# Patient Record
Sex: Male | Born: 1965 | Race: Black or African American | Hispanic: No | Marital: Single | Smoking: Former smoker
Health system: Southern US, Community
[De-identification: ages and names within clinical notes are randomized; demographics above are authoritative.]

## PROBLEM LIST (undated history)

## (undated) DIAGNOSIS — E119 Type 2 diabetes mellitus without complications: Secondary | ICD-10-CM

## (undated) DIAGNOSIS — Z720 Tobacco use: Secondary | ICD-10-CM

## (undated) DIAGNOSIS — F1410 Cocaine abuse, uncomplicated: Secondary | ICD-10-CM

## (undated) DIAGNOSIS — F32A Depression, unspecified: Secondary | ICD-10-CM

## (undated) DIAGNOSIS — F329 Major depressive disorder, single episode, unspecified: Secondary | ICD-10-CM

## (undated) DIAGNOSIS — I5020 Unspecified systolic (congestive) heart failure: Secondary | ICD-10-CM

---

## 2011-12-14 ENCOUNTER — Encounter (HOSPITAL_COMMUNITY): Payer: Self-pay | Admitting: Emergency Medicine

## 2011-12-14 ENCOUNTER — Emergency Department (HOSPITAL_COMMUNITY)
Admission: EM | Admit: 2011-12-14 | Discharge: 2011-12-14 | Disposition: A | Discharge status: Home / Self Care | Payer: Self-pay | Attending: Emergency Medicine | Admitting: Emergency Medicine

## 2011-12-14 DIAGNOSIS — E1169 Type 2 diabetes mellitus with other specified complication: Secondary | ICD-10-CM | POA: Insufficient documentation

## 2011-12-14 DIAGNOSIS — R209 Unspecified disturbances of skin sensation: Secondary | ICD-10-CM | POA: Insufficient documentation

## 2011-12-14 DIAGNOSIS — F17200 Nicotine dependence, unspecified, uncomplicated: Secondary | ICD-10-CM | POA: Insufficient documentation

## 2011-12-14 DIAGNOSIS — R739 Hyperglycemia, unspecified: Secondary | ICD-10-CM

## 2011-12-14 DIAGNOSIS — Z79899 Other long term (current) drug therapy: Secondary | ICD-10-CM | POA: Insufficient documentation

## 2011-12-14 HISTORY — DX: Type 2 diabetes mellitus without complications: E11.9

## 2011-12-14 LAB — COMPREHENSIVE METABOLIC PANEL
ALT: 21 U/L (ref 0–53)
AST: 16 U/L (ref 0–37)
Albumin: 3.6 g/dL (ref 3.5–5.2)
Alkaline Phosphatase: 97 U/L (ref 39–117)
BUN: 16 mg/dL (ref 6–23)
CO2: 26 mEq/L (ref 19–32)
Calcium: 9.2 mg/dL (ref 8.4–10.5)
Chloride: 98 mEq/L (ref 96–112)
Creatinine, Ser: 0.99 mg/dL (ref 0.50–1.35)
GFR calc Af Amer: >90 mL/min (ref >90)
GFR calc non Af Amer: >90 mL/min (ref >90)
Glucose, Bld: 396 mg/dL — ABNORMAL HIGH (ref 70–99)
Potassium: 4 mEq/L (ref 3.5–5.1)
Sodium: 132 mEq/L — ABNORMAL LOW (ref 135–145)
Total Bilirubin: 0.3 mg/dL (ref 0.3–1.2)
Total Protein: 7.1 g/dL (ref 6.0–8.3)

## 2011-12-14 LAB — CBC WITH DIFFERENTIAL/PLATELET
Basophils Absolute: 0 10*3/uL (ref 0.0–0.1)
Basophils Relative: 1 % (ref 0–1)
Eosinophils Absolute: 0.1 10*3/uL (ref 0.0–0.7)
Eosinophils Relative: 1 % (ref 0–5)
HCT: 44 % (ref 39.0–52.0)
Hemoglobin: 14.9 g/dL (ref 13.0–17.0)
Lymphocytes Relative: 36 % (ref 12–46)
Lymphs Abs: 1.5 10*3/uL (ref 0.7–4.0)
MCH: 27.3 pg (ref 26.0–34.0)
MCHC: 33.9 g/dL (ref 30.0–36.0)
MCV: 80.6 fL (ref 78.0–100.0)
Monocytes Absolute: 0.2 10*3/uL (ref 0.1–1.0)
Monocytes Relative: 6 % (ref 3–12)
Neutro Abs: 2.4 10*3/uL (ref 1.7–7.7)
Neutrophils Relative %: 57 % (ref 43–77)
Platelets: 201 10*3/uL (ref 150–400)
RBC: 5.46 MIL/uL (ref 4.22–5.81)
RDW: 12.2 % (ref 11.5–15.5)
WBC: 4.3 10*3/uL (ref 4.0–10.5)

## 2011-12-14 LAB — GLUCOSE, CAPILLARY
Glucose-Capillary: 386 mg/dL — ABNORMAL HIGH (ref 70–99)
Glucose-Capillary: 179 mg/dL — ABNORMAL HIGH (ref 70–99)

## 2011-12-14 MED ORDER — SODIUM CHLORIDE 0.9 % IV BOLUS (SEPSIS)
2000.0000 mL | Freq: Once | INTRAVENOUS | Status: AC
  Administered 2011-12-14: 1000 mL via INTRAVENOUS

## 2011-12-14 MED ORDER — METFORMIN HCL 500 MG PO TABS: 500.0000 mg | ORAL_TABLET | Freq: Two times a day (BID) | ORAL | Status: DC

## 2011-12-14 NOTE — ED Notes (Signed)
Pt ran out of diabetes pills 3 days ago, c/o dizziness at present

## 2011-12-14 NOTE — ED Provider Notes (Signed)
History   This chart was scribed for Doug Sou, MD by Leone Payor, ED Scribe. This patient was seen in room TR07C/TR07C and the patient's care was started at 2:56PM.    CSN: [id number]  Arrival date & time 12/14/11  1309   First MD Initiated Contact with Patient 12/14/11 1456      No chief complaint on file.    The history is provided by the patient. No language interpreter was used.    Kerry Hamilton is a 46 y.o. male who presents to the Emergency Department complaining of hyperglycemia with associated dizziness and tingling in the left small toe that is currently improved. CBG level in the ED is 340. Pt reports running out of diabetes medication (metformin 500 mg 2x days) 4 days ago. Pt complains of polydipsia and is negative for weakness, nausea, vomiting. Pt does not have a current PCP in [place].   Pt is a regular smoker and drinks alcohol (beer) 2x weeks.   Past Medical History  Diagnosis Date  . Diabetes mellitus without complication     History reviewed. No pertinent past surgical history.  No family history on file.  History  Substance Use Topics  . Smoking status: Current Some Day Smoker  . Smokeless tobacco: Not on file  . Alcohol Use: Yes      Review of Systems  Constitutional: Negative.   HENT: Negative.   Respiratory: Negative.   Cardiovascular: Negative.   Gastrointestinal: Negative.   Musculoskeletal: Negative.   Skin: Negative.   Neurological: Positive for dizziness and numbness. Negative for weakness.  Hematological: Negative.   Psychiatric/Behavioral: Negative.     Allergies  Review of patient's allergies indicates not on file.  Home Medications   Current Outpatient Rx  Name  Route  Sig  Dispense  Refill  . METFORMIN HCL 500 MG PO TABS   Oral   Take 500 mg by mouth 2 (two) times daily with a meal.           BP 145/80  Pulse 77  Temp 98.3 F (36.8 C)  SpO2 99%  Physical Exam  Nursing note and vitals  reviewed. Constitutional: He appears well-developed and well-nourished.  HENT:  Head: Normocephalic and atraumatic.  Eyes: Conjunctivae normal are normal. Pupils are equal, round, and reactive to light.  Neck: Neck supple. No tracheal deviation present. No thyromegaly present.  Cardiovascular: Normal rate and regular rhythm.   No murmur heard. Pulmonary/Chest: Effort normal and breath sounds normal.  Abdominal: Soft. Bowel sounds are normal. He exhibits no distension. There is no tenderness.  Musculoskeletal: Normal range of motion. He exhibits no edema and no tenderness.  Neurological: He is alert. Coordination normal.  Skin: Skin is warm and dry. No rash noted.  Psychiatric: He has a normal mood and affect.    ED Course  Procedures (including critical care time)  DIAGNOSTIC STUDIES: Oxygen Saturation is 99% on room air, normal by my interpretation.    COORDINATION OF CARE:  3:10 PM Discussed treatment plan which includes prescription for metformin with pt at bedside and pt agreed to plan.   Labs Reviewed  COMPREHENSIVE METABOLIC PANEL - Abnormal; Notable for the following:    Sodium 132 (*)     Glucose, Bld 396 (*)     All other components within normal limits  GLUCOSE, CAPILLARY - Abnormal; Notable for the following:    Glucose-Capillary 386 (*)     All other components within normal limits  CBC WITH DIFFERENTIAL  URINALYSIS, ROUTINE  W REFLEX MICROSCOPIC   No results found.   No diagnosis found.   Results for orders placed during the hospital encounter of 12/14/11  CBC WITH DIFFERENTIAL      Component Value Range   WBC 4.3  4.0 - 10.5 K/uL   RBC 5.46  4.22 - 5.81 MIL/uL   Hemoglobin 14.9  13.0 - 17.0 g/dL   HCT 57.8  46.9 - 62.9 %   MCV 80.6  78.0 - 100.0 fL   MCH 27.3  26.0 - 34.0 pg   MCHC 33.9  30.0 - 36.0 g/dL   RDW 52.8  41.3 - 24.4 %   Platelets 201  150 - 400 K/uL   Neutrophils Relative 57  43 - 77 %   Neutro Abs 2.4  1.7 - 7.7 K/uL   Lymphocytes  Relative 36  12 - 46 %   Lymphs Abs 1.5  0.7 - 4.0 K/uL   Monocytes Relative 6  3 - 12 %   Monocytes Absolute 0.2  0.1 - 1.0 K/uL   Eosinophils Relative 1  0 - 5 %   Eosinophils Absolute 0.1  0.0 - 0.7 K/uL   Basophils Relative 1  0 - 1 %   Basophils Absolute 0.0  0.0 - 0.1 K/uL  COMPREHENSIVE METABOLIC PANEL      Component Value Range   Sodium 132 (*) 135 - 145 mEq/L   Potassium 4.0  3.5 - 5.1 mEq/L   Chloride 98  96 - 112 mEq/L   CO2 26  19 - 32 mEq/L   Glucose, Bld 396 (*) 70 - 99 mg/dL   BUN 16  6 - 23 mg/dL   Creatinine, Ser 0.10  0.50 - 1.35 mg/dL   Calcium 9.2  8.4 - 27.2 mg/dL   Total Protein 7.1  6.0 - 8.3 g/dL   Albumin 3.6  3.5 - 5.2 g/dL   AST 16  0 - 37 U/L   ALT 21  0 - 53 U/L   Alkaline Phosphatase 97  39 - 117 U/L   Total Bilirubin 0.3  0.3 - 1.2 mg/dL   GFR calc non Af Amer >90  >90 mL/min   GFR calc Af Amer >90  >90 mL/min  GLUCOSE, CAPILLARY      Component Value Range   Glucose-Capillary 386 (*) 70 - 99 mg/dL   No results found.  MDM  Elevated sugar and lightheadedness thirst and tingling due to hyperglycemia as a result of medication noncompliance Plan IV hydration prescription metformin 500 mg twice daily, 30 day supply. Recheck CBC and reassess patient prior to discharge. Patient to be transferred to CDU Diagnosis #1 hyperglycemia #2 dehydration #3 medication noncompliance       I personally performed the services described in this documentation, which was scribed in my presence. The recorded information has been reviewed and is accurate.    Doug Sou, MD 12/14/11 1517

## 2011-12-14 NOTE — ED Notes (Signed)
Ran out of sugar meds last week and feeling weak dizziness and tingling in foot

## 2011-12-14 NOTE — ED Provider Notes (Signed)
Patient's blood sugar reduced to 179 after 2 liters of IV fluids.  Patient feeling better now.  Prescription for one month supply of metformin and resource information provided to patient.  Jimmye Norman, NP 12/14/11 2022

## 2011-12-14 NOTE — ED Provider Notes (Signed)
Medical screening examination/treatment/procedure(s) were conducted as a shared visit with non-physician practitioner(s) and myself.  I personally evaluated the patient during the encounter  Doug Sou, MD 12/14/11 2107

## 2013-04-17 ENCOUNTER — Encounter (HOSPITAL_COMMUNITY): Payer: Self-pay | Admitting: Emergency Medicine

## 2013-04-17 ENCOUNTER — Emergency Department (HOSPITAL_COMMUNITY)
Admission: EM | Admit: 2013-04-17 | Discharge: 2013-04-17 | Disposition: A | Discharge status: Home / Self Care | Payer: Medicaid Other | Attending: Emergency Medicine | Admitting: Emergency Medicine

## 2013-04-17 DIAGNOSIS — F329 Major depressive disorder, single episode, unspecified: Secondary | ICD-10-CM | POA: Diagnosis not present

## 2013-04-17 DIAGNOSIS — Z79899 Other long term (current) drug therapy: Secondary | ICD-10-CM | POA: Insufficient documentation

## 2013-04-17 DIAGNOSIS — F3289 Other specified depressive episodes: Secondary | ICD-10-CM | POA: Insufficient documentation

## 2013-04-17 DIAGNOSIS — F17200 Nicotine dependence, unspecified, uncomplicated: Secondary | ICD-10-CM | POA: Insufficient documentation

## 2013-04-17 DIAGNOSIS — E119 Type 2 diabetes mellitus without complications: Secondary | ICD-10-CM | POA: Insufficient documentation

## 2013-04-17 DIAGNOSIS — F32A Depression, unspecified: Secondary | ICD-10-CM

## 2013-04-17 DIAGNOSIS — Z008 Encounter for other general examination: Secondary | ICD-10-CM | POA: Diagnosis present

## 2013-04-17 HISTORY — DX: Depression, unspecified: F32.A

## 2013-04-17 HISTORY — DX: Major depressive disorder, single episode, unspecified: F32.9

## 2013-04-17 LAB — ETHANOL: Alcohol, Ethyl (B): 59 mg/dL — ABNORMAL HIGH (ref 0–11)

## 2013-04-17 LAB — COMPREHENSIVE METABOLIC PANEL
ALT: 33 U/L (ref 0–53)
AST: 28 U/L (ref 0–37)
Albumin: 4.1 g/dL (ref 3.5–5.2)
Alkaline Phosphatase: 88 U/L (ref 39–117)
BUN: 12 mg/dL (ref 6–23)
CO2: 23 mEq/L (ref 19–32)
Calcium: 9.1 mg/dL (ref 8.4–10.5)
Chloride: 102 mEq/L (ref 96–112)
Creatinine, Ser: 0.91 mg/dL (ref 0.50–1.35)
GFR calc Af Amer: >90 mL/min (ref >90)
GFR calc non Af Amer: >90 mL/min (ref >90)
Glucose, Bld: 117 mg/dL — ABNORMAL HIGH (ref 70–99)
Potassium: 4.1 mEq/L (ref 3.7–5.3)
Sodium: 141 mEq/L (ref 137–147)
Total Bilirubin: 0.3 mg/dL (ref 0.3–1.2)
Total Protein: 7.6 g/dL (ref 6.0–8.3)

## 2013-04-17 LAB — CBC
HCT: 43.7 % (ref 39.0–52.0)
Hemoglobin: 14.3 g/dL (ref 13.0–17.0)
MCH: 27.5 pg (ref 26.0–34.0)
MCHC: 32.7 g/dL (ref 30.0–36.0)
MCV: 84 fL (ref 78.0–100.0)
Platelets: 236 10*3/uL (ref 150–400)
RBC: 5.2 MIL/uL (ref 4.22–5.81)
RDW: 13.5 % (ref 11.5–15.5)
WBC: 4.9 10*3/uL (ref 4.0–10.5)

## 2013-04-17 LAB — RAPID URINE DRUG SCREEN, HOSP PERFORMED
Amphetamines: NOT DETECTED
Barbiturates: NOT DETECTED
Benzodiazepines: NOT DETECTED
Cocaine: POSITIVE — AB
Opiates: NOT DETECTED
Tetrahydrocannabinol: NOT DETECTED

## 2013-04-17 LAB — SALICYLATE LEVEL: Salicylate Lvl: <2 mg/dL — ABNORMAL LOW (ref 2.8–20.0)

## 2013-04-17 LAB — ACETAMINOPHEN LEVEL: Acetaminophen (Tylenol), Serum: <15 ug/mL (ref 10–30)

## 2013-04-17 MED ORDER — LURASIDONE HCL 40 MG PO TABS
40.0000 mg | ORAL_TABLET | Freq: Every day | ORAL | Status: DC
  Administered 2013-04-17: 40 mg via ORAL
  Filled 2013-04-17 (×2): qty 1

## 2013-04-17 NOTE — ED Notes (Signed)
Pt was arguing with sister who apparently is on drugs and called 911 stating that he wanted to run out into traffic because he didn't know what to do with himself.

## 2013-04-17 NOTE — ED Notes (Signed)
Patient resting in position of comfort with eyes closed RR WNL--even and unlabored with equal rise and fall of chest Patient in NAD Side rails up, call bell in reach  

## 2013-04-17 NOTE — Discharge Instructions (Signed)
Depression, Adult °Depression is feeling sad, low, down in the dumps, blue, gloomy, or empty. In general, there are two kinds of depression: °· Normal sadness or grief. This can happen after something upsetting. It often goes away on its own within 2 weeks. After losing a loved one (bereavement), normal sadness and grief may last longer than two weeks. It usually gets better with time. °· Clinical depression. This kind lasts longer than normal sadness or grief. It keeps you from doing the things you normally do in life. It is often hard to function at home, work, or at school. It may affect your relationships with others. Treatment is often needed. °GET HELP RIGHT AWAY IF: °· You have thoughts about hurting yourself or others. °· You lose touch with reality (psychotic symptoms). You may: °· See or hear things that are not real. °· Have untrue beliefs about your life or people around you. °· Your medicine is giving you problems. °MAKE SURE YOU: °· Understand these instructions. °· Will watch your condition. °· Will get help right away if you are not doing well or get worse. °Document Released: 02/13/2010 Document Revised: 10/06/2011 Document Reviewed: 05/13/2011 °[employer]® Patient Information ©2014 [employer]. ° °

## 2013-04-17 NOTE — ED Provider Notes (Signed)
CSN: [id number]     Arrival date & time 04/17/13  0048 History   First MD Initiated Contact with Patient 04/17/13 0344     Chief Complaint  Patient presents with  . Medical Clearance     (Consider location/radiation/quality/duration/timing/severity/associated sxs/prior Treatment) HPI  Patient is a 48 year old man with history substance abuse is  also diabetic. The patient called 911 after he became agitated following an argument with his sister and his sister's boyfriend. He lives with a couple. He says he became so agitated, frustrated and upset that he felt like walking into traffic. He no longer feels suicidal. He is without physical complaints. Denies hallucinations. Past Medical History  Diagnosis Date  . Diabetes mellitus without complication   . Depression    No past surgical history on file. No family history on file. History  Substance Use Topics  . Smoking status: Current Some Day Smoker  . Smokeless tobacco: Not on file  . Alcohol Use: 2.4 oz/week    4 Cans of beer per week    Review of Systems Ten point review of symptoms performed and is negative with the exception of symptoms noted above.     Allergies  Review of patient's allergies indicates no known allergies.  Home Medications   Current Outpatient Rx  Name  Route  Sig  Dispense  Refill  . metFORMIN (GLUCOPHAGE) 500 MG tablet   Oral   Take 500 mg by mouth 2 (two) times daily with a meal.          BP 157/85  Pulse 65  Temp(Src) 98.7 F (37.1 C) (Oral)  Resp 18  SpO2 100% Physical Exam Gen: well developed and well nourished appearing Head: NCAT Eyes: PERL, EOMI Nose: no epistaixis or rhinorrhea Mouth/throat: mucosa is moist and pink Neck: supple, no stridor Lungs: CTA B, no wheezing, rhonchi or rales CV: RRR, no murmur, extremities appear well perfused.  Abd: soft, notender, nondistended Back: no ttp, no cva ttp Skin: warm and dry Ext: normal to inspection, no dependent edema Neuro: CN  ii-xii grossly intact, no focal deficits Psyche; normal affect,  calm and cooperative.   ED Course  Procedures (including critical care time) Labs Review Labs Reviewed  COMPREHENSIVE METABOLIC PANEL - Abnormal; Notable for the following:    Glucose, Bld 117 (*)    All other components within normal limits  ETHANOL - Abnormal; Notable for the following:    Alcohol, Ethyl (B) 59 (*)    All other components within normal limits  SALICYLATE LEVEL - Abnormal; Notable for the following:    Salicylate Lvl <2.0 (*)    All other components within normal limits  URINE RAPID DRUG SCREEN (HOSP PERFORMED) - Abnormal; Notable for the following:    Cocaine POSITIVE (*)    All other components within normal limits  CBC  ACETAMINOPHEN LEVEL     MDM   Patient with situational depression. Medically stable.     Brandt Loosen, MD 04/18/13 [phone]

## 2013-04-17 NOTE — ED Notes (Signed)
Assumed care of patient Patient belongings have already been searched via wand by security staff Patient in blue scrubs, per protocol Patient awaiting EDP

## 2015-03-19 ENCOUNTER — Encounter (HOSPITAL_COMMUNITY): Payer: Self-pay | Admitting: Adult Health

## 2015-03-19 ENCOUNTER — Emergency Department (HOSPITAL_COMMUNITY): Payer: Medicaid Other

## 2015-03-19 ENCOUNTER — Emergency Department (HOSPITAL_COMMUNITY)
Admission: EM | Admit: 2015-03-19 | Discharge: 2015-03-19 | Disposition: A | Discharge status: Home / Self Care | Payer: Medicaid Other | Attending: Emergency Medicine | Admitting: Emergency Medicine

## 2015-03-19 DIAGNOSIS — Y9289 Other specified places as the place of occurrence of the external cause: Secondary | ICD-10-CM | POA: Insufficient documentation

## 2015-03-19 DIAGNOSIS — Z8659 Personal history of other mental and behavioral disorders: Secondary | ICD-10-CM | POA: Insufficient documentation

## 2015-03-19 DIAGNOSIS — E119 Type 2 diabetes mellitus without complications: Secondary | ICD-10-CM | POA: Insufficient documentation

## 2015-03-19 DIAGNOSIS — S82831A Other fracture of upper and lower end of right fibula, initial encounter for closed fracture: Secondary | ICD-10-CM | POA: Diagnosis not present

## 2015-03-19 DIAGNOSIS — Y9389 Activity, other specified: Secondary | ICD-10-CM | POA: Diagnosis not present

## 2015-03-19 DIAGNOSIS — Y998 Other external cause status: Secondary | ICD-10-CM | POA: Insufficient documentation

## 2015-03-19 DIAGNOSIS — F17200 Nicotine dependence, unspecified, uncomplicated: Secondary | ICD-10-CM | POA: Insufficient documentation

## 2015-03-19 DIAGNOSIS — S82401A Unspecified fracture of shaft of right fibula, initial encounter for closed fracture: Secondary | ICD-10-CM

## 2015-03-19 DIAGNOSIS — W1781XA Fall down embankment (hill), initial encounter: Secondary | ICD-10-CM | POA: Diagnosis not present

## 2015-03-19 DIAGNOSIS — Z7984 Long term (current) use of oral hypoglycemic drugs: Secondary | ICD-10-CM | POA: Diagnosis not present

## 2015-03-19 DIAGNOSIS — S99911A Unspecified injury of right ankle, initial encounter: Secondary | ICD-10-CM | POA: Diagnosis present

## 2015-03-19 MED ORDER — HYDROCODONE-ACETAMINOPHEN 5-325 MG PO TABS
ORAL_TABLET | ORAL | Status: AC
  Filled 2015-03-19: qty 1

## 2015-03-19 MED ORDER — IBUPROFEN 200 MG PO TABS
ORAL_TABLET | ORAL | Status: AC
  Filled 2015-03-19: qty 1

## 2015-03-19 MED ORDER — IBUPROFEN 600 MG PO TABS: 600.0000 mg | ORAL_TABLET | Freq: Three times a day (TID) | ORAL | Status: DC | PRN

## 2015-03-19 MED ORDER — HYDROCODONE-ACETAMINOPHEN 5-325 MG PO TABS
1.0000 | ORAL_TABLET | Freq: Once | ORAL | Status: AC
  Administered 2015-03-19: 1 via ORAL

## 2015-03-19 MED ORDER — IBUPROFEN 400 MG PO TABS
ORAL_TABLET | ORAL | Status: AC
  Filled 2015-03-19: qty 1

## 2015-03-19 MED ORDER — IBUPROFEN 400 MG PO TABS
600.0000 mg | ORAL_TABLET | Freq: Once | ORAL | Status: AC
  Administered 2015-03-19: 600 mg via ORAL

## 2015-03-19 NOTE — ED Notes (Signed)
presents with 2 months of right ankle swelling and pain.  2 months ago pt slid down a hill and heard his ankle crack, it began to swell but went down , it has continued to swell and go down over the last 2 months. Right ankle is warm to touch, CMS intact, swollen. He was not treated 2 months ago-.

## 2015-03-19 NOTE — Progress Notes (Signed)
Orthopedic Tech Progress Note Patient Details:  Kerry Hamilton 1965-04-24 [id number]  Ortho Devices Type of Ortho Device: CAM walker, Crutches Ortho Device/Splint Location: rle Ortho Device/Splint Interventions: Ordered, Application   Trinna Post 03/19/2015, 5:33 AM

## 2015-03-19 NOTE — ED Provider Notes (Signed)
CSN: [id number]     Arrival date & time 03/19/15  0250 History   First MD Initiated Contact with Patient 03/19/15 [phone]     Chief Complaint  Patient presents with  . Ankle Injury      HPI Reports right ankle pain since December when he originally "sprained" his ankle. Presents with ongoing pain with ambulation. Has not seen a physician yet about his ankle. No other complaints. Reports waxing and waning swelling. Pain is moderate in severity    Past Medical History  Diagnosis Date  . Diabetes mellitus without complication (HCC)   . Depression    History reviewed. No pertinent past surgical history. History reviewed. No pertinent family history. Social History  Substance Use Topics  . Smoking status: Current Some Day Smoker  . Smokeless tobacco: None  . Alcohol Use: 2.4 oz/week    4 Cans of beer per week    Review of Systems  All other systems reviewed and are negative.     Allergies  Review of patient's allergies indicates no known allergies.  Home Medications   Prior to Admission medications   Medication Sig Start Date End Date Taking? Authorizing Provider  ibuprofen (ADVIL,MOTRIN) 600 MG tablet Take 1 tablet (600 mg total) by mouth every 8 (eight) hours as needed. 03/19/15   Azalia Bilis, MD  metFORMIN (GLUCOPHAGE) 500 MG tablet Take 500 mg by mouth 2 (two) times daily with a meal.    Historical Provider, MD   BP 138/77 mmHg  Pulse 100  Temp(Src) 98 F (36.7 C) (Oral)  Resp 18  Ht 6' (1.829 m)  Wt 181 lb 2 oz (82.158 kg)  BMI 24.56 kg/m2  SpO2 98% Physical Exam  Constitutional: He is oriented to person, place, and time. He appears well-developed and well-nourished.  HENT:  Head: Normocephalic.  Eyes: EOM are normal.  Neck: Normal range of motion.  Pulmonary/Chest: Effort normal.  Abdominal: He exhibits no distension.  Musculoskeletal: Normal range of motion.  Mild swelling right ankle. Normal right foot pulses. Compartments soft  Neurological: He is  alert and oriented to person, place, and time.  Psychiatric: He has a normal mood and affect.  Nursing note and vitals reviewed.   ED Course  Procedures (including critical care time) Labs Review Labs Reviewed - No data to display  Imaging Review Dg Ankle Complete Right  03/19/2015  CLINICAL DATA:  Right lateral ankle pain and swelling, after injury in December. Initial encounter. EXAM: RIGHT ANKLE - COMPLETE 3+ VIEW COMPARISON:  None. FINDINGS: There is a healing mildly displaced fracture of the distal fibula, with mild lateral displacement and overlying callus formation. The fracture line is still partially evident on the lateral view. There is medial widening of the ankle mortise, reflecting the chronic mild lateral displacement of the distal fibula and associated instability. Soft tissue swelling is noted about the ankle. Plantar and posterior calcaneal spurs are seen. No additional fractures are identified. IMPRESSION: Healing mildly displaced fracture of the distal fibula, with mild lateral displacement and overlying callus formation. The fracture line is still partially evident on the lateral view. 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 Electronically Signed   By: Roanna Raider M.D.   On: 03/19/2015 04:04   I have personally reviewed and evaluated these images and lab results as part of my medical decision-making.   EKG Interpretation None      MDM   Final diagnoses:  Right fibular fracture, closed, initial encounter    Cam walker and crutches. Ortho follow up    Azalia Bilis, MD 03/19/15 [phone]

## 2015-03-19 NOTE — Discharge Instructions (Signed)
Fibular Ankle Fracture Treated With or Without Immobilization, Adult °A fibular fracture at your ankle is a break (fracture) bone in the smallest of the two bones in your lower leg, located on the outside of your leg (fibula) close to the area at your ankle joint. °CAUSES °· Rolling your ankle. °· Twisting your ankle. °· Extreme flexing or extending of your foot. °· Severe force on your ankle as when falling from a distance. °RISK FACTORS °· Jumping activities. °· Participation in sports. °· Osteoporosis. °· Advanced age. °· Previous ankle injuries. °SIGNS AND SYMPTOMS °· Pain. °· Swelling. °· Inability to put weight on injured ankle. °· Bruising. °· Bone deformities at site of injury. °DIAGNOSIS  °This fracture is diagnosed with the help of an X-ray exam. °TREATMENT  °If the fractured bone did not move out of place it usually will heal without problems and does casting or splinting. If immobilization is needed for comfort or the fractured bone moved out of place and will not heal properly with immobilization, a cast or splint will be used. °HOME CARE INSTRUCTIONS  °· Apply ice to the area of injury: °¨ Put ice in a plastic bag. °¨ Place a towel between your skin and the bag. °¨ Leave the ice on for 20 minutes, 2-3 times a day. °· Use crutches as directed. Resume walking without crutches as directed by your health care provider. °· Only take over-the-counter or prescription medicines for pain, discomfort, or fever as directed by your health care provider. °· If you have a removable splint or boot, do not remove the boot unless directed by your health care provider. °SEEK MEDICAL CARE IF:  °· You have continued pain or more swelling °· The medications do not control the pain. °SEEK IMMEDIATE MEDICAL CARE IF: °· You develop severe pain in the leg or foot. °· Your skin or nails below the injury turn blue or grey or feel cold or numb. °MAKE SURE YOU:  °· Understand these instructions. °· Will watch your  condition. °· Will get help right away if you are not doing well or get worse. °  °This information is not intended to replace advice given to you by your health care provider. Make sure you discuss any questions you have with your health care provider. °  °Document Released: 01/11/2005 Document Revised: 02/01/2014 Document Reviewed: 08/23/2012 °Elsevier Interactive Patient Education ©2016 [employer] ° °

## 2018-06-10 ENCOUNTER — Inpatient Hospital Stay (HOSPITAL_COMMUNITY)
Admission: EM | Admit: 2018-06-10 | Discharge: 2018-06-16 | DRG: 286 | Disposition: A | Discharge status: Home Health | Payer: Medicaid Other | Attending: Internal Medicine | Admitting: Internal Medicine

## 2018-06-10 ENCOUNTER — Emergency Department (HOSPITAL_COMMUNITY): Payer: Medicaid Other

## 2018-06-10 ENCOUNTER — Encounter (HOSPITAL_COMMUNITY): Payer: Self-pay | Admitting: Emergency Medicine

## 2018-06-10 ENCOUNTER — Other Ambulatory Visit: Payer: Self-pay

## 2018-06-10 DIAGNOSIS — N179 Acute kidney failure, unspecified: Secondary | ICD-10-CM | POA: Diagnosis present

## 2018-06-10 DIAGNOSIS — F32A Depression, unspecified: Secondary | ICD-10-CM | POA: Diagnosis present

## 2018-06-10 DIAGNOSIS — Z7289 Other problems related to lifestyle: Secondary | ICD-10-CM | POA: Diagnosis not present

## 2018-06-10 DIAGNOSIS — I110 Hypertensive heart disease with heart failure: Secondary | ICD-10-CM | POA: Diagnosis present

## 2018-06-10 DIAGNOSIS — I509 Heart failure, unspecified: Secondary | ICD-10-CM

## 2018-06-10 DIAGNOSIS — R9431 Abnormal electrocardiogram [ECG] [EKG]: Secondary | ICD-10-CM

## 2018-06-10 DIAGNOSIS — F209 Schizophrenia, unspecified: Secondary | ICD-10-CM | POA: Diagnosis present

## 2018-06-10 DIAGNOSIS — Z79899 Other long term (current) drug therapy: Secondary | ICD-10-CM

## 2018-06-10 DIAGNOSIS — I420 Dilated cardiomyopathy: Secondary | ICD-10-CM | POA: Diagnosis present

## 2018-06-10 DIAGNOSIS — I081 Rheumatic disorders of both mitral and tricuspid valves: Secondary | ICD-10-CM | POA: Diagnosis present

## 2018-06-10 DIAGNOSIS — I340 Nonrheumatic mitral (valve) insufficiency: Secondary | ICD-10-CM | POA: Diagnosis not present

## 2018-06-10 DIAGNOSIS — I249 Acute ischemic heart disease, unspecified: Secondary | ICD-10-CM | POA: Diagnosis present

## 2018-06-10 DIAGNOSIS — E119 Type 2 diabetes mellitus without complications: Secondary | ICD-10-CM | POA: Diagnosis present

## 2018-06-10 DIAGNOSIS — R1013 Epigastric pain: Secondary | ICD-10-CM | POA: Diagnosis present

## 2018-06-10 DIAGNOSIS — F1910 Other psychoactive substance abuse, uncomplicated: Secondary | ICD-10-CM | POA: Diagnosis not present

## 2018-06-10 DIAGNOSIS — R195 Other fecal abnormalities: Secondary | ICD-10-CM | POA: Diagnosis present

## 2018-06-10 DIAGNOSIS — R778 Other specified abnormalities of plasma proteins: Secondary | ICD-10-CM

## 2018-06-10 DIAGNOSIS — I5021 Acute systolic (congestive) heart failure: Secondary | ICD-10-CM | POA: Diagnosis present

## 2018-06-10 DIAGNOSIS — I214 Non-ST elevation (NSTEMI) myocardial infarction: Secondary | ICD-10-CM

## 2018-06-10 DIAGNOSIS — R0602 Shortness of breath: Secondary | ICD-10-CM

## 2018-06-10 DIAGNOSIS — Z1159 Encounter for screening for other viral diseases: Secondary | ICD-10-CM

## 2018-06-10 DIAGNOSIS — F1490 Cocaine use, unspecified, uncomplicated: Secondary | ICD-10-CM | POA: Diagnosis present

## 2018-06-10 DIAGNOSIS — F329 Major depressive disorder, single episode, unspecified: Secondary | ICD-10-CM | POA: Diagnosis present

## 2018-06-10 DIAGNOSIS — R945 Abnormal results of liver function studies: Secondary | ICD-10-CM | POA: Diagnosis not present

## 2018-06-10 DIAGNOSIS — F17210 Nicotine dependence, cigarettes, uncomplicated: Secondary | ICD-10-CM | POA: Diagnosis present

## 2018-06-10 DIAGNOSIS — R7989 Other specified abnormal findings of blood chemistry: Secondary | ICD-10-CM | POA: Diagnosis not present

## 2018-06-10 DIAGNOSIS — I361 Nonrheumatic tricuspid (valve) insufficiency: Secondary | ICD-10-CM | POA: Diagnosis not present

## 2018-06-10 DIAGNOSIS — I4510 Unspecified right bundle-branch block: Secondary | ICD-10-CM | POA: Diagnosis present

## 2018-06-10 LAB — COMPREHENSIVE METABOLIC PANEL
ALT: 87 U/L — ABNORMAL HIGH (ref 0–44)
AST: 65 U/L — ABNORMAL HIGH (ref 15–41)
Albumin: 3.5 g/dL (ref 3.5–5.0)
Alkaline Phosphatase: 186 U/L — ABNORMAL HIGH (ref 38–126)
Anion gap: 9 (ref 5–15)
BUN: 25 mg/dL — ABNORMAL HIGH (ref 6–20)
CO2: 20 mmol/L — ABNORMAL LOW (ref 22–32)
Calcium: 8.7 mg/dL — ABNORMAL LOW (ref 8.9–10.3)
Chloride: 110 mmol/L (ref 98–111)
Creatinine, Ser: 1.33 mg/dL — ABNORMAL HIGH (ref 0.61–1.24)
GFR calc Af Amer: >60 mL/min (ref >60)
GFR calc non Af Amer: >60 mL/min (ref >60)
Glucose, Bld: 171 mg/dL — ABNORMAL HIGH (ref 70–99)
Potassium: 4 mmol/L (ref 3.5–5.1)
Sodium: 139 mmol/L (ref 135–145)
Total Bilirubin: 0.7 mg/dL (ref 0.3–1.2)
Total Protein: 6.2 g/dL — ABNORMAL LOW (ref 6.5–8.1)

## 2018-06-10 LAB — LACTIC ACID, PLASMA: Lactic Acid, Venous: 1.6 mmol/L (ref 0.5–1.9)

## 2018-06-10 LAB — URINALYSIS, ROUTINE W REFLEX MICROSCOPIC
Bacteria, UA: NONE SEEN
Bilirubin Urine: NEGATIVE
Glucose, UA: NEGATIVE mg/dL
Hgb urine dipstick: NEGATIVE
Ketones, ur: NEGATIVE mg/dL
Leukocytes,Ua: NEGATIVE
Nitrite: NEGATIVE
Protein, ur: 100 mg/dL — AB
Specific Gravity, Urine: 1.032 — ABNORMAL HIGH (ref 1.005–1.030)
pH: 5 (ref 5.0–8.0)

## 2018-06-10 LAB — CBC WITH DIFFERENTIAL/PLATELET
Abs Immature Granulocytes: 0.01 10*3/uL (ref 0.00–0.07)
Basophils Absolute: 0.1 10*3/uL (ref 0.0–0.1)
Basophils Relative: 1 %
Eosinophils Absolute: 0.1 10*3/uL (ref 0.0–0.5)
Eosinophils Relative: 1 %
HCT: 41.1 % (ref 39.0–52.0)
Hemoglobin: 13.4 g/dL (ref 13.0–17.0)
Immature Granulocytes: 0 %
Lymphocytes Relative: 43 %
Lymphs Abs: 2.1 10*3/uL (ref 0.7–4.0)
MCH: 28.1 pg (ref 26.0–34.0)
MCHC: 32.6 g/dL (ref 30.0–36.0)
MCV: 86.2 fL (ref 80.0–100.0)
Monocytes Absolute: 0.5 10*3/uL (ref 0.1–1.0)
Monocytes Relative: 10 %
Neutro Abs: 2.2 10*3/uL (ref 1.7–7.7)
Neutrophils Relative %: 45 %
Platelets: 238 10*3/uL (ref 150–400)
RBC: 4.77 MIL/uL (ref 4.22–5.81)
RDW: 13.9 % (ref 11.5–15.5)
WBC: 5 10*3/uL (ref 4.0–10.5)
nRBC: 0 % (ref 0.0–0.2)

## 2018-06-10 LAB — TYPE AND SCREEN
ABO/RH(D): B POS
Antibody Screen: NEGATIVE

## 2018-06-10 LAB — SARS CORONAVIRUS 2 BY RT PCR (HOSPITAL ORDER, PERFORMED IN ~~LOC~~ HOSPITAL LAB): SARS Coronavirus 2: NEGATIVE

## 2018-06-10 LAB — LIPASE, BLOOD: Lipase: 29 U/L (ref 11–51)

## 2018-06-10 LAB — CBG MONITORING, ED: Glucose-Capillary: 95 mg/dL (ref 70–99)

## 2018-06-10 LAB — TROPONIN I
Troponin I: 0.51 ng/mL (ref <0.03)
Troponin I: 0.41 ng/mL (ref <0.03)

## 2018-06-10 LAB — BRAIN NATRIURETIC PEPTIDE: B Natriuretic Peptide: 1634.1 pg/mL — ABNORMAL HIGH (ref 0.0–100.0)

## 2018-06-10 LAB — ABO/RH: ABO/RH(D): B POS

## 2018-06-10 LAB — POC OCCULT BLOOD, ED: Fecal Occult Bld: NEGATIVE

## 2018-06-10 LAB — D-DIMER, QUANTITATIVE: D-Dimer, Quant: 0.81 ug/mL-FEU — ABNORMAL HIGH (ref 0.00–0.50)

## 2018-06-10 MED ORDER — HEPARIN BOLUS VIA INFUSION
4000.0000 [IU] | Freq: Once | INTRAVENOUS | Status: AC
  Administered 2018-06-10: 4000 [IU] via INTRAVENOUS
  Filled 2018-06-10: qty 4000

## 2018-06-10 MED ORDER — NITROGLYCERIN 0.4 MG SL SUBL
0.4000 mg | SUBLINGUAL_TABLET | SUBLINGUAL | Status: DC | PRN
  Administered 2018-06-10 (×3): 0.4 mg via SUBLINGUAL
  Filled 2018-06-10: qty 1

## 2018-06-10 MED ORDER — HEPARIN (PORCINE) 25000 UT/250ML-% IV SOLN
1300.0000 [IU]/h | INTRAVENOUS | Status: DC
  Administered 2018-06-10: 1000 [IU]/h via INTRAVENOUS
  Administered 2018-06-11 – 2018-06-12 (×2): 1300 [IU]/h via INTRAVENOUS
  Filled 2018-06-10 (×4): qty 250

## 2018-06-10 MED ORDER — INSULIN ASPART 100 UNIT/ML ~~LOC~~ SOLN
0.0000 [IU] | SUBCUTANEOUS | Status: DC
  Administered 2018-06-11 – 2018-06-12 (×3): 2 [IU] via SUBCUTANEOUS
  Administered 2018-06-12: 1 [IU] via SUBCUTANEOUS
  Administered 2018-06-12: 2 [IU] via SUBCUTANEOUS
  Administered 2018-06-12 – 2018-06-13 (×3): 3 [IU] via SUBCUTANEOUS
  Administered 2018-06-13: 1 [IU] via SUBCUTANEOUS
  Administered 2018-06-14: 2 [IU] via SUBCUTANEOUS
  Administered 2018-06-14: 3 [IU] via SUBCUTANEOUS
  Administered 2018-06-14: 1 [IU] via SUBCUTANEOUS

## 2018-06-10 MED ORDER — ASPIRIN 81 MG PO CHEW
324.0000 mg | CHEWABLE_TABLET | Freq: Once | ORAL | Status: AC
  Administered 2018-06-10: 324 mg via ORAL
  Filled 2018-06-10: qty 4

## 2018-06-10 MED ORDER — ASPIRIN EC 81 MG PO TBEC
81.0000 mg | DELAYED_RELEASE_TABLET | Freq: Every day | ORAL | Status: DC
  Administered 2018-06-11 – 2018-06-16 (×5): 81 mg via ORAL
  Filled 2018-06-10 (×6): qty 1

## 2018-06-10 MED ORDER — ONDANSETRON HCL 4 MG/2ML IJ SOLN: 4.0000 mg | Freq: Four times a day (QID) | INTRAMUSCULAR | Status: DC | PRN

## 2018-06-10 MED ORDER — ATORVASTATIN CALCIUM 40 MG PO TABS
40.0000 mg | ORAL_TABLET | Freq: Every day | ORAL | Status: DC
  Administered 2018-06-11 – 2018-06-12 (×2): 40 mg via ORAL
  Filled 2018-06-10 (×2): qty 1

## 2018-06-10 MED ORDER — ACETAMINOPHEN 325 MG PO TABS
650.0000 mg | ORAL_TABLET | ORAL | Status: DC | PRN
  Administered 2018-06-13 – 2018-06-15 (×2): 650 mg via ORAL
  Filled 2018-06-10 (×2): qty 2

## 2018-06-10 MED ORDER — SODIUM CHLORIDE 0.9 % IV BOLUS
500.0000 mL | Freq: Once | INTRAVENOUS | Status: AC
  Administered 2018-06-10: 500 mL via INTRAVENOUS

## 2018-06-10 NOTE — ED Notes (Signed)
Date and time results received: 06/10/18 2218 (use smartphrase ".now" to insert current time)  Test: troponin Critical Value: 0.41  Name of Provider Notified: Candis Shine  Orders Received? Or Actions Taken?: notified Candis Shine of troponin of 0.41 so she can inform admitting doctor of result.

## 2018-06-10 NOTE — ED Provider Notes (Signed)
[facility] DEPT Provider Note   CSN: [id number] Arrival date & time: 06/10/18  1604    History   Chief Complaint Chief Complaint  Patient presents with  . Abdominal Pain  . Shortness of Breath  . Cough  . dark stool    HPI Kerry Hamilton is a 53 y.o. male with a past medical history of DM, depression, who presents today for evaluation of multiple complaints. He reports that for 6 days he has had dark tarry sticky bowel movements.  3 days ago he developed epigastric abdominal pain that does not radiate or move.  It is not changed with eating.  He has attempted Pepto without significant relief.  He did start having dark tarry stools before he started the Pepto.  He denies any nausea or vomiting.  No diarrhea.  He reports that he has had shortness of breath since today.  He reports he started having a dry cough yesterday.  He denies any known sick contacts.  He has not had any fevers at home.  He denies leg swelling, hemoptysis, history of DVT or chest pain.      HPI  Past Medical History:  Diagnosis Date  . Depression   . Diabetes mellitus without complication [facility])     Patient Active Problem List   Diagnosis Date Noted  . ACS (acute coronary syndrome) ([place]) 06/10/2018  . DM2 (diabetes mellitus, type 2) ([place]) 06/10/2018  . Depression 06/10/2018    History reviewed. No pertinent surgical history.      Home Medications    Prior to Admission medications   Medication Sig Start Date End Date Taking? Authorizing Provider  QUEtiapine (SEROQUEL) 100 MG tablet Take 100 mg by mouth at bedtime. 02/21/18  Yes [provider]  risperiDONE (RISPERDAL) 3 MG tablet Take 3 mg by mouth at bedtime. 02/21/18  Yes [provider]    Family History History reviewed. No pertinent family history.  Social History Social History   Tobacco Use  . Smoking status: Current Some Day Smoker    Types: Cigarettes  . Smokeless tobacco: Never Used   Substance Use Topics  . Alcohol use: Yes    Alcohol/week: 4.0 standard drinks    Types: 4 Cans of beer per week  . Drug use: No     Allergies   Patient has no known allergies.   Review of Systems Review of Systems  Constitutional: Negative for chills and fever.  HENT: Negative for congestion.   Respiratory: Positive for cough and shortness of breath.   Cardiovascular: Negative for chest pain, palpitations and leg swelling.  Gastrointestinal: Positive for abdominal pain. Negative for diarrhea, nausea and vomiting.  Genitourinary: Negative for dysuria and urgency.  Musculoskeletal: Negative for back pain and neck pain.  Skin: Negative for color change, rash and wound.  Neurological: Negative for weakness, light-headedness and headaches.  Psychiatric/Behavioral: Negative for confusion.  All other systems reviewed and are negative.    Physical Exam Updated Vital Signs BP (!) 131/97   Pulse 90 Comment: Simultaneous filing. User may not have seen previous data.  Temp 99 F (37.2 C) (Oral)   Resp (!) 22   SpO2 95% Comment: Simultaneous filing. User may not have seen previous data.  Physical Exam Vitals signs and nursing note reviewed. Exam conducted with a chaperone present Kandice Moos, patient's RN. ).  Constitutional:      Appearance: He is well-developed.  HENT:     Head: Normocephalic and atraumatic.     Mouth/Throat:  Mouth: Mucous membranes are moist.  Eyes:     Conjunctiva/sclera: Conjunctivae normal.  Neck:     Musculoskeletal: Full passive range of motion without pain, normal range of motion and neck supple. No neck rigidity.     Vascular: No JVD.  Cardiovascular:     Rate and Rhythm: Normal rate and regular rhythm.     Heart sounds: Normal heart sounds. No murmur.  Pulmonary:     Effort: Pulmonary effort is normal. Tachypnea present.     Breath sounds: Normal breath sounds. No decreased breath sounds.  Abdominal:     General: Abdomen is protuberant.      Palpations: Abdomen is soft.     Tenderness: There is abdominal tenderness in the right upper quadrant and epigastric area. There is guarding. There is no rebound. Negative signs include Murphy's sign.     Hernia: No hernia is present.  Genitourinary:    Rectum: Normal. No mass or tenderness.  Musculoskeletal:     Right lower leg: He exhibits no tenderness. No edema.     Left lower leg: He exhibits no tenderness. No edema.  Skin:    General: Skin is warm and dry.  Neurological:     General: No focal deficit present.     Mental Status: He is alert.     Cranial Nerves: No cranial nerve deficit.  Psychiatric:        Mood and Affect: Mood normal.        Behavior: Behavior normal.      ED Treatments / Results  Labs (all labs ordered are listed, but only abnormal results are displayed) Labs Reviewed  COMPREHENSIVE METABOLIC PANEL - Abnormal; Notable for the following components:      Result Value   CO2 20 (*)    Glucose, Bld 171 (*)    BUN 25 (*)    Creatinine, Ser 1.33 (*)    Calcium 8.7 (*)    Total Protein 6.2 (*)    AST 65 (*)    ALT 87 (*)    Alkaline Phosphatase 186 (*)    All other components within normal limits  URINALYSIS, ROUTINE W REFLEX MICROSCOPIC - Abnormal; Notable for the following components:   Specific Gravity, Urine 1.032 (*)    Protein, ur 100 (*)    All other components within normal limits  TROPONIN I - Abnormal; Notable for the following components:   Troponin I 0.51 (*)    All other components within normal limits  TROPONIN I - Abnormal; Notable for the following components:   Troponin I 0.41 (*)    All other components within normal limits  BRAIN NATRIURETIC PEPTIDE - Abnormal; Notable for the following components:   B Natriuretic Peptide 1,634.1 (*)    All other components within normal limits  D-DIMER, QUANTITATIVE (NOT AT [facility]) - Abnormal; Notable for the following components:   D-Dimer, Quant 0.81 (*)    All other components within normal  limits  SARS CORONAVIRUS 2 (HOSPITAL ORDER, [facility])  CULTURE, BLOOD (ROUTINE X 2)  CULTURE, BLOOD (ROUTINE X 2)  LIPASE, BLOOD  CBC WITH DIFFERENTIAL/PLATELET  LACTIC ACID, PLASMA  LACTIC ACID, PLASMA  HIV ANTIBODY (ROUTINE TESTING W REFLEX)  TROPONIN I  TROPONIN I  HEPARIN LEVEL (UNFRACTIONATED)  CBC  POC OCCULT BLOOD, ED  CBG MONITORING, ED  TYPE AND SCREEN  ABO/RH    EKG EKG Interpretation  Date/Time:  Saturday Jun 10 2018 16:15:35 EDT Ventricular Rate:  91 PR  Interval:    QRS Duration: 154 QT Interval:  422 QTC Calculation: 520 R Axis:   102 Text Interpretation:  Sinus rhythm Left atrial enlargement Right bundle branch block No old tracing to compare Confirmed by Jola Schmidt [id number]) on 06/10/2018 8:10:47 PM   Radiology Dg Chest Port 1 View  Result Date: 06/10/2018 CLINICAL DATA:  53 year old with one-week history of cough, shortness of breath, generalized abdominal pain and dark stools. EXAM: PORTABLE CHEST 1 VIEW COMPARISON:  None. FINDINGS: Cardiac silhouette markedly enlarged. Pulmonary vascularity normal. Streaky and patchy airspace opacities at the LEFT lung base. Lungs otherwise clear. No visible pleural effusions. IMPRESSION: 1. Atelectasis and/or bronchopneumonia involving the LEFT lung base. 2. Marked cardiomegaly without pulmonary edema. Electronically Signed   By: Evangeline Dakin M.D.   On: 06/10/2018 16:50   Dg Abd Portable 1 View  Result Date: 06/10/2018 CLINICAL DATA:  Abdominal pain. EXAM: PORTABLE ABDOMEN - 1 VIEW COMPARISON:  None FINDINGS: The bowel gas pattern is normal. No radio-opaque calculi or other significant radiographic abnormality are seen. IMPRESSION: Negative. Electronically Signed   By: Kerby Moors M.D.   On: 06/10/2018 19:18   US Abdomen Limited Ruq  Result Date: 06/10/2018 CLINICAL DATA:  Epigastric pain, elevated LFTs EXAM: ULTRASOUND ABDOMEN LIMITED RIGHT UPPER QUADRANT COMPARISON:  None. FINDINGS:  Gallbladder: No gallstones or wall thickening visualized. No sonographic Murphy sign noted by sonographer. Common bile duct: Diameter: 5 mm Liver: No focal lesion identified. Within normal limits in parenchymal echogenicity. Portal vein is patent on color Doppler imaging with normal direction of blood flow towards the liver. Other: Right pleural effusion. IMPRESSION: 1. No ultrasound abnormality of the right upper quadrant to explain pain. 2.  Right pleural effusion. Electronically Signed   By: Eddie Candle M.D.   On: 06/10/2018 19:33    Procedures .Critical Care Performed by: Lorin Glass, PA-C Authorized by: Lorin Glass, PA-C   Critical care provider statement:    Critical care time (minutes):  45   Critical care was time spent personally by me on the following activities:  Discussions with consultants, evaluation of patient's response to treatment, examination of patient, ordering and performing treatments and interventions, ordering and review of laboratory studies, ordering and review of radiographic studies, pulse oximetry, re-evaluation of patient's condition, obtaining history from patient or surrogate and review of old charts Comments:     Chest pain (Epigastric) with elevated troponin and transfer to a cardiac center.  Pulmonary edema.    (including critical care time)  Medications Ordered in ED Medications  sodium chloride 0.9 % bolus 500 mL (0 mLs Intravenous Stopped 06/10/18 1931)  aspirin chewable tablet 324 mg (324 mg Oral Given 06/10/18 2058)  heparin bolus via infusion 4,000 Units (4,000 Units Intravenous Bolus from Bag 06/10/18 2103)     Initial Impression / Assessment and Plan / ED Course  I have reviewed the triage vital signs and the nursing notes.  Pertinent labs & imaging results that were available during my care of the patient were reviewed by me and considered in my medical decision making (see chart for details).  Clinical Course as of Jun 10 2254   Sat Jun 10, 2018  1656 1. Atelectasis and/or bronchopneumonia involving the LEFT lung base. 2. Marked cardiomegaly without pulmonary edema.  Added on lactic acid, asked RN to re-check temperature.  Added covid testing based on tachypnea and covid precautions.   DG Chest Port 1 View [EH]  1840 Troponin I(!!): 0.51 [EH]  2018  Spoke with on-call cardiology who recommends that patient get treated with "ACS protocols" including heparin and aspirin.  Requested patient be admitted over to [facility] and that he be contacted when patient arrives. Dr. Raiford Simmonds   [EH]  2029 Spoke with Dr. Alcario Drought who will admit patient over to [facility].   [EH]    Clinical Course User Index [EH] Lorin Glass, PA-C      Patient presents today for evaluation of multiple complaints.  His primary concerns are dark tarry sticky stools for approximately 1 week with epigastric pain, shortness of breath and dry cough.  Labs were obtained and reviewed.  In-house coronavirus testing was obtained based on his degree of tachypnea and symptoms with high suspicion of admission.  He is not anemic and does not have a significant leukocytosis.  His CMP shows elevated creatinine at 1.33 with elevated BUN at 25.  His AST, ALT, and alk phos are all slightly elevated.  His Hemoccult was negative.  Lactic acid is not elevated and he is afebrile.  Lipase is not elevated.  Based on combination of epigastric pain with AST, ALT, and alk phos elevation right upper quadrant ultrasound was obtained which showed concern for a right sided pleural effusion without evidence of acute intra-abdominal abnormalities.  Chest x-ray shows significant cardiomegaly with concern for atelectasis versus bronchopneumonia in the left lower lobe.  His initial troponin came back elevated at 0.51.  Based on elevated troponin with concern for effusion high suspicion for acute heart failure.  I spoke with on-call cardiology who recommended heparin and aspirin, given that  patient does not have evidence of GI bleeding, and transferred to [facility] for admission due to availability of specialists.  I spoke with Dr. Alcario Drought who agreed to admit the patient.   This patient was seen as a shared visit with Dr. Venora Maples.    Final Clinical Impressions(s) / ED Diagnoses   Final diagnoses:  Epigastric pain  Elevated troponin  Acute heart failure, unspecified heart failure type [facility])  Shortness of breath    ED Discharge Orders    None       Ollen Gross 06/10/18 2258    Jola Schmidt, MD 06/10/18 2309

## 2018-06-10 NOTE — Progress Notes (Signed)
ANTICOAGULATION CONSULT NOTE - Initial Consult  Pharmacy Consult for IV heparin Indication: chest pain/ACS  No Known Allergies  Patient Measurements:   Heparin Dosing Weight: 82.3 in 2017  Vital Signs: Temp: 99 F (37.2 C) (05/16 1859) Temp Source: Oral (05/16 1859) BP: 144/83 (05/16 2015) Pulse Rate: 92 (05/16 2015)  Labs: Recent Labs    06/10/18 1649  HGB 13.4  HCT 41.1  PLT 238  CREATININE 1.33*  TROPONINI 0.51*    CrCl cannot be calculated (Unknown ideal weight.).   Medical History: Past Medical History:  Diagnosis Date  . Depression   . Diabetes mellitus without complication (HCC)     Medications:  Scheduled:  . aspirin  324 mg Oral Once  . [START ON 06/11/2018] aspirin EC  81 mg Oral Daily  . [START ON 06/11/2018] atorvastatin  40 mg Oral q1800   Infusions:    Assessment: 53 yo male presented to ED with abd pain, cough, shortness of breath and dark stools to start IV heparin for possible ACS. Baseline CBC and renal function drawn.  Goal of Therapy:  Heparin level 0.3-0.7 units/ml Monitor platelets by anticoagulation protocol: Yes   Plan:   4000 unit IV heparin bolus then   1000 units/hr IV heparin infusion rate  Check heparin level 6 hours after heparin started  Daily CBC and heparin level   Hessie Knows, PharmD, BCPS 06/10/2018 8:38 PM

## 2018-06-10 NOTE — ED Triage Notes (Signed)
Per [facility] from home pt c/o abd pains, cough, SOB and dark stools for week. Vitals 138 palpated, 91HR, 16R, 99% on room air, CBG 183

## 2018-06-10 NOTE — ED Notes (Signed)
ED TO INPATIENT HANDOFF REPORT  Name/Age/Gender Kerry Hamilton 53 y.o. male  Code Status    Code Status Orders  (From admission, onward)         Start     Ordered   06/10/18 2032  Full code  Continuous     06/10/18 2033        Code Status History    This patient has a current code status but no historical code status.      Home/SNF/Other Home  Chief Complaint Abd. Pain  Level of Care/Admitting Diagnosis ED Disposition    ED Disposition Condition Comment   Admit  Hospital Area: [facility] [[id number]]  Level of Care: Progressive [102]  Covid Evaluation: Screening Protocol (No Symptoms)  Diagnosis: ACS (acute coronary syndrome) [facility](HCC) [[id number]]) [[id number]]  Admitting Physician: Wyvonnia DuskyGARDNER, JARED M [[id number]]  Attending Physician: Hillary BowGARDNER, JARED M [phone][[id number]]  Estimated length of stay: past midnight tomorrow  Certification:: I certify this patient will need inpatient services for at least 2 midnights  PT Class (Do Not Modify): Inpatient [101]  PT Acc Code (Do Not Modify): Private [1]       Medical History Past Medical History:  Diagnosis Date  . Depression   . Diabetes mellitus without complication (HCC)     Allergies No Known Allergies  IV Location/Drains/Wounds Patient Lines/Drains/Airways Status   Active Line/Drains/Airways    Name:   Placement date:   Placement time:   Site:   Days:   Peripheral IV 06/10/18 Left Antecubital   06/10/18    1703    Antecubital   less than 1          Labs/Imaging Results for orders placed or performed during the hospital encounter of 06/10/18 (from the past 48 hour(s))  Urinalysis, Routine w reflex microscopic     Status: Abnormal   Collection Time: 06/10/18  4:14 PM  Result Value Ref Range   Color, Urine YELLOW YELLOW   APPearance CLEAR CLEAR   Specific Gravity, Urine 1.032 (H) 1.005 - 1.030   pH 5.0 5.0 - 8.0   Glucose, UA NEGATIVE NEGATIVE mg/dL   Hgb urine dipstick NEGATIVE NEGATIVE   Bilirubin Urine NEGATIVE NEGATIVE    Ketones, ur NEGATIVE NEGATIVE mg/dL   Protein, ur 098100 (A) NEGATIVE mg/dL   Nitrite NEGATIVE NEGATIVE   Leukocytes,Ua NEGATIVE NEGATIVE   RBC / HPF 0-5 0 - 5 RBC/hpf   WBC, UA 0-5 0 - 5 WBC/hpf   Bacteria, UA NONE SEEN NONE SEEN   Mucus PRESENT    Hyaline Casts, UA PRESENT     Comment: Performed at [facility], [address] [place], [place] [address]  Type and screen [facility]     Status: None   Collection Time: 06/10/18  4:48 PM  Result Value Ref Range   ABO/RH(D) B POS    Antibody Screen NEG    Sample Expiration      06/13/2018,2359 Performed at [facility], [address] [place], [place] [address]   Lipase, blood     Status: None   Collection Time: 06/10/18  4:49 PM  Result Value Ref Range   Lipase 29 11 - 51 U/L    Comment: Performed at [facility], [address] [place], [place] [address]  Comprehensive metabolic panel     Status: Abnormal   Collection Time: 06/10/18  4:49 PM  Result Value Ref Range   Sodium 139 135 - 145 mmol/L  Potassium 4.0 3.5 - 5.1 mmol/L   Chloride 110 98 - 111 mmol/L   CO2 20 (L) 22 - 32 mmol/L   Glucose, Bld 171 (H) 70 - 99 mg/dL   BUN 25 (H) 6 - 20 mg/dL   Creatinine, Ser 4.03 (H) 0.61 - 1.24 mg/dL   Calcium 8.7 (L) 8.9 - 10.3 mg/dL   Total Protein 6.2 (L) 6.5 - 8.1 g/dL   Albumin 3.5 3.5 - 5.0 g/dL   AST 65 (H) 15 - 41 U/L   ALT 87 (H) 0 - 44 U/L   Alkaline Phosphatase 186 (H) 38 - 126 U/L   Total Bilirubin 0.7 0.3 - 1.2 mg/dL   GFR calc non Af Amer >60 >60 mL/min   GFR calc Af Amer >60 >60 mL/min   Anion gap 9 5 - 15    Comment: Performed at [facility], [address], [place], [place] [address]  CBC with Differential     Status: None   Collection Time: 06/10/18  4:49 PM  Result Value Ref Range   WBC 5.0 4.0 - 10.5 K/uL   RBC 4.77 4.22 - 5.81 MIL/uL   Hemoglobin 13.4 13.0 - 17.0 g/dL   HCT 95.6 38.7 - 56.4 %   MCV 86.2  80.0 - 100.0 fL   MCH 28.1 26.0 - 34.0 pg   MCHC 32.6 30.0 - 36.0 g/dL   RDW 33.2 95.1 - 88.4 %   Platelets 238 150 - 400 K/uL   nRBC 0.0 0.0 - 0.2 %   Neutrophils Relative % 45 %   Neutro Abs 2.2 1.7 - 7.7 K/uL   Lymphocytes Relative 43 %   Lymphs Abs 2.1 0.7 - 4.0 K/uL   Monocytes Relative 10 %   Monocytes Absolute 0.5 0.1 - 1.0 K/uL   Eosinophils Relative 1 %   Eosinophils Absolute 0.1 0.0 - 0.5 K/uL   Basophils Relative 1 %   Basophils Absolute 0.1 0.0 - 0.1 K/uL   Immature Granulocytes 0 %   Abs Immature Granulocytes 0.01 0.00 - 0.07 K/uL    Comment: Performed at [facility], [address], [place], [place] [address]  Troponin I - ONCE - STAT     Status: Abnormal   Collection Time: 06/10/18  4:49 PM  Result Value Ref Range   Troponin I 0.51 (HH) <0.03 ng/mL    Comment: CRITICAL RESULT CALLED TO, READ BACK BY AND VERIFIED WITH: S.CLAPP,RN [id number] @1835  BY V.WILKINS Performed at [facility], [address] [place], [place] [address]   ABO/Rh     Status: None   Collection Time: 06/10/18  4:49 PM  Result Value Ref Range   ABO/RH(D)      B POS Performed at [facility], [address] [place], [place] [address]   POC occult blood, ED     Status: None   Collection Time: 06/10/18  5:07 PM  Result Value Ref Range   Fecal Occult Bld NEGATIVE NEGATIVE  Lactic acid, plasma     Status: None   Collection Time: 06/10/18  5:45 PM  Result Value Ref Range   Lactic Acid, Venous 1.6 0.5 - 1.9 mmol/L    Comment: Performed at [facility], [address] [place], [place] [address]  SARS Coronavirus 2 (CEPHEID- Performed in [facility] hospital lab), Hosp Order     Status: None   Collection Time: 06/10/18  6:03 PM  Result Value Ref Range  SARS Coronavirus 2 NEGATIVE NEGATIVE    Comment: (NOTE) If result is NEGATIVE SARS-CoV-2 target nucleic acids are NOT DETECTED. The SARS-CoV-2 RNA is generally  detectable in upper and lower  respiratory specimens during the acute phase of infection. The lowest  concentration of SARS-CoV-2 viral copies this assay can detect is 250  copies / mL. A negative result does not preclude SARS-CoV-2 infection  and should not be used as the sole basis for treatment or other  patient management decisions.  A negative result may occur with  improper specimen collection / handling, submission of specimen other  than nasopharyngeal swab, presence of viral mutation(s) within the  areas targeted by this assay, and inadequate number of viral copies  (<250 copies / mL). A negative result must be combined with clinical  observations, patient history, and epidemiological information. If result is POSITIVE SARS-CoV-2 target nucleic acids are DETECTED. The SARS-CoV-2 RNA is generally detectable in upper and lower  respiratory specimens dur ing the acute phase of infection.  Positive  results are indicative of active infection with SARS-CoV-2.  Clinical  correlation with patient history and other diagnostic information is  necessary to determine patient infection status.  Positive results do  not rule out bacterial infection or co-infection with other viruses. If result is PRESUMPTIVE POSTIVE SARS-CoV-2 nucleic acids MAY BE PRESENT.   A presumptive positive result was obtained on the submitted specimen  and confirmed on repeat testing.  While 2019 novel coronavirus  (SARS-CoV-2) nucleic acids may be present in the submitted sample  additional confirmatory testing may be necessary for epidemiological  and / or clinical management purposes  to differentiate between  SARS-CoV-2 and other Sarbecovirus currently known to infect humans.  If clinically indicated additional testing with an alternate test  methodology [phone]) is advised. The SARS-CoV-2 RNA is generally  detectable in upper and lower respiratory sp ecimens during the acute  phase of infection. The  expected result is Negative. Fact Sheet for Patients:  BoilerBrush.com.cy Fact Sheet for Healthcare Providers: https://pope.com/ This test is not yet approved or cleared by the [place] FDA and has been authorized for detection and/or diagnosis of SARS-CoV-2 by FDA under an Emergency Use Authorization (EUA).  This EUA will remain in effect (meaning this test can be used) for the duration of the COVID-19 declaration under Section 564(b)(1) of the Act, 21 U.S.C. section 360bbb-3(b)(1), unless the authorization is terminated or revoked sooner. Performed at [facility], [address] [place], [place] [address]   CBG monitoring, ED     Status: None   Collection Time: 06/10/18  9:09 PM  Result Value Ref Range   Glucose-Capillary 95 70 - 99 mg/dL   Comment 1 Notify RN    Comment 2 Document in Chart    Dg Chest Port 1 View  Result Date: 06/10/2018 CLINICAL DATA:  53 year old with one-week history of cough, shortness of breath, generalized abdominal pain and dark stools. EXAM: PORTABLE CHEST 1 VIEW COMPARISON:  None. FINDINGS: Cardiac silhouette markedly enlarged. Pulmonary vascularity normal. Streaky and patchy airspace opacities at the LEFT lung base. Lungs otherwise clear. No visible pleural effusions. IMPRESSION: 1. Atelectasis and/or bronchopneumonia involving the LEFT lung base. 2. Marked cardiomegaly without pulmonary edema. Electronically Signed   By: Hulan Saas M.D.   On: 06/10/2018 16:50   Dg Abd Portable 1 View  Result Date: 06/10/2018 CLINICAL DATA:  Abdominal pain. EXAM: PORTABLE ABDOMEN - 1 VIEW COMPARISON:  None FINDINGS: The bowel gas pattern is  normal. No radio-opaque calculi or other significant radiographic abnormality are seen. IMPRESSION: Negative. Electronically Signed   By: Signa Kell M.D.   On: 06/10/2018 19:18   US Abdomen Limited Ruq  Result Date: 06/10/2018 CLINICAL DATA:  Epigastric  pain, elevated LFTs EXAM: ULTRASOUND ABDOMEN LIMITED RIGHT UPPER QUADRANT COMPARISON:  None. FINDINGS: Gallbladder: No gallstones or wall thickening visualized. No sonographic Murphy sign noted by sonographer. Common bile duct: Diameter: 5 mm Liver: No focal lesion identified. Within normal limits in parenchymal echogenicity. Portal vein is patent on color Doppler imaging with normal direction of blood flow towards the liver. Other: Right pleural effusion. IMPRESSION: 1. No ultrasound abnormality of the right upper quadrant to explain pain. 2.  Right pleural effusion. Electronically Signed   By: Lauralyn Primes M.D.   On: 06/10/2018 19:33    Pending Labs Unresulted Labs (From admission, onward)    Start     Ordered   06/11/18 0500  CBC  Daily,   R     06/10/18 2039   06/11/18 0300  Heparin level (unfractionated)  Once-Timed,   R     06/10/18 2039   06/10/18 2054  D-dimer, quantitative (not at [facility])  ONCE - STAT,   R     06/10/18 2053   06/10/18 2052  Brain natriuretic peptide  Once,   R     06/10/18 2052   06/10/18 2032  Troponin I - Now Then Q6H  Now then every 6 hours,   STAT     06/10/18 2033   06/10/18 2031  HIV antibody (Routine Testing)  Once,   R     06/10/18 2033   06/10/18 1659  Culture, blood (routine x 2)  BLOOD CULTURE X 2,   STAT     06/10/18 1658   06/10/18 1656  Lactic acid, plasma  Now then every 2 hours,   STAT     06/10/18 1655          Vitals/Pain Today's Vitals   06/10/18 2100 06/10/18 2111 06/10/18 2115 06/10/18 2130  BP: (!) 142/112  (!) 139/102 (!) 130/109  Pulse: 90 96 91 88  Resp: (!) 27 19 (!) 32 (!) 21  Temp:      TempSrc:      SpO2: 99% 100% 99% 100%  PainSc:        Isolation Precautions No active isolations  Medications Medications  aspirin EC tablet 81 mg (has no administration in time range)  nitroGLYCERIN (NITROSTAT) SL tablet 0.4 mg (has no administration in time range)  acetaminophen (TYLENOL) tablet 650 mg (has no administration in time  range)  ondansetron (ZOFRAN) injection 4 mg (has no administration in time range)  atorvastatin (LIPITOR) tablet 40 mg (has no administration in time range)  insulin aspart (novoLOG) injection 0-9 Units (0 Units Subcutaneous Not Given 06/10/18 2110)  heparin ADULT infusion 100 units/mL (25000 units/234mL sodium chloride 0.45%) (1,000 Units/hr Intravenous New Bag/Given 06/10/18 2103)  sodium chloride 0.9 % bolus 500 mL (0 mLs Intravenous Stopped 06/10/18 1931)  aspirin chewable tablet 324 mg (324 mg Oral Given 06/10/18 2058)  heparin bolus via infusion 4,000 Units (4,000 Units Intravenous Bolus from Bag 06/10/18 2103)    Mobility walks

## 2018-06-10 NOTE — Consult Note (Signed)
Cardiology Consult    Patient ID: Kerry Hamilton MRN: [id number], DOB/AGE: Feb 22, 1965   Admit date: 06/10/2018 Date of Consult: 06/10/2018  Primary Physician: Default, Provider, MD Primary Cardiologist: No primary care provider on file.   Past Medical History   Past Medical History:  Diagnosis Date  . Depression   . Diabetes mellitus without complication (HCC)     History reviewed. No pertinent surgical history.   Allergies  No Known Allergies  History of Present Illness    53 year old man with past medical history of psychiatric disease [schizophrenia].  He is also a smoker and has diet-controlled diabetes.  He lives in a halfway house.  Smokes half a pack a day.  He noted a week ago that he started developing shortness of breath mild activity.  He has orthopnea, dry cough.  Abdominal fullness was noted.  Also some abdominal pain.  Symptoms escalated to the point that he decided to come to the hospital.  [facility] he was found to have an enlarged heart on x-ray.  He was fluid overloaded.  He had an elevated NT proBNP and a troponin of 0.5.  [facility].  Inpatient Medications    . [START ON 06/11/2018] aspirin EC  81 mg Oral Daily  . [START ON 06/11/2018] atorvastatin  40 mg Oral q1800  . insulin aspart  0-9 Units Subcutaneous Q4H    Family History    History reviewed. No pertinent family history. has no family status information on file.    Social History    Social History   Socioeconomic History  . Marital status: Single    Spouse name: Not on file  . Number of children: Not on file  . Years of education: Not on file  . Highest education level: Not on file  Occupational History  . Not on file  Social Needs  . Financial resource strain: Not on file  . Food insecurity:    Worry: Not on file    Inability: Not on file  . Transportation needs:    Medical: Not on file    Non-medical: Not on file  Tobacco Use  . Smoking status:  Current Some Day Smoker    Types: Cigarettes  . Smokeless tobacco: Never Used  Substance and Sexual Activity  . Alcohol use: Yes    Alcohol/week: 4.0 standard drinks    Types: 4 Cans of beer per week  . Drug use: No  . Sexual activity: Not on file  Lifestyle  . Physical activity:    Days per week: Not on file    Minutes per session: Not on file  . Stress: Not on file  Relationships  . Social connections:    Talks on phone: Not on file    Gets together: Not on file    Attends religious service: Not on file    Active member of club or organization: Not on file    Attends meetings of clubs or organizations: Not on file    Relationship status: Not on file  . Intimate partner violence:    Fear of current or ex partner: Not on file    Emotionally abused: Not on file    Physically abused: Not on file    Forced sexual activity: Not on file  Other Topics Concern  . Not on file  Social History Narrative  . Not on file     Review of Systems    General:  No chills, fever, night sweats  or weight changes.  Cardiovascular:  No chest pain, dyspnea on exertion, edema, orthopnea, palpitations, paroxysmal nocturnal dyspnea. Dermatological: No rash, lesions/masses Respiratory: No cough, dyspnea Urologic: No hematuria, dysuria Abdominal:   No nausea, vomiting, diarrhea, bright red blood per rectum, melena, or hematemesis Neurologic:  No visual changes, wkns, changes in mental status. All other systems reviewed and are otherwise negative except as noted above.  Physical Exam    Blood pressure 133/86, pulse 89, temperature 97.8 F (36.6 C), temperature source Oral, resp. rate (!) 32, height 6' (1.829 m), weight 90.1 kg, SpO2 100 %.  General: Anxious, alert Psych: Anxious Neuro: Alert and oriented X 3. Moves all extremities spontaneously. HEENT: Normal  Neck: Elevated jugular venous pressure Lungs: Tachypnea Heart: RRR no s3, s4, or murmurs. Abdomen: Soft, non-tender, non-distended, BS  + x 4.  Extremities: Warm, 1+ edema  Labs    Troponin (Point of Care Test) No results for input(s): TROPIPOC in the last 72 hours. Recent Labs    06/10/18 1649 06/10/18 2054  TROPONINI 0.51* 0.41*   Lab Results  Component Value Date   WBC 5.0 06/10/2018   HGB 13.4 06/10/2018   HCT 41.1 06/10/2018   MCV 86.2 06/10/2018   PLT 238 06/10/2018    Recent Labs  Lab 06/10/18 1649  NA 139  K 4.0  CL 110  CO2 20*  BUN 25*  CREATININE 1.33*  CALCIUM 8.7*  PROT 6.2*  BILITOT 0.7  ALKPHOS 186*  ALT 87*  AST 65*  GLUCOSE 171*   No results found for: CHOL, HDL, LDLCALC, TRIG Lab Results  Component Value Date   DDIMER 0.81 (H) 06/10/2018     Radiology Studies    Dg Chest Port 1 View  Result Date: 06/10/2018 CLINICAL DATA:  53 year old with one-week history of cough, shortness of breath, generalized abdominal pain and dark stools. EXAM: PORTABLE CHEST 1 VIEW COMPARISON:  None. FINDINGS: Cardiac silhouette markedly enlarged. Pulmonary vascularity normal. Streaky and patchy airspace opacities at the LEFT lung base. Lungs otherwise clear. No visible pleural effusions. IMPRESSION: 1. Atelectasis and/or bronchopneumonia involving the LEFT lung base. 2. Marked cardiomegaly without pulmonary edema. Electronically Signed   By: Hulan Saas M.D.   On: 06/10/2018 16:50   Dg Abd Portable 1 View  Result Date: 06/10/2018 CLINICAL DATA:  Abdominal pain. EXAM: PORTABLE ABDOMEN - 1 VIEW COMPARISON:  None FINDINGS: The bowel gas pattern is normal. No radio-opaque calculi or other significant radiographic abnormality are seen. IMPRESSION: Negative. Electronically Signed   By: Signa Kell M.D.   On: 06/10/2018 19:18   US Abdomen Limited Ruq  Result Date: 06/10/2018 CLINICAL DATA:  Epigastric pain, elevated LFTs EXAM: ULTRASOUND ABDOMEN LIMITED RIGHT UPPER QUADRANT COMPARISON:  None. FINDINGS: Gallbladder: No gallstones or wall thickening visualized. No sonographic Murphy sign noted by  sonographer. Common bile duct: Diameter: 5 mm Liver: No focal lesion identified. Within normal limits in parenchymal echogenicity. Portal vein is patent on color Doppler imaging with normal direction of blood flow towards the liver. Other: Right pleural effusion. IMPRESSION: 1. No ultrasound abnormality of the right upper quadrant to explain pain. 2.  Right pleural effusion. Electronically Signed   By: Lauralyn Primes M.D.   On: 06/10/2018 19:33    ECG & Cardiac Imaging    Normal sinus rhythm with right bundle branch block  Assessment & Plan    53 year old with multiple risk factors for CAD presents with new onset heart failure and NSTEMI.  He is a smoker, has of  psychiatric disease, and diabetes that is controlled with diet.  This presents a new diagnosis of heart failure for him.  He is symptomatic with NYHA class IV symptoms.  Size of the heart on x-ray suggests longstanding heart failure  Recommendations: -TTE in the morning -Trend troponins -Serial EKGs -Plan on left heart cath and right a cath on Monday -IV diuresis for now.  Can consider starting with 40 Lasix now and then dose tomorrow morning again -Obtain thyroid function tests as well as hemoglobin A1c    Signed, Macario Golds, MD 06/10/2018, 11:34 PM  For questions or updates, please contact   Please consult [email or web address] for contact info under Cardiology/STEMI.

## 2018-06-10 NOTE — ED Notes (Signed)
Report given to [facility]  

## 2018-06-10 NOTE — ED Notes (Signed)
[facility] called for transport 

## 2018-06-10 NOTE — H&P (Signed)
History and Physical    Kerry Hamilton JQB:[id number] DOB: 09-01-1965 DOA: 06/10/2018  PCP: Default, Provider, MD  Patient coming from: Home  I have personally briefly reviewed patient's old medical records in [place]  Chief Complaint: CP  HPI: Kerry Hamilton is a 53 y.o. male with medical history significant of DM2, diet controlled, depression.  Patient presents to the ED with c/o 1 week history of "burning" quality substernal CP / epigastric AP.  Also had dark tarry BMs for past 6 days though he has been taking peptobismol for the pain.  Pain does not radiate.  Not really helped by the pepto.  Not changed by eating.  Has had SOB since today.  Dry cough onset yesterday.  No known sick contacts.  No fevers at home   ED Course: Trop 0.51 and EKG being read as RBBB (though looks more like Q waves in V1 and V2 to me with TWI in V3, V4)  Hemoccult negative and HGB 13.x.  COVID neg  CXR shows marked cardiomegally without pulmonary edema, also shows actelectasis vs PNA in LLL  Lactate Nl, WBC Nl.  Creat 1.33, BUN 25.  ALK 186, AST 64, ALT 87.  Lipase nl  RUQ [place] nl.   Review of Systems: As per HPI otherwise 10 point review of systems negative.   Past Medical History:  Diagnosis Date  . Depression   . Diabetes mellitus without complication ([place])     History reviewed. No pertinent surgical history.   reports that he has been smoking cigarettes. He has never used smokeless tobacco. He reports current alcohol use of about 4.0 standard drinks of alcohol per week. He reports that he does not use drugs.  No Known Allergies  History reviewed. No pertinent family history. Heart disease in grand mother.  Prior to Admission medications   Medication Sig Start Date End Date Taking? Authorizing Provider  QUEtiapine (SEROQUEL) 100 MG tablet Take 100 mg by mouth at bedtime. 02/21/18  Yes [provider]  risperiDONE (RISPERDAL) 3 MG tablet Take 3 mg by mouth at bedtime.  02/21/18  Yes [provider]    Physical Exam: Vitals:   06/10/18 1859 06/10/18 1900 06/10/18 1930 06/10/18 2015  BP:  (!) 144/106 (!) 134/99 (!) 144/83  Pulse:  94 95 92  Resp:  (!) 23 (!) 22 20  Temp: 99 F (37.2 C)     TempSrc: Oral     SpO2:  99% 98% 100%    Constitutional: NAD, calm, comfortable Eyes: PERRL, lids and conjunctivae normal ENMT: Mucous membranes are moist. Posterior pharynx clear of any exudate or lesions.Normal dentition.  Neck: normal, supple, no masses, no thyromegaly Respiratory: clear to auscultation bilaterally, no wheezing, no crackles. Normal respiratory effort. No accessory muscle use.  Cardiovascular: Regular rate and rhythm, no murmurs / rubs / gallops. No extremity edema. 2+ pedal pulses. No carotid bruits.  Abdomen: no tenderness, no masses palpated. No hepatosplenomegaly. Bowel sounds positive.  Musculoskeletal: no clubbing / cyanosis. No joint deformity upper and lower extremities. Good ROM, no contractures. Normal muscle tone.  Skin: no rashes, lesions, ulcers. No induration Neurologic: CN 2-12 grossly intact. Sensation intact, DTR normal. Strength 5/5 in all 4.  Psychiatric: Normal judgment and insight. Alert and oriented x 3. Normal mood.    Labs on Admission: I have personally reviewed following labs and imaging studies  CBC: Recent Labs  Lab 06/10/18 1649  WBC 5.0  NEUTROABS 2.2  HGB 13.4  HCT 41.1  MCV  86.2  PLT 315   Basic Metabolic Panel: Recent Labs  Lab 06/10/18 1649  NA 139  K 4.0  CL 110  CO2 20*  GLUCOSE 171*  BUN 25*  CREATININE 1.33*  CALCIUM 8.7*   GFR: CrCl cannot be calculated (Unknown ideal weight.). Liver Function Tests: Recent Labs  Lab 06/10/18 1649  AST 65*  ALT 87*  ALKPHOS 186*  BILITOT 0.7  PROT 6.2*  ALBUMIN 3.5   Recent Labs  Lab 06/10/18 1649  LIPASE 29   No results for input(s): AMMONIA in the last 168 hours. Coagulation Profile: No results for input(s): INR, PROTIME in  the last 168 hours. Cardiac Enzymes: Recent Labs  Lab 06/10/18 1649  TROPONINI 0.51*   BNP (last 3 results) No results for input(s): PROBNP in the last 8760 hours. HbA1C: No results for input(s): HGBA1C in the last 72 hours. CBG: No results for input(s): GLUCAP in the last 168 hours. Lipid Profile: No results for input(s): CHOL, HDL, LDLCALC, TRIG, CHOLHDL, LDLDIRECT in the last 72 hours. Thyroid Function Tests: No results for input(s): TSH, T4TOTAL, FREET4, T3FREE, THYROIDAB in the last 72 hours. Anemia Panel: No results for input(s): VITAMINB12, FOLATE, FERRITIN, TIBC, IRON, RETICCTPCT in the last 72 hours. Urine analysis:    Component Value Date/Time   COLORURINE YELLOW 06/10/2018 [place] 06/10/2018 1614   LABSPEC 1.032 (H) 06/10/2018 1614   PHURINE 5.0 06/10/2018 [place] 06/10/2018 1614   [place] 06/10/2018 [place] 06/10/2018 [place] 06/10/2018 1614   PROTEINUR 100 (A) 06/10/2018 1614   NITRITE NEGATIVE 06/10/2018 1614   LEUKOCYTESUR NEGATIVE 06/10/2018 1614    Radiological Exams on Admission: Dg Chest Port 1 View  Result Date: 06/10/2018 CLINICAL DATA:  53 year old with one-week history of cough, shortness of breath, generalized abdominal pain and dark stools. EXAM: PORTABLE CHEST 1 VIEW COMPARISON:  None. FINDINGS: Cardiac silhouette markedly enlarged. Pulmonary vascularity normal. Streaky and patchy airspace opacities at the LEFT lung base. Lungs otherwise clear. No visible pleural effusions. IMPRESSION: 1. Atelectasis and/or bronchopneumonia involving the LEFT lung base. 2. Marked cardiomegaly without pulmonary edema. Electronically Signed   By: Evangeline Dakin M.D.   On: 06/10/2018 16:50   Dg Abd Portable 1 View  Result Date: 06/10/2018 CLINICAL DATA:  Abdominal pain. EXAM: PORTABLE ABDOMEN - 1 VIEW COMPARISON:  None FINDINGS: The bowel gas pattern is normal. No radio-opaque calculi or  other significant radiographic abnormality are seen. IMPRESSION: Negative. Electronically Signed   By: Kerby Moors M.D.   On: 06/10/2018 19:18   US Abdomen Limited Ruq  Result Date: 06/10/2018 CLINICAL DATA:  Epigastric pain, elevated LFTs EXAM: ULTRASOUND ABDOMEN LIMITED RIGHT UPPER QUADRANT COMPARISON:  None. FINDINGS: Gallbladder: No gallstones or wall thickening visualized. No sonographic Murphy sign noted by sonographer. Common bile duct: Diameter: 5 mm Liver: No focal lesion identified. Within normal limits in parenchymal echogenicity. Portal vein is patent on color Doppler imaging with normal direction of blood flow towards the liver. Other: Right pleural effusion. IMPRESSION: 1. No ultrasound abnormality of the right upper quadrant to explain pain. 2.  Right pleural effusion. Electronically Signed   By: Eddie Candle M.D.   On: 06/10/2018 19:33    EKG: Independently reviewed.  Assessment/Plan Principal Problem:   ACS (acute coronary syndrome) (HCC) Active Problems:   DM2 (diabetes mellitus, type 2) (HCC)   Depression    1. ACS - vs CHF vs PE with cor pulmonale (  certianly has marked cardiomegally, positive trop, CP, and an abnormal EKG without prior available for comparison). 1. ACS pathway 2. Serial trops 3. Tele monitor 4. NPO 5. Transfer to [facility] 6. Cards to see on arrival 7. Heparin gtt 8. NTG SL PRN for now, will defer start of gtt to cards. 9. ASA 325 now and 81 daily 10. Ordering max dose statin 11. Will defer ACEi and BB to cards 12. BNP pending 13. D.Dimer pending 14. Repeat EKG 2. DM2 - 1. SSI q4h 3. Depression - 1. QTC 520 2. Holding depression meds until cards can evaluate  DVT prophylaxis: Heparin GTT Code Status: Full Family Communication: No family in room Disposition Plan: Home after admit Consults called: Dr. Raiford Simmonds to see when he arrives on [facility] unit Admission status: Admit to inpatient  Severity of Illness: The appropriate patient status for this  patient is INPATIENT. Inpatient status is judged to be reasonable and necessary in order to provide the required intensity of service to ensure the patient's safety. The patient's presenting symptoms, physical exam findings, and initial radiographic and laboratory data in the context of their chronic comorbidities is felt to place them at high risk for further clinical deterioration. Furthermore, it is not anticipated that the patient will be medically stable for discharge from the hospital within 2 midnights of admission. The following factors support the patient status of inpatient.   IP stats for treatment of ACS.  NSTEMI vs survived a STEMI.  EKG showing anterior Q waves.  Trop elevated.  * I certify that at the point of admission it is my clinical judgment that the patient will require inpatient hospital care spanning beyond 2 midnights from the point of admission due to high intensity of service, high risk for further deterioration and high frequency of surveillance required.*    GARDNER, JARED M. DO [facility]  How to contact the [facility] Attending or Consulting provider [place] or covering provider during after hours [place], for this patient?  1. Check the care team in [facility] and look for a) attending/consulting TR[facility] provider listed and b) the [facility] team listed 2. Log into [email or web address]  Amion Physician Scheduling and messaging for groups and whole hospitals  On call and physician scheduling software for group practices, residents, hospitalists and other medical providers for call, clinic, rotation and shift schedules. OnCall Enterprise is a hospital-wide system for scheduling doctors and paging doctors on call. EasyPlot is for scientific plotting and data analysis.  [email or web address]  and use [place]'s universal password to access. If you do not have the password, please contact the hospital operator.  3. Locate the [facility] provider you are looking for under [facility] and page to a  number that you can be directly reached. 4. If you still have difficulty reaching the provider, please page the [facility] (Director on Call) for the Hospitalists listed on amion for assistance.  06/10/2018, 8:49 PM

## 2018-06-11 ENCOUNTER — Inpatient Hospital Stay (HOSPITAL_COMMUNITY): Payer: Medicaid Other

## 2018-06-11 DIAGNOSIS — R0602 Shortness of breath: Secondary | ICD-10-CM

## 2018-06-11 DIAGNOSIS — I361 Nonrheumatic tricuspid (valve) insufficiency: Secondary | ICD-10-CM

## 2018-06-11 DIAGNOSIS — R9431 Abnormal electrocardiogram [ECG] [EKG]: Secondary | ICD-10-CM

## 2018-06-11 DIAGNOSIS — I340 Nonrheumatic mitral (valve) insufficiency: Secondary | ICD-10-CM

## 2018-06-11 DIAGNOSIS — I5021 Acute systolic (congestive) heart failure: Secondary | ICD-10-CM | POA: Diagnosis present

## 2018-06-11 DIAGNOSIS — R7989 Other specified abnormal findings of blood chemistry: Secondary | ICD-10-CM

## 2018-06-11 DIAGNOSIS — F1910 Other psychoactive substance abuse, uncomplicated: Secondary | ICD-10-CM

## 2018-06-11 DIAGNOSIS — R778 Other specified abnormalities of plasma proteins: Secondary | ICD-10-CM

## 2018-06-11 DIAGNOSIS — I509 Heart failure, unspecified: Secondary | ICD-10-CM

## 2018-06-11 LAB — HEPARIN LEVEL (UNFRACTIONATED)
Heparin Unfractionated: <0.1 IU/mL — ABNORMAL LOW (ref 0.30–0.70)
Heparin Unfractionated: 0.42 IU/mL (ref 0.30–0.70)

## 2018-06-11 LAB — CBC
HCT: 40.1 % (ref 39.0–52.0)
Hemoglobin: 13.3 g/dL (ref 13.0–17.0)
MCH: 27.5 pg (ref 26.0–34.0)
MCHC: 33.2 g/dL (ref 30.0–36.0)
MCV: 82.9 fL (ref 80.0–100.0)
Platelets: 232 10*3/uL (ref 150–400)
RBC: 4.84 MIL/uL (ref 4.22–5.81)
RDW: 13.7 % (ref 11.5–15.5)
WBC: 5.1 10*3/uL (ref 4.0–10.5)
nRBC: 0 % (ref 0.0–0.2)

## 2018-06-11 LAB — COMPREHENSIVE METABOLIC PANEL
ALT: 139 U/L — ABNORMAL HIGH (ref 0–44)
AST: 114 U/L — ABNORMAL HIGH (ref 15–41)
Albumin: 3.3 g/dL — ABNORMAL LOW (ref 3.5–5.0)
Alkaline Phosphatase: 169 U/L — ABNORMAL HIGH (ref 38–126)
Anion gap: 15 (ref 5–15)
BUN: 22 mg/dL — ABNORMAL HIGH (ref 6–20)
CO2: 16 mmol/L — ABNORMAL LOW (ref 22–32)
Calcium: 8.7 mg/dL — ABNORMAL LOW (ref 8.9–10.3)
Chloride: 107 mmol/L (ref 98–111)
Creatinine, Ser: 1.19 mg/dL (ref 0.61–1.24)
GFR calc Af Amer: >60 mL/min (ref >60)
GFR calc non Af Amer: >60 mL/min (ref >60)
Glucose, Bld: 124 mg/dL — ABNORMAL HIGH (ref 70–99)
Potassium: 4.5 mmol/L (ref 3.5–5.1)
Sodium: 138 mmol/L (ref 135–145)
Total Bilirubin: 1.5 mg/dL — ABNORMAL HIGH (ref 0.3–1.2)
Total Protein: 5.8 g/dL — ABNORMAL LOW (ref 6.5–8.1)

## 2018-06-11 LAB — GLUCOSE, CAPILLARY
Glucose-Capillary: 115 mg/dL — ABNORMAL HIGH (ref 70–99)
Glucose-Capillary: 184 mg/dL — ABNORMAL HIGH (ref 70–99)

## 2018-06-11 LAB — ECHOCARDIOGRAM COMPLETE
Height: 72 in
Weight: 3238.12 oz

## 2018-06-11 LAB — HEMOGLOBIN A1C
Hgb A1c MFr Bld: 6.3 % — ABNORMAL HIGH (ref 4.8–5.6)
Mean Plasma Glucose: 134.11 mg/dL

## 2018-06-11 LAB — TSH: TSH: 1.739 u[IU]/mL (ref 0.350–4.500)

## 2018-06-11 LAB — HIV ANTIBODY (ROUTINE TESTING W REFLEX): HIV Screen 4th Generation wRfx: NONREACTIVE

## 2018-06-11 MED ORDER — IOHEXOL 350 MG/ML SOLN
100.0000 mL | Freq: Once | INTRAVENOUS | Status: AC | PRN
  Administered 2018-06-11: 100 mL via INTRAVENOUS

## 2018-06-11 MED ORDER — FUROSEMIDE 10 MG/ML IJ SOLN
40.0000 mg | Freq: Once | INTRAMUSCULAR | Status: AC
  Administered 2018-06-11: 09:00:00 40 mg via INTRAVENOUS
  Filled 2018-06-11: qty 4

## 2018-06-11 MED ORDER — ALUM & MAG HYDROXIDE-SIMETH 200-200-20 MG/5ML PO SUSP
30.0000 mL | Freq: Once | ORAL | Status: AC
  Administered 2018-06-11: 03:00:00 30 mL via ORAL
  Filled 2018-06-11: qty 30

## 2018-06-11 MED ORDER — LIDOCAINE VISCOUS HCL 2 % MT SOLN
15.0000 mL | Freq: Once | OROMUCOSAL | Status: AC
  Administered 2018-06-11: 15 mL via ORAL
  Filled 2018-06-11: qty 15

## 2018-06-11 MED ORDER — ALUM & MAG HYDROXIDE-SIMETH 200-200-20 MG/5ML PO SUSP
30.0000 mL | ORAL | Status: DC | PRN
  Administered 2018-06-11 – 2018-06-14 (×2): 30 mL via ORAL
  Filled 2018-06-11 (×2): qty 30

## 2018-06-11 MED ORDER — FUROSEMIDE 10 MG/ML IJ SOLN
40.0000 mg | Freq: Once | INTRAMUSCULAR | Status: AC
  Administered 2018-06-11: 11:00:00 40 mg via INTRAVENOUS
  Filled 2018-06-11: qty 4

## 2018-06-11 MED ORDER — FUROSEMIDE 10 MG/ML IJ SOLN
40.0000 mg | Freq: Two times a day (BID) | INTRAMUSCULAR | Status: DC
  Administered 2018-06-11 – 2018-06-12 (×2): 40 mg via INTRAVENOUS
  Filled 2018-06-11 (×2): qty 4

## 2018-06-11 MED ORDER — HEPARIN BOLUS VIA INFUSION
2500.0000 [IU] | Freq: Once | INTRAVENOUS | Status: AC
  Administered 2018-06-11: 2500 [IU] via INTRAVENOUS
  Filled 2018-06-11: qty 2500

## 2018-06-11 MED ORDER — LORAZEPAM 2 MG/ML IJ SOLN
1.0000 mg | Freq: Once | INTRAMUSCULAR | Status: AC
  Administered 2018-06-11: 1 mg via INTRAVENOUS
  Filled 2018-06-11: qty 1

## 2018-06-11 MED ORDER — NICOTINE 21 MG/24HR TD PT24
21.0000 mg | MEDICATED_PATCH | Freq: Every day | TRANSDERMAL | Status: DC
  Administered 2018-06-11 – 2018-06-16 (×5): 21 mg via TRANSDERMAL
  Filled 2018-06-11 (×5): qty 1

## 2018-06-11 MED ORDER — SENNOSIDES-DOCUSATE SODIUM 8.6-50 MG PO TABS
1.0000 | ORAL_TABLET | Freq: Every day | ORAL | Status: DC
  Administered 2018-06-12 – 2018-06-15 (×3): 1 via ORAL
  Filled 2018-06-11 (×3): qty 1

## 2018-06-11 NOTE — Progress Notes (Signed)
ANTICOAGULATION CONSULT NOTE - Initial Consult  Pharmacy Consult for IV heparin Indication: chest pain/ACS  No Known Allergies  Patient Measurements: Height: 6' (182.9 cm) Weight: 202 lb 6.1 oz (91.8 kg) IBW/kg (Calculated) : 77.6 Heparin Dosing Weight: 90.1  Vital Signs: Temp: 98 F (36.7 C) (05/17 1619) Temp Source: Oral (05/17 1619) BP: 121/93 (05/17 1619) Pulse Rate: 92 (05/17 1619)  Labs: Recent Labs    06/10/18 1649 06/10/18 2054 06/11/18 0854 06/11/18 1640  HGB 13.4  --  13.3  --   HCT 41.1  --  40.1  --   PLT 238  --  232  --   HEPARINUNFRC  --   --  <0.10* 0.42  CREATININE 1.33*  --  1.19  --   TROPONINI 0.Kerry* 0.41*  --   --     Estimated Creatinine Clearance: 80.6 mL/min (by C-G formula based on SCr of 1.19 mg/dL).   Medical History: Past Medical History:  Diagnosis Date  . Depression   . Diabetes mellitus without complication (HCC)     Medications:  Scheduled:  . aspirin EC  81 mg Oral Daily  . atorvastatin  40 mg Oral q1800  . furosemide  40 mg Intravenous BID  . insulin aspart  0-9 Units Subcutaneous Q4H   Infusions:  . heparin 1,300 Units/hr (06/11/18 1323)    Assessment: Kerry Hamilton presenting with burning substernal chest pain and epigastric abdominal pain x1 week. Pharmacy consulted to dose IV heparin for r/o ACS. Plan for [facility] this week.  Heparin level within goal range at 0.42 following re-bolus and rate increase this AM. CBC WNL. No bleeding noted.   Goal of Therapy:  Heparin level 0.3-0.7 units/ml Monitor platelets by anticoagulation protocol: Yes   Plan:  Continue heparin gtt at 1300 units/hr Recheck heparin level with AM labs Daily heparin level and CBC Monitor s/sx of bleeding  Xoe Hoe N. Zigmund Daniel, PharmD, BCPS PGY2 Infectious Diseases Pharmacy Resident Phone: [phone] 06/11/2018   5:58 PM

## 2018-06-11 NOTE — Progress Notes (Addendum)
Progress Note  Patient Name: Kerry Hamilton Date of Encounter: 06/11/2018  Primary Cardiologist: New to [facility] (Dr. Debara Pickett)  Subjective   Patient admitted overnight with signs and symptoms suggestive of CHF. Also found to have elevated troponin. Patient reports continued shortness of breath as well as abdominal pain and fullness. Patient slept on an incline last night.  Inpatient Medications    Scheduled Meds:  aspirin EC  81 mg Oral Daily   atorvastatin  40 mg Oral q1800   furosemide  40 mg Intravenous Once   insulin aspart  0-9 Units Subcutaneous Q4H   Continuous Infusions:  heparin 1,000 Units/hr (06/10/18 2103)   PRN Meds: acetaminophen, alum & mag hydroxide-simeth, nitroGLYCERIN, ondansetron (ZOFRAN) IV   Vital Signs    Vitals:   06/10/18 2317 06/10/18 2324 06/11/18 0509 06/11/18 0748  BP: (!) 137/107 133/86 (!) 129/94 (!) 136/105  Pulse:   94 98  Resp:  (!) 32  (!) 21  Temp:   (!) 97.5 F (36.4 C) 98.1 F (36.7 C)  TempSrc:   Oral Oral  SpO2:   92% 98%  Weight:   91.8 kg   Height:        Intake/Output Summary (Last 24 hours) at 06/11/2018 0757 Last data filed at 06/11/2018 0543 Gross per 24 hour  Intake 986.26 ml  Output 100 ml  Net 886.26 ml   Last 3 Weights 06/11/2018 06/10/2018 03/19/2015  Weight (lbs) 202 lb 6.1 oz 198 lb 10.2 oz 181 lb 2 oz  Weight (kg) 91.8 kg 90.1 kg 82.158 kg      Telemetry    Normal sinus rhythm with rates in the 80's to 90's and occasional PVCs. - Personally Reviewed  ECG    Normal sinus rhythm with RBBB and T wave inversion in leads anterior leads but a lot of baseline artifact. - Personally Reviewed  Physical Exam   GEN: African-American male resting comfortably. Mildly tachypneic but no acute distress.   Neck: JVD elevated. Cardiac: RRR. No significant murmurs, rubs, or gallops.  Respiratory: Mildly tachypneic but no increased work of breathing. Diminished breath sounds with bibasilar crackles (left > right).  GI:  Abdomen soft and non-distended but mildly tender to palpation (mostly in right upper quadrant). Bowel sounds present. MS: Trace lower extremity edema. Neuro:  No-focal deficits.  Psych: Normal affect. Responds appropriately.   Labs    Chemistry Recent Labs  Lab 06/10/18 1649  NA 139  K 4.0  CL 110  CO2 20*  GLUCOSE 171*  BUN 25*  CREATININE 1.33*  CALCIUM 8.7*  PROT 6.2*  ALBUMIN 3.5  AST 65*  ALT 87*  ALKPHOS 186*  BILITOT 0.7  GFRNONAA >60  GFRAA >60  ANIONGAP 9     Hematology Recent Labs  Lab 06/10/18 1649  WBC 5.0  RBC 4.77  HGB 13.4  HCT 41.1  MCV 86.2  MCH 28.1  MCHC 32.6  RDW 13.9  PLT 238    Cardiac Enzymes Recent Labs  Lab 06/10/18 1649 06/10/18 2054  TROPONINI 0.51* 0.41*   No results for input(s): TROPIPOC in the last 168 hours.   BNP Recent Labs  Lab 06/10/18 2052  BNP 1,634.1*     DDimer  Recent Labs  Lab 06/10/18 1649  DDIMER 0.81*     Radiology    Ct Angio Chest Pe W Or Wo Contrast  Result Date: 06/11/2018 CLINICAL DATA:  Cough and shortness of breath. EXAM: CT ANGIOGRAPHY CHEST WITH CONTRAST TECHNIQUE: Multidetector CT imaging of  the chest was performed using the standard protocol during bolus administration of intravenous contrast. Multiplanar CT image reconstructions and MIPs were obtained to evaluate the vascular anatomy. CONTRAST:  173m OMNIPAQUE IOHEXOL 350 MG/ML SOLN COMPARISON:  Radiograph yesterday FINDINGS: Cardiovascular: There are no filling defects within the pulmonary arteries to suggest pulmonary embolus. Subsegmental branches are not well assessed due to diminished contrast opacification. Prominent multi chamber cardiomegaly. Contrast refluxes into the hepatic veins and IVC. Persistent left SVC draining into the coronary sinus. Normal caliber thoracic aorta, cannot assess for dissection given phase of contrast. No pericardial effusion. Mediastinum/Nodes: Enlarged right hilar node measures 19 mm short axis. Small  left hilar and mediastinal nodes. Thyroid gland is not well-defined, may be heterogeneously enlarged, however difficult to differentiate from the adjacent soft tissues at the thoracic inlet. Esophagus is decompressed. Lungs/Pleura: Small to moderate right and small left pleural effusion. Adjacent compressive atelectasis. Mild smooth septal thickening and scattered ground-glass opacities consistent with pulmonary edema. Mild bronchial thickening may also be congestive. Upper Abdomen: Contrast refluxes into the hepatic veins and IVC. No other acute finding. Musculoskeletal: There are no acute or suspicious osseous abnormalities. Review of the MIP images confirms the above findings. IMPRESSION: 1. No pulmonary embolus. 2. Findings consistent with congestive heart failure. Prominent cardiomegaly with bilateral pleural effusions and pulmonary edema. 3. Right hilar adenopathy is likely reactive. 4. Persistent left SVC draining into the coronary sinus. Electronically Signed   By: MKeith RakeM.D.   On: 06/11/2018 03:20   Dg Chest Port 1 View  Result Date: 06/10/2018 CLINICAL DATA:  53year old with one-week history of cough, shortness of breath, generalized abdominal pain and dark stools. EXAM: PORTABLE CHEST 1 VIEW COMPARISON:  None. FINDINGS: Cardiac silhouette markedly enlarged. Pulmonary vascularity normal. Streaky and patchy airspace opacities at the LEFT lung base. Lungs otherwise clear. No visible pleural effusions. IMPRESSION: 1. Atelectasis and/or bronchopneumonia involving the LEFT lung base. 2. Marked cardiomegaly without pulmonary edema. Electronically Signed   By: TEvangeline DakinM.D.   On: 06/10/2018 16:50   Dg Abd Portable 1 View  Result Date: 06/10/2018 CLINICAL DATA:  Abdominal pain. EXAM: PORTABLE ABDOMEN - 1 VIEW COMPARISON:  None FINDINGS: The bowel gas pattern is normal. No radio-opaque calculi or other significant radiographic abnormality are seen. IMPRESSION: Negative. Electronically  Signed   By: TKerby MoorsM.D.   On: 06/10/2018 19:18   UK[place]Abdomen Limited Ruq  Result Date: 06/10/2018 CLINICAL DATA:  Epigastric pain, elevated LFTs EXAM: ULTRASOUND ABDOMEN LIMITED RIGHT UPPER QUADRANT COMPARISON:  None. FINDINGS: Gallbladder: No gallstones or wall thickening visualized. No sonographic Murphy sign noted by sonographer. Common bile duct: Diameter: 5 mm Liver: No focal lesion identified. Within normal limits in parenchymal echogenicity. Portal vein is patent on color Doppler imaging with normal direction of blood flow towards the liver. Other: Right pleural effusion. IMPRESSION: 1. No ultrasound abnormality of the right upper quadrant to explain pain. 2.  Right pleural effusion. Electronically Signed   By: AEddie CandleM.D.   On: 06/10/2018 19:33    Cardiac Studies   Echo ordered.   Patient Profile   Kerry Hamilton a 53y.o. male with a history of diet controlled diabetes and schizophrenia but no known cardiac history who is being seen today for evaluation of CHF at the request of Dr. GAlcario Drought(Internal Medicine).  Assessment & Plan    Presumed CHF - Patient presented with dyspnea on exertion, orthopnea, and abdominal fullness x1 week. - Chest CTA showed cardiomegaly  with bilateral pleural effusions and pulmonary edema but no evidence of PE. - BNP elevated at 1,634.1. - Echo pending. - Patient does not appear to have received any diuresis but one dose of IV Lasix 42m is ordered for this morning. Will order IV Lasix 429mtwice daily for now. - Will hold beta-blocker for now until acute phase of CHF has improved and EF can be assessed. - Consider starting low dose Losartan. - Continue to monitor daily weight, strict I/O's, and renal function.  Elevated Troponin - Troponin elevated but flat/ down trending at 0.51 >> 0.41. Continue serial troponin.  - EKG shows normal sinus rhythm with RBBB and T wave inversions in inferior and anterior leads.  - Patient denies any angina  and has no known history of CAD. - Currently on IV Heparin as well as aspirin. - Possibly secondary to demand ischemia due to acute CHF but patient may need further ischemic evaluation, especially if Echo shows reduced EF or wall motion abnormalities.   Abdominal Pain and Fullness - Patient reports abdominal pain and fullness.  - LFTs elevated (AST 65, ALT 87, Alk Phos 186) - Lipase normal. - Abdominal ultrasound showed no abnormality in RUQ to explain pain but did note right pleural effusion.  - Possible secondary to hepatic congestion. Diuresis as above.   Renal Insufficiency - Serum creatinine 1.33 on admission. Baseline unknown. - Continue to monitor.  Otherwise, per primary team.  For questions or updates, please contact [facility] consult [email or web address] for contact info under        Signed, CaDarreld McleanPA-C  06/11/2018, 7:57 AM

## 2018-06-11 NOTE — Plan of Care (Signed)
  Problem: Education: Goal: Understanding of CV disease, CV risk reduction, and recovery process will improve Outcome: Progressing   Problem: Activity: Goal: Ability to return to baseline activity level will improve Outcome: Progressing   Problem: Cardiovascular: Goal: Ability to achieve and maintain adequate cardiovascular perfusion will improve Outcome: Progressing   

## 2018-06-11 NOTE — Progress Notes (Addendum)
PROGRESS NOTE  Kerry Hamilton GRM:[id number] DOB: 03/04/1965 DOA: 06/10/2018 PCP: Default, Provider, MD  Brief summary: Kerry Hamilton is a 53 y.o. male with medical history significant of DM2, diet controlled, depression.  Patient presents to the ED with c/o 1 week history of "burning" quality substernal CP / epigastric AP.  Also had dark tarry BMs for past 6 days though he has been taking peptobismol for the pain.     HPI/Recap of past 24 hours:  On room air at rest, no cough, no hypoxia C/o epigastric discomfort  Assessment/Plan: Principal Problem:   ACS (acute coronary syndrome) (HCC) Active Problems:   DM2 (diabetes mellitus, type 2) (HCC)   Depression   Acute heart failure/pleural effusion He did not receive any lasix last night Will give lasix this am Order echocardiogram Will follow cardiology recommendation    CAD/NSTEMI Heparin drip/asa/lipitor  RBBB CTA negative for PE  Qtc prolongation Avoid Qt prolonging agent Hold home meds seroquel and risperdal Repeat EKG In am  aki vsCKDII  Monitor renal function Renal dosing meds  lft elevation, likely from congestion Ab [place] "1. No ultrasound abnormality of the right upper quadrant to explain Pain."   H/o diabetes, not on meds a1c 6.3   H/o polysubstance abuse H/o Cocaine H/o Alcohol  Cigarette  uds pending collection this time  Diet: heart health/carb modified/fluids restriction 1200cc  Code Status: full  Family Communication: patient   Disposition Plan: not ready to discharge   Consultants:  cardiology  Procedures:  none  Antibiotics:  none   Objective: BP (!) 129/94 (BP Location: Right Arm)    Pulse 94    Temp (!) 97.5 F (36.4 C) (Oral)    Resp (!) 32    Ht 6' (1.829 m)    Wt 91.8 kg    SpO2 92%    BMI 27.45 kg/m   Intake/Output Summary (Last 24 hours) at 06/11/2018 0715 Last data filed at 06/11/2018 0543 Gross per 24 hour  Intake 986.26 ml  Output 100 ml  Net 886.26 ml    Filed Weights   06/10/18 2300 06/11/18 0509  Weight: 90.1 kg 91.8 kg    Exam: Patient is examined daily including today on 06/11/2018, exams remain the same as of yesterday except that has changed    General:  NAD  Cardiovascular: RRR  Respiratory: bibasilar crackles   Abdomen: Soft/ND/NT, positive BS  Musculoskeletal: trace  Edema  Neuro: alert, oriented   Data Reviewed: Basic Metabolic Panel: Recent Labs  Lab 06/10/18 1649  NA 139  K 4.0  CL 110  CO2 20*  GLUCOSE 171*  BUN 25*  CREATININE 1.33*  CALCIUM 8.7*   Liver Function Tests: Recent Labs  Lab 06/10/18 1649  AST 65*  ALT 87*  ALKPHOS 186*  BILITOT 0.7  PROT 6.2*  ALBUMIN 3.5   Recent Labs  Lab 06/10/18 1649  LIPASE 29   No results for input(s): AMMONIA in the last 168 hours. CBC: Recent Labs  Lab 06/10/18 1649  WBC 5.0  NEUTROABS 2.2  HGB 13.4  HCT 41.1  MCV 86.2  PLT 238   Cardiac Enzymes:   Recent Labs  Lab 06/10/18 1649 06/10/18 2054  TROPONINI 0.51* 0.41*   BNP (last 3 results) Recent Labs    06/10/18 2052  BNP 1,634.1*    ProBNP (last 3 results) No results for input(s): PROBNP in the last 8760 hours.  CBG: Recent Labs  Lab 06/10/18 2109  GLUCAP 95    Recent Results (  from the past 240 hour(s))  SARS Coronavirus 2 (CEPHEID- Performed in [facility] hospital lab), Hosp Order     Status: None   Collection Time: 06/10/18  6:03 PM  Result Value Ref Range Status   SARS Coronavirus 2 NEGATIVE NEGATIVE Final    Comment: (NOTE) If result is NEGATIVE SARS-CoV-2 target nucleic acids are NOT DETECTED. The SARS-CoV-2 RNA is generally detectable in upper and lower  respiratory specimens during the acute phase of infection. The lowest  concentration of SARS-CoV-2 viral copies this assay can detect is 250  copies / mL. A negative result does not preclude SARS-CoV-2 infection  and should not be used as the sole basis for treatment or other  patient management decisions.   A negative result may occur with  improper specimen collection / handling, submission of specimen other  than nasopharyngeal swab, presence of viral mutation(s) within the  areas targeted by this assay, and inadequate number of viral copies  (<250 copies / mL). A negative result must be combined with clinical  observations, patient history, and epidemiological information. If result is POSITIVE SARS-CoV-2 target nucleic acids are DETECTED. The SARS-CoV-2 RNA is generally detectable in upper and lower  respiratory specimens dur ing the acute phase of infection.  Positive  results are indicative of active infection with SARS-CoV-2.  Clinical  correlation with patient history and other diagnostic information is  necessary to determine patient infection status.  Positive results do  not rule out bacterial infection or co-infection with other viruses. If result is PRESUMPTIVE POSTIVE SARS-CoV-2 nucleic acids MAY BE PRESENT.   A presumptive positive result was obtained on the submitted specimen  and confirmed on repeat testing.  While 2019 novel coronavirus  (SARS-CoV-2) nucleic acids may be present in the submitted sample  additional confirmatory testing may be necessary for epidemiological  and / or clinical management purposes  to differentiate between  SARS-CoV-2 and other Sarbecovirus currently known to infect humans.  If clinically indicated additional testing with an alternate test  methodology [phone]) is advised. The SARS-CoV-2 RNA is generally  detectable in upper and lower respiratory sp ecimens during the acute  phase of infection. The expected result is Negative. Fact Sheet for Patients:  BoilerBrush.com.cy Fact Sheet for Healthcare Providers: https://pope.com/ This test is not yet approved or cleared by the [place] FDA and has been authorized for detection and/or diagnosis of SARS-CoV-2 by FDA under an Emergency Use  Authorization (EUA).  This EUA will remain in effect (meaning this test can be used) for the duration of the COVID-19 declaration under Section 564(b)(1) of the Act, 21 U.S.C. section 360bbb-3(b)(1), unless the authorization is terminated or revoked sooner. Performed at [facility], [address] [place], [place] [address]      Studies: Ct Angio Chest Pe W Or Wo Contrast  Result Date: 06/11/2018 CLINICAL DATA:  Cough and shortness of breath. EXAM: CT ANGIOGRAPHY CHEST WITH CONTRAST TECHNIQUE: Multidetector CT imaging of the chest was performed using the standard protocol during bolus administration of intravenous contrast. Multiplanar CT image reconstructions and MIPs were obtained to evaluate the vascular anatomy. CONTRAST:  OMNIPAQUE IOHEXOL 350 MG/ML SOLN COMPARISON:  Radiograph yesterday FINDINGS: Cardiovascular: There are no filling defects within the pulmonary arteries to suggest pulmonary embolus. Subsegmental branches are not well assessed due to diminished contrast opacification. Prominent multi chamber cardiomegaly. Contrast refluxes into the hepatic veins and IVC. Persistent left SVC draining into the coronary sinus. Normal caliber thoracic aorta, cannot assess for  dissection given phase of contrast. No pericardial effusion. Mediastinum/Nodes: Enlarged right hilar node measures 19 mm short axis. Small left hilar and mediastinal nodes. Thyroid gland is not well-defined, may be heterogeneously enlarged, however difficult to differentiate from the adjacent soft tissues at the thoracic inlet. Esophagus is decompressed. Lungs/Pleura: Small to moderate right and small left pleural effusion. Adjacent compressive atelectasis. Mild smooth septal thickening and scattered ground-glass opacities consistent with pulmonary edema. Mild bronchial thickening may also be congestive. Upper Abdomen: Contrast refluxes into the hepatic veins and IVC. No other acute finding.  Musculoskeletal: There are no acute or suspicious osseous abnormalities. Review of the MIP images confirms the above findings. IMPRESSION: 1. No pulmonary embolus. 2. Findings consistent with congestive heart failure. Prominent cardiomegaly with bilateral pleural effusions and pulmonary edema. 3. Right hilar adenopathy is likely reactive. 4. Persistent left SVC draining into the coronary sinus. Electronically Signed   By: Narda Rutherford M.D.   On: 06/11/2018 03:20   Dg Chest Port 1 View  Result Date: 06/10/2018 CLINICAL DATA:  53 year old with one-week history of cough, shortness of breath, generalized abdominal pain and dark stools. EXAM: PORTABLE CHEST 1 VIEW COMPARISON:  None. FINDINGS: Cardiac silhouette markedly enlarged. Pulmonary vascularity normal. Streaky and patchy airspace opacities at the LEFT lung base. Lungs otherwise clear. No visible pleural effusions. IMPRESSION: 1. Atelectasis and/or bronchopneumonia involving the LEFT lung base. 2. Marked cardiomegaly without pulmonary edema. Electronically Signed   By: Hulan Saas M.D.   On: 06/10/2018 16:50   Dg Abd Portable 1 View  Result Date: 06/10/2018 CLINICAL DATA:  Abdominal pain. EXAM: PORTABLE ABDOMEN - 1 VIEW COMPARISON:  None FINDINGS: The bowel gas pattern is normal. No radio-opaque calculi or other significant radiographic abnormality are seen. IMPRESSION: Negative. Electronically Signed   By: Signa Kell M.D.   On: 06/10/2018 19:18   US Abdomen Limited Ruq  Result Date: 06/10/2018 CLINICAL DATA:  Epigastric pain, elevated LFTs EXAM: ULTRASOUND ABDOMEN LIMITED RIGHT UPPER QUADRANT COMPARISON:  None. FINDINGS: Gallbladder: No gallstones or wall thickening visualized. No sonographic Murphy sign noted by sonographer. Common bile duct: Diameter: 5 mm Liver: No focal lesion identified. Within normal limits in parenchymal echogenicity. Portal vein is patent on color Doppler imaging with normal direction of blood flow towards the  liver. Other: Right pleural effusion. IMPRESSION: 1. No ultrasound abnormality of the right upper quadrant to explain pain. 2.  Right pleural effusion. Electronically Signed   By: Lauralyn Primes M.D.   On: 06/10/2018 19:33    Scheduled Meds:  aspirin EC  81 mg Oral Daily   atorvastatin  40 mg Oral q1800   furosemide  40 mg Intravenous Once   insulin aspart  0-9 Units Subcutaneous Q4H    Continuous Infusions:  heparin 1,000 Units/hr (06/10/18 2103)     Time spent: I have personally reviewed and interpreted on  06/11/2018 daily labs, tele strips, imagings as discussed above under date review session and assessment and plans.  I reviewed all nursing notes, pharmacy notes, consultant notes,  vitals, pertinent old records  I have discussed plan of care as described above with RN , patient  on 06/11/2018   Albertine Grates MD, PhD  [facility] Pager [phone]. If 7PM-7AM, please contact night-coverage at [email or web address], password [facility] 06/11/2018, 7:15 AM  LOS: 1 day

## 2018-06-11 NOTE — Progress Notes (Signed)
ANTICOAGULATION CONSULT NOTE - Initial Consult  Pharmacy Consult for IV heparin Indication: chest pain/ACS  No Known Allergies  Patient Measurements: Height: 6' (182.9 cm) Weight: 202 lb 6.1 oz (91.8 kg) IBW/kg (Calculated) : 77.6 Heparin Dosing Weight: 90.1  Vital Signs: Temp: 98.1 F (36.7 C) (05/17 0748) Temp Source: Oral (05/17 0748) BP: 136/105 (05/17 0748) Pulse Rate: 98 (05/17 0748)  Labs: Recent Labs    06/10/18 1649 06/10/18 2054 06/11/18 0854  HGB 13.4  --  13.3  HCT 41.1  --  40.1  PLT 238  --  232  HEPARINUNFRC  --   --  <0.10*  CREATININE 1.33*  --  1.19  TROPONINI 0.51* 0.41*  --     Estimated Creatinine Clearance: 80.6 mL/min (by C-G formula based on SCr of 1.19 mg/dL).   Medical History: Past Medical History:  Diagnosis Date  . Depression   . Diabetes mellitus without complication (HCC)     Medications:  Scheduled:  . aspirin EC  81 mg Oral Daily  . atorvastatin  40 mg Oral q1800  . furosemide  40 mg Intravenous BID  . furosemide  40 mg Intravenous Once  . insulin aspart  0-9 Units Subcutaneous Q4H   Infusions:  . heparin 1,000 Units/hr (06/10/18 2103)    Assessment: 53 yo male presented to ED with abd pain, cough, shortness of breath and dark stools to start IV heparin for possible ACS. Baseline CBC and renal function drawn.  Heparin level subtherapeutic at <0.1 today. H/H 13.3/40.1, Plts 232. No problems with the heparin drip and no s/s of bleeding per nurse.   Goal of Therapy:  Heparin level 0.3-0.7 units/ml Monitor platelets by anticoagulation protocol: Yes   Plan:   2500 unit IV heparin re-bolus  Increase to 1300 units/hr IV heparin infusion rate  Check heparin level in 6 hours   Daily CBC, heparin level, and s/s of bleeding   Gwynneth Albright, [place] D PGY1 Pharmacy Resident  Phone [phone] 06/11/2018   10:39 AM

## 2018-06-12 DIAGNOSIS — I5021 Acute systolic (congestive) heart failure: Secondary | ICD-10-CM

## 2018-06-12 DIAGNOSIS — R945 Abnormal results of liver function studies: Secondary | ICD-10-CM

## 2018-06-12 DIAGNOSIS — I249 Acute ischemic heart disease, unspecified: Secondary | ICD-10-CM

## 2018-06-12 LAB — CBC
HCT: 40.7 % (ref 39.0–52.0)
Hemoglobin: 13.4 g/dL (ref 13.0–17.0)
MCH: 27.3 pg (ref 26.0–34.0)
MCHC: 32.9 g/dL (ref 30.0–36.0)
MCV: 83.1 fL (ref 80.0–100.0)
Platelets: 241 10*3/uL (ref 150–400)
RBC: 4.9 MIL/uL (ref 4.22–5.81)
RDW: 13.7 % (ref 11.5–15.5)
WBC: 5.3 10*3/uL (ref 4.0–10.5)
nRBC: 0 % (ref 0.0–0.2)

## 2018-06-12 LAB — RAPID URINE DRUG SCREEN, HOSP PERFORMED
Amphetamines: NOT DETECTED
Barbiturates: NOT DETECTED
Benzodiazepines: NOT DETECTED
Cocaine: NOT DETECTED
Opiates: NOT DETECTED
Tetrahydrocannabinol: NOT DETECTED

## 2018-06-12 LAB — HEPATITIS PANEL, ACUTE
HCV Ab: <0.1 s/co ratio (ref 0.0–0.9)
Hep A IgM: NEGATIVE
Hep B C IgM: NEGATIVE
Hepatitis B Surface Ag: NEGATIVE

## 2018-06-12 LAB — COMPREHENSIVE METABOLIC PANEL
ALT: 135 U/L — ABNORMAL HIGH (ref 0–44)
AST: 78 U/L — ABNORMAL HIGH (ref 15–41)
Albumin: 3 g/dL — ABNORMAL LOW (ref 3.5–5.0)
Alkaline Phosphatase: 145 U/L — ABNORMAL HIGH (ref 38–126)
Anion gap: 10 (ref 5–15)
BUN: 22 mg/dL — ABNORMAL HIGH (ref 6–20)
CO2: 22 mmol/L (ref 22–32)
Calcium: 8.2 mg/dL — ABNORMAL LOW (ref 8.9–10.3)
Chloride: 104 mmol/L (ref 98–111)
Creatinine, Ser: 1.45 mg/dL — ABNORMAL HIGH (ref 0.61–1.24)
GFR calc Af Amer: >60 mL/min (ref >60)
GFR calc non Af Amer: 55 mL/min — ABNORMAL LOW (ref >60)
Glucose, Bld: 175 mg/dL — ABNORMAL HIGH (ref 70–99)
Potassium: 3.6 mmol/L (ref 3.5–5.1)
Sodium: 136 mmol/L (ref 135–145)
Total Bilirubin: 0.8 mg/dL (ref 0.3–1.2)
Total Protein: 5.2 g/dL — ABNORMAL LOW (ref 6.5–8.1)

## 2018-06-12 LAB — GLUCOSE, CAPILLARY
Glucose-Capillary: 73 mg/dL (ref 70–99)
Glucose-Capillary: 112 mg/dL — ABNORMAL HIGH (ref 70–99)
Glucose-Capillary: 213 mg/dL — ABNORMAL HIGH (ref 70–99)
Glucose-Capillary: 130 mg/dL — ABNORMAL HIGH (ref 70–99)
Glucose-Capillary: 184 mg/dL — ABNORMAL HIGH (ref 70–99)

## 2018-06-12 LAB — HEPARIN LEVEL (UNFRACTIONATED): Heparin Unfractionated: 0.31 IU/mL (ref 0.30–0.70)

## 2018-06-12 LAB — MAGNESIUM: Magnesium: 1.9 mg/dL (ref 1.7–2.4)

## 2018-06-12 MED ORDER — SODIUM CHLORIDE 0.9 % WEIGHT BASED INFUSION: 1.0000 mL/kg/h | INTRAVENOUS | Status: DC

## 2018-06-12 MED ORDER — ASPIRIN 81 MG PO CHEW
81.0000 mg | CHEWABLE_TABLET | ORAL | Status: AC
  Administered 2018-06-13: 81 mg via ORAL
  Filled 2018-06-12: qty 1

## 2018-06-12 MED ORDER — SODIUM CHLORIDE 0.9% FLUSH
3.0000 mL | Freq: Two times a day (BID) | INTRAVENOUS | Status: DC
  Administered 2018-06-12: 3 mL via INTRAVENOUS

## 2018-06-12 MED ORDER — SODIUM CHLORIDE 0.9% FLUSH: 3.0000 mL | INTRAVENOUS | Status: DC | PRN

## 2018-06-12 MED ORDER — SODIUM CHLORIDE 0.9 % IV SOLN: 250.0000 mL | INTRAVENOUS | Status: DC | PRN

## 2018-06-12 MED ORDER — CARVEDILOL 3.125 MG PO TABS: 3.1250 mg | ORAL_TABLET | Freq: Two times a day (BID) | ORAL | Status: DC

## 2018-06-12 MED ORDER — FUROSEMIDE 10 MG/ML IJ SOLN
40.0000 mg | Freq: Once | INTRAMUSCULAR | Status: AC
  Administered 2018-06-12: 40 mg via INTRAVENOUS
  Filled 2018-06-12: qty 4

## 2018-06-12 MED ORDER — SODIUM CHLORIDE 0.9 % WEIGHT BASED INFUSION: 3.0000 mL/kg/h | INTRAVENOUS | Status: AC

## 2018-06-12 NOTE — Plan of Care (Signed)
  Problem: Education: Goal: Understanding of CV disease, CV risk reduction, and recovery process will improve Outcome: Progressing   Problem: Activity: Goal: Ability to return to baseline activity level will improve Outcome: Progressing   Problem: Cardiovascular: Goal: Ability to achieve and maintain adequate cardiovascular perfusion will improve Outcome: Progressing   Problem: Health Behavior/Discharge Planning: Goal: Ability to safely manage health-related needs after discharge will improve Outcome: Progressing   Problem: Education: Goal: Knowledge of General Education information will improve Description Including pain rating scale, medication(s)/side effects and non-pharmacologic comfort measures Outcome: Progressing   Problem: Coping: Goal: Level of anxiety will decrease Outcome: Progressing   Problem: Pain Managment: Goal: General experience of comfort will improve Outcome: Progressing

## 2018-06-12 NOTE — H&P (View-Only) (Signed)
The patient has been seen in conjunction with Laverda Page, NP. All aspects of care have been considered and discussed. The patient has been personally interviewed, examined, and all clinical data has been reviewed.   Dilated LV without LVH.  Presentation likely acute on chronic systolic heart failure.  Etiology of decreased LV function is unclear.  Start low-dose beta-blocker today  Hold IV diuresis due to increasing creatinine now 1.45.  Will check kidney function early a.m. 06/12/2018.  Left and right heart cath with coronary angiogram tomorrow.  Start long-acting nitrate therapy for the time being with transition to [facility] based upon kidney function and hemodynamics.  Procedure and risks were discussed with the patient in detail including stroke, death, myocardial infarction, kidney injury, bleeding, among others.  Patient is willing to proceed.    Progress Note  Patient Name: Kerry Hamilton Date of Encounter: 06/12/2018  Primary Cardiologist: Chrystie Nose, MD   Subjective   Feels better.  Was able to lie relatively flat last evening.  Cough is improving.  Significant bump in creatinine to 1.45.  States he has occasional tightness in his chest and gives "the [place] sign" when describing.  He is a smoker.  Inpatient Medications    Scheduled Meds:  aspirin EC  81 mg Oral Daily   atorvastatin  40 mg Oral q1800   furosemide  40 mg Intravenous BID   insulin aspart  0-9 Units Subcutaneous Q4H   nicotine  21 mg Transdermal Daily   senna-docusate  1 tablet Oral QHS   Continuous Infusions:  heparin 1,300 Units/hr (06/12/18 0803)   PRN Meds: acetaminophen, alum & mag hydroxide-simeth, nitroGLYCERIN   Vital Signs    Vitals:   06/11/18 1619 06/11/18 2006 06/12/18 0442 06/12/18 0452  BP: (!) 121/93 116/79 118/90   Pulse: 92 92 84   Resp: 19 (!) 22 20   Temp: 98 F (36.7 C) 98.8 F (37.1 C) (!) 97.2 F (36.2 C)   TempSrc: Oral Oral Oral   SpO2: 99% 95% 97%     Weight:    84.1 kg  Height:        Intake/Output Summary (Last 24 hours) at 06/12/2018 0819 Last data filed at 06/12/2018 0600 Gross per 24 hour  Intake 992.83 ml  Output 5000 ml  Net -4007.17 ml   Last 3 Weights 06/12/2018 06/11/2018 06/10/2018  Weight (lbs) 185 lb 6.4 oz 202 lb 6.1 oz 198 lb 10.2 oz  Weight (kg) 84.097 kg 91.8 kg 90.1 kg      Telemetry    Sinus rhythm with occasional PVC- Personally Reviewed  ECG    ST with RBBB, prominent voltage.- Personally Reviewed  Physical Exam  African-American male, relatively slender GEN: No acute distress.   Neck: Difficult to assess jugular veins.  Do not appear to be grossly elevated. Cardiac: RRR, no murmurs, rubs, or gallops.  Respiratory: Clear to auscultation bilaterally. GI: Soft, nontender, non-distended  MS: No edema; No deformity. Neuro:  Nonfocal  Psych: Normal affect   Labs    Chemistry Recent Labs  Lab 06/10/18 1649 06/11/18 0854 06/12/18 0552  NA 139 138 136  K 4.0 4.5 3.6  CL 110 107 104  CO2 20* 16* 22  GLUCOSE 171* 124* 175*  BUN 25* 22* 22*  CREATININE 1.33* 1.19 1.45*  CALCIUM 8.7* 8.7* 8.2*  PROT 6.2* 5.8* 5.2*  ALBUMIN 3.5 3.3* 3.0*  AST 65* 114* 78*  ALT 87* 139* 135*  ALKPHOS 186* 169* 145*  BILITOT 0.7  1.5* 0.8  °GFRNONAA >60 >60 55*  °GFRAA >60 >60 >60  °ANIONGAP 9 15 10  °  ° °Hematology °Recent Labs  °Lab 06/10/18 °1649 06/11/18 °0854 06/12/18 °0552  °WBC 5.0 5.1 5.3  °RBC 4.77 4.84 4.90  °HGB 13.4 13.3 13.4  °HCT 41.1 40.1 40.7  °MCV 86.2 82.9 83.1  °MCH 28.1 27.5 27.3  °MCHC 32.6 33.2 32.9  °RDW 13.9 13.7 13.7  °PLT 238 232 241  ° ° °Cardiac Enzymes °Recent Labs  °Lab 06/10/18 °1649 06/10/18 °2054  °TROPONINI 0.51* 0.41*  ° No results for input(s): TROPIPOC in the last 168 hours.  ° °BNP °Recent Labs  °Lab 06/10/18 °2052  °BNP 1,634.1*  °  ° °DDimer  °Recent Labs  °Lab 06/10/18 °1649  °DDIMER 0.81*  °  ° °Radiology  °  °Ct Angio Chest Pe W Or Wo Contrast ° °Result Date: 06/11/2018 °CLINICAL  DATA:  Cough and shortness of breath. EXAM: CT ANGIOGRAPHY CHEST WITH CONTRAST TECHNIQUE: Multidetector CT imaging of the chest was performed using the standard protocol during bolus administration of intravenous contrast. Multiplanar CT image reconstructions and MIPs were obtained to evaluate the vascular anatomy. CONTRAST:  100mL OMNIPAQUE IOHEXOL 350 MG/ML SOLN COMPARISON:  Radiograph yesterday FINDINGS: Cardiovascular: There are no filling defects within the pulmonary arteries to suggest pulmonary embolus. Subsegmental branches are not well assessed due to diminished contrast opacification. Prominent multi chamber cardiomegaly. Contrast refluxes into the hepatic veins and IVC. Persistent left SVC draining into the coronary sinus. Normal caliber thoracic aorta, cannot assess for dissection given phase of contrast. No pericardial effusion. Mediastinum/Nodes: Enlarged right hilar node measures 19 mm short axis. Small left hilar and mediastinal nodes. Thyroid gland is not well-defined, may be heterogeneously enlarged, however difficult to differentiate from the adjacent soft tissues at the thoracic inlet. Esophagus is decompressed. Lungs/Pleura: Small to moderate right and small left pleural effusion. Adjacent compressive atelectasis. Mild smooth septal thickening and scattered ground-glass opacities consistent with pulmonary edema. Mild bronchial thickening may also be congestive. Upper Abdomen: Contrast refluxes into the hepatic veins and IVC. No other acute finding. Musculoskeletal: There are no acute or suspicious osseous abnormalities. Review of the MIP images confirms the above findings. IMPRESSION: 1. No pulmonary embolus. 2. Findings consistent with congestive heart failure. Prominent cardiomegaly with bilateral pleural effusions and pulmonary edema. 3. Right hilar adenopathy is likely reactive. 4. Persistent left SVC draining into the coronary sinus. Electronically Signed   By: Melanie  Sanford M.D.   On:  06/11/2018 03:20  ° °Dg Chest Port 1 View ° °Result Date: 06/10/2018 °CLINICAL DATA:  52-year-old with one-week history of cough, shortness of breath, generalized abdominal pain and dark stools. EXAM: PORTABLE CHEST 1 VIEW COMPARISON:  None. FINDINGS: Cardiac silhouette markedly enlarged. Pulmonary vascularity normal. Streaky and patchy airspace opacities at the LEFT lung base. Lungs otherwise clear. No visible pleural effusions. IMPRESSION: 1. Atelectasis and/or bronchopneumonia involving the LEFT lung base. 2. Marked cardiomegaly without pulmonary edema. Electronically Signed   By: Thomas  Lawrence M.D.   On: 06/10/2018 16:50  ° °Dg Abd Portable 1 View ° °Result Date: 06/10/2018 °CLINICAL DATA:  Abdominal pain. EXAM: PORTABLE ABDOMEN - 1 VIEW COMPARISON:  None FINDINGS: The bowel gas pattern is normal. No radio-opaque calculi or other significant radiographic abnormality are seen. IMPRESSION: Negative. Electronically Signed   By: Taylor  Stroud M.D.   On: 06/10/2018 19:18  ° °Us Abdomen Limited Ruq ° °Result Date: 06/10/2018 °CLINICAL DATA:  Epigastric pain, elevated LFTs EXAM: ULTRASOUND ABDOMEN   LIMITED RIGHT UPPER QUADRANT COMPARISON:  None. FINDINGS: Gallbladder: No gallstones or wall thickening visualized. No sonographic Murphy sign noted by sonographer. Common bile duct: Diameter: 5 mm Liver: No focal lesion identified. Within normal limits in parenchymal echogenicity. Portal vein is patent on color Doppler imaging with normal direction of blood flow towards the liver. Other: Right pleural effusion. IMPRESSION: 1. No ultrasound abnormality of the right upper quadrant to explain pain. 2.  Right pleural effusion. Electronically Signed   By: Lauralyn Primes M.D.   On: 06/10/2018 19:33    Cardiac Studies   TTE: 06/11/18  IMPRESSIONS    1. The left ventricle has severely reduced systolic function, with an ejection fraction of 15%. The cavity size was severely dilated. Left ventricular diastolic Doppler  parameters are consistent with pseudonormalization.  2. There is spontaneous echo contrast in the LV cavity but no obvious LV thrombus.  3. The right ventricle has moderately reduced systolic function. The cavity was severely enlarged. There is no increase in right ventricular wall thickness. Right ventricular systolic pressure is moderately elevated with an estimated pressure of 56.2  mmHg.  4. Left atrial size was severely dilated.  5. Right atrial size was moderately dilated.  6. Moderate pleural effusion in the left lateral region.  7. The mitral valve is degenerative. Mild thickening of the anterior and posterior mitral valve leaflets. Mitral valve regurgitation is moderate by color flow Doppler. The MR jet is eccentric posteriorly directed.  8. Tricuspid valve regurgitation is moderate.  9. The aortic valve is tricuspid. Mild sclerosis of the aortic valve. 10. The inferior vena cava was dilated in size with <50% respiratory variability.  Patient Profile     53 y.o. male with PMH of diet controlled diabetes, and schizophrenia who presented with CHF.   Assessment & Plan    1. Acute systolic HF: EF noted at 15% on echo yesterday. BNP >1600. Net- 3.8L. Weight trending down 185.4lbs. Will need R/LHC to evaluate for ischemia mediated HF. Remains on IV lasix. BB therapy held initially 2/2 to wheezing and shortness of breath. Would favor adding coreg when appropriate. May be good Entresto candidate once Cr stable.  -- will review with MD timing for cardiac cath  2. NSTEMI: troponin peaking at 0.51. Could be 2/2 to CHF but need to rule out underlying CAD. Plans for cath as above.  -- on ASA, and IV heparin  3. AKI: Cr with mild increase to 1.45 today in the setting of diuresis. No room for ACE/ARB therapy at this time. -- daily BMET.  4. Elevated LFTs: suspect 2/2 to hepatic congestion in the setting of acute CHF. Now trending down.  -- follow LFTs as he is also on statin therapy  5. Diet  controlled DM: Hgb a1c 6.3. On SSI.   For questions or updates, please contact [facility] Please consult [email or web address] for contact info under        Signed, Laverda Page, NP  06/12/2018, 8:19 AM

## 2018-06-12 NOTE — Progress Notes (Signed)
PROGRESS NOTE  Bowie Scelsi STM:[id number] DOB: Mar 10, 1965 DOA: 06/10/2018 PCP: Default, Provider, MD   LOS: 2 days   Brief narrative: Kerry Hamilton a 53 y.o.malewith medical history significant ofDM2, diet controlled, depression.  Patient presents to the ED with c/o 1 week history of "burning" quality substernal CP / epigastric AP. Also had dark tarry BMs for past 6 days though he has been taking peptobismol for the pain.  Subjective: Patient was seen and examined this morning.  [name].  Sitting up in bed.  Not in distress.  Assessment/Plan:  Principal Problem:   ACS (acute coronary syndrome) (HCC) Active Problems:   Diabetes mellitus type 2, noninsulin dependent (HCC)   Depression   Acute heart failure (HCC)   Elevated troponin   Shortness of breath   LFT elevation   Prolonged QT interval   Polysubstance abuse (HCC)  Acute heart failure/pleural effusion Dilated LV without LVH.  EF 15% unclear cause of decreased LV function.  On diuresis.  Held now because of AKI. Noted cardiology consult with a plan to do left and right heart cath tomorrow.  CAD/NSTEMI Heparin drip/asa/lipitor  RBBB CTA negative for PE  Qtc prolongation Avoid Qt prolonging agent Hold home meds seroquel and risperdal Repeat EKG In am  aki vsCKDII  Monitor renal function Renal dosing meds  lft elevation, likely from congestion Ab [place] "1. No ultrasound abnormality of the right upper quadrant to explain Pain."  H/o diabetes, not on meds a1c 6.3  H/o polysubstance abuse H/o Cocaine H/o Alcohol  Cigarette  uds pending collection this time  Diet: heart health/carb modified/fluids restriction 1200cc  Body mass index is 25.14 kg/m. Mobility: Encourage ambulation Diet: Low-sodium diet DVT prophylaxis:  Heparin infusion Code Status:   Code Status: Full Code  Family Communication:  Disposition Plan:  None  Consultants:  Cardiology   Procedures:    Antimicrobials:  Anti-infectives (From admission, onward)   None      Infusions:  . heparin 1,300 Units/hr (06/12/18 0803)    Scheduled Meds: . aspirin EC  81 mg Oral Daily  . atorvastatin  40 mg Oral q1800  . insulin aspart  0-9 Units Subcutaneous Q4H  . nicotine  21 mg Transdermal Daily  . senna-docusate  1 tablet Oral QHS    PRN meds: acetaminophen, alum & mag hydroxide-simeth, nitroGLYCERIN   Objective: Vitals:   06/12/18 0442 06/12/18 1327  BP: 118/90 (!) 121/100  Pulse: 84 93  Resp: 20   Temp: (!) 97.2 F (36.2 C) (!) 97.5 F (36.4 C)  SpO2: 97% 100%    Intake/Output Summary (Last 24 hours) at 06/12/2018 1607 Last data filed at 06/12/2018 1327 Gross per 24 hour  Intake 820 ml  Output 3200 ml  Net -2380 ml   Filed Weights   06/10/18 2300 06/11/18 0509 06/12/18 0452  Weight: 90.1 kg 91.8 kg 84.1 kg   Weight change: -6.003 kg Body mass index is 25.14 kg/m.   Physical Exam: General exam: Appears calm and comfortable.  Not in distress at rest Skin: No rashes, lesions or ulcers. HEENT: Atraumatic, normocephalic, supple neck, no obvious bleeding Lungs: Clear to auscultation bilaterally CVS: Regular rate and rhythm, no murmur GI/Abd soft, nondistended, nontender, bowel sound present CNS: Alert, awake and oriented x3 Psychiatry: Mood & affect appropriate.  Extremities: Trace bilateral pedal edema, no calf tenderness   Data Review: I have personally reviewed the laboratory data and studies available.  Recent Labs  Lab 06/10/18 1649 06/11/18 8921 06/12/18 1941  WBC 5.0 5.1 5.3  NEUTROABS 2.2  --   --   HGB 13.4 13.3 13.4  HCT 41.1 40.1 40.7  MCV 86.2 82.9 83.1  PLT 238 232 241   Recent Labs  Lab 06/10/18 1649 06/11/18 0854 06/12/18 0552  NA 139 138 136  K 4.0 4.5 3.6  CL 110 107 104  CO2 20* 16* 22  GLUCOSE 171* 124* 175*  BUN 25* 22* 22*  CREATININE 1.33* 1.19 1.45*  CALCIUM 8.7* 8.7* 8.2*  MG  --   --  1.9     Lorin Glass, MD  [facility] 06/12/2018

## 2018-06-12 NOTE — Progress Notes (Signed)
The patient has been seen in conjunction with Laverda Page, NP. All aspects of care have been considered and discussed. The patient has been personally interviewed, examined, and all clinical data has been reviewed.   Dilated LV without LVH.  Presentation likely acute on chronic systolic heart failure.  Etiology of decreased LV function is unclear.  Start low-dose beta-blocker today  Hold IV diuresis due to increasing creatinine now 1.45.  Will check kidney function early a.m. 06/12/2018.  Left and right heart cath with coronary angiogram tomorrow.  Start long-acting nitrate therapy for the time being with transition to [facility] based upon kidney function and hemodynamics.  Procedure and risks were discussed with the patient in detail including stroke, death, myocardial infarction, kidney injury, bleeding, among others.  Patient is willing to proceed.    Progress Note  Patient Name: Kerry Hamilton Date of Encounter: 06/12/2018  Primary Cardiologist: Chrystie Nose, MD   Subjective   Feels better.  Was able to lie relatively flat last evening.  Cough is improving.  Significant bump in creatinine to 1.45.  States he has occasional tightness in his chest and gives "the [place] sign" when describing.  He is a smoker.  Inpatient Medications    Scheduled Meds:  aspirin EC  81 mg Oral Daily   atorvastatin  40 mg Oral q1800   furosemide  40 mg Intravenous BID   insulin aspart  0-9 Units Subcutaneous Q4H   nicotine  21 mg Transdermal Daily   senna-docusate  1 tablet Oral QHS   Continuous Infusions:  heparin 1,300 Units/hr (06/12/18 0803)   PRN Meds: acetaminophen, alum & mag hydroxide-simeth, nitroGLYCERIN   Vital Signs    Vitals:   06/11/18 1619 06/11/18 2006 06/12/18 0442 06/12/18 0452  BP: (!) 121/93 116/79 118/90   Pulse: 92 92 84   Resp: 19 (!) 22 20   Temp: 98 F (36.7 C) 98.8 F (37.1 C) (!) 97.2 F (36.2 C)   TempSrc: Oral Oral Oral   SpO2: 99% 95% 97%     Weight:    84.1 kg  Height:        Intake/Output Summary (Last 24 hours) at 06/12/2018 0819 Last data filed at 06/12/2018 0600 Gross per 24 hour  Intake 992.83 ml  Output 5000 ml  Net -4007.17 ml   Last 3 Weights 06/12/2018 06/11/2018 06/10/2018  Weight (lbs) 185 lb 6.4 oz 202 lb 6.1 oz 198 lb 10.2 oz  Weight (kg) 84.097 kg 91.8 kg 90.1 kg      Telemetry    Sinus rhythm with occasional PVC- Personally Reviewed  ECG    ST with RBBB, prominent voltage.- Personally Reviewed  Physical Exam  African-American male, relatively slender GEN: No acute distress.   Neck: Difficult to assess jugular veins.  Do not appear to be grossly elevated. Cardiac: RRR, no murmurs, rubs, or gallops.  Respiratory: Clear to auscultation bilaterally. GI: Soft, nontender, non-distended  MS: No edema; No deformity. Neuro:  Nonfocal  Psych: Normal affect   Labs    Chemistry Recent Labs  Lab 06/10/18 1649 06/11/18 0854 06/12/18 0552  NA 139 138 136  K 4.0 4.5 3.6  CL 110 107 104  CO2 20* 16* 22  GLUCOSE 171* 124* 175*  BUN 25* 22* 22*  CREATININE 1.33* 1.19 1.45*  CALCIUM 8.7* 8.7* 8.2*  PROT 6.2* 5.8* 5.2*  ALBUMIN 3.5 3.3* 3.0*  AST 65* 114* 78*  ALT 87* 139* 135*  ALKPHOS 186* 169* 145*  BILITOT 0.7  1.5* 0.8  GFRNONAA >60 >60 55*  GFRAA >60 >60 >60  ANIONGAP 9 15 10      Hematology Recent Labs  Lab 06/10/18 1649 06/11/18 0854 06/12/18 0552  WBC 5.0 5.1 5.3  RBC 4.77 4.84 4.90  HGB 13.4 13.3 13.4  HCT 41.1 40.1 40.7  MCV 86.2 82.9 83.1  MCH 28.1 27.5 27.3  MCHC 32.6 33.2 32.9  RDW 13.9 13.7 13.7  PLT 238 232 241    Cardiac Enzymes Recent Labs  Lab 06/10/18 1649 06/10/18 2054  TROPONINI 0.51* 0.41*   No results for input(s): TROPIPOC in the last 168 hours.   BNP Recent Labs  Lab 06/10/18 2052  BNP 1,634.1*     DDimer  Recent Labs  Lab 06/10/18 1649  DDIMER 0.81*     Radiology    Ct Angio Chest Pe W Or Wo Contrast  Result Date: 06/11/2018 CLINICAL  DATA:  Cough and shortness of breath. EXAM: CT ANGIOGRAPHY CHEST WITH CONTRAST TECHNIQUE: Multidetector CT imaging of the chest was performed using the standard protocol during bolus administration of intravenous contrast. Multiplanar CT image reconstructions and MIPs were obtained to evaluate the vascular anatomy. CONTRAST:  100mL OMNIPAQUE IOHEXOL 350 MG/ML SOLN COMPARISON:  Radiograph yesterday FINDINGS: Cardiovascular: There are no filling defects within the pulmonary arteries to suggest pulmonary embolus. Subsegmental branches are not well assessed due to diminished contrast opacification. Prominent multi chamber cardiomegaly. Contrast refluxes into the hepatic veins and IVC. Persistent left SVC draining into the coronary sinus. Normal caliber thoracic aorta, cannot assess for dissection given phase of contrast. No pericardial effusion. Mediastinum/Nodes: Enlarged right hilar node measures 19 mm short axis. Small left hilar and mediastinal nodes. Thyroid gland is not well-defined, may be heterogeneously enlarged, however difficult to differentiate from the adjacent soft tissues at the thoracic inlet. Esophagus is decompressed. Lungs/Pleura: Small to moderate right and small left pleural effusion. Adjacent compressive atelectasis. Mild smooth septal thickening and scattered ground-glass opacities consistent with pulmonary edema. Mild bronchial thickening may also be congestive. Upper Abdomen: Contrast refluxes into the hepatic veins and IVC. No other acute finding. Musculoskeletal: There are no acute or suspicious osseous abnormalities. Review of the MIP images confirms the above findings. IMPRESSION: 1. No pulmonary embolus. 2. Findings consistent with congestive heart failure. Prominent cardiomegaly with bilateral pleural effusions and pulmonary edema. 3. Right hilar adenopathy is likely reactive. 4. Persistent left SVC draining into the coronary sinus. Electronically Signed   By: Narda RutherfordMelanie  Sanford M.D.   On:  06/11/2018 03:20   Dg Chest Port 1 View  Result Date: 06/10/2018 CLINICAL DATA:  53 year old with one-week history of cough, shortness of breath, generalized abdominal pain and dark stools. EXAM: PORTABLE CHEST 1 VIEW COMPARISON:  None. FINDINGS: Cardiac silhouette markedly enlarged. Pulmonary vascularity normal. Streaky and patchy airspace opacities at the LEFT lung base. Lungs otherwise clear. No visible pleural effusions. IMPRESSION: 1. Atelectasis and/or bronchopneumonia involving the LEFT lung base. 2. Marked cardiomegaly without pulmonary edema. Electronically Signed   By: Hulan Saashomas  Lawrence M.D.   On: 06/10/2018 16:50   Dg Abd Portable 1 View  Result Date: 06/10/2018 CLINICAL DATA:  Abdominal pain. EXAM: PORTABLE ABDOMEN - 1 VIEW COMPARISON:  None FINDINGS: The bowel gas pattern is normal. No radio-opaque calculi or other significant radiographic abnormality are seen. IMPRESSION: Negative. Electronically Signed   By: Signa Kellaylor  Stroud M.D.   On: 06/10/2018 19:18   [place] Abdomen Limited Ruq  Result Date: 06/10/2018 CLINICAL DATA:  Epigastric pain, elevated LFTs EXAM: ULTRASOUND ABDOMEN  LIMITED RIGHT UPPER QUADRANT COMPARISON:  None. FINDINGS: Gallbladder: No gallstones or wall thickening visualized. No sonographic Murphy sign noted by sonographer. Common bile duct: Diameter: 5 mm Liver: No focal lesion identified. Within normal limits in parenchymal echogenicity. Portal vein is patent on color Doppler imaging with normal direction of blood flow towards the liver. Other: Right pleural effusion. IMPRESSION: 1. No ultrasound abnormality of the right upper quadrant to explain pain. 2.  Right pleural effusion. Electronically Signed   By: Lauralyn Primes M.D.   On: 06/10/2018 19:33    Cardiac Studies   TTE: 06/11/18  IMPRESSIONS    1. The left ventricle has severely reduced systolic function, with an ejection fraction of 15%. The cavity size was severely dilated. Left ventricular diastolic Doppler  parameters are consistent with pseudonormalization.  2. There is spontaneous echo contrast in the LV cavity but no obvious LV thrombus.  3. The right ventricle has moderately reduced systolic function. The cavity was severely enlarged. There is no increase in right ventricular wall thickness. Right ventricular systolic pressure is moderately elevated with an estimated pressure of 56.2  mmHg.  4. Left atrial size was severely dilated.  5. Right atrial size was moderately dilated.  6. Moderate pleural effusion in the left lateral region.  7. The mitral valve is degenerative. Mild thickening of the anterior and posterior mitral valve leaflets. Mitral valve regurgitation is moderate by color flow Doppler. The MR jet is eccentric posteriorly directed.  8. Tricuspid valve regurgitation is moderate.  9. The aortic valve is tricuspid. Mild sclerosis of the aortic valve. 10. The inferior vena cava was dilated in size with <50% respiratory variability.  Patient Profile     53 y.o. male with PMH of diet controlled diabetes, and schizophrenia who presented with CHF.   Assessment & Plan    1. Acute systolic HF: EF noted at 15% on echo yesterday. BNP >1600. Net- 3.8L. Weight trending down 185.4lbs. Will need R/LHC to evaluate for ischemia mediated HF. Remains on IV lasix. BB therapy held initially 2/2 to wheezing and shortness of breath. Would favor adding coreg when appropriate. May be good Entresto candidate once Cr stable.  -- will review with MD timing for cardiac cath  2. NSTEMI: troponin peaking at 0.51. Could be 2/2 to CHF but need to rule out underlying CAD. Plans for cath as above.  -- on ASA, and IV heparin  3. AKI: Cr with mild increase to 1.45 today in the setting of diuresis. No room for ACE/ARB therapy at this time. -- daily BMET.  4. Elevated LFTs: suspect 2/2 to hepatic congestion in the setting of acute CHF. Now trending down.  -- follow LFTs as he is also on statin therapy  5. Diet  controlled DM: Hgb a1c 6.3. On SSI.   For questions or updates, please contact [facility] Please consult [email or web address] for contact info under        Signed, Laverda Page, NP  06/12/2018, 8:19 AM

## 2018-06-12 NOTE — Progress Notes (Signed)
ANTICOAGULATION CONSULT NOTE - Follow up Pharmacy Consult for IV heparin Indication: chest pain/ACS  No Known Allergies  Patient Measurements: Height: 6' (182.9 cm) Weight: 185 lb 6.4 oz (84.1 kg) IBW/kg (Calculated) : 77.6 Heparin Dosing Weight: 84.1 kg Vital Signs: Temp: 97.2 F (36.2 C) (05/18 0442) Temp Source: Oral (05/18 0442) BP: 118/90 (05/18 0442) Pulse Rate: 84 (05/18 0442)  Labs: Recent Labs    06/10/18 1649 06/10/18 2054 06/11/18 0854 06/11/18 1640 06/12/18 0552  HGB 13.4  --  13.3  --  13.4  HCT 41.1  --  40.1  --  40.7  PLT 238  --  232  --  241  HEPARINUNFRC  --   --  <0.10* 0.42 0.31  CREATININE 1.33*  --  1.19  --  1.45*  TROPONINI 0.51* 0.41*  --   --   --     Estimated Creatinine Clearance: 66.2 mL/min (A) (by C-G formula based on SCr of 1.45 mg/dL (H)).   Medical History: Past Medical History:  Diagnosis Date  . Depression   . Diabetes mellitus without complication (HCC)     Medications:  Scheduled:  . aspirin EC  81 mg Oral Daily  . atorvastatin  40 mg Oral q1800  . furosemide  40 mg Intravenous Once  . insulin aspart  0-9 Units Subcutaneous Q4H  . nicotine  21 mg Transdermal Daily  . senna-docusate  1 tablet Oral QHS   Infusions:  . heparin 1,300 Units/hr (06/12/18 0803)    Assessment: 52 yoM presenting with burning substernal chest pain and epigastric abdominal pain x1 week. Pharmacy consulted to dose IV heparin for r/o ACS. Plan for [facility] this week.  Heparin level = 0.31, therapeutic on heparin gtt 1300 ut/hr CBC WNL. No bleeding noted.   Goal of Therapy:  Heparin level 0.3-0.7 units/ml Monitor platelets by anticoagulation protocol: Yes   Plan:  Continue heparin gtt at 1300 units/hr Daily heparin level and CBC Monitor s/sx of bleeding  Noah Delaine, RPh Clinical Pharmacist [phone] Pager: [phone] Noah Delaine, RPh Clinical Pharmacist Pager: [phone] 06/12/2018   12:22 PM

## 2018-06-13 ENCOUNTER — Encounter (HOSPITAL_COMMUNITY): Payer: Self-pay | Admitting: Cardiology

## 2018-06-13 ENCOUNTER — Encounter (HOSPITAL_COMMUNITY)
Admission: EM | Disposition: A | Discharge status: Home Health | Payer: Self-pay | Source: Home / Self Care | Attending: Internal Medicine

## 2018-06-13 DIAGNOSIS — E119 Type 2 diabetes mellitus without complications: Secondary | ICD-10-CM

## 2018-06-13 HISTORY — PX: RIGHT/LEFT HEART CATH AND CORONARY ANGIOGRAPHY: CATH118266

## 2018-06-13 LAB — CBC
HCT: 40.9 % (ref 39.0–52.0)
Hemoglobin: 13.9 g/dL (ref 13.0–17.0)
MCH: 28 pg (ref 26.0–34.0)
MCHC: 34 g/dL (ref 30.0–36.0)
MCV: 82.3 fL (ref 80.0–100.0)
Platelets: 234 10*3/uL (ref 150–400)
RBC: 4.97 MIL/uL (ref 4.22–5.81)
RDW: 13.7 % (ref 11.5–15.5)
WBC: 5 10*3/uL (ref 4.0–10.5)
nRBC: 0 % (ref 0.0–0.2)

## 2018-06-13 LAB — BASIC METABOLIC PANEL
Anion gap: 10 (ref 5–15)
BUN: 18 mg/dL (ref 6–20)
CO2: 22 mmol/L (ref 22–32)
Calcium: 8.6 mg/dL — ABNORMAL LOW (ref 8.9–10.3)
Chloride: 102 mmol/L (ref 98–111)
Creatinine, Ser: 1.2 mg/dL (ref 0.61–1.24)
GFR calc Af Amer: >60 mL/min (ref >60)
GFR calc non Af Amer: >60 mL/min (ref >60)
Glucose, Bld: 78 mg/dL (ref 70–99)
Potassium: 3.6 mmol/L (ref 3.5–5.1)
Sodium: 134 mmol/L — ABNORMAL LOW (ref 135–145)

## 2018-06-13 LAB — GLUCOSE, CAPILLARY
Glucose-Capillary: 111 mg/dL — ABNORMAL HIGH (ref 70–99)
Glucose-Capillary: 135 mg/dL — ABNORMAL HIGH (ref 70–99)
Glucose-Capillary: 142 mg/dL — ABNORMAL HIGH (ref 70–99)
Glucose-Capillary: 149 mg/dL — ABNORMAL HIGH (ref 70–99)
Glucose-Capillary: 168 mg/dL — ABNORMAL HIGH (ref 70–99)
Glucose-Capillary: 184 mg/dL — ABNORMAL HIGH (ref 70–99)
Glucose-Capillary: 206 mg/dL — ABNORMAL HIGH (ref 70–99)
Glucose-Capillary: 232 mg/dL — ABNORMAL HIGH (ref 70–99)
Glucose-Capillary: 78 mg/dL (ref 70–99)
Glucose-Capillary: 82 mg/dL (ref 70–99)
Glucose-Capillary: 87 mg/dL (ref 70–99)

## 2018-06-13 LAB — POCT I-STAT EG7
Acid-base deficit: 1 mmol/L (ref 0.0–2.0)
Bicarbonate: 23.7 mmol/L (ref 20.0–28.0)
Calcium, Ion: 1.08 mmol/L — ABNORMAL LOW (ref 1.15–1.40)
HCT: 42 % (ref 39.0–52.0)
Hemoglobin: 14.3 g/dL (ref 13.0–17.0)
O2 Saturation: 65 %
Potassium: 3.8 mmol/L (ref 3.5–5.1)
Sodium: 137 mmol/L (ref 135–145)
TCO2: 25 mmol/L (ref 22–32)
pCO2, Ven: 38.1 mmHg — ABNORMAL LOW (ref 44.0–60.0)
pH, Ven: 7.401 (ref 7.250–7.430)
pO2, Ven: 34 mmHg (ref 32.0–45.0)

## 2018-06-13 LAB — POCT I-STAT 7, (LYTES, BLD GAS, ICA,H+H)
Acid-base deficit: 2 mmol/L (ref 0.0–2.0)
Bicarbonate: 22 mmol/L (ref 20.0–28.0)
Calcium, Ion: 1.11 mmol/L — ABNORMAL LOW (ref 1.15–1.40)
HCT: 42 % (ref 39.0–52.0)
Hemoglobin: 14.3 g/dL (ref 13.0–17.0)
O2 Saturation: 97 %
Potassium: 3.6 mmol/L (ref 3.5–5.1)
Sodium: 137 mmol/L (ref 135–145)
TCO2: 23 mmol/L (ref 22–32)
pCO2 arterial: 34.6 mmHg (ref 32.0–48.0)
pH, Arterial: 7.412 (ref 7.350–7.450)
pO2, Arterial: 85 mmHg (ref 83.0–108.0)

## 2018-06-13 LAB — HEPARIN LEVEL (UNFRACTIONATED): Heparin Unfractionated: 0.49 IU/mL (ref 0.30–0.70)

## 2018-06-13 SURGERY — RIGHT/LEFT HEART CATH AND CORONARY ANGIOGRAPHY
Anesthesia: LOCAL

## 2018-06-13 MED ORDER — SACUBITRIL-VALSARTAN 24-26 MG PO TABS
1.0000 | ORAL_TABLET | Freq: Two times a day (BID) | ORAL | Status: DC
  Administered 2018-06-13 – 2018-06-16 (×7): 1 via ORAL
  Filled 2018-06-13 (×7): qty 1

## 2018-06-13 MED ORDER — HEPARIN (PORCINE) IN NACL 1000-0.9 UT/500ML-% IV SOLN
INTRAVENOUS | Status: DC | PRN
  Administered 2018-06-13: 500 mL

## 2018-06-13 MED ORDER — HEPARIN (PORCINE) IN NACL 1000-0.9 UT/500ML-% IV SOLN
INTRAVENOUS | Status: AC
  Filled 2018-06-13: qty 500

## 2018-06-13 MED ORDER — MIDAZOLAM HCL 2 MG/2ML IJ SOLN
INTRAMUSCULAR | Status: DC | PRN
  Administered 2018-06-13: 1 mg via INTRAVENOUS

## 2018-06-13 MED ORDER — SODIUM CHLORIDE 0.9% FLUSH
3.0000 mL | Freq: Two times a day (BID) | INTRAVENOUS | Status: DC
  Administered 2018-06-13 – 2018-06-16 (×5): 3 mL via INTRAVENOUS

## 2018-06-13 MED ORDER — FENTANYL CITRATE (PF) 100 MCG/2ML IJ SOLN
INTRAMUSCULAR | Status: AC
  Filled 2018-06-13: qty 2

## 2018-06-13 MED ORDER — POTASSIUM CHLORIDE CRYS ER 20 MEQ PO TBCR
40.0000 meq | EXTENDED_RELEASE_TABLET | Freq: Once | ORAL | Status: AC
  Administered 2018-06-13: 40 meq via ORAL
  Filled 2018-06-13: qty 2

## 2018-06-13 MED ORDER — SODIUM CHLORIDE 0.9 % IV SOLN: 250.0000 mL | INTRAVENOUS | Status: DC | PRN

## 2018-06-13 MED ORDER — FENTANYL CITRATE (PF) 100 MCG/2ML IJ SOLN
INTRAMUSCULAR | Status: DC | PRN
  Administered 2018-06-13: 25 ug via INTRAVENOUS

## 2018-06-13 MED ORDER — MIDAZOLAM HCL 2 MG/2ML IJ SOLN
INTRAMUSCULAR | Status: AC
  Filled 2018-06-13: qty 2

## 2018-06-13 MED ORDER — CARVEDILOL 3.125 MG PO TABS
1.5620 mg | ORAL_TABLET | Freq: Two times a day (BID) | ORAL | Status: DC
  Administered 2018-06-13: 1.562 mg via ORAL
  Filled 2018-06-13: qty 1

## 2018-06-13 MED ORDER — FUROSEMIDE 10 MG/ML IJ SOLN
40.0000 mg | Freq: Once | INTRAMUSCULAR | Status: AC
  Administered 2018-06-13: 40 mg via INTRAVENOUS
  Filled 2018-06-13: qty 4

## 2018-06-13 MED ORDER — HEPARIN SODIUM (PORCINE) 1000 UNIT/ML IJ SOLN
INTRAMUSCULAR | Status: DC | PRN
  Administered 2018-06-13: 4500 [IU] via INTRAVENOUS

## 2018-06-13 MED ORDER — HEPARIN (PORCINE) IN NACL 1000-0.9 UT/500ML-% IV SOLN
INTRAVENOUS | Status: AC
  Filled 2018-06-13: qty 1000

## 2018-06-13 MED ORDER — VERAPAMIL HCL 2.5 MG/ML IV SOLN
INTRAVENOUS | Status: DC | PRN
  Administered 2018-06-13: 10 mL via INTRA_ARTERIAL

## 2018-06-13 MED ORDER — LIDOCAINE HCL (PF) 1 % IJ SOLN
INTRAMUSCULAR | Status: AC
  Filled 2018-06-13: qty 30

## 2018-06-13 MED ORDER — ENOXAPARIN SODIUM 40 MG/0.4ML ~~LOC~~ SOLN
40.0000 mg | SUBCUTANEOUS | Status: DC
  Administered 2018-06-14 – 2018-06-16 (×3): 40 mg via SUBCUTANEOUS
  Filled 2018-06-13 (×3): qty 0.4

## 2018-06-13 MED ORDER — VERAPAMIL HCL 2.5 MG/ML IV SOLN
INTRAVENOUS | Status: AC
  Filled 2018-06-13: qty 2

## 2018-06-13 MED ORDER — LIDOCAINE HCL (PF) 1 % IJ SOLN
INTRAMUSCULAR | Status: DC | PRN
  Administered 2018-06-13: 2 mL
  Administered 2018-06-13: 1 mL

## 2018-06-13 MED ORDER — IOHEXOL 350 MG/ML SOLN
INTRAVENOUS | Status: DC | PRN
  Administered 2018-06-13: 70 mL via INTRA_ARTERIAL

## 2018-06-13 MED ORDER — SODIUM CHLORIDE 0.9 % WEIGHT BASED INFUSION
1.0000 mL/kg/h | INTRAVENOUS | Status: AC
  Administered 2018-06-13: 1 mL/kg/h via INTRAVENOUS

## 2018-06-13 MED ORDER — MAGNESIUM SULFATE 2 GM/50ML IV SOLN
2.0000 g | Freq: Once | INTRAVENOUS | Status: AC
  Administered 2018-06-13: 2 g via INTRAVENOUS
  Filled 2018-06-13: qty 50

## 2018-06-13 MED ORDER — SODIUM CHLORIDE 0.9% FLUSH: 3.0000 mL | INTRAVENOUS | Status: DC | PRN

## 2018-06-13 SURGICAL SUPPLY — 15 items
CATH 5FR JL3.5 JR4 ANG PIG MP (CATHETERS) ×1 IMPLANT
CATH BALLN WEDGE 5F 110CM (CATHETERS) ×1 IMPLANT
CATH LAUNCHER 5F RADL (CATHETERS) IMPLANT
CATHETER LAUNCHER 5F RADL (CATHETERS) ×2
DEVICE RAD COMP TR BAND LRG (VASCULAR PRODUCTS) ×1 IMPLANT
GLIDESHEATH SLEND SS 6F .021 (SHEATH) ×1 IMPLANT
GUIDEWIRE INQWIRE 1.5J.035X260 (WIRE) IMPLANT
INQWIRE 1.5J .035X260CM (WIRE) ×2
KIT HEART LEFT (KITS) ×2 IMPLANT
PACK CARDIAC CATHETERIZATION (CUSTOM PROCEDURE TRAY) ×2 IMPLANT
SHEATH GLIDE SLENDER 4/5FR (SHEATH) ×1 IMPLANT
SHEATH PROBE COVER 6X72 (BAG) ×1 IMPLANT
TRANSDUCER W/STOPCOCK (MISCELLANEOUS) ×2 IMPLANT
TUBING CIL FLEX 10 FLL-RA (TUBING) ×2 IMPLANT
WIRE EMERALD 3MM-J .025X260CM (WIRE) ×1 IMPLANT

## 2018-06-13 NOTE — Progress Notes (Addendum)
Progress Note  Patient Name: Kerry Hamilton Date of Encounter: 06/13/2018  Primary Cardiologist: Chrystie Nose, MD   Subjective   Breathing better.  No chest pain.  Angiography this morning demonstrated widely patent coronaries.  Inpatient Medications    Scheduled Meds: . aspirin EC  81 mg Oral Daily  . atorvastatin  40 mg Oral q1800  . [START ON 06/14/2018] enoxaparin (LOVENOX) injection  40 mg Subcutaneous Q24H  . insulin aspart  0-9 Units Subcutaneous Q4H  . nicotine  21 mg Transdermal Daily  . senna-docusate  1 tablet Oral QHS  . sodium chloride flush  3 mL Intravenous Q12H   Continuous Infusions: . sodium chloride    . sodium chloride 1 mL/kg/hr (06/13/18 0931)   PRN Meds: sodium chloride, acetaminophen, alum & mag hydroxide-simeth, nitroGLYCERIN, sodium chloride flush   Vital Signs    Vitals:   06/13/18 0821 06/13/18 0826 06/13/18 0831 06/13/18 0858  BP: 103/73 104/68 106/75 103/79  Pulse: 78 75 76 75  Resp: (!) 30 (!) 25 20 (!) 26  Temp:      TempSrc:      SpO2: 98% 98% 98% 94%  Weight:      Height:        Intake/Output Summary (Last 24 hours) at 06/13/2018 1027 Last data filed at 06/13/2018 0452 Gross per 24 hour  Intake 930 ml  Output 3850 ml  Net -2920 ml   Last 3 Weights 06/12/2018 06/11/2018 06/10/2018  Weight (lbs) 185 lb 6.4 oz 202 lb 6.1 oz 198 lb 10.2 oz  Weight (kg) 84.097 kg 91.8 kg 90.1 kg      Telemetry    Sinus rhythm- Personally Reviewed  ECG    Not repeated- Personally Reviewed  Physical Exam  African-American male lying relatively flat with mild obesity. GEN: No acute distress.   Neck: mod JVD Cardiac: RRR, 2/6 systolic murmur at apex compatible with MR, positive S3 gallop. Respiratory: Clear to auscultation bilaterally. GI: Soft, nontender, non-distended  MS: No edema; No deformity. Neuro:  Nonfocal  Psych: Normal affect   Labs    Chemistry Recent Labs  Lab 06/10/18 1649 06/11/18 0854 06/12/18 0552 06/13/18 0426   NA 139 138 136 134*  K 4.0 4.5 3.6 3.6  CL 110 107 104 102  CO2 20* 16* 22 22  GLUCOSE 171* 124* 175* 78  BUN 25* 22* 22* 18  CREATININE 1.33* 1.19 1.45* 1.20  CALCIUM 8.7* 8.7* 8.2* 8.6*  PROT 6.2* 5.8* 5.2*  --   ALBUMIN 3.5 3.3* 3.0*  --   AST 65* 114* 78*  --   ALT 87* 139* 135*  --   ALKPHOS 186* 169* 145*  --   BILITOT 0.7 1.5* 0.8  --   GFRNONAA >60 >60 55* >60  GFRAA >60 >60 >60 >60  ANIONGAP 9 15 10 10      Hematology Recent Labs  Lab 06/11/18 0854 06/12/18 0552 06/13/18 0426  WBC 5.1 5.3 5.0  RBC 4.84 4.90 4.97  HGB 13.3 13.4 13.9  HCT 40.1 40.7 40.9  MCV 82.9 83.1 82.3  MCH 27.5 27.3 28.0  MCHC 33.2 32.9 34.0  RDW 13.7 13.7 13.7  PLT 232 241 234    Cardiac Enzymes Recent Labs  Lab 06/10/18 1649 06/10/18 2054  TROPONINI 0.51* 0.41*   No results for input(s): TROPIPOC in the last 168 hours.   BNP Recent Labs  Lab 06/10/18 2052  BNP 1,634.1*     DDimer  Recent Labs  Lab 06/10/18  1649  DDIMER 0.81*     Radiology    No results found.  Cardiac Studies    EF 15% by echo with severe dilatation of LV.  Pulmonary capillary wedge pressure mean, 20 mmHg  Widely patent coronaries.  Patient Profile     53 y.o. male with PMH of diet controlled diabetes, and schizophrenia who presented with dilated cardiomyopathy and acute on chronic systolic heart failure  Assessment & Plan    1. Acute on chronic systolic heart failure: Etiology is uncertain but potentially secondary to hypertension, diabetes mellitus II and substance use (alcohol).  Needs guideline directed therapy for LV systolic dysfunction: Beta-blocker as tolerated by blood pressure (carvedilol); Entresto (as tolerated by blood pressure); and diuretic therapy as needed to maintain euvolemia. BMET in AM. Hold diuresis today to allow these agents. IV lasix if dyspnea. 2. Diabetes mellitus type 2: Consider SGLT2 therapy if possible given social and economic circumstances.   Watch BP and  Kidney function.     For questions or updates, please contact [facility] Please consult [email or web address] for contact info under        Signed, Lesleigh Noe, MD  06/13/2018, 10:27 AM

## 2018-06-13 NOTE — TOC Benefit Eligibility Note (Signed)
Transition of Care [facility]) Benefit Eligibility Note    Patient Details  Name: Izrael Dragos MRN: [id number] Date of Birth: 1965-05-09   Medication/Dose: Sherryll Burger                       Additional Notes: UNABLE TO CALL MEDICAID OF [place]  EFF-DATE:  05-26-2018, CO-PAY- $3.90 (RX WILL NEED BE CALLED INTO PHARMACY )    Mardene Sayer Phone Number: 06/13/2018, 12:58 PM

## 2018-06-13 NOTE — Progress Notes (Signed)
Patient complains of chest pain below right breast "just like before I got water around my heart." Breath sounds fine rales right lower lobe and Heart sounds with noted murmur and gallop. Checked Dr. Michaelle Copas notes and Heart sounds documented this morning with murmur and gallop. Lungs were clear. Notified Laverda Page PA and patient given 40 IV lasix as ordered. 1800 Patient had good urine output and diuresed well. No complaints at this time.

## 2018-06-13 NOTE — Progress Notes (Signed)
PROGRESS NOTE  Kerry Hamilton PYP:[id number] DOB: 1965-06-04 DOA: 06/10/2018 PCP: Default, Provider, MD   LOS: 3 days   Brief narrative: Kerry Hamilton a 53 y.o.malewith medical history significant ofDM2, diet controlled, depression, schizophrenia and a pack per day smoker. He presented to the ED on 5/16 with complaint of progressively worsening shortness of breath on minimal exertion, orthopnea and abdominal distention In the ED, he was noted to be fluid overloaded, proBNP was elevated.  Chest x-ray showed cardiomegaly.  He was admitted for acute CHF exacerbation.  Cardiology consult was obtained.  Subjective: Patient was seen and examined this morning.  [name].  Lying down in bed.  Not in distress.  Underwent RHC/LHC this morning.  No new symptoms overnight.  Assessment/Plan:  Principal Problem:   Acute systolic CHF (congestive heart failure) (HCC) Active Problems:   ACS (acute coronary syndrome) (HCC)   Diabetes mellitus type 2, noninsulin dependent (HCC)   Depression   Elevated troponin   Shortness of breath   LFT elevation   Prolonged QT interval   Polysubstance abuse (HCC)  Acute exacerbation of chronic systolic congestive heart failure - Presented with heart failure symptoms.  Started on IV Lasix.  Was held later because of uptrending creatinine.  - TTE showed an EF of 15%.  - Patient underwent RHC and LHC this morning.  No significant coronary artery disease identified. - Heart failure likely secondary to hypertension, diabetes mellitus alcohol use as well as cocaine use. - Per cardiology note, patient needs guideline directed therapy for LV systolic dysfunction.he has been started on Coreg and Entresto today.  Lasix remains on hold to allow these agents.   -Creatinine normal at baseline, peaked at 1.45, down to 1.2 today.   - Repeat BMP in the morning.  IV Lasix as needed for dyspnea.   -1200 cc fluid restriction.  Low-sodium diet.   Elevated troponin -Mild elevated to 0.51.  Initially suspected of NSTEMI, started on heparin drip.  LHC this morning shows no significant coronary artery disease.  Demand ischemia secondary to heart failure suspected.  Heparin drip has been stopped.  Patient was also started on aspirin and statin.  Continue aspirin.  With worsening liver function, I decided to stop statin today.  QTC prolongation -EKG shows QTC progression, 554 ms on 5/18.  Prior to admission, patient was on Seroquel and Risperdal which is currently on hold.   - potassium level 3.6 today, magnesium level 1.9 yesterday.  Replace with oral 40 mEq of potassium and magnesium 2 g IV.  Elevated liver enzymes -Likely from congestion even though low EF however right ventricle pressures are not elevated on RHC. -Repeat liver enzymes tomorrow.  Statin stopped today.  History of diabetes mellitus - Not on meds at home.  Hemoglobin A1c 6.3 on 5/18.  - Blood glucose in normal range throughout except for an elevated level improved to 232 this morning.  Continue to monitor.  Sliding scale insulin with Accu-Cheks ordered.  Not on any oral agents at this time.  Polysubstance abuse -Admits to using cocaine, alcohol, cigarette.  Counseled to quit.  Mobility: Encourage ambulation Diet: Low-sodium diet, 200 cc fluid restriction DVT prophylaxis:  Lovenox 40 mg subcu daily Code Status:   Code Status: Full Code  Family Communication:  Disposition Plan:  Continue medical management.  Lives at home with his sister.  Ultimate plan to home.  Consultants:  Cardiology  Procedures:  RHC/LHC -5/19  Antimicrobials:  Anti-infectives (From admission, onward)   None  Infusions:  . sodium chloride    . sodium chloride 1 mL/kg/hr (06/13/18 0931)    Scheduled Meds: . aspirin EC  81 mg Oral Daily  . atorvastatin  40 mg Oral q1800  . carvedilol  1.562 mg Oral BID WC  . [START ON 06/14/2018] enoxaparin (LOVENOX) injection  40 mg  Subcutaneous Q24H  . insulin aspart  0-9 Units Subcutaneous Q4H  . nicotine  21 mg Transdermal Daily  . sacubitril-valsartan  1 tablet Oral BID  . senna-docusate  1 tablet Oral QHS  . sodium chloride flush  3 mL Intravenous Q12H    PRN meds: sodium chloride, acetaminophen, alum & mag hydroxide-simeth, nitroGLYCERIN, sodium chloride flush   Objective: Vitals:   06/13/18 0831 06/13/18 0858  BP: 106/75 103/79  Pulse: 76 75  Resp: 20 (!) 26  Temp:    SpO2: 98% 94%    Intake/Output Summary (Last 24 hours) at 06/13/2018 1130 Last data filed at 06/13/2018 0452 Gross per 24 hour  Intake 930 ml  Output 3450 ml  Net -2520 ml   Filed Weights   06/10/18 2300 06/11/18 0509 06/12/18 0452  Weight: 90.1 kg 91.8 kg 84.1 kg   Weight change:  Body mass index is 25.14 kg/m.   Physical Exam: General exam: Appears calm and comfortable.  Not short of breath at rest not on oxygen supplementation Skin: No rashes, lesions or ulcers. HEENT: Atraumatic, normocephalic, supple neck, no obvious bleeding Lungs: Clear to auscultation bilaterally CVS: Regular rate and rhythm, no murmur GI/Abd soft, nondistended, nontender, bowel sound present CNS: Alert, awake, oriented x3 Psychiatry: Mood & affect appropriate.  Extremities: Pedal edema improving, no calf tenderness  Data Review: I have personally reviewed the laboratory data and studies available.  Recent Labs  Lab 06/10/18 1649 06/11/18 0854 06/12/18 0552 06/13/18 0426  WBC 5.0 5.1 5.3 5.0  NEUTROABS 2.2  --   --   --   HGB 13.4 13.3 13.4 13.9  HCT 41.1 40.1 40.7 40.9  MCV 86.2 82.9 83.1 82.3  PLT 238 232 241 234   Recent Labs  Lab 06/10/18 1649 06/11/18 0854 06/12/18 0552 06/13/18 0426  NA 139 138 136 134*  K 4.0 4.5 3.6 3.6  CL 110 107 104 102  CO2 20* 16* 22 22  GLUCOSE 171* 124* 175* 78  BUN 25* 22* 22* 18  CREATININE 1.33* 1.19 1.45* 1.20  CALCIUM 8.7* 8.7* 8.2* 8.6*  MG  --   --  1.9  --     Recent Labs  Lab  06/10/18 1649 06/11/18 0854 06/12/18 0552  AST 65* 114* 78*  ALT 87* 139* 135*  ALKPHOS 186* 169* 145*  BILITOT 0.7 1.5* 0.8  PROT 6.2* 5.8* 5.2*  ALBUMIN 3.5 3.3* 3.0*    Lorin Glass, MD  [facility] 06/13/2018

## 2018-06-13 NOTE — Interval H&P Note (Signed)
History and Physical Interval Note:  06/13/2018 7:24 AM  Kerry Hamilton  has presented today for surgery, with the diagnosis of heart failure.  The various methods of treatment have been discussed with the patient and family. After consideration of risks, benefits and other options for treatment, the patient has consented to  Procedure(s): RIGHT/LEFT HEART CATH AND CORONARY ANGIOGRAPHY (N/A) as a surgical intervention.  The patient's history has been reviewed, patient examined, no change in status, stable for surgery.  I have reviewed the patient's chart and labs.  Questions were answered to the patient's satisfaction.   Cath Lab Visit (complete for each Cath Lab visit)  Clinical Evaluation Leading to the Procedure:   ACS: No.  Non-ACS:    Anginal Classification: CCS II  Anti-ischemic medical therapy: Minimal Therapy (1 class of medications)  Non-Invasive Test Results: High-risk stress test findings: cardiac mortality >3%/year  Prior CABG: No previous CABG        Theron Arista [facility] 06/13/2018 7:24 AM

## 2018-06-13 NOTE — Progress Notes (Signed)
Kerry Hamilton is on Medicaid preferred drug list Jan. 2020- copay $3.90

## 2018-06-13 NOTE — Progress Notes (Signed)
    Called by RN, patient complaining of chest pressure, feeling anxious. Symptoms similar to what he experienced prior to cath. Abnormal breath sounds per RN. EF of 15% and lasix has been held in the setting of planned cath with addition of Entresto now. Received and now completed post cath hydration. Will give one time dose of IV lasix 40mg  x1 and monitor BP response. Continue with I&Os. RN to call with further concerns.  -- BMET in am  Signed, Laverda Page, NP-C 06/13/2018, 4:31 PM Pager: [phone]

## 2018-06-13 NOTE — Plan of Care (Signed)
  Problem: Education: Goal: Understanding of CV disease, CV risk reduction, and recovery process will improve Outcome: Progressing   Problem: Activity: Goal: Ability to return to baseline activity level will improve Outcome: Progressing   Problem: Cardiovascular: Goal: Ability to achieve and maintain adequate cardiovascular perfusion will improve Outcome: Progressing   Problem: Health Behavior/Discharge Planning: Goal: Ability to safely manage health-related needs after discharge will improve Outcome: Progressing Pt educated on importance of compliance with prescribed treatment regimen including medication adherence, fluid restrictions, and dietary recommendations. Pt verbalizes understanding of education.   Problem: Education: Goal: Knowledge of General Education information will improve Description Including pain rating scale, medication(s)/side effects and non-pharmacologic comfort measures Outcome: Progressing   Problem: Health Behavior/Discharge Planning: Goal: Ability to manage health-related needs will improve Outcome: Progressing   Problem: Coping: Goal: Level of anxiety will decrease Outcome: Progressing   Problem: Pain Managment: Goal: General experience of comfort will improve Outcome: Progressing   Problem: Cardiovascular: Goal: Vascular access site(s) Level 0-1 will be maintained Outcome: Completed/Met

## 2018-06-14 LAB — COMPREHENSIVE METABOLIC PANEL
ALT: 99 U/L — ABNORMAL HIGH (ref 0–44)
AST: 36 U/L (ref 15–41)
Albumin: 3.1 g/dL — ABNORMAL LOW (ref 3.5–5.0)
Alkaline Phosphatase: 148 U/L — ABNORMAL HIGH (ref 38–126)
Anion gap: 11 (ref 5–15)
BUN: 18 mg/dL (ref 6–20)
CO2: 20 mmol/L — ABNORMAL LOW (ref 22–32)
Calcium: 8.7 mg/dL — ABNORMAL LOW (ref 8.9–10.3)
Chloride: 104 mmol/L (ref 98–111)
Creatinine, Ser: 1.24 mg/dL (ref 0.61–1.24)
GFR calc Af Amer: >60 mL/min (ref >60)
GFR calc non Af Amer: >60 mL/min (ref >60)
Glucose, Bld: 132 mg/dL — ABNORMAL HIGH (ref 70–99)
Potassium: 4.6 mmol/L (ref 3.5–5.1)
Sodium: 135 mmol/L (ref 135–145)
Total Bilirubin: 0.6 mg/dL (ref 0.3–1.2)
Total Protein: 5.9 g/dL — ABNORMAL LOW (ref 6.5–8.1)

## 2018-06-14 LAB — MAGNESIUM: Magnesium: 2 mg/dL (ref 1.7–2.4)

## 2018-06-14 LAB — GLUCOSE, CAPILLARY
Glucose-Capillary: 216 mg/dL — ABNORMAL HIGH (ref 70–99)
Glucose-Capillary: 142 mg/dL — ABNORMAL HIGH (ref 70–99)
Glucose-Capillary: 200 mg/dL — ABNORMAL HIGH (ref 70–99)
Glucose-Capillary: 139 mg/dL — ABNORMAL HIGH (ref 70–99)

## 2018-06-14 LAB — LIPASE, BLOOD: Lipase: 40 U/L (ref 11–51)

## 2018-06-14 MED ORDER — PANTOPRAZOLE SODIUM 40 MG PO TBEC
40.0000 mg | DELAYED_RELEASE_TABLET | Freq: Every day | ORAL | Status: DC
  Administered 2018-06-14 – 2018-06-16 (×3): 40 mg via ORAL
  Filled 2018-06-14 (×3): qty 1

## 2018-06-14 MED ORDER — POLYETHYLENE GLYCOL 3350 17 G PO PACK
17.0000 g | PACK | Freq: Every day | ORAL | Status: DC
  Administered 2018-06-14 – 2018-06-16 (×3): 17 g via ORAL
  Filled 2018-06-14 (×3): qty 1

## 2018-06-14 MED ORDER — CARVEDILOL 3.125 MG PO TABS
3.1250 mg | ORAL_TABLET | Freq: Two times a day (BID) | ORAL | Status: DC
  Administered 2018-06-14 – 2018-06-16 (×5): 3.125 mg via ORAL
  Filled 2018-06-14 (×5): qty 1

## 2018-06-14 NOTE — Progress Notes (Signed)
PROGRESS NOTE  Kerry Hamilton RCB:[id number] DOB: 10-04-1965 DOA: 06/10/2018 PCP: Default, Provider, MD   LOS: 4 days   Brief narrative: Kerry Hamilton a 52 y.o.malewith medical history significant ofDM2, diet controlled, depression, schizophrenia and a pack per day smoker. He presented to the ED on 5/16 with complaint of progressively worsening shortness of breath on minimal exertion, orthopnea and abdominal distention In the ED, he was noted to be fluid overloaded, proBNP was elevated. Chest x-ray showed cardiomegaly.  He was admitted for acute CHF exacerbation.  Cardiology consult was obtained.  Subjective: Patient was seen and examined this morning. States ' I have this pain in my belly for 16 hours. They say have a lot of fluid in my body and I can hear gurgling in my stomach.'  Assessment/Plan:  Principal Problem:   Acute systolic CHF (congestive heart failure) (HCC) Active Problems:   ACS (acute coronary syndrome) (HCC)   Diabetes mellitus type 2, noninsulin dependent (HCC)   Depression   Elevated troponin   Shortness of breath   LFT elevation   Prolonged QT interval   Polysubstance abuse (HCC)  Acute exacerbation of chronic systolic congestive heart failure - Presented with heart failure symptoms.  Started on IV Lasix.  Was held later because of uptrending creatinine.  - TTE showed an EF of 15%.  - Patient underwent RHC and LHC on 5/19.  No significant coronary artery disease identified. - Heart failure likely secondary to hypertension, diabetes mellitus alcohol use as well as cocaine use. - Per cardiology note, patient needs guideline directed therapy for LV systolic dysfunction. - He was been started on Coreg and Entresto, dose being titrated based on blood pressure and creatinine. - Currently scheduled Lasix is on hold.  PRN Lasix administered yesterday for an episode of chest pressure.   - Creatinine normal at baseline, peaked at 1.45, down to 1.24 today.   - Repeat  BMP in the morning.  IV Lasix as needed for dyspnea.   -1200 cc fluid restriction.  Low-sodium diet.  Elevated troponin -Mild elevated to 0.51.  Initially suspected of NSTEMI, started on heparin drip.  LHC this morning shows no significant coronary artery disease.  Demand ischemia secondary to heart failure suspected.  Continue aspirin.  With worsening liver function, statin was stopped.  Abdominal pain -Complains of several lower abdominal pain last night.  On examination, has mild tenderness in epigastrium and periumbilical area. -He had similar pain prior to presentation.  Ultrasound right upper quadrant was obtained on admission which ruled out cholecystitis. -Added lipase level to morning lab today.  Also started on Protonix and Maalox.  QTC prolongation -EKG shows QTC progression, 554 ms on 5/18.  Prior to admission, patient was on Seroquel and Risperdal which is currently on hold.   -Given potassium and magnesium supplement yesterday.  Normal levels today.  Continues to have prolonged QTC.  Elevated liver enzymes -Likely from congestion even though low EF however right ventricle pressures are not elevated on RHC. -Repeat liver enzymes today showed improvement.  Statin stopped.  History of diabetes mellitus - Not on meds at home.  Hemoglobin A1c 6.3 on 5/18.  - Blood glucose in normal range throughout except for elevated fasting blood sugar in the morning over 200. Continue to monitor.  Sliding scale insulin with Accu-Cheks ordered.  Not on any oral agents at this time.  Polysubstance abuse - Admits to using cocaine, alcohol, cigarette.  Counseled to quit.  Mobility: Encourage ambulation Diet: Low-sodium diet, 1200 cc fluid  restriction DVT prophylaxis: Lovenox 40 mg subcu daily Code Status:  Code Status: Full Code  Family Communication: Disposition Plan: Continue medical management.  Lives at home with his sister.  Ultimate plan to home.  Consultants:  Cardiology   Procedures:  RHC/LHC -5/19  Antimicrobials:  Anti-infectives (From admission, onward)   None      Infusions:  . sodium chloride      Scheduled Meds: . aspirin EC  81 mg Oral Daily  . carvedilol  3.125 mg Oral BID WC  . enoxaparin (LOVENOX) injection  40 mg Subcutaneous Q24H  . insulin aspart  0-9 Units Subcutaneous Q4H  . nicotine  21 mg Transdermal Daily  . pantoprazole  40 mg Oral Daily  . polyethylene glycol  17 g Oral Daily  . sacubitril-valsartan  1 tablet Oral BID  . senna-docusate  1 tablet Oral QHS  . sodium chloride flush  3 mL Intravenous Q12H    PRN meds: sodium chloride, acetaminophen, alum & mag hydroxide-simeth, nitroGLYCERIN, sodium chloride flush   Objective: Vitals:   06/13/18 1912 06/14/18 0508  BP: 103/75 94/72  Pulse: 82 81  Resp: (!) 30 11  Temp:  97.6 F (36.4 C)  SpO2:  99%    Intake/Output Summary (Last 24 hours) at 06/14/2018 1103 Last data filed at 06/14/2018 0900 Gross per 24 hour  Intake 1200 ml  Output 2600 ml  Net -1400 ml   Filed Weights   06/10/18 2300 06/11/18 0509 06/12/18 0452  Weight: 90.1 kg 91.8 kg 84.1 kg   Weight change:  Body mass index is 25.14 kg/m.   Physical Exam: General exam: Lying down in bed.  In distress present abdominal pain Skin: No rashes, lesions or ulcers. HEENT: Atraumatic, normocephalic, supple neck, no obvious bleeding Lungs: Clear to auscultation bilaterally CVS: Regular rate and rhythm, no murmur GI/Abd soft, mild tenderness in the epigastrium and periumbilical area CNS: Alert, awake, oriented x3 Psychiatry: Depressed look Extremities: No pedal edema, no calf tenderness  Data Review: I have personally reviewed the laboratory data and studies available.  Recent Labs  Lab 06/10/18 1649 06/11/18 0854 06/12/18 0552 06/13/18 0426 06/13/18 0806 06/13/18 0816  WBC 5.0 5.1 5.3 5.0  --   --   NEUTROABS 2.2  --   --   --   --   --   HGB 13.4 13.3 13.4 13.9 14.3 14.3  HCT 41.1 40.1 40.7  40.9 42.0 42.0  MCV 86.2 82.9 83.1 82.3  --   --   PLT 238 232 241 234  --   --    Recent Labs  Lab 06/10/18 1649 06/11/18 0854 06/12/18 0552 06/13/18 0426 06/13/18 0806 06/13/18 0816 06/14/18 0440  NA 139 138 136 134* 137 137 135  K 4.0 4.5 3.6 3.6 3.6 3.8 4.6  CL 110 107 104 102  --   --  104  CO2 20* 16* 22 22  --   --  20*  GLUCOSE 171* 124* 175* 78  --   --  132*  BUN 25* 22* 22* 18  --   --  18  CREATININE 1.33* 1.19 1.45* 1.20  --   --  1.24  CALCIUM 8.7* 8.7* 8.2* 8.6*  --   --  8.7*  MG  --   --  1.9  --   --   --  2.0   Recent Labs  Lab 06/10/18 1649 06/11/18 0854 06/12/18 0552 06/14/18 0440  AST 65* 114* 78* 36  ALT 87* 139*  135* 99*  ALKPHOS 186* 169* 145* 148*  BILITOT 0.7 1.5* 0.8 0.6  PROT 6.2* 5.8* 5.2* 5.9*  ALBUMIN 3.5 3.3* 3.0* 3.1*   Lab Results  Component Value Date   HGBA1C 6.3 (H) 06/11/2018   Lorin Glass, MD  [facility] 06/14/2018

## 2018-06-14 NOTE — Progress Notes (Signed)
Progress Note  Patient Name: Kerry Hamilton Date of Encounter: 06/14/2018  Primary Cardiologist: Chrystie Nose, MD   Subjective   Had chest pain last night.  Inpatient Medications    Scheduled Meds: . aspirin EC  81 mg Oral Daily  . carvedilol  1.562 mg Oral BID WC  . enoxaparin (LOVENOX) injection  40 mg Subcutaneous Q24H  . insulin aspart  0-9 Units Subcutaneous Q4H  . nicotine  21 mg Transdermal Daily  . sacubitril-valsartan  1 tablet Oral BID  . senna-docusate  1 tablet Oral QHS  . sodium chloride flush  3 mL Intravenous Q12H   Continuous Infusions: . sodium chloride     PRN Meds: sodium chloride, acetaminophen, alum & mag hydroxide-simeth, nitroGLYCERIN, sodium chloride flush   Vital Signs    Vitals:   06/13/18 1542 06/13/18 1543 06/13/18 1912 06/14/18 0508  BP: 96/77 96/77 103/75 94/72  Pulse: 83 80 82 81  Resp: (!) 27 (!) 21 (!) 30 11  Temp: 98.1 F (36.7 C)   97.6 F (36.4 C)  TempSrc: Oral   Oral  SpO2: 96% 100%  99%  Weight:      Height:        Intake/Output Summary (Last 24 hours) at 06/14/2018 0821 Last data filed at 06/14/2018 0000 Gross per 24 hour  Intake 840 ml  Output 2600 ml  Net -1760 ml   Last 3 Weights 06/12/2018 06/11/2018 06/10/2018  Weight (lbs) 185 lb 6.4 oz 202 lb 6.1 oz 198 lb 10.2 oz  Weight (kg) 84.097 kg 91.8 kg 90.1 kg      Telemetry    NSR - Personally Reviewed  ECG    No new data - Personally Reviewed  Physical Exam  Stable appearing. GEN: No acute distress.   Neck: No JVD Cardiac: RRR, no murmurs, rubs, or gallops.  Respiratory: Clear to auscultation bilaterally. GI: Soft, nontender, non-distended  MS: No edema; No deformity. Neuro:  Nonfocal  Psych: Normal affect   Labs    Chemistry Recent Labs  Lab 06/11/18 0854 06/12/18 0552 06/13/18 0426 06/13/18 0806 06/13/18 0816 06/14/18 0440  NA 138 136 134* 137 137 135  K 4.5 3.6 3.6 3.6 3.8 4.6  CL 107 104 102  --   --  104  CO2 16* 22 22  --   --  20*   GLUCOSE 124* 175* 78  --   --  132*  BUN 22* 22* 18  --   --  18  CREATININE 1.19 1.45* 1.20  --   --  1.24  CALCIUM 8.7* 8.2* 8.6*  --   --  8.7*  PROT 5.8* 5.2*  --   --   --  5.9*  ALBUMIN 3.3* 3.0*  --   --   --  3.1*  AST 114* 78*  --   --   --  36  ALT 139* 135*  --   --   --  99*  ALKPHOS 169* 145*  --   --   --  148*  BILITOT 1.5* 0.8  --   --   --  0.6  GFRNONAA >60 55* >60  --   --  >60  GFRAA >60 >60 >60  --   --  >60  ANIONGAP 15 10 10   --   --  11     Hematology Recent Labs  Lab 06/11/18 0854 06/12/18 0552 06/13/18 0426 06/13/18 0806 06/13/18 0816  WBC 5.1 5.3 5.0  --   --  RBC 4.84 4.90 4.97  --   --   HGB 13.3 13.4 13.9 14.3 14.3  HCT 40.1 40.7 40.9 42.0 42.0  MCV 82.9 83.1 82.3  --   --   MCH 27.5 27.3 28.0  --   --   MCHC 33.2 32.9 34.0  --   --   RDW 13.7 13.7 13.7  --   --   PLT 232 241 234  --   --     Cardiac Enzymes Recent Labs  Lab 06/10/18 1649 06/10/18 2054  TROPONINI 0.51* 0.41*   No results for input(s): TROPIPOC in the last 168 hours.   BNP Recent Labs  Lab 06/10/18 2052  BNP 1,634.1*     DDimer  Recent Labs  Lab 06/10/18 1649  DDIMER 0.81*     Radiology    No results found.  Cardiac Studies    Cath: no obstructive disease  Echo: EF 15%  Patient Profile     53 y.o. male with PMH of diet controlled diabetes, and schizophrenia who presented with dilated cardiomyopathy and acute on chronic systolic heart failure  Assessment & Plan    1. A/C systolic heart failure: now euvolemic. Increase carvedilol to 3.125 mg BID. Hold diuretic today. Ambulate. 2. Socioeconomic barriers; care management needed to assist with f/u mechanism; Medication procurement; and possibly connection to AHF clinic. Will discuss today.        For questions or updates, please contact [facility] Please consult [email or web address] for contact info under        Signed, Lesleigh Noe, MD  06/14/2018, 8:21 AM

## 2018-06-15 ENCOUNTER — Telehealth: Payer: Self-pay | Admitting: Cardiology

## 2018-06-15 ENCOUNTER — Other Ambulatory Visit: Payer: Self-pay | Admitting: Cardiology

## 2018-06-15 LAB — COMPREHENSIVE METABOLIC PANEL
ALT: 77 U/L — ABNORMAL HIGH (ref 0–44)
AST: 30 U/L (ref 15–41)
Albumin: 2.9 g/dL — ABNORMAL LOW (ref 3.5–5.0)
Alkaline Phosphatase: 123 U/L (ref 38–126)
Anion gap: 7 (ref 5–15)
BUN: 19 mg/dL (ref 6–20)
CO2: 20 mmol/L — ABNORMAL LOW (ref 22–32)
Calcium: 8.7 mg/dL — ABNORMAL LOW (ref 8.9–10.3)
Chloride: 107 mmol/L (ref 98–111)
Creatinine, Ser: 1.21 mg/dL (ref 0.61–1.24)
GFR calc Af Amer: >60 mL/min (ref >60)
GFR calc non Af Amer: >60 mL/min (ref >60)
Glucose, Bld: 242 mg/dL — ABNORMAL HIGH (ref 70–99)
Potassium: 4.7 mmol/L (ref 3.5–5.1)
Sodium: 134 mmol/L — ABNORMAL LOW (ref 135–145)
Total Bilirubin: 0.4 mg/dL (ref 0.3–1.2)
Total Protein: 5.6 g/dL — ABNORMAL LOW (ref 6.5–8.1)

## 2018-06-15 LAB — GLUCOSE, CAPILLARY
Glucose-Capillary: 99 mg/dL (ref 70–99)
Glucose-Capillary: 201 mg/dL — ABNORMAL HIGH (ref 70–99)
Glucose-Capillary: 252 mg/dL — ABNORMAL HIGH (ref 70–99)
Glucose-Capillary: 118 mg/dL — ABNORMAL HIGH (ref 70–99)

## 2018-06-15 LAB — MAGNESIUM: Magnesium: 2 mg/dL (ref 1.7–2.4)

## 2018-06-15 MED ORDER — FUROSEMIDE 20 MG PO TABS
20.0000 mg | ORAL_TABLET | Freq: Every day | ORAL | Status: DC
  Administered 2018-06-15 – 2018-06-16 (×2): 20 mg via ORAL
  Filled 2018-06-15 (×2): qty 1

## 2018-06-15 MED ORDER — INSULIN ASPART 100 UNIT/ML ~~LOC~~ SOLN
0.0000 [IU] | Freq: Three times a day (TID) | SUBCUTANEOUS | Status: DC
  Administered 2018-06-15: 18:00:00 5 [IU] via SUBCUTANEOUS
  Administered 2018-06-15: 3 [IU] via SUBCUTANEOUS
  Administered 2018-06-16: 13:00:00 2 [IU] via SUBCUTANEOUS
  Administered 2018-06-16: 1 [IU] via SUBCUTANEOUS

## 2018-06-15 NOTE — Progress Notes (Signed)
PROGRESS NOTE  Kerry Hamilton QQP:[id number] DOB: 1965-12-28 DOA: 06/10/2018 PCP: Default, Provider, MD   LOS: 5 days   Brief narrative: Kerry Hamilton a 52 y.o.malewith medical history significant ofDM2, diet controlled, depression,schizophrenia andapack per day smoker. He presented to the ED on 5/16 with complaint of progressively worsening shortness of breath on minimal exertion, orthopnea and abdominal distention In the ED, he was noted to be fluid overloaded, proBNP was elevated. Chest x-ray showed cardiomegaly.  He was admitted for acute CHF exacerbation. Cardiology consult was obtained.  Subjective: Patient was seen and examined this morning.  [name].  Drowsy this morning.  No complaint.  Assessment/Plan:  Principal Problem:   Acute systolic CHF (congestive heart failure) (HCC) Active Problems:   ACS (acute coronary syndrome) (HCC)   Diabetes mellitus type 2, noninsulin dependent (HCC)   Depression   Elevated troponin   Shortness of breath   LFT elevation   Prolonged QT interval   Polysubstance abuse (HCC)  Acute exacerbation of chronic systolic congestive heart failure -Presented with heart failure symptoms. Started on IV Lasix. Was held later because of uptrending creatinine.  -TTE showed an EF of 15%. -Patient underwent RHC and LHC on 5/19. No significant coronary artery disease identified. -Heart failure likely secondary to hypertension, diabetes mellitus alcohol use as well as cocaine use. -Per cardiology note, patient needs guideline directed therapy for LV systolic dysfunction. - He was been started on Coreg and Entresto, dose being titrated based on blood pressure and creatinine. - Currently scheduled Lasix is on hold.   - Creatinine normal at baseline, peaked at 1.45, down to 1.21 today. -Repeat BMP in the morning. PRN IV Lasix as needed for dyspnea.  -1200 cc fluid restriction. Low-sodium diet.  Elevated  troponin - Mild elevated to 0.51. Initially suspected of NSTEMI, started on heparin drip. LHC 5/19 showed no significant coronary artery disease. Demand ischemia secondary to heart failure suspected. Continue aspirin. With worsening liver function, statin was stopped.  Abdominal pain -Complaint of abdominal pain on 5/20.  Lipase level was normal.  Protonix and Maalox was added.  No complaint of abdominal pain at this time. -He had similar pain prior to presentation.  Ultrasound right upper quadrant was obtained on admission which ruled out cholecystitis.  QTC prolongation -EKG shows QTC progression, 554 ms on 5/18. Prior to admission, patient was on Seroquel and Risperdal which is currently on hold.  -Potassium and magnesium level normalized. -Psych meds on hold.  Elevated liver enzymes -Likely from congestion even though low EF however right ventricle pressures are not elevated on RHC. -Repeat liver enzymes today showed improvement.  Statin stopped.  History of diabetes mellitus -Not on meds at home. Hemoglobin A1c 6.3 on 5/18.  - Despite low A1c, blood sugar level has been mostly elevated, sometimes over 200. May need oral agent at discharge.    Polysubstance abuse - Admits to using cocaine, alcohol, cigarette. Counseled to quit.  Mobility:Encourage ambulation Diet:Low-sodium diet, 1200 cc fluid restriction DVT prophylaxis:Lovenox 40 mg subcu daily Code Status:Code Status: Full Code Family Communication: Disposition Plan:Continue medical management. Lives at home with his sister. Ultimate plan to home.  Consultants:  Cardiology  Procedures:  RHC/LHC-5/19  Antimicrobials:  Anti-infectives (From admission, onward)   None      Infusions:  . sodium chloride      Scheduled Meds: . aspirin EC  81 mg Oral Daily  . carvedilol  3.125 mg Oral BID WC  . enoxaparin (LOVENOX) injection  40 mg  Subcutaneous Q24H  . insulin aspart  0-9 Units  Subcutaneous TID AC & HS  . nicotine  21 mg Transdermal Daily  . pantoprazole  40 mg Oral Daily  . polyethylene glycol  17 g Oral Daily  . sacubitril-valsartan  1 tablet Oral BID  . senna-docusate  1 tablet Oral QHS  . sodium chloride flush  3 mL Intravenous Q12H    PRN meds: sodium chloride, acetaminophen, alum & mag hydroxide-simeth, nitroGLYCERIN, sodium chloride flush   Objective: Vitals:   06/15/18 0541 06/15/18 0814  BP: 91/67 95/67  Pulse:  82  Resp:    Temp: 98.2 F (36.8 C)   SpO2:      Intake/Output Summary (Last 24 hours) at 06/15/2018 1203 Last data filed at 06/15/2018 0300 Gross per 24 hour  Intake 1080 ml  Output 2002 ml  Net -922 ml   Filed Weights   06/11/18 0509 06/12/18 0452 06/15/18 0541  Weight: 91.8 kg 84.1 kg 83.4 kg   Weight change:  Body mass index is 24.94 kg/m.   Physical Exam: General exam: Appears calm and comfortable.  Skin: No rashes, lesions or ulcers. HEENT: Atraumatic, normocephalic, supple neck, no obvious bleeding Lungs: Clear to auscultation bilaterally CVS: Regular rate and rhythm, no murmur GI/Abd soft, nontender, nondistended, bowel sound present CNS: Drowsy, opens eyes on verbal command, able to follow motor commands Psychiatry: Appropriate mood Extremities: No pedal edema, no calf tenderness  Data Review: I have personally reviewed the laboratory data and studies available.  Recent Labs  Lab 06/10/18 1649 06/11/18 0854 06/12/18 0552 06/13/18 0426 06/13/18 0806 06/13/18 0816  WBC 5.0 5.1 5.3 5.0  --   --   NEUTROABS 2.2  --   --   --   --   --   HGB 13.4 13.3 13.4 13.9 14.3 14.3  HCT 41.1 40.1 40.7 40.9 42.0 42.0  MCV 86.2 82.9 83.1 82.3  --   --   PLT 238 232 241 234  --   --    Recent Labs  Lab 06/11/18 0854 06/12/18 0552 06/13/18 0426 06/13/18 0806 06/13/18 0816 06/14/18 0440 06/15/18 0315  NA 138 136 134* 137 137 135 134*  K 4.5 3.6 3.6 3.6 3.8 4.6 4.7  CL 107 104 102  --   --  104 107  CO2 16* 22  22  --   --  20* 20*  GLUCOSE 124* 175* 78  --   --  132* 242*  BUN 22* 22* 18  --   --  18 19  CREATININE 1.19 1.45* 1.20  --   --  1.24 1.21  CALCIUM 8.7* 8.2* 8.6*  --   --  8.7* 8.7*  MG  --  1.9  --   --   --  2.0 2.0    Lorin Glass, MD  [facility] 06/15/2018

## 2018-06-15 NOTE — Progress Notes (Signed)
The patient has been seen in conjunction with Laverda PageLindsay Roberts, NP. All aspects of care have been considered and discussed. The patient has been personally interviewed, examined, and all clinical data has been reviewed.   Currently on guideline directed therapy for heart failure.  Blood pressure does not allow further up titration.  I have concerns about follow-up and medication compliance.  He would need to have an outpatient consultation with the advanced heart failure clinic if possible.  Needs cardiology transition of care follow-up in 7 days.  Primary cardiologist, Dr. Rennis GoldenHilty.  A basic metabolic panel needs to be done at that time.  Furosemide 20 mg/day will be used.  This needs to be initiated and we need to be certain blood pressure tolerates this before discharge.  Needs to ambulate.  Needs case management to help with disposition.    Progress Note  Patient Name: Kerry Hamilton Date of Encounter: 06/15/2018  Primary Cardiologist: Chrystie NoseKenneth C Hilty, MD   Subjective   No complaint of dyspnea.  Chest pain off and on, clearly noncardiac.  Inpatient Medications    Scheduled Meds: . aspirin EC  81 mg Oral Daily  . carvedilol  3.125 mg Oral BID WC  . enoxaparin (LOVENOX) injection  40 mg Subcutaneous Q24H  . insulin aspart  0-9 Units Subcutaneous TID AC & HS  . nicotine  21 mg Transdermal Daily  . pantoprazole  40 mg Oral Daily  . polyethylene glycol  17 g Oral Daily  . sacubitril-valsartan  1 tablet Oral BID  . senna-docusate  1 tablet Oral QHS  . sodium chloride flush  3 mL Intravenous Q12H   Continuous Infusions: . sodium chloride     PRN Meds: sodium chloride, acetaminophen, alum & mag hydroxide-simeth, nitroGLYCERIN, sodium chloride flush   Vital Signs    Vitals:   06/14/18 1300 06/14/18 2210 06/15/18 0000 06/15/18 0541  BP: 111/89 107/73  91/67  Pulse: 80 84 75   Resp: (!) 26 (!) 30 20   Temp: 98 F (36.7 C) 97.9 F (36.6 C)  98.2 F (36.8 C)  TempSrc:  Oral Oral  Oral  SpO2: 100% 99% 94%   Weight:    83.4 kg  Height:        Intake/Output Summary (Last 24 hours) at 06/15/2018 0753 Last data filed at 06/15/2018 0300 Gross per 24 hour  Intake 1440 ml  Output 2002 ml  Net -562 ml   Last 3 Weights 06/15/2018 06/12/2018 06/11/2018  Weight (lbs) 183 lb 13.8 oz 185 lb 6.4 oz 202 lb 6.1 oz  Weight (kg) 83.4 kg 84.097 kg 91.8 kg      Telemetry    Sinus rhythm- Personally Reviewed  ECG    Not repeated- Personally Reviewed  Physical Exam  Lying flat GEN: No acute distress.   Neck: No JVD Cardiac: RRR.  Soft S3.Marland Kitchen.  Respiratory: Clear to auscultation bilaterally. GI: Soft, nontender, non-distended  MS: No edema; No deformity. Neuro:  Nonfocal  Psych: Normal affect   Labs    Chemistry Recent Labs  Lab 06/12/18 0552 06/13/18 0426  06/13/18 0816 06/14/18 0440 06/15/18 0315  NA 136 134*   < > 137 135 134*  K 3.6 3.6   < > 3.8 4.6 4.7  CL 104 102  --   --  104 107  CO2 22 22  --   --  20* 20*  GLUCOSE 175* 78  --   --  132* 242*  BUN 22* 18  --   --  18 19  CREATININE 1.45* 1.20  --   --  1.24 1.21  CALCIUM 8.2* 8.6*  --   --  8.7* 8.7*  PROT 5.2*  --   --   --  5.9* 5.6*  ALBUMIN 3.0*  --   --   --  3.1* 2.9*  AST 78*  --   --   --  36 30  ALT 135*  --   --   --  99* 77*  ALKPHOS 145*  --   --   --  148* 123  BILITOT 0.8  --   --   --  0.6 0.4  GFRNONAA 55* >60  --   --  >60 >60  GFRAA >60 >60  --   --  >60 >60  ANIONGAP 10 10  --   --  11 7   < > = values in this interval not displayed.     Hematology Recent Labs  Lab 06/11/18 0854 06/12/18 0552 06/13/18 0426 06/13/18 0806 06/13/18 0816  WBC 5.1 5.3 5.0  --   --   RBC 4.84 4.90 4.97  --   --   HGB 13.3 13.4 13.9 14.3 14.3  HCT 40.1 40.7 40.9 42.0 42.0  MCV 82.9 83.1 82.3  --   --   MCH 27.5 27.3 28.0  --   --   MCHC 33.2 32.9 34.0  --   --   RDW 13.7 13.7 13.7  --   --   PLT 232 241 234  --   --     Cardiac Enzymes Recent Labs  Lab 06/10/18 1649  06/10/18 2054  TROPONINI 0.51* 0.41*   No results for input(s): TROPIPOC in the last 168 hours.   BNP Recent Labs  Lab 06/10/18 2052  BNP 1,634.1*     DDimer  Recent Labs  Lab 06/10/18 1649  DDIMER 0.81*     Radiology    No results found.  Cardiac Studies   Cath: 06/13/18   LV end diastolic pressure is mildly elevated.   1. No significant CAD 2. Mildly elevated LV filling pressures. 3. Normal right heart pressures 4. Cardiac index 2.2.  Plan: medical management.   TTE: 06/11/18  IMPRESSIONS    1. The left ventricle has severely reduced systolic function, with an ejection fraction of 15%. The cavity size was severely dilated. Left ventricular diastolic Doppler parameters are consistent with pseudonormalization.  2. There is spontaneous echo contrast in the LV cavity but no obvious LV thrombus.  3. The right ventricle has moderately reduced systolic function. The cavity was severely enlarged. There is no increase in right ventricular wall thickness. Right ventricular systolic pressure is moderately elevated with an estimated pressure of 56.2  mmHg.  4. Left atrial size was severely dilated.  5. Right atrial size was moderately dilated.  6. Moderate pleural effusion in the left lateral region.  7. The mitral valve is degenerative. Mild thickening of the anterior and posterior mitral valve leaflets. Mitral valve regurgitation is moderate by color flow Doppler. The MR jet is eccentric posteriorly directed.  8. Tricuspid valve regurgitation is moderate.  9. The aortic valve is tricuspid. Mild sclerosis of the aortic valve. 10. The inferior vena cava was dilated in size with <50% respiratory variability.   Patient Profile     53 y.o. male with PMH of diet controlled diabetes, and schizophrenia who presented withdilated cardiomyopathy and acute on chronic systolic heart failure.   Assessment & Plan  1. Acute systolic HF: EF noted at 15% with severely dilated  LV. R/LHC noted above without significant CAD.Tolerating low dose BB and Entresto doses. Net -10L, weight trending down. MD to dose lasix.  2. DM: Hgb 6.3. Consideration of SGLT2 therapy if able to afford.   3. Elevated LFTs: now trending down, suspect elevated in the setting of acute CHF.   4. Polysubstance abuse: counseled on cessation this admission.  For questions or updates, please contact [facility] Please consult [email or web address] for contact info under        Signed, Laverda Page, NP  06/15/2018, 7:53 AM

## 2018-06-15 NOTE — TOC Initial Note (Addendum)
Transition of Care [facility]) - Initial/Assessment Note    Patient Details  Name: Kerry Hamilton MRN: [id number] Date of Birth: Oct 05, 1965  [facility]) CM/SW Contact:    Gala Lewandowsky, RN Phone Number: 06/15/2018, 2:49 PM  Clinical Narrative: CM spoke with patient and he currently lives in a boarding house with other males. Patient states he takes the bus to appointments. Pt has Medicaid- cost of medications will be no more than $3.50. Patient has PCP at [facility]- appointment scheduled and placed on the AVS. CM did reach out to the Heart Failure Navigator and the clinic will follow up with the patient along with Paramedicine. Pt states he is homebound so he qualifies for [facility] RN for medication management- CM was able to set the patient up with [facility]. SOC to begin within 24-48 hours post transition home. CM did speak with patient's sister Gershon Cull @ [phone]- patient does not have a telephone. If patient transitions home on Friday please send Rx's to the TOC-Pharmacy. If the patient transitions home over the weekend medications will need to be sent to [facility]. Pt will need order and F2F for [facility] RN disease and medication management. If patient does not get medications via TOC-pt will need 30 day free Entresto card.  No further needs from CM at this time.                   Expected Discharge Plan: [facility]) Barriers to Discharge: No Barriers Identified   Patient Goals and CMS Choice Patient states their goals for this hospitalization and ongoing recovery are:: (to go back home ) CMS Medicare.gov Compare Post Acute Care list provided to:: Other (Comment Required)(Sister) Choice offered to / list presented to : Patient  Expected Discharge Plan and Services Expected Discharge Plan: [facility]) In-house Referral: NA Discharge Planning Services: CM Consult, HF Clinic(Navigator with [facility] to  see outpatient- Paramedicine to follow. ) Post Acute Care Choice: Home Health Living arrangements for the past 2 months: Boarding House                 DME Arranged: N/A DME Agency: NA       HH Arranged: RN, Disease Management(Medication Management) HH Agency: [facility] Date HH Agency Contacted: 06/15/18 Time HH Agency Contacted: 1447 Representative spoke with at [facility] Agency: Angela Adam  Prior Living Arrangements/Services Living arrangements for the past 2 months: [employer] Lives with:: Roommate Patient language and need for interpreter reviewed:: Yes Do you feel safe going back to the place where you live?: Yes      Need for Family Participation in Patient Care: Yes (Comment)   Current home services: (none) Criminal Activity/Legal Involvement Pertinent to Current Situation/Hospitalization: No - Comment as needed  Activities of Daily Living Home Assistive Devices/Equipment: None ADL Screening (condition at time of admission) Patient's cognitive ability adequate to safely complete daily activities?: Yes Is the patient deaf or have difficulty hearing?: No Does the patient have difficulty seeing, even when wearing glasses/contacts?: No Does the patient have difficulty concentrating, remembering, or making decisions?: No Patient able to express need for assistance with ADLs?: Yes Does the patient have difficulty dressing or bathing?: No Independently performs ADLs?: Yes (appropriate for developmental age) Does the patient have difficulty walking or climbing stairs?: No Weakness of Legs: None Weakness of Arms/Hands: None  Permission Sought/Granted Permission sought to share information with : Family Supports Permission granted to share  information with : Yes, Verbal Permission Granted  Share Information with NAME: Belinda Block- sister [phone]           Emotional Assessment Appearance:: Appears stated age Attitude/Demeanor/Rapport: Engaged Affect  (typically observed): Accepting Orientation: : Oriented to Self, Oriented to Situation, Oriented to Place, Oriented to  Time Alcohol / Substance Use: Tobacco Use Psych Involvement: Outpatient Provider  Admission diagnosis:  Epigastric pain [R10.13] Patient Active Problem List   Diagnosis Date Noted  . Acute systolic CHF (congestive heart failure) (HCC)   . Elevated troponin   . Shortness of breath   . LFT elevation   . Prolonged QT interval   . Polysubstance abuse (HCC)   . ACS (acute coronary syndrome) (HCC) 06/10/2018  . Diabetes mellitus type 2, noninsulin dependent (HCC) 06/10/2018  . Depression 06/10/2018   PCP:  Default, Provider, MD Pharmacy:  No Pharmacies Listed    Social Determinants of Health (SDOH) Interventions    Readmission Risk Interventions No flowsheet data found.

## 2018-06-15 NOTE — Telephone Encounter (Signed)
Staff message sent to [facility] about new patient referral. TOC scheduled in the office with Dr. Blanchie Dessert team.

## 2018-06-16 DIAGNOSIS — F1910 Other psychoactive substance abuse, uncomplicated: Secondary | ICD-10-CM

## 2018-06-16 LAB — COMPREHENSIVE METABOLIC PANEL
ALT: 70 U/L — ABNORMAL HIGH (ref 0–44)
AST: 31 U/L (ref 15–41)
Albumin: 3.1 g/dL — ABNORMAL LOW (ref 3.5–5.0)
Alkaline Phosphatase: 120 U/L (ref 38–126)
Anion gap: 8 (ref 5–15)
BUN: 17 mg/dL (ref 6–20)
CO2: 21 mmol/L — ABNORMAL LOW (ref 22–32)
Calcium: 8.8 mg/dL — ABNORMAL LOW (ref 8.9–10.3)
Chloride: 107 mmol/L (ref 98–111)
Creatinine, Ser: 1.17 mg/dL (ref 0.61–1.24)
GFR calc Af Amer: >60 mL/min (ref >60)
GFR calc non Af Amer: >60 mL/min (ref >60)
Glucose, Bld: 122 mg/dL — ABNORMAL HIGH (ref 70–99)
Potassium: 4.5 mmol/L (ref 3.5–5.1)
Sodium: 136 mmol/L (ref 135–145)
Total Bilirubin: 0.5 mg/dL (ref 0.3–1.2)
Total Protein: 5.7 g/dL — ABNORMAL LOW (ref 6.5–8.1)

## 2018-06-16 LAB — CULTURE, BLOOD (ROUTINE X 2)
Culture: NO GROWTH
Culture: NO GROWTH
Special Requests: ADEQUATE

## 2018-06-16 LAB — GLUCOSE, CAPILLARY
Glucose-Capillary: 123 mg/dL — ABNORMAL HIGH (ref 70–99)
Glucose-Capillary: 153 mg/dL — ABNORMAL HIGH (ref 70–99)

## 2018-06-16 LAB — MAGNESIUM: Magnesium: 1.8 mg/dL (ref 1.7–2.4)

## 2018-06-16 MED ORDER — SACUBITRIL-VALSARTAN 24-26 MG PO TABS: 1.0000 | ORAL_TABLET | Freq: Two times a day (BID) | ORAL | 0 refills | Status: AC

## 2018-06-16 MED ORDER — ASPIRIN 81 MG PO TBEC: 81.0000 mg | DELAYED_RELEASE_TABLET | Freq: Every day | ORAL | 0 refills | Status: AC

## 2018-06-16 MED ORDER — FUROSEMIDE 20 MG PO TABS: 20.0000 mg | ORAL_TABLET | Freq: Every day | ORAL | 0 refills | Status: DC

## 2018-06-16 MED ORDER — CARVEDILOL 3.125 MG PO TABS: 3.1250 mg | ORAL_TABLET | Freq: Two times a day (BID) | ORAL | 2 refills | Status: DC

## 2018-06-16 MED FILL — ENTRESTO 24 MG-26 MG TABLET: 24-26 | 30 days supply | Qty: 60 | Fill #0

## 2018-06-16 MED FILL — ASPIRIN LOW DOSE 81 MG TBEC: 81 | 90 days supply | Qty: 90 | Fill #0

## 2018-06-16 MED FILL — CARVEDILOL 3.125 MG TABLET: 3.125 | 30 days supply | Qty: 60 | Fill #0

## 2018-06-16 MED FILL — FUROSEMIDE 20 MG TAB: 20 | 30 days supply | Qty: 30 | Fill #0

## 2018-06-16 NOTE — Discharge Summary (Signed)
Physician Discharge Summary  Kerry Hamilton BMW:[id number] DOB: December 19, 1965 DOA: 06/10/2018  PCP: Default, Provider, MD  Admit date: 06/10/2018 Discharge date: 06/16/2018  Admitted From: Home Discharge disposition: Home   Code Status: Full Code   Recommendations for Outpatient Follow-Up:   1. Follow-up with cardiology as an outpatient  Discharge Diagnosis:   Principal Problem:   Acute systolic CHF (congestive heart failure) (HCC) Active Problems:   ACS (acute coronary syndrome) (HCC)   Diabetes mellitus type 2, noninsulin dependent (HCC)   Depression   Elevated troponin   Shortness of breath   LFT elevation   Prolonged QT interval   Polysubstance abuse (HCC)    History of Present Illness / Brief narrative:  Kerry Rogersis a 52 y.o.malewith medical history significant ofDM2, diet controlled, depression,schizophrenia andapack per day smoker. He presented to the ED on 5/16 with complaint of progressively worsening shortness of breath on minimal exertion, orthopnea and abdominal distention In the ED, he was noted to be fluid overloaded, proBNP was elevated. Chest x-ray showed cardiomegaly.  He was admitted for acute CHF exacerbation. Cardiology consult was obtained.  Hospital Course:  Acute exacerbation of chronic systolic congestive heart failure -Presented with heart failure symptoms. Started on IV Lasix. Was held later because of uptrending creatinine.  -TTE showed an EF of 15%. -Patient underwent RHC and [facility] 5/19. No significant coronary artery disease identified. -Heart failure likely secondary to hypertension, diabetes mellitus alcohol use as well as cocaine use. -Per cardiology note, patient needs guideline directed therapy for LV systolic dysfunction. -He wasbeen started on Coreg and Entresto,dose was titrated based on blood pressure and creatinine. -Lasix 20 mg daily was started since yesterday.  His blood pressure has been remaining on the lower  side of normal.  He ambulated on the hallway today without assistance and without any dizziness, chest pain or shortness of breath. - Creatinine normal at baseline, peaked at 1.45, down to 1.17 today. - Continue 1200 cc fluid restriction. Low-sodium diet.  Elevated troponin - Mild elevated to 0.51. Initially suspected of NSTEMI, was initially started on heparin drip. LHC 5/19 showed no significant coronary artery disease. Demand ischemia secondary to heart failure suspected. Continue aspirin. With worsening liver function,statin was stopped.  Abdominal pain -Complaint of abdominal pain on 5/20.  Lipase level was normal.  Protonix and Maalox were added.  No complaint of abdominal pain at this time. -He had similar pain prior to presentation. Ultrasound right upper quadrant was obtained on admission which ruled out cholecystitis.  QTC prolongation -EKG shows QTC progression, 554 ms on 5/18. Prior to admission, patient was on Seroquel and Risperdal which were held. -Potassium and magnesium level normalized.  Elevated liver enzymes -Likely from congestion even though low EF however right ventricle pressures are not elevated on RHC. -Repeat liver enzymes improvement. Statin stopped.  History of diabetes mellitus -Not on any meds at home. Hemoglobin A1c 6.3 on 5/18.  - Continue dietary control.  Polysubstance abuse - Admits to using cocaine, alcohol, cigarette. Counseled to quit.  Stable for discharge home today. Subjective:  Patient was seen and examined this afternoon.  Lying down in bed.  Not in distress.  No shortness of breath, chest pain, no dizziness on ambulation on the hallway today.  Feels comfortable to go home today.  Discharge Exam:   Vitals:   06/16/18 1133 06/16/18 1244 06/16/18 1332 06/16/18 1433  BP: 91/69 95/77 112/79 98/64  Pulse:      Resp: (!) 21  20 (!) 35  Temp:      TempSrc:      SpO2:      Weight:      Height:        Body mass index  is 24.58 kg/m.  General exam: Appears calm and comfortable.  Skin: No rashes, lesions or ulcers. HEENT: Atraumatic, normocephalic, supple neck, no obvious bleeding Lungs: Clear to auscultation bilaterally CVS: Regular rate and rhythm, no murmur GI/Abd soft, nontender, nondistended, bowel sound present CNS: Alert, awake, oriented x3 Psychiatry: Mood appropriate Extremities: No pedal edema, no calf tenderness  Discharge Instructions:  Wound care: None Discharge Instructions    (HEART FAILURE PATIENTS) Call MD:  Anytime you have any of the following symptoms: 1) 3 pound weight gain in 24 hours or 5 pounds in 1 week 2) shortness of breath, with or without a dry hacking cough 3) swelling in the hands, feet or stomach 4) if you have to sleep on extra pillows at night in order to breathe.   Complete by:  As directed    Avoid straining   Complete by:  As directed    Call MD for:  difficulty breathing, headache or visual disturbances   Complete by:  As directed    Diet - low sodium heart healthy   Complete by:  As directed    Diet - low sodium heart healthy   Complete by:  As directed    Heart Failure patients record your daily weight using the same scale at the same time of day   Complete by:  As directed    Increase activity slowly   Complete by:  As directed    Increase activity slowly   Complete by:  As directed    STOP any activity that causes chest pain, shortness of breath, dizziness, sweating, or exessive weakness   Complete by:  As directed      Follow-up Information    Abelino Derrick, PA-C Follow up on 06/23/2018.   Specialties:  Cardiology, Radiology Why:  at 11am for your follow up appt. You will need to wear a mask for this appt in the office.  Contact information: [address] [place] [address] [phone]        [facility] Follow up on 06/22/2018.   Why:  @ 2:00 pm with Dr. Lazarus Salines Telephone visit. They will call your  sister for the appointment. Please be at her home for this appointment. Contact information: [address] [place] [address] [phone]        [facility] Follow up.   Specialty:  Home Health Services Why:  Registered Nurse for medication and disease management.  Contact information: [address] [place] [address] [phone]          Allergies as of 06/16/2018   No Known Allergies     Medication List    STOP taking these medications   QUEtiapine 100 MG tablet Commonly known as:  SEROQUEL   risperiDONE 3 MG tablet Commonly known as:  RISPERDAL     TAKE these medications   aspirin 81 MG EC tablet Take 1 tablet (81 mg total) by mouth daily. Start taking on:  Jun 17, 2018   carvedilol 3.125 MG tablet Commonly known as:  COREG Take 1 tablet (3.125 mg total) by mouth 2 (two) times daily with a meal.   furosemide 20 MG tablet Commonly known as:  LASIX Take 1 tablet (20 mg total)  by mouth daily. Start taking on:  Jun 17, 2018   sacubitril-valsartan 24-26 MG Commonly known as:  ENTRESTO Take 1 tablet by mouth 2 (two) times daily.       Time coordinating discharge: 35 minutes  The results of significant diagnostics from this hospitalization (including imaging, microbiology, ancillary and laboratory) are listed below for reference.    Procedures and Diagnostic Studies:   Ct Angio Chest Pe W Or Wo Contrast  Result Date: 06/11/2018 CLINICAL DATA:  Cough and shortness of breath. EXAM: CT ANGIOGRAPHY CHEST WITH CONTRAST TECHNIQUE: Multidetector CT imaging of the chest was performed using the standard protocol during bolus administration of intravenous contrast. Multiplanar CT image reconstructions and MIPs were obtained to evaluate the vascular anatomy. CONTRAST:  OMNIPAQUE IOHEXOL 350 MG/ML SOLN COMPARISON:  Radiograph yesterday FINDINGS: Cardiovascular: There are no filling defects within the pulmonary arteries to  suggest pulmonary embolus. Subsegmental branches are not well assessed due to diminished contrast opacification. Prominent multi chamber cardiomegaly. Contrast refluxes into the hepatic veins and IVC. Persistent left SVC draining into the coronary sinus. Normal caliber thoracic aorta, cannot assess for dissection given phase of contrast. No pericardial effusion. Mediastinum/Nodes: Enlarged right hilar node measures 19 mm short axis. Small left hilar and mediastinal nodes. Thyroid gland is not well-defined, may be heterogeneously enlarged, however difficult to differentiate from the adjacent soft tissues at the thoracic inlet. Esophagus is decompressed. Lungs/Pleura: Small to moderate right and small left pleural effusion. Adjacent compressive atelectasis. Mild smooth septal thickening and scattered ground-glass opacities consistent with pulmonary edema. Mild bronchial thickening may also be congestive. Upper Abdomen: Contrast refluxes into the hepatic veins and IVC. No other acute finding. Musculoskeletal: There are no acute or suspicious osseous abnormalities. Review of the MIP images confirms the above findings. IMPRESSION: 1. No pulmonary embolus. 2. Findings consistent with congestive heart failure. Prominent cardiomegaly with bilateral pleural effusions and pulmonary edema. 3. Right hilar adenopathy is likely reactive. 4. Persistent left SVC draining into the coronary sinus. Electronically Signed   By: Narda Rutherford M.D.   On: 06/11/2018 03:20   Dg Chest Port 1 View  Result Date: 06/10/2018 CLINICAL DATA:  53 year old with one-week history of cough, shortness of breath, generalized abdominal pain and dark stools. EXAM: PORTABLE CHEST 1 VIEW COMPARISON:  None. FINDINGS: Cardiac silhouette markedly enlarged. Pulmonary vascularity normal. Streaky and patchy airspace opacities at the LEFT lung base. Lungs otherwise clear. No visible pleural effusions. IMPRESSION: 1. Atelectasis and/or bronchopneumonia  involving the LEFT lung base. 2. Marked cardiomegaly without pulmonary edema. Electronically Signed   By: Hulan Saas M.D.   On: 06/10/2018 16:50   Dg Abd Portable 1 View  Result Date: 06/10/2018 CLINICAL DATA:  Abdominal pain. EXAM: PORTABLE ABDOMEN - 1 VIEW COMPARISON:  None FINDINGS: The bowel gas pattern is normal. No radio-opaque calculi or other significant radiographic abnormality are seen. IMPRESSION: Negative. Electronically Signed   By: Signa Kell M.D.   On: 06/10/2018 19:18   US Abdomen Limited Ruq  Result Date: 06/10/2018 CLINICAL DATA:  Epigastric pain, elevated LFTs EXAM: ULTRASOUND ABDOMEN LIMITED RIGHT UPPER QUADRANT COMPARISON:  None. FINDINGS: Gallbladder: No gallstones or wall thickening visualized. No sonographic Murphy sign noted by sonographer. Common bile duct: Diameter: 5 mm Liver: No focal lesion identified. Within normal limits in parenchymal echogenicity. Portal vein is patent on color Doppler imaging with normal direction of blood flow towards the liver. Other: Right pleural effusion. IMPRESSION: 1. No ultrasound abnormality of the right upper quadrant to explain  pain. 2.  Right pleural effusion. Electronically Signed   By: Lauralyn Primes M.D.   On: 06/10/2018 19:33     Labs:   Basic Metabolic Panel: Recent Labs  Lab 06/12/18 0552 06/13/18 0426 06/13/18 0806 06/13/18 0816 06/14/18 0440 06/15/18 0315 06/16/18 0319  NA 136 134* 137 137 135 134* 136  K 3.6 3.6 3.6 3.8 4.6 4.7 4.5  CL 104 102  --   --  104 107 107  CO2 22 22  --   --  20* 20* 21*  GLUCOSE 175* 78  --   --  132* 242* 122*  BUN 22* 18  --   --  CREATININE 1.45* 1.20  --   --  1.24 1.21 1.17  CALCIUM 8.2* 8.6*  --   --  8.7* 8.7* 8.8*  MG 1.9  --   --   --  2.0 2.0 1.8   GFR Estimated Creatinine Clearance: 82 mL/min (by C-G formula based on SCr of 1.17 mg/dL). Liver Function Tests: Recent Labs  Lab 06/11/18 0854 06/12/18 0552 06/14/18 0440 06/15/18 0315 06/16/18 0319   AST 114* 78* 36 30 31  ALT 139* 135* 99* 77* 70*  ALKPHOS 169* 145* 148* 123 120  BILITOT 1.5* 0.8 0.6 0.4 0.5  PROT 5.8* 5.2* 5.9* 5.6* 5.7*  ALBUMIN 3.3* 3.0* 3.1* 2.9* 3.1*   Recent Labs  Lab 06/10/18 1649 06/14/18 0440  LIPASE 29 40   No results for input(s): AMMONIA in the last 168 hours. Coagulation profile No results for input(s): INR, PROTIME in the last 168 hours.  CBC: Recent Labs  Lab 06/10/18 1649 06/11/18 0854 06/12/18 0552 06/13/18 0426 06/13/18 0806 06/13/18 0816  WBC 5.0 5.1 5.3 5.0  --   --   NEUTROABS 2.2  --   --   --   --   --   HGB 13.4 13.3 13.4 13.9 14.3 14.3  HCT 41.1 40.1 40.7 40.9 42.0 42.0  MCV 86.2 82.9 83.1 82.3  --   --   PLT 238 232 241 234  --   --    Cardiac Enzymes: Recent Labs  Lab 06/10/18 1649 06/10/18 2054  TROPONINI 0.51* 0.41*   BNP: Invalid input(s): POCBNP CBG: Recent Labs  Lab 06/15/18 1137 06/15/18 1721 06/15/18 2134 06/16/18 0757 06/16/18 1216  GLUCAP 201* 252* 118* 123* 153*   D-Dimer No results for input(s): DDIMER in the last 72 hours. Hgb A1c No results for input(s): HGBA1C in the last 72 hours. Lipid Profile No results for input(s): CHOL, HDL, LDLCALC, TRIG, CHOLHDL, LDLDIRECT in the last 72 hours. Thyroid function studies No results for input(s): TSH, T4TOTAL, T3FREE, THYROIDAB in the last 72 hours.  Invalid input(s): FREET3 Anemia work up No results for input(s): VITAMINB12, FOLATE, FERRITIN, TIBC, IRON, RETICCTPCT in the last 72 hours. Microbiology Recent Results (from the past 240 hour(s))  Culture, blood (routine x 2)     Status: None   Collection Time: 06/10/18  5:30 PM  Result Value Ref Range Status   Specimen Description   Final    BLOOD LEFT ANTECUBITAL Performed at [facility], [address], [place], [place] [address]    Special Requests   Final    BOTTLES DRAWN AEROBIC AND ANAEROBIC Blood Culture adequate volume Performed at [facility],  [address] [place], [place] [address]    Culture   Final    NO GROWTH 5 DAYS Performed at [facility], [address] [place], [place] [address]    Report Status 06/16/2018 FINAL  Final  SARS Coronavirus 2 (CEPHEID- Performed in [facility] hospital lab), Hosp Order     Status: None   Collection Time: 06/10/18  6:03 PM  Result Value Ref Range Status   SARS Coronavirus 2 NEGATIVE NEGATIVE Final    Comment: (NOTE) If result is NEGATIVE SARS-CoV-2 target nucleic acids are NOT DETECTED. The SARS-CoV-2 RNA is generally detectable in upper and lower  respiratory specimens during the acute phase of infection. The lowest  concentration of SARS-CoV-2 viral copies this assay can detect is 250  copies / mL. A negative result does not preclude SARS-CoV-2 infection  and should not be used as the sole basis for treatment or other  patient management decisions.  A negative result may occur with  improper specimen collection / handling, submission of specimen other  than nasopharyngeal swab, presence of viral mutation(s) within the  areas targeted by this assay, and inadequate number of viral copies  (<250 copies / mL). A negative result must be combined with clinical  observations, patient history, and epidemiological information. If result is POSITIVE SARS-CoV-2 target nucleic acids are DETECTED. The SARS-CoV-2 RNA is generally detectable in upper and lower  respiratory specimens dur ing the acute phase of infection.  Positive  results are indicative of active infection with SARS-CoV-2.  Clinical  correlation with patient history and other diagnostic information is  necessary to determine patient infection status.  Positive results do  not rule out bacterial infection or co-infection with other viruses. If result is PRESUMPTIVE POSTIVE SARS-CoV-2 nucleic acids MAY BE PRESENT.   A presumptive positive result was obtained on the submitted specimen  and confirmed on repeat testing.   While 2019 novel coronavirus  (SARS-CoV-2) nucleic acids may be present in the submitted sample  additional confirmatory testing may be necessary for epidemiological  and / or clinical management purposes  to differentiate between  SARS-CoV-2 and other Sarbecovirus currently known to infect humans.  If clinically indicated additional testing with an alternate test  methodology [phone]) is advised. The SARS-CoV-2 RNA is generally  detectable in upper and lower respiratory sp ecimens during the acute  phase of infection. The expected result is Negative. Fact Sheet for Patients:  BoilerBrush.com.cy Fact Sheet for Healthcare Providers: https://pope.com/ This test is not yet approved or cleared by the [place] FDA and has been authorized for detection and/or diagnosis of SARS-CoV-2 by FDA under an Emergency Use Authorization (EUA).  This EUA will remain in effect (meaning this test can be used) for the duration of the COVID-19 declaration under Section 564(b)(1) of the Act, 21 U.S.C. section 360bbb-3(b)(1), unless the authorization is terminated or revoked sooner. Performed at [facility], [address] [place], [place] [address]   Culture, blood (routine x 2)     Status: None   Collection Time: 06/10/18  6:03 PM  Result Value Ref Range Status   Specimen Description   Final    BLOOD RIGHT FOREARM Performed at [facility], [address], [place], [place] [address]    Special Requests   Final    BOTTLES DRAWN AEROBIC ONLY Blood Culture results may not be optimal due to an inadequate volume of blood received in culture bottles Performed at [facility], [address], [place], [place] [address]    Culture   Final    NO GROWTH 5 DAYS Performed at [facility]  Lab, [address], [place], [place] [address]    Report Status 06/16/2018 FINAL  Final    Signed: Melina Schools Tayja Manzer  [facility] 06/16/2018, 3:45 PM

## 2018-06-16 NOTE — Progress Notes (Addendum)
BP stable after AM meds. Please be sure meds are given following instruction of Care Management from yesterdays note.  [facility] will sign off.   Medication Recommendations:  Stable on current regimen Other recommendations (labs, testing, etc):  BMET at [facility] Follow up as an outpatient:  [facility] cardiology arranged and AHF f/u also arranged. See Laverda Page note from yesterday.

## 2018-06-23 ENCOUNTER — Ambulatory Visit: Payer: Medicaid Other | Admitting: Cardiology

## 2018-07-13 ENCOUNTER — Telehealth: Payer: Self-pay

## 2018-07-13 ENCOUNTER — Encounter (HOSPITAL_COMMUNITY): Payer: Self-pay | Admitting: Internal Medicine

## 2018-07-13 ENCOUNTER — Emergency Department (HOSPITAL_COMMUNITY): Payer: Medicaid Other

## 2018-07-13 ENCOUNTER — Other Ambulatory Visit: Payer: Self-pay

## 2018-07-13 ENCOUNTER — Inpatient Hospital Stay (HOSPITAL_COMMUNITY)
Admission: EM | Admit: 2018-07-13 | Discharge: 2018-07-17 | DRG: 292 | Disposition: A | Discharge status: Home / Self Care | Payer: Medicaid Other | Attending: Family Medicine | Admitting: Family Medicine

## 2018-07-13 DIAGNOSIS — Z79899 Other long term (current) drug therapy: Secondary | ICD-10-CM | POA: Diagnosis not present

## 2018-07-13 DIAGNOSIS — F1410 Cocaine abuse, uncomplicated: Secondary | ICD-10-CM | POA: Diagnosis present

## 2018-07-13 DIAGNOSIS — I5023 Acute on chronic systolic (congestive) heart failure: Secondary | ICD-10-CM | POA: Diagnosis present

## 2018-07-13 DIAGNOSIS — I110 Hypertensive heart disease with heart failure: Principal | ICD-10-CM | POA: Diagnosis present

## 2018-07-13 DIAGNOSIS — Z7982 Long term (current) use of aspirin: Secondary | ICD-10-CM | POA: Diagnosis not present

## 2018-07-13 DIAGNOSIS — N179 Acute kidney failure, unspecified: Secondary | ICD-10-CM | POA: Diagnosis present

## 2018-07-13 DIAGNOSIS — F17210 Nicotine dependence, cigarettes, uncomplicated: Secondary | ICD-10-CM | POA: Diagnosis present

## 2018-07-13 DIAGNOSIS — Z23 Encounter for immunization: Secondary | ICD-10-CM

## 2018-07-13 DIAGNOSIS — E119 Type 2 diabetes mellitus without complications: Secondary | ICD-10-CM | POA: Diagnosis present

## 2018-07-13 DIAGNOSIS — K2970 Gastritis, unspecified, without bleeding: Secondary | ICD-10-CM | POA: Diagnosis present

## 2018-07-13 DIAGNOSIS — R05 Cough: Secondary | ICD-10-CM | POA: Diagnosis present

## 2018-07-13 DIAGNOSIS — F329 Major depressive disorder, single episode, unspecified: Secondary | ICD-10-CM | POA: Diagnosis present

## 2018-07-13 DIAGNOSIS — Z20828 Contact with and (suspected) exposure to other viral communicable diseases: Secondary | ICD-10-CM | POA: Diagnosis present

## 2018-07-13 DIAGNOSIS — I4510 Unspecified right bundle-branch block: Secondary | ICD-10-CM | POA: Diagnosis present

## 2018-07-13 DIAGNOSIS — I509 Heart failure, unspecified: Secondary | ICD-10-CM | POA: Diagnosis not present

## 2018-07-13 DIAGNOSIS — R9431 Abnormal electrocardiogram [ECG] [EKG]: Secondary | ICD-10-CM | POA: Diagnosis present

## 2018-07-13 LAB — CBC
HCT: 44.3 % (ref 39.0–52.0)
HCT: 39.6 % (ref 39.0–52.0)
Hemoglobin: 14.3 g/dL (ref 13.0–17.0)
Hemoglobin: 13 g/dL (ref 13.0–17.0)
MCH: 27.4 pg (ref 26.0–34.0)
MCH: 27 pg (ref 26.0–34.0)
MCHC: 32.3 g/dL (ref 30.0–36.0)
MCHC: 32.8 g/dL (ref 30.0–36.0)
MCV: 84.9 fL (ref 80.0–100.0)
MCV: 82.3 fL (ref 80.0–100.0)
Platelets: 226 10*3/uL (ref 150–400)
Platelets: 201 10*3/uL (ref 150–400)
RBC: 5.22 MIL/uL (ref 4.22–5.81)
RBC: 4.81 MIL/uL (ref 4.22–5.81)
RDW: 13.6 % (ref 11.5–15.5)
RDW: 13.3 % (ref 11.5–15.5)
WBC: 4.8 10*3/uL (ref 4.0–10.5)
WBC: 4.6 10*3/uL (ref 4.0–10.5)
nRBC: 0 % (ref 0.0–0.2)
nRBC: 0 % (ref 0.0–0.2)

## 2018-07-13 LAB — GLUCOSE, CAPILLARY: Glucose-Capillary: 163 mg/dL — ABNORMAL HIGH (ref 70–99)

## 2018-07-13 LAB — BASIC METABOLIC PANEL
Anion gap: 10 (ref 5–15)
BUN: 22 mg/dL — ABNORMAL HIGH (ref 6–20)
CO2: 20 mmol/L — ABNORMAL LOW (ref 22–32)
Calcium: 8.9 mg/dL (ref 8.9–10.3)
Chloride: 109 mmol/L (ref 98–111)
Creatinine, Ser: 1.56 mg/dL — ABNORMAL HIGH (ref 0.61–1.24)
GFR calc Af Amer: 58 mL/min — ABNORMAL LOW (ref >60)
GFR calc non Af Amer: 50 mL/min — ABNORMAL LOW (ref >60)
Glucose, Bld: 186 mg/dL — ABNORMAL HIGH (ref 70–99)
Potassium: 4.1 mmol/L (ref 3.5–5.1)
Sodium: 139 mmol/L (ref 135–145)

## 2018-07-13 LAB — TROPONIN I
Troponin I: 0.03 ng/mL (ref <0.03)
Troponin I: 0.03 ng/mL (ref <0.03)
Troponin I: 0.03 ng/mL (ref <0.03)

## 2018-07-13 LAB — MAGNESIUM: Magnesium: 1.7 mg/dL (ref 1.7–2.4)

## 2018-07-13 LAB — HEPATIC FUNCTION PANEL
ALT: 52 U/L — ABNORMAL HIGH (ref 0–44)
AST: 24 U/L (ref 15–41)
Albumin: 3.4 g/dL — ABNORMAL LOW (ref 3.5–5.0)
Alkaline Phosphatase: 113 U/L (ref 38–126)
Bilirubin, Direct: 0.2 mg/dL (ref 0.0–0.2)
Indirect Bilirubin: 0.6 mg/dL (ref 0.3–0.9)
Total Bilirubin: 0.8 mg/dL (ref 0.3–1.2)
Total Protein: 6.1 g/dL — ABNORMAL LOW (ref 6.5–8.1)

## 2018-07-13 LAB — CREATININE, SERUM
Creatinine, Ser: 1.62 mg/dL — ABNORMAL HIGH (ref 0.61–1.24)
GFR calc Af Amer: 56 mL/min — ABNORMAL LOW (ref >60)
GFR calc non Af Amer: 48 mL/min — ABNORMAL LOW (ref >60)

## 2018-07-13 LAB — TSH: TSH: 1.403 u[IU]/mL (ref 0.350–4.500)

## 2018-07-13 LAB — BRAIN NATRIURETIC PEPTIDE: B Natriuretic Peptide: 4315.9 pg/mL — ABNORMAL HIGH (ref 0.0–100.0)

## 2018-07-13 LAB — LIPASE, BLOOD: Lipase: 30 U/L (ref 11–51)

## 2018-07-13 MED ORDER — PNEUMOCOCCAL VAC POLYVALENT 25 MCG/0.5ML IJ INJ
0.5000 mL | INJECTION | INTRAMUSCULAR | Status: AC
  Administered 2018-07-14: 0.5 mL via INTRAMUSCULAR
  Filled 2018-07-13: qty 0.5

## 2018-07-13 MED ORDER — ACETAMINOPHEN 325 MG PO TABS: 650.0000 mg | ORAL_TABLET | Freq: Four times a day (QID) | ORAL | Status: DC | PRN

## 2018-07-13 MED ORDER — ASPIRIN EC 81 MG PO TBEC
81.0000 mg | DELAYED_RELEASE_TABLET | Freq: Every day | ORAL | Status: DC
  Administered 2018-07-14 – 2018-07-17 (×4): 81 mg via ORAL
  Filled 2018-07-13 (×6): qty 1

## 2018-07-13 MED ORDER — ACETAMINOPHEN 650 MG RE SUPP: 650.0000 mg | Freq: Four times a day (QID) | RECTAL | Status: DC | PRN

## 2018-07-13 MED ORDER — ENOXAPARIN SODIUM 40 MG/0.4ML ~~LOC~~ SOLN
40.0000 mg | Freq: Every day | SUBCUTANEOUS | Status: DC
  Administered 2018-07-13 – 2018-07-16 (×4): 40 mg via SUBCUTANEOUS
  Filled 2018-07-13 (×4): qty 0.4

## 2018-07-13 MED ORDER — FUROSEMIDE 10 MG/ML IJ SOLN
20.0000 mg | Freq: Once | INTRAMUSCULAR | Status: AC
  Administered 2018-07-13: 20 mg via INTRAVENOUS
  Filled 2018-07-13: qty 2

## 2018-07-13 MED ORDER — INSULIN ASPART 100 UNIT/ML ~~LOC~~ SOLN
0.0000 [IU] | Freq: Three times a day (TID) | SUBCUTANEOUS | Status: DC
  Administered 2018-07-14: 1 [IU] via SUBCUTANEOUS
  Administered 2018-07-14 – 2018-07-15 (×2): 5 [IU] via SUBCUTANEOUS
  Administered 2018-07-16: 2 [IU] via SUBCUTANEOUS
  Administered 2018-07-16: 1 [IU] via SUBCUTANEOUS

## 2018-07-13 MED ORDER — FUROSEMIDE 10 MG/ML IJ SOLN: 20.0000 mg | Freq: Two times a day (BID) | INTRAMUSCULAR | Status: DC

## 2018-07-13 MED ORDER — FUROSEMIDE 10 MG/ML IJ SOLN
40.0000 mg | Freq: Two times a day (BID) | INTRAMUSCULAR | Status: DC
  Administered 2018-07-14 (×2): 40 mg via INTRAVENOUS
  Filled 2018-07-13 (×3): qty 4

## 2018-07-13 MED ORDER — CARVEDILOL 3.125 MG PO TABS
3.1250 mg | ORAL_TABLET | Freq: Two times a day (BID) | ORAL | Status: DC
  Administered 2018-07-13 – 2018-07-17 (×8): 3.125 mg via ORAL
  Filled 2018-07-13 (×8): qty 1

## 2018-07-13 MED ORDER — SODIUM CHLORIDE 0.9% FLUSH
3.0000 mL | Freq: Once | INTRAVENOUS | Status: AC
  Administered 2018-07-13: 3 mL via INTRAVENOUS

## 2018-07-13 MED ORDER — SACUBITRIL-VALSARTAN 24-26 MG PO TABS
1.0000 | ORAL_TABLET | Freq: Two times a day (BID) | ORAL | Status: DC
  Administered 2018-07-13 – 2018-07-14 (×2): 1 via ORAL
  Filled 2018-07-13 (×2): qty 1

## 2018-07-13 NOTE — ED Triage Notes (Signed)
Pt here from home with c/o sob , cough and chest pain , hx of chf and smoking

## 2018-07-13 NOTE — ED Notes (Signed)
ED TO INPATIENT HANDOFF REPORT  ED Nurse Name and Phone #: Judson Roch [phone]  S Name/Age/Gender Kerry Hamilton 53 y.o. male Room/Bed: 024C/024C  Code Status   Code Status: Prior  Home/SNF/Other Home Patient oriented to: self, place, time and situation Is this baseline? Yes   Triage Complete: Triage complete  Chief Complaint CHF  Triage Note Pt here from home with c/o sob , cough and chest pain , hx of chf and smoking    Allergies No Known Allergies  Level of Care/Admitting Diagnosis ED Disposition    ED Disposition Condition [place]: [place] [[id number]]  Level of Care: Telemetry Cardiac [103]  Covid Evaluation: Screening Protocol (No Symptoms)  Diagnosis: Acute on chronic systolic CHF (congestive heart failure) [facility]) [[id number]]  Admitting Physician: [name] [phone]  Attending Physician: [name] [phone]  Estimated length of stay: past midnight tomorrow  Certification:: I certify this patient will need inpatient services for at least 2 midnights  PT Class (Do Not Modify): Inpatient [101]  PT Acc Code (Do Not Modify): Private [1]       B Medical/Surgery History Past Medical History:  Diagnosis Date  . Depression   . Diabetes mellitus without complication [facility])    Past Surgical History:  Procedure Laterality Date  . RIGHT/LEFT HEART CATH AND CORONARY ANGIOGRAPHY N/A 06/13/2018   Procedure: RIGHT/LEFT HEART CATH AND CORONARY ANGIOGRAPHY;  Surgeon: Martinique, Peter M, MD;  Location: [place] [facility];  Service: Cardiovascular;  Laterality: N/A;     A IV Location/Drains/Wounds Patient Lines/Drains/Airways Status   Active Line/Drains/Airways    Name:   Placement date:   Placement time:   Site:   Days:   Peripheral IV 07/13/18 Left Hand   07/13/18    1923    Hand   less than 1          Intake/Output Last 24 hours No intake or output data in the 24 hours ending 07/13/18 1958  Labs/Imaging Results for  orders placed or performed during the hospital encounter of 07/13/18 (from the past 48 hour(s))  Basic metabolic panel     Status: Abnormal   Collection Time: 07/13/18  5:09 PM  Result Value Ref Range   Sodium 139 135 - 145 mmol/L   Potassium 4.1 3.5 - 5.1 mmol/L   Chloride 109 98 - 111 mmol/L   CO2 20 (L) 22 - 32 mmol/L   Glucose, Bld 186 (H) 70 - 99 mg/dL   BUN 22 (H) 6 - 20 mg/dL   Creatinine, Ser 1.56 (H) 0.61 - 1.24 mg/dL   Calcium 8.9 8.9 - 10.3 mg/dL   GFR calc non Af Amer 50 (L) >60 mL/min   GFR calc Af Amer 58 (L) >60 mL/min   Anion gap 10 5 - 15    Comment: Performed at [facility], [address] [place], [place] [address]  CBC     Status: None   Collection Time: 07/13/18  5:09 PM  Result Value Ref Range   WBC 4.8 4.0 - 10.5 K/uL   RBC 5.22 4.22 - 5.81 MIL/uL   Hemoglobin 14.3 13.0 - 17.0 g/dL   HCT 44.3 39.0 - 52.0 %   MCV 84.9 80.0 - 100.0 fL   MCH 27.4 26.0 - 34.0 pg   MCHC 32.3 30.0 - 36.0 g/dL   RDW 13.6 11.5 - 15.5 %   Platelets 226 150 - 400 K/uL   nRBC 0.0 0.0 -  0.2 %    Comment: Performed at [facility], [address], [place], [place] [address]  Troponin I - ONCE - STAT     Status: Abnormal   Collection Time: 07/13/18  5:09 PM  Result Value Ref Range   Troponin I 0.03 (HH) <0.03 ng/mL    Comment: CRITICAL RESULT CALLED TO, READ BACK BY AND VERIFIED WITH: Antonieta Loveless RN AT 1842 ON [id number] BY Marveen Reeks Performed at [facility], [address], [place], [place] [address]   Lipase, blood     Status: None   Collection Time: 07/13/18  5:28 PM  Result Value Ref Range   Lipase 30 11 - 51 U/L    Comment: Performed at [facility], [address] [place], [place] [address]  Brain natriuretic peptide     Status: Abnormal   Collection Time: 07/13/18  5:59 PM  Result Value Ref Range   B Natriuretic Peptide 4,315.9 (H) 0.0 - 100.0 pg/mL    Comment: Performed at [facility], [address], [place], [place] [address]  Hepatic  function panel     Status: Abnormal   Collection Time: 07/13/18  5:59 PM  Result Value Ref Range   Total Protein 6.1 (L) 6.5 - 8.1 g/dL   Albumin 3.4 (L) 3.5 - 5.0 g/dL   AST 24 15 - 41 U/L   ALT 52 (H) 0 - 44 U/L   Alkaline Phosphatase 113 38 - 126 U/L   Total Bilirubin 0.8 0.3 - 1.2 mg/dL   Bilirubin, Direct 0.2 0.0 - 0.2 mg/dL   Indirect Bilirubin 0.6 0.3 - 0.9 mg/dL    Comment: Performed at [facility], [address], [place], [place] [address]   Dg Chest 2 View  Result Date: 07/13/2018 CLINICAL DATA:  Congestive heart failure. EXAM: CHEST - 2 VIEW COMPARISON:  Jun 10, 2018 FINDINGS: There is cardiomegaly, similar to prior study. There is generalized volume overload. There are likely small bilateral pleural effusions. There is no pneumothorax. No acute osseous abnormality. There is no displaced fracture. IMPRESSION: Cardiomegaly with findings of congestive heart failure including small bilateral pleural effusions. Electronically Signed   By: [name] M.D.   On: 07/13/2018 19:06    Pending Labs Unresulted Labs (From admission, onward)    Start     Ordered   07/13/18 2015  Troponin I - Once-Timed  Once-Timed,   STAT     07/13/18 1933   07/13/18 1913  Novel Coronavirus,NAA,(SEND-OUT TO REF LAB - TAT 24-48 hrs); Hosp Order  (Asymptomatic Patients Labs)  Once,   STAT    Question:  Rule Out  Answer:  Yes   07/13/18 1912   Signed and Held  CBC  (enoxaparin (LOVENOX)    CrCl >/= 30 ml/min)  Once,   R    Comments: Baseline for enoxaparin therapy IF NOT ALREADY DRAWN.  Notify MD if PLT < 100 K.    Signed and Held   Signed and Held  Creatinine, serum  (enoxaparin (LOVENOX)    CrCl >/= 30 ml/min)  Once,   R    Comments: Baseline for enoxaparin therapy IF NOT ALREADY DRAWN.    Signed and Held   Signed and Held  Creatinine, serum  (enoxaparin (LOVENOX)    CrCl >/= 30 ml/min)  Weekly,   R    Comments: while on enoxaparin therapy    Signed and Held   Signed and Held  Magnesium  Once,   R     Signed and Held   Signed and Held  Comprehensive metabolic panel  Tomorrow morning,   R     Signed and Held   Signed and Held  TSH  Once,   R     Signed and Held   Signed and Held  Troponin I - Now Then Q6H  Now then every 6 hours,   R     Signed and Held          Vitals/Pain Today's Vitals   07/13/18 1730 07/13/18 1844 07/13/18 1900 07/13/18 1930  BP: (!) 126/94  (!) 133/102 127/87  Pulse: 81  84 96  Resp: (!) 21  12   Temp:      TempSrc:      SpO2: 98%  99% 92%  PainSc:  10-Worst pain ever      Isolation Precautions No active isolations  Medications Medications  sodium chloride flush (NS) 0.9 % injection 3 mL (3 mLs Intravenous Given 07/13/18 1928)  furosemide (LASIX) injection 20 mg (20 mg Intravenous Given 07/13/18 1923)    Mobility walks     Focused Assessments Pulmonary Assessment Handoff:  Lung sounds:   O2 Device: Room Air        R Recommendations: See Admitting Provider Note  Report given to:   Additional Notes:

## 2018-07-13 NOTE — ED Notes (Signed)
Patient transported to X-ray 

## 2018-07-13 NOTE — Telephone Encounter (Signed)
Spoke with patient's sister, Blaiden Werth, per dpr, and advised her that I needed to move her  Brother's appt up 15 minutes due to schedule changes. She was agreeable and voice understanding. Appt moved to match flex clinic template.

## 2018-07-13 NOTE — Telephone Encounter (Signed)
    COVID-19 Pre-Screening Questions:  . In the past 7 to 10 days have you had a cough,  shortness of breath, headache, congestion, fever (100 or greater) body aches, chills, sore throat, or sudden loss of taste or sense of smell? NO  . Have you been around anyone with known Covid 19. NO . Have you been around anyone who is awaiting Covid 19 test results in the past 7 to 10 days? NO . Have you been around anyone who has been exposed to Covid 19, or has mentioned symptoms of Covid 19 within the past 7 to 10 days? NO  If you have any concerns/questions about symptoms patients report during screening (either on the phone or at threshold). Contact the provider seeing the patient or DOD for further guidance.  If neither are available contact a member of the leadership team.        Patient was advised to wear a mask if he has one if not one will be provided and the visitor restrictions (No visitors allowed except if needed to assist patient).

## 2018-07-13 NOTE — H&P (Signed)
History and Physical    Kelton Bultman GUY:[id number] DOB: 14-May-1965 DOA: 07/13/2018  PCP: Patient, No Pcp Per  Patient coming from: Home.  Chief Complaint: Shortness of breath.  HPI: Kerry Hamilton is a 53 y.o. male with history of diabetes mellitus type 2, hypertension polysubstance abuse including cocaine and alcohol presents to the ER with complaints of shortness of breath which has been ongoing last 2 days.  Shortness of breath mostly happens on exertion he also has been having some chest burning sensation on exertion.  Denies vomiting or diarrhea abdominal pain.  Has noted some swelling of the lower extremities and abdominal girth has been increased.  Patient states he has been compliant with his medications.  He states he has decreased his alcohol intake and only takes once or twice a week last drink was 3 days ago for his birthday.  Cocaine was used about a week ago.  He also has been having some nonproductive cough but no fever or chills. During the admission last month for CHF patient had left and right heart cath and coronaries were unremarkable.  ED Course: In the ER chest x-ray shows bilateral pleural effusion and findings consistent with CHF.  EKG shows sinus rhythm with RBBB.  Labs show creatinine 1.5 which is increased from baseline BNP of 4300.  Troponin 0.03.  Patient was given Lasix 20 mg IV following which patient has having some good diuresis.  Patient admitted for acute on chronic systolic heart failure.  Review of Systems: As per HPI, rest all negative.   Past Medical History:  Diagnosis Date  . Depression   . Diabetes mellitus without complication [facility])     Past Surgical History:  Procedure Laterality Date  . RIGHT/LEFT HEART CATH AND CORONARY ANGIOGRAPHY N/A 06/13/2018   Procedure: RIGHT/LEFT HEART CATH AND CORONARY ANGIOGRAPHY;  Surgeon: Martinique, Peter M, MD;  Location: [place] [facility];  Service: Cardiovascular;  Laterality: N/A;     reports that he has been smoking  cigarettes. He has never used smokeless tobacco. He reports current alcohol use of about 4.0 standard drinks of alcohol per week. He reports that he does not use drugs.  No Known Allergies  Family History  Family history unknown: Yes    Prior to Admission medications   Medication Sig Start Date End Date Taking? Authorizing Provider  aspirin EC 81 MG EC tablet Take 1 tablet (81 mg total) by mouth daily. 06/17/18 09/15/18  Terrilee Croak, MD  carvedilol (COREG) 3.125 MG tablet Take 1 tablet (3.125 mg total) by mouth 2 (two) times daily with a meal. 06/16/18 09/14/18  Dahal, Marlowe Aschoff, MD  furosemide (LASIX) 20 MG tablet Take 1 tablet (20 mg total) by mouth daily. 06/17/18 09/15/18  Dahal, Marlowe Aschoff, MD  sacubitril-valsartan (ENTRESTO) 24-26 MG Take 1 tablet by mouth 2 (two) times daily. 06/16/18 09/14/18  Terrilee Croak, MD    Physical Exam: Vitals:   07/13/18 1706 07/13/18 1730 07/13/18 1900 07/13/18 1930  BP: (!) 122/92 (!) 126/94 (!) 133/102 127/87  Pulse: 86 81 84 96  Resp: 18 (!) 21 12   Temp: 97.8 F (36.6 C)     TempSrc: Oral     SpO2: 100% 98% 99% 92%      Constitutional: Moderately built and nourished. Vitals:   07/13/18 1706 07/13/18 1730 07/13/18 1900 07/13/18 1930  BP: (!) 122/92 (!) 126/94 (!) 133/102 127/87  Pulse: 86 81 84 96  Resp: 18 (!) 21 12   Temp: 97.8 F (36.6 C)  TempSrc: Oral     SpO2: 100% 98% 99% 92%   Eyes: Nonicteric no pallor. ENMT: No discharge from the ears eyes nose and mouth. Neck: No mass felt.  No neck rigidity. Respiratory: No rhonchi or crepitations. Cardiovascular: S1-S2 heard. Abdomen: Soft nontender bowel sounds present. Musculoskeletal: No edema.  No joint effusion.   Skin: No rash. Neurologic: Alert awake oriented to time place and person.  Moves all extremities. Psychiatric: Appears normal per normal affect.   Labs on Admission: I have personally reviewed following labs and imaging studies  CBC: Recent Labs  Lab 07/13/18 1709  WBC  4.8  HGB 14.3  HCT 44.3  MCV 84.9  PLT 226   Basic Metabolic Panel: Recent Labs  Lab 07/13/18 1709  NA 139  K 4.1  CL 109  CO2 20*  GLUCOSE 186*  BUN 22*  CREATININE 1.56*  CALCIUM 8.9   GFR: CrCl cannot be calculated (Unknown ideal weight.). Liver Function Tests: Recent Labs  Lab 07/13/18 1759  AST 24  ALT 52*  ALKPHOS 113  BILITOT 0.8  PROT 6.1*  ALBUMIN 3.4*   Recent Labs  Lab 07/13/18 1728  LIPASE 30   No results for input(s): AMMONIA in the last 168 hours. Coagulation Profile: No results for input(s): INR, PROTIME in the last 168 hours. Cardiac Enzymes: Recent Labs  Lab 07/13/18 1709  TROPONINI 0.03*   BNP (last 3 results) No results for input(s): PROBNP in the last 8760 hours. HbA1C: No results for input(s): HGBA1C in the last 72 hours. CBG: No results for input(s): GLUCAP in the last 168 hours. Lipid Profile: No results for input(s): CHOL, HDL, LDLCALC, TRIG, CHOLHDL, LDLDIRECT in the last 72 hours. Thyroid Function Tests: No results for input(s): TSH, T4TOTAL, FREET4, T3FREE, THYROIDAB in the last 72 hours. Anemia Panel: No results for input(s): VITAMINB12, FOLATE, FERRITIN, TIBC, IRON, RETICCTPCT in the last 72 hours. Urine analysis:    Component Value Date/Time   COLORURINE YELLOW 06/10/2018 1614   APPEARANCEUR CLEAR 06/10/2018 1614   LABSPEC 1.032 (H) 06/10/2018 1614   PHURINE 5.0 06/10/2018 1614   GLUCOSEU NEGATIVE 06/10/2018 1614   HGBUR NEGATIVE 06/10/2018 1614   BILIRUBINUR NEGATIVE 06/10/2018 1614   KETONESUR NEGATIVE 06/10/2018 1614   PROTEINUR 100 (A) 06/10/2018 1614   NITRITE NEGATIVE 06/10/2018 1614   LEUKOCYTESUR NEGATIVE 06/10/2018 1614   Sepsis Labs: @LABRCNTIP (procalcitonin:4,lacticidven:4) )No results found for this or any previous visit (from the past 240 hour(s)).   Radiological Exams on Admission: Dg Chest 2 View  Result Date: 07/13/2018 CLINICAL DATA:  Congestive heart failure. EXAM: CHEST - 2 VIEW  COMPARISON:  Jun 10, 2018 FINDINGS: There is cardiomegaly, similar to prior study. There is generalized volume overload. There are likely small bilateral pleural effusions. There is no pneumothorax. No acute osseous abnormality. There is no displaced fracture. IMPRESSION: Cardiomegaly with findings of congestive heart failure including small bilateral pleural effusions. Electronically Signed   By: Katherine Mantlehristopher  Green M.D.   On: 07/13/2018 19:06    EKG: Independently reviewed.  Normal sinus rhythm with right bundle branch block.  Assessment/Plan Principal Problem:   Acute on chronic systolic CHF (congestive heart failure) (HCC) Active Problems:   Diabetes mellitus type 2, noninsulin dependent (HCC)   Prolonged QT interval   ARF (acute renal failure) (HCC)    1. Acute on chronic systolic heart failure last EF measured last month was 15% at that time patient also had a left heart cath and right heart cath.  We will continue  Lasix 20 mg IV every 12.  Patient is on Entresto.  Closely follow intake output daily weights metabolic panel.  Patient is on Coreg.  Which may have to be held if cocaine is positive in his urine drug screen. 2. Chest discomfort on exertion has had unremarkable cardiac cath last month.  Troponin has been mildly elevated and was elevated last month also.  We will cycle cardiac markers. 3. Acute renal failure creatinine is increased from baseline.  Will check UA.  Patient is on Entresto which may need to be held if there is any further worsening of creatinine. 4. Cough -patient has been exam nonproductive cough.  Patient is afebrile.  COVID-19 test is pending. 5. Diabetes mellitus type 2 on diet.  We will keep patient on sliding scale coverage. 6. Polysubstance abuse including cocaine and alcohol -patient states his last cocaine intake was about a week ago.  If urine drug screen is positive for cocaine may have to hold Coreg.  Patient states he only drinks alcohol once or twice last  drink 3 days ago for his birthday.  Closely watch out for any withdrawal.   DVT prophylaxis: Lovenox. Code Status: Full code. Family Communication: Discussed with patient. Disposition Plan: Home. Consults called: None. Admission status: Inpatient.   Eduard Clos MD [facility] Pager [phone].  If 7PM-7AM, please contact night-coverage [email or web address] Password [facility]  07/13/2018, 7:57 PM

## 2018-07-13 NOTE — ED Provider Notes (Signed)
[facility] Provider Note   CSN: [id number] Arrival date & time: 07/13/18  1658    History   Chief Complaint No chief complaint on file.   HPI Kerry Hamilton is a 53 y.o. male.     The history is provided by the patient and medical records.  Chest Pain Pain location:  Substernal area Pain quality: burning   Pain quality: no pressure, not stabbing, not tearing, not throbbing and no tightness   Pain radiates to:  Does not radiate Pain severity:  Moderate Onset quality:  Gradual Duration:  2 days Timing:  Intermittent Progression:  Unchanged Chronicity:  Recurrent Context: movement, at rest and stress   Context: not breathing and not trauma   Relieved by:  Nothing Worsened by:  Coughing, smoking, exertion and movement Ineffective treatments:  Rest, leaning forward and certain positions Associated symptoms: cough, fatigue, lower extremity edema, orthopnea, PND and shortness of breath   Associated symptoms: no back pain, no diaphoresis, no fever, no nausea, no vomiting and no weakness   Risk factors: diabetes mellitus, male sex and smoking     Past Medical History:  Diagnosis Date  . Depression   . Diabetes mellitus without complication [facility](HCC)     Patient Active Problem List   Diagnosis Date Noted  . Acute systolic CHF (congestive heart failure) (HCC)   . Elevated troponin   . Shortness of breath   . LFT elevation   . Prolonged QT interval   . Polysubstance abuse (HCC)   . ACS (acute coronary syndrome) (HCC) 06/10/2018  . Diabetes mellitus type 2, noninsulin dependent (HCC) 06/10/2018  . Depression 06/10/2018    Past Surgical History:  Procedure Laterality Date  . RIGHT/LEFT HEART CATH AND CORONARY ANGIOGRAPHY N/A 06/13/2018   Procedure: RIGHT/LEFT HEART CATH AND CORONARY ANGIOGRAPHY;  Surgeon: [place]azil[place]Jordan, Peter M, MD;  [facility];  Service: Cardiovascular;  Laterality: N/A;        Home Medications     Prior to Admission medications   Medication Sig Start Date End Date Taking? Authorizing Provider  aspirin EC 81 MG EC tablet Take 1 tablet (81 mg total) by mouth daily. 06/17/18 09/15/18  Lorin Glassahal, Binaya, MD  carvedilol (COREG) 3.125 MG tablet Take 1 tablet (3.125 mg total) by mouth 2 (two) times daily with a meal. 06/16/18 09/14/18  Dahal, Melina SchoolsBinaya, MD  furosemide (LASIX) 20 MG tablet Take 1 tablet (20 mg total) by mouth daily. 06/17/18 09/15/18  Dahal, Melina SchoolsBinaya, MD  sacubitril-valsartan (ENTRESTO) 24-26 MG Take 1 tablet by mouth 2 (two) times daily. 06/16/18 09/14/18  Lorin Glassahal, Binaya, MD    Family History No family history on file.  Social History Social History   Tobacco Use  . Smoking status: Current Some Day Smoker    Types: Cigarettes  . Smokeless tobacco: Never Used  Substance Use Topics  . Alcohol use: Yes    Alcohol/week: 4.0 standard drinks    Types: 4 Cans of beer per week  . Drug use: No     Allergies   Patient has no known allergies.   Review of Systems Review of Systems  Constitutional: Positive for fatigue. Negative for diaphoresis and fever.  Respiratory: Positive for cough and shortness of breath.   Cardiovascular: Positive for chest pain, orthopnea and PND.  Gastrointestinal: Negative for nausea and vomiting.  Musculoskeletal: Negative for back pain.  Neurological: Negative for weakness.  All other systems reviewed and are negative.    Physical Exam Updated Vital  Signs BP (!) 126/94   Pulse 81   Temp 97.8 F (36.6 C) (Oral)   Resp (!) 21   SpO2 98%   Physical Exam Vitals signs and nursing note reviewed.  Constitutional:      Appearance: He is well-developed.  HENT:     Head: Normocephalic and atraumatic.  Eyes:     Conjunctiva/sclera: Conjunctivae normal.  Neck:     Musculoskeletal: Neck supple.     Comments: JVD present when patient lies supine Cardiovascular:     Rate and Rhythm: Normal rate.  Pulmonary:     Effort: Pulmonary effort is normal. No  respiratory distress.     Breath sounds: No stridor. No wheezing or rhonchi.  Abdominal:     Palpations: Abdomen is soft.     Tenderness: There is no abdominal tenderness.  Musculoskeletal:     Comments: Trace bilateral lower extremity pitting edema  Skin:    General: Skin is warm and dry.     Capillary Refill: Capillary refill takes less than 2 seconds.  Neurological:     General: No focal deficit present.     Mental Status: He is alert and oriented to person, place, and time.      ED Treatments / Results  Labs (all labs ordered are listed, but only abnormal results are displayed) Labs Reviewed  BASIC METABOLIC PANEL - Abnormal; Notable for the following components:      Result Value   CO2 20 (*)    Glucose, Bld 186 (*)    BUN 22 (*)    Creatinine, Ser 1.56 (*)    GFR calc non Af Amer 50 (*)    GFR calc Af Amer 58 (*)    All other components within normal limits  TROPONIN I - Abnormal; Notable for the following components:   Troponin I 0.03 (*)    All other components within normal limits  BRAIN NATRIURETIC PEPTIDE - Abnormal; Notable for the following components:   B Natriuretic Peptide 4,315.9 (*)    All other components within normal limits  HEPATIC FUNCTION PANEL - Abnormal; Notable for the following components:   Total Protein 6.1 (*)    Albumin 3.4 (*)    ALT 52 (*)    All other components within normal limits  NOVEL CORONAVIRUS, NAA (HOSPITAL ORDER, SEND-OUT TO REF LAB)  CBC  LIPASE, BLOOD    EKG EKG Interpretation  Date/Time:  Thursday July 13 2018 17:08:32 EDT Ventricular Rate:  86 PR Interval:  182 QRS Duration: 150 QT Interval:  412 QTC Calculation: 493 R Axis:   114 Text Interpretation:  Normal sinus rhythm Right bundle branch block T wave abnormality, consider inferior ischemia No significant change since last tracing Confirmed by Gwyneth Sproutlunkett, Whitney ([id number]) on 07/13/2018 7:12:28 PM   Radiology Dg Chest 2 View  Result Date: 07/13/2018 CLINICAL  DATA:  Congestive heart failure. EXAM: CHEST - 2 VIEW COMPARISON:  Jun 10, 2018 FINDINGS: There is cardiomegaly, similar to prior study. There is generalized volume overload. There are likely small bilateral pleural effusions. There is no pneumothorax. No acute osseous abnormality. There is no displaced fracture. IMPRESSION: Cardiomegaly with findings of congestive heart failure including small bilateral pleural effusions. Electronically Signed   By: Katherine Mantlehristopher  Green M.D.   On: 07/13/2018 19:06    Procedures Procedures (including critical care time)  Medications Ordered in ED Medications  sodium chloride flush (NS) 0.9 % injection 3 mL (3 mLs Intravenous Given 07/13/18 1928)  furosemide (LASIX) injection 20 mg (  20 mg Intravenous Given 07/13/18 1923)     Initial Impression / Assessment and Plan / ED Course  I have reviewed the triage vital signs and the nursing notes.  Pertinent labs & imaging results that were available during my care of the patient were reviewed by me and considered in my medical decision making (see chart for details).        Medical Decision Making: Marreo Uselman is a 53 y.o. male who presented to the ED today with chest burning, shortness of breath.  Past medical history significant for diabetes, depression, pack per day smoker, recently diagnosed heart failure Reviewed and confirmed nursing documentation for past medical history, family history, social history.  Recently admitted from 516/20 until 06/16/2018, echo revealed ejection fraction of 15% Patient started on Coreg, Entresto, Lasix Cardiac catheterization revealed no acute CAD  On my initial exam, the pt was calm, cooperative, conversant, follows commands appropriately, GCS 15, not tachycardic, not hypotensive, afebrile, no increased work of breathing or respiratory distress, no signs of impending respiratory failure.   The patient's risk factors for ACS were reviewed as well as the EKG. The CXR assists in  r/o Pneumonia, Pneumothorax, Esophageal Tears.  No focal lung findings suggestive of pneumonia or pneumothorax. The patient does not appear to have a Pulmonary Embolism based on the Wells Score and PERC rule and there is no apparent asymmetric upper extremity or lower extremity edema/swelling suggestive of DVT.  Patient denies any fevers, change in chest pain with leaning forward or lying down making pericarditis, myocarditis less likely. There does not appear to be an Aortic Dissection either based on physical exam, historically pain not abrupt in onset, tearing or ripping, pulses symmetric. MSK strain vs Costochondritis are also of consideration however (-)Rooster crowing sign, pain not reproducible with palpation.  Etiology could also be secondary to reflux disease as patient describes pain as burning however patient has no history of reflux.  EKG (my interpretation):      NS rhythm with a rate of  86.      QRS 150. QTc 493. PR 182      Right bundle branch block with nonspecific ST segment changes.      No acute changes suggestive of hyperkalemia.      No WPW or Brugada's Syndrome. When compared to previous ECGs from 06/12/18 change no new concerning changes found.  Will obtain chest x-ray, cardiac enzymes, blood work for further evaluation and care Troponin 0.03, previously was 0.5 during last admission White blood cells 4.8, hemoglobin 14.3, creatinine 1.5, BUN 22, CO2 20 BNP 4300, previously was around 1500 during most recent admission CXR showed cardiomegaly with findings concerning for heart failure with small bilateral pleural effusions  Patient endorsed cocaine use and worsening diet to my attending.  Could be contributory to presentation. Patient given IV diuretics All radiology and laboratory studies reviewed independently and with my attending physician, agree with reading provided by radiologist unless otherwise noted.  Upon reassessing patient, patient was calm, resting comfortably  Based on the above findings, I believe patient requires admission. Patient admitted. The above care was discussed with and agreed upon by my attending physician. Emergency Department Medication Summary:  Medications  sodium chloride flush (NS) 0.9 % injection 3 mL (3 mLs Intravenous Given 07/13/18 1928)  furosemide (LASIX) injection 20 mg (20 mg Intravenous Given 07/13/18 1923)   Final Clinical Impressions(s) / ED Diagnoses   Final diagnoses:  None    ED Discharge Orders    None  Lonzo Candy, MD 07/13/18 Rodolph Bong    Blanchie Dessert, MD 07/13/18 2031

## 2018-07-14 DIAGNOSIS — I5023 Acute on chronic systolic (congestive) heart failure: Secondary | ICD-10-CM

## 2018-07-14 DIAGNOSIS — N179 Acute kidney failure, unspecified: Secondary | ICD-10-CM

## 2018-07-14 DIAGNOSIS — E119 Type 2 diabetes mellitus without complications: Secondary | ICD-10-CM

## 2018-07-14 LAB — COMPREHENSIVE METABOLIC PANEL
ALT: 42 U/L (ref 0–44)
AST: 18 U/L (ref 15–41)
Albumin: 3 g/dL — ABNORMAL LOW (ref 3.5–5.0)
Alkaline Phosphatase: 88 U/L (ref 38–126)
Anion gap: 11 (ref 5–15)
BUN: 22 mg/dL — ABNORMAL HIGH (ref 6–20)
CO2: 19 mmol/L — ABNORMAL LOW (ref 22–32)
Calcium: 8.5 mg/dL — ABNORMAL LOW (ref 8.9–10.3)
Chloride: 111 mmol/L (ref 98–111)
Creatinine, Ser: 1.52 mg/dL — ABNORMAL HIGH (ref 0.61–1.24)
GFR calc Af Amer: >60 mL/min (ref >60)
GFR calc non Af Amer: 52 mL/min — ABNORMAL LOW (ref >60)
Glucose, Bld: 102 mg/dL — ABNORMAL HIGH (ref 70–99)
Potassium: 3.9 mmol/L (ref 3.5–5.1)
Sodium: 141 mmol/L (ref 135–145)
Total Bilirubin: 1 mg/dL (ref 0.3–1.2)
Total Protein: 5.3 g/dL — ABNORMAL LOW (ref 6.5–8.1)

## 2018-07-14 LAB — RAPID URINE DRUG SCREEN, HOSP PERFORMED
Amphetamines: NOT DETECTED
Barbiturates: NOT DETECTED
Benzodiazepines: NOT DETECTED
Cocaine: NOT DETECTED
Opiates: NOT DETECTED
Tetrahydrocannabinol: NOT DETECTED

## 2018-07-14 LAB — GLUCOSE, CAPILLARY
Glucose-Capillary: 105 mg/dL — ABNORMAL HIGH (ref 70–99)
Glucose-Capillary: 300 mg/dL — ABNORMAL HIGH (ref 70–99)
Glucose-Capillary: 150 mg/dL — ABNORMAL HIGH (ref 70–99)
Glucose-Capillary: 131 mg/dL — ABNORMAL HIGH (ref 70–99)

## 2018-07-14 LAB — TROPONIN I
Troponin I: 0.03 ng/mL (ref <0.03)
Troponin I: 0.03 ng/mL (ref <0.03)

## 2018-07-14 MED ORDER — PANTOPRAZOLE SODIUM 40 MG PO TBEC
40.0000 mg | DELAYED_RELEASE_TABLET | Freq: Every day | ORAL | Status: DC
  Administered 2018-07-14 – 2018-07-17 (×4): 40 mg via ORAL
  Filled 2018-07-14 (×4): qty 1

## 2018-07-14 MED ORDER — INSULIN ASPART 100 UNIT/ML ~~LOC~~ SOLN: 0.0000 [IU] | Freq: Three times a day (TID) | SUBCUTANEOUS | Status: DC

## 2018-07-14 NOTE — Progress Notes (Signed)
PROGRESS NOTE    Kerry Hamilton  [id number] DOB: 04-26-1965 DOA: 07/13/2018 PCP: Patient, No Pcp Per   Brief Narrative: Kerry Hamilton is a 53 y.o. male with history of diabetes mellitus type 2, alcohol use, systolic heart failure, hypertension polysubstance abuse. He presented secondary to shortness of breath. He was found to have an acute heart failure episode. Started on IV lasix.   Assessment & Plan:   Principal Problem:   Acute on chronic systolic CHF (congestive heart failure) (HCC) Active Problems:   Diabetes mellitus type 2, noninsulin dependent (HCC)   Prolonged QT interval   ARF (acute renal failure) (HCC)   Acute on chronic systolic heart failure EF of 15% in May 2020. Elevated BNP. Started on Lasix. On room air currently. -Hold Entresto -Continue Lasix 40 mg IV BID -Daily weights, strict in and out -Continue Coreg  Chest discomfort Seems more epigastric than chest. It is worse on movement, however. Liver enzymes wnl. Troponin negative. Possible gastritis. -Protonix  Acute kidney injury Baseline creatinine of about 1.1-1.2. Creatinine of 1.56 on admission with peak of 1.62. Slightly improved this morning but not at baseline -Diuresis as mentioned above -Will need to monitor closely since patient's outpatient medications increase risk of worsening kidney injury in setting of aggressive diuresis  Cough Nonproductive. In setting of heart failure. COVID-19 test pending.  Diabetes mellitus, type 2 -Continue SSI  History of polysubstance abuse Toxicology clean.    DVT prophylaxis: Lovenox Code Status:   Code Status: Full Code Family Communication: None Disposition Plan: Discharge home likely in 24 hours if creatinine continues to improve   Consultants:   None  Procedures:   None  Antimicrobials:  None    Subjective: Patient is breathing better today but still with chest pain, especially with ambulation.  Objective: Vitals:   07/13/18 2015  07/13/18 2136 07/14/18 0603 07/14/18 0948  BP: (!) 124/94 (!) 129/98 (!) 130/92 (!) 141/93  Pulse:  84 79   Resp: (!) 33 16    Temp:  98.2 F (36.8 C) 98.3 F (36.8 C)   TempSrc:  Oral Oral   SpO2:  100%    Weight:  87.4 kg 87.1 kg   Height:  6' (1.829 m)      Intake/Output Summary (Last 24 hours) at 07/14/2018 1207 Last data filed at 07/14/2018 0948 Gross per 24 hour  Intake 240 ml  Output 950 ml  Net -710 ml   Filed Weights   07/13/18 2136 07/14/18 0603  Weight: 87.4 kg 87.1 kg    Examination:  General exam: Appears calm and comfortable Respiratory system: Diminished breath sounds auscultation. Respiratory effort normal. Cardiovascular system: S1 & S2 heard, RRR. No murmurs, rubs, gallops or clicks. Gastrointestinal system: Abdomen is nondistended, soft and nontender. No organomegaly or masses felt. Normal bowel sounds heard. Central nervous system: Alert and oriented. No focal neurological deficits. Extremities: No edema. No calf tenderness Skin: No cyanosis. No rashes Psychiatry: Judgement and insight appear normal. Mood & affect appropriate.     Data Reviewed: I have personally reviewed following labs and imaging studies  CBC: Recent Labs  Lab 07/13/18 1709 07/13/18 2223  WBC 4.8 4.6  HGB 14.3 13.0  HCT 44.3 39.6  MCV 84.9 82.3  PLT 226 578   Basic Metabolic Panel: Recent Labs  Lab 07/13/18 1709 07/13/18 2223 07/14/18 0416  NA 139  --  141  K 4.1  --  3.9  CL 109  --  111  CO2 20*  --  19*  GLUCOSE 186*  --  102*  BUN 22*  --  22*  CREATININE 1.56* 1.62* 1.52*  CALCIUM 8.9  --  8.5*  MG  --  1.7  --    GFR: Estimated Creatinine Clearance: 62.4 mL/min (A) (by C-G formula based on SCr of 1.52 mg/dL (H)). Liver Function Tests: Recent Labs  Lab 07/13/18 1759 07/14/18 0416  AST 24 18  ALT 52* 42  ALKPHOS 113 88  BILITOT 0.8 1.0  PROT 6.1* 5.3*  ALBUMIN 3.4* 3.0*   Recent Labs  Lab 07/13/18 1728  LIPASE 30   No results for input(s):  AMMONIA in the last 168 hours. Coagulation Profile: No results for input(s): INR, PROTIME in the last 168 hours. Cardiac Enzymes: Recent Labs  Lab 07/13/18 1709 07/13/18 2015 07/13/18 2223 07/14/18 0416 07/14/18 0958  TROPONINI 0.03* 0.03* 0.03* 0.03* 0.03*   BNP (last 3 results) No results for input(s): PROBNP in the last 8760 hours. HbA1C: No results for input(s): HGBA1C in the last 72 hours. CBG: Recent Labs  Lab 07/13/18 2228 07/14/18 0802  GLUCAP 163* 105*   Lipid Profile: No results for input(s): CHOL, HDL, LDLCALC, TRIG, CHOLHDL, LDLDIRECT in the last 72 hours. Thyroid Function Tests: Recent Labs    07/13/18 2223  TSH 1.403   Anemia Panel: No results for input(s): VITAMINB12, FOLATE, FERRITIN, TIBC, IRON, RETICCTPCT in the last 72 hours. Sepsis Labs: No results for input(s): PROCALCITON, LATICACIDVEN in the last 168 hours.  No results found for this or any previous visit (from the past 240 hour(s)).       Radiology Studies: Dg Chest 2 View  Result Date: 07/13/2018 CLINICAL DATA:  Congestive heart failure. EXAM: CHEST - 2 VIEW COMPARISON:  Jun 10, 2018 FINDINGS: There is cardiomegaly, similar to prior study. There is generalized volume overload. There are likely small bilateral pleural effusions. There is no pneumothorax. No acute osseous abnormality. There is no displaced fracture. IMPRESSION: Cardiomegaly with findings of congestive heart failure including small bilateral pleural effusions. Electronically Signed   By: [name] M.D.   On: 07/13/2018 19:06        Scheduled Meds: . aspirin EC  81 mg Oral Daily  . carvedilol  3.125 mg Oral BID WC  . enoxaparin (LOVENOX) injection  40 mg Subcutaneous QHS  . furosemide  40 mg Intravenous BID  . insulin aspart  0-9 Units Subcutaneous TID WC  . sacubitril-valsartan  1 tablet Oral BID   Continuous Infusions:   LOS: 1 day     Jacquelin Hawking, MD [facility] 07/14/2018, 12:07 PM  If  7PM-7AM, please contact night-coverage [email or web address]

## 2018-07-15 LAB — NOVEL CORONAVIRUS, NAA (HOSP ORDER, SEND-OUT TO REF LAB; TAT 18-24 HRS): SARS-CoV-2, NAA: NOT DETECTED

## 2018-07-15 LAB — BASIC METABOLIC PANEL
Anion gap: 12 (ref 5–15)
BUN: 27 mg/dL — ABNORMAL HIGH (ref 6–20)
CO2: 23 mmol/L (ref 22–32)
Calcium: 8.8 mg/dL — ABNORMAL LOW (ref 8.9–10.3)
Chloride: 105 mmol/L (ref 98–111)
Creatinine, Ser: 1.67 mg/dL — ABNORMAL HIGH (ref 0.61–1.24)
GFR calc Af Amer: 54 mL/min — ABNORMAL LOW (ref >60)
GFR calc non Af Amer: 46 mL/min — ABNORMAL LOW (ref >60)
Glucose, Bld: 170 mg/dL — ABNORMAL HIGH (ref 70–99)
Potassium: 3.6 mmol/L (ref 3.5–5.1)
Sodium: 140 mmol/L (ref 135–145)

## 2018-07-15 LAB — GLUCOSE, CAPILLARY
Glucose-Capillary: 119 mg/dL — ABNORMAL HIGH (ref 70–99)
Glucose-Capillary: 271 mg/dL — ABNORMAL HIGH (ref 70–99)
Glucose-Capillary: 98 mg/dL (ref 70–99)
Glucose-Capillary: 136 mg/dL — ABNORMAL HIGH (ref 70–99)

## 2018-07-15 MED ORDER — FUROSEMIDE 20 MG PO TABS
20.0000 mg | ORAL_TABLET | Freq: Every day | ORAL | Status: DC
  Administered 2018-07-15: 20 mg via ORAL
  Filled 2018-07-15: qty 1

## 2018-07-15 NOTE — Progress Notes (Signed)
PROGRESS NOTE    Kerry Hamilton  [id number] DOB: [birth year] DOA: 07/13/2018 PCP: Patient, No Pcp Per   Brief Narrative: Kerry Hamilton is a 53 y.o. male with history of diabetes mellitus type 2, alcohol use, systolic heart failure, hypertension polysubstance abuse. He presented secondary to shortness of breath. He was found to have an acute heart failure episode. Started on IV lasix.   Assessment & Plan:   Principal Problem:   Acute on chronic systolic CHF (congestive heart failure) (HCC) Active Problems:   Diabetes mellitus type 2, noninsulin dependent (HCC)   Prolonged QT interval   ARF (acute renal failure) (HCC)   Acute on chronic systolic heart failure EF of 15% in May 2020. Elevated BNP. Started on Lasix. On room air currently. -Hold Entresto -Discontinue IV lasix and switch to home Lasix 20 mg daily -Daily weights, strict in and out -Continue Coreg -Follow-up BMP  Chest discomfort Seems more epigastric than chest. It is worse on movement, however. Liver enzymes wnl. Troponin negative. Possible gastritis. -Protonix  Acute kidney injury Baseline creatinine of about 1.1-1.2. Creatinine of 1.56 on admission with peak of 1.67. Which is worsened in setting of diuresis in addition to Entresto use -Diuresis as mentioned above -Will need to monitor closely since patient's outpatient medications increase risk of worsening kidney injury in setting of aggressive diuresis  Cough Nonproductive. In setting of heart failure. COVID-19 test pending.  Diabetes mellitus, type 2 -Continue SSI  History of polysubstance abuse Toxicology clean.    DVT prophylaxis: Lovenox Code Status:   Code Status: Full Code Family Communication: None Disposition Plan: Creatine worsening; hopeful discharge home in 24 hours pending improvement of creatinine following changes to management today   Consultants:   None  Procedures:   None  Antimicrobials:  None    Subjective: No dyspnea  overnight.  Objective: Vitals:   07/14/18 1306 07/14/18 2144 07/15/18 0500 07/15/18 1036  BP: 113/75 115/85 98/68 107/81  Pulse: 79     Resp: 15 18 20    Temp: 98.2 F (36.8 C) 97.9 F (36.6 C) 97.8 F (36.6 C)   TempSrc: Oral Oral Oral   SpO2: 94% 95%    Weight:   85.8 kg   Height:        Intake/Output Summary (Last 24 hours) at 07/15/2018 1319 Last data filed at 07/15/2018 0835 Gross per 24 hour  Intake 360 ml  Output 600 ml  Net -240 ml   Filed Weights   07/13/18 2136 07/14/18 0603 07/15/18 0500  Weight: 87.4 kg 87.1 kg 85.8 kg    Examination:  General exam: Appears calm and comfortable  Respiratory system: Clear to auscultation. Respiratory effort normal. Cardiovascular system: S1 & S2 heard, RRR. No murmurs, rubs, gallops or clicks. Gastrointestinal system: Abdomen is nondistended, soft and nontender. No organomegaly or masses felt. Normal bowel sounds heard. Central nervous system: Alert and oriented. No focal neurological deficits. Extremities: No edema. No calf tenderness Skin: No cyanosis. No rashes Psychiatry: Judgement and insight appear normal. Mood & affect appropriate.     Data Reviewed: I have personally reviewed following labs and imaging studies  CBC: Recent Labs  Lab 07/13/18 1709 07/13/18 2223  WBC 4.8 4.6  HGB 14.3 13.0  HCT 44.3 39.6  MCV 84.9 82.3  PLT 226 201   Basic Metabolic Panel: Recent Labs  Lab 07/13/18 1709 07/13/18 2223 07/14/18 0416 07/15/18 0800  NA 139  --  141 140  K 4.1  --  3.9 3.6  CL 109  --  111 105  CO2 20*  --  19* 23  GLUCOSE 186*  --  102* 170*  BUN 22*  --  22* 27*  CREATININE 1.56* 1.62* 1.52* 1.67*  CALCIUM 8.9  --  8.5* 8.8*  MG  --  1.7  --   --    GFR: Estimated Creatinine Clearance: 56.8 mL/min (A) (by C-G formula based on SCr of 1.67 mg/dL (H)). Liver Function Tests: Recent Labs  Lab 07/13/18 1759 07/14/18 0416  AST 24 18  ALT 52* 42  ALKPHOS 113 88  BILITOT 0.8 1.0  PROT 6.1* 5.3*   ALBUMIN 3.4* 3.0*   Recent Labs  Lab 07/13/18 1728  LIPASE 30   No results for input(s): AMMONIA in the last 168 hours. Coagulation Profile: No results for input(s): INR, PROTIME in the last 168 hours. Cardiac Enzymes: Recent Labs  Lab 07/13/18 1709 07/13/18 2015 07/13/18 2223 07/14/18 0416 07/14/18 0958  TROPONINI 0.03* 0.03* 0.03* 0.03* 0.03*   BNP (last 3 results) No results for input(s): PROBNP in the last 8760 hours. HbA1C: No results for input(s): HGBA1C in the last 72 hours. CBG: Recent Labs  Lab 07/14/18 1254 07/14/18 1703 07/14/18 2317 07/15/18 0756 07/15/18 1128  GLUCAP 300* 150* 131* 119* 271*   Lipid Profile: No results for input(s): CHOL, HDL, LDLCALC, TRIG, CHOLHDL, LDLDIRECT in the last 72 hours. Thyroid Function Tests: Recent Labs    07/13/18 2223  TSH 1.403   Anemia Panel: No results for input(s): VITAMINB12, FOLATE, FERRITIN, TIBC, IRON, RETICCTPCT in the last 72 hours. Sepsis Labs: No results for input(s): PROCALCITON, LATICACIDVEN in the last 168 hours.  No results found for this or any previous visit (from the past 240 hour(s)).       Radiology Studies: Dg Chest 2 View  Result Date: 07/13/2018 CLINICAL DATA:  Congestive heart failure. EXAM: CHEST - 2 VIEW COMPARISON:  Jun 10, 2018 FINDINGS: There is cardiomegaly, similar to prior study. There is generalized volume overload. There are likely small bilateral pleural effusions. There is no pneumothorax. No acute osseous abnormality. There is no displaced fracture. IMPRESSION: Cardiomegaly with findings of congestive heart failure including small bilateral pleural effusions. Electronically Signed   By: Constance Holster M.D.   On: 07/13/2018 19:06        Scheduled Meds: . aspirin EC  81 mg Oral Daily  . carvedilol  3.125 mg Oral BID WC  . enoxaparin (LOVENOX) injection  40 mg Subcutaneous QHS  . furosemide  40 mg Intravenous BID  . insulin aspart  0-9 Units Subcutaneous TID WC  .  pantoprazole  40 mg Oral Daily   Continuous Infusions:   LOS: 2 days     Cordelia Poche, MD [facility] 07/15/2018, 1:19 PM  If 7PM-7AM, please contact night-coverage [email or web address]

## 2018-07-16 LAB — GLUCOSE, CAPILLARY
Glucose-Capillary: 89 mg/dL (ref 70–99)
Glucose-Capillary: 156 mg/dL — ABNORMAL HIGH (ref 70–99)
Glucose-Capillary: 132 mg/dL — ABNORMAL HIGH (ref 70–99)
Glucose-Capillary: 163 mg/dL — ABNORMAL HIGH (ref 70–99)

## 2018-07-16 MED ORDER — SACUBITRIL-VALSARTAN 24-26 MG PO TABS
1.0000 | ORAL_TABLET | Freq: Two times a day (BID) | ORAL | Status: DC
  Administered 2018-07-16 – 2018-07-17 (×3): 1 via ORAL
  Filled 2018-07-16 (×3): qty 1

## 2018-07-16 NOTE — Progress Notes (Signed)
Patient ambulated in hall on RA. Tolerated well. Denied any lightheadedness, dizziness or SOB.

## 2018-07-16 NOTE — Progress Notes (Signed)
PROGRESS NOTE    Kerry Hamilton  DDU:[id number] DOB: 04/29/1965 DOA: 07/13/2018 PCP: Patient, No Pcp Per   Brief Narrative:  53 y.o. male with history of diabetes mellitus type 2, alcohol use, systolic heart failure, hypertension polysubstance abuse. He presented secondary to shortness of breath. He was found to have an acute heart failure episode. Started on IV lasix.   Assessment & Plan:   Principal Problem:   Acute on chronic systolic CHF (congestive heart failure) (HCC) Active Problems:   Diabetes mellitus type 2, noninsulin dependent (HCC)   Prolonged QT interval   ARF (acute renal failure) (HCC)   Acute on chronic systolic heart failure--EF of 15% in May 2020. cardiorenal syndrome exacerbated by low flow state with an EF of 15% Discontinue Lasix today Resume Entresto low-dose Trend creatinine a.m. Daily weights are down--1.7 L since admit Ambulate DC monitors If cough no better a.m. chest x-ray  Chest discomfort-chronic-->troponins have been negative since admission EKG shows no acute findings Polysubstance/cocaine abuse Likely gastritis, continue Protonix  Acute kidney injury Baseline creatinine of about 1.1-1.2. Creatinine of 1.56 on admission with peak of 1.67. Which is worsened in setting of diuresis in addition   Cough Nonproductive. In setting of heart failure. COVID-19 test [-] 6/18  Diabetes mellitus, type 2 Continue SSI-A1c 517 2020 months 6.3  History of polysubstance abuse Toxicology clean. Advised he needs to stop smoking cigarettes   DVT prophylaxis: Lovenox Code Status:   Code Status: Full Code Family Communication: None Disposition Plan:  Still not ready for discharge creatinine still elevated needs Entresto >Lasix May need chest x-ray a.m. if cough is no better may need to substitute off of Entresto given ARB induced cough?  Although unlikely   Consultants:   None  Procedures:   None  Antimicrobials:  None    Subjective:   Pleasant oriented main complaint is cough -this is  unproductive  No chills no fever No chest pain at present time  Objective: Vitals:   07/15/18 1036 07/15/18 1519 07/15/18 1800 07/16/18 0524  BP: 107/81 105/74 104/75 106/79  Pulse:  83  74  Resp:    16  Temp:  97.6 F (36.4 C)  98 F (36.7 C)  TempSrc:  Oral  Oral  SpO2:  96%  99%  Weight:    85.3 kg  Height:        Intake/Output Summary (Last 24 hours) at 07/16/2018 0852 Last data filed at 07/16/2018 0500 Gross per 24 hour  Intake 960 ml  Output 875 ml  Net 85 ml   Filed Weights   07/14/18 0603 07/15/18 0500 07/16/18 0524  Weight: 87.1 kg 85.8 kg 85.3 kg    Examination:  Awake alert coherent no distress looks younger than stated age External ocular movements intact Throat is soft supple Mallampati 2 No JVD No lower extremity edema S1-S2 no murmur Monitor show sinus rhythm Neurologically intact no focal deficit  Data Reviewed: I have personally reviewed following labs and imaging studies  CBC: Recent Labs  Lab 07/13/18 1709 07/13/18 2223  WBC 4.8 4.6  HGB 14.3 13.0  HCT 44.3 39.6  MCV 84.9 82.3  PLT 226 237   Basic Metabolic Panel: Recent Labs  Lab 07/13/18 1709 07/13/18 2223 07/14/18 0416 07/15/18 0800  NA 139  --  141 140  K 4.1  --  3.9 3.6  CL 109  --  111 105  CO2 20*  --  19* 23  GLUCOSE 186*  --  102* 170*  BUN 22*  --  22* 27*  CREATININE 1.56* 1.62* 1.52* 1.67*  CALCIUM 8.9  --  8.5* 8.8*  MG  --  1.7  --   --    GFR: Estimated Creatinine Clearance: 56.8 mL/min (A) (by C-G formula based on SCr of 1.67 mg/dL (H)). Liver Function Tests: Recent Labs  Lab 07/13/18 1759 07/14/18 0416  AST 24 18  ALT 52* 42  ALKPHOS 113 88  BILITOT 0.8 1.0  PROT 6.1* 5.3*  ALBUMIN 3.4* 3.0*   Recent Labs  Lab 07/13/18 1728  LIPASE 30   No results for input(s): AMMONIA in the last 168 hours. Coagulation Profile: No results for input(s): INR, PROTIME in the last 168 hours. Cardiac  Enzymes: Recent Labs  Lab 07/13/18 1709 07/13/18 2015 07/13/18 2223 07/14/18 0416 07/14/18 0958  TROPONINI 0.03* 0.03* 0.03* 0.03* 0.03*   BNP (last 3 results) No results for input(s): PROBNP in the last 8760 hours. HbA1C: No results for input(s): HGBA1C in the last 72 hours. CBG: Recent Labs  Lab 07/15/18 0756 07/15/18 1128 07/15/18 1655 07/15/18 2035 07/16/18 0757  GLUCAP 119* 271* 98 136* 89   Lipid Profile: No results for input(s): CHOL, HDL, LDLCALC, TRIG, CHOLHDL, LDLDIRECT in the last 72 hours. Thyroid Function Tests: Recent Labs    07/13/18 2223  TSH 1.403   Anemia Panel: No results for input(s): VITAMINB12, FOLATE, FERRITIN, TIBC, IRON, RETICCTPCT in the last 72 hours. Sepsis Labs: No results for input(s): PROCALCITON, LATICACIDVEN in the last 168 hours.  Recent Results (from the past 240 hour(s))  Novel Coronavirus,NAA,(SEND-OUT TO REF LAB - TAT 24-48 hrs); Hosp Order     Status: None   Collection Time: 07/13/18  7:28 PM   Specimen: Nasopharyngeal Swab; Respiratory  Result Value Ref Range Status   SARS-CoV-2, NAA NOT DETECTED NOT DETECTED Final    Comment: (NOTE) This test was developed and its performance characteristics determined by [employer]. This test has not been FDA cleared or approved. This test has been authorized by FDA under an Emergency Use Authorization (EUA). This test is only authorized for the duration of time the declaration that circumstances exist justifying the authorization of the emergency use of in vitro diagnostic tests for detection of SARS-CoV-2 virus and/or diagnosis of COVID-19 infection under section 564(b)(1) of the Act, 21 U.S.C. 235TIR-4(E)(3), unless the authorization is terminated or revoked sooner. When diagnostic testing is negative, the possibility of a false negative result should be considered in the context of a patient's recent exposures and the presence of clinical signs and symptoms consistent with  COVID-19. An individual without symptoms of COVID-19 and who is not shedding SARS-CoV-2 virus would expect to have a negative (not detected) result in this assay. Performed  At: [facility] [address] [place], [place] [id number] [name] MD PP:[id number]    Coronavirus Source NASOPHARYNGEAL  Final    Comment: Performed at [facility], [address], [place], [place] [address]         Radiology Studies: No results found.      Scheduled Meds: . aspirin EC  81 mg Oral Daily  . carvedilol  3.125 mg Oral BID WC  . enoxaparin (LOVENOX) injection  40 mg Subcutaneous QHS  . insulin aspart  0-9 Units Subcutaneous TID WC  . pantoprazole  40 mg Oral Daily  . sacubitril-valsartan  1 tablet Oral BID   Continuous Infusions:   LOS: 3 days   Pleas Koch, MD [facility] 8:58 AM  If 7PM-7AM, please contact night-coverage [email or web address]

## 2018-07-17 LAB — COMPREHENSIVE METABOLIC PANEL
ALT: 27 U/L (ref 0–44)
AST: 16 U/L (ref 15–41)
Albumin: 3.1 g/dL — ABNORMAL LOW (ref 3.5–5.0)
Alkaline Phosphatase: 93 U/L (ref 38–126)
Anion gap: 7 (ref 5–15)
BUN: 23 mg/dL — ABNORMAL HIGH (ref 6–20)
CO2: 21 mmol/L — ABNORMAL LOW (ref 22–32)
Calcium: 8.4 mg/dL — ABNORMAL LOW (ref 8.9–10.3)
Chloride: 108 mmol/L (ref 98–111)
Creatinine, Ser: 1.35 mg/dL — ABNORMAL HIGH (ref 0.61–1.24)
GFR calc Af Amer: >60 mL/min (ref >60)
GFR calc non Af Amer: 60 mL/min — ABNORMAL LOW (ref >60)
Glucose, Bld: 125 mg/dL — ABNORMAL HIGH (ref 70–99)
Potassium: 4 mmol/L (ref 3.5–5.1)
Sodium: 136 mmol/L (ref 135–145)
Total Bilirubin: 0.6 mg/dL (ref 0.3–1.2)
Total Protein: 5.6 g/dL — ABNORMAL LOW (ref 6.5–8.1)

## 2018-07-17 LAB — GLUCOSE, CAPILLARY
Glucose-Capillary: 118 mg/dL — ABNORMAL HIGH (ref 70–99)
Glucose-Capillary: 157 mg/dL — ABNORMAL HIGH (ref 70–99)

## 2018-07-17 MED ORDER — PANTOPRAZOLE SODIUM 40 MG PO TBEC: 40.0000 mg | DELAYED_RELEASE_TABLET | Freq: Every day | ORAL | 0 refills | Status: DC

## 2018-07-17 MED ORDER — FUROSEMIDE 20 MG PO TABS: 20.0000 mg | ORAL_TABLET | ORAL | 0 refills | Status: DC

## 2018-07-17 NOTE — Progress Notes (Signed)
Orthostatic vital signs collected on patient.  Lying BP: 121/95 Lying Pulse: 82  Sitting BP: 125/84 Sitting Pulse: 84  Standing 0 min BP: 128/88 Standing 0 min Pulse: 87  Standing 3 min BP: 123/94 Standing 3 min Pulse: 85  Ambulated patient in hallway on RA. O2 sats maintained above 99%. Patient did complain of sharp, stabbing pain in upper abdomen during walk.

## 2018-07-17 NOTE — Progress Notes (Signed)
Patient called RN stating he was feeling short of breath.  RN checked oxygen saturation and was 100% on room air.  No respiratory distress noted per RN, patient able to speak in full sentences.  Patient stated he felt "a bubbling" in the center of his chest.  Patient stated it was a similar feeling as he has had before when the "fluid was building up".  RN encouraged patient to update MD on how he was feeling during morning round. Patient stated understanding.  RN and patient then ambulated on unit.  Oxygen saturation monitored during ambulation and stayed greater than 92% on room air, patient able to carry on a conversation with RN during ambulation.  Patient continues to have a dry cough.

## 2018-07-17 NOTE — Discharge Summary (Addendum)
Physician Discharge Summary  Kerry Hamilton ACZ:[id number] DOB: 31-Jan-1965 DOA: 07/13/2018  PCP: Patient, No Pcp Per  Admit date: 07/13/2018 Discharge date: 07/17/2018  Time spent: 22 minutes  Recommendations for Outpatient Follow-up:  1. Cease substances and counselling provided--will need to follow up as OP 2. Needs labs 1 week--CM to help get PCP  Discharge Diagnoses:  Principal Problem:   Acute on chronic systolic CHF (congestive heart failure) (HCC) Active Problems:   Diabetes mellitus type 2, noninsulin dependent (HCC)   Prolonged QT interval   ARF (acute renal failure) ([place])   Discharge Condition: improved but gaurded  Diet recommendation: fair  Filed Weights   07/15/18 0500 07/16/18 0524 07/17/18 0552  Weight: 85.8 kg 85.3 kg 86.6 kg    History of present illness:  53 y/o ? diabetes mellitus type 2, alcohol use, systolic heart failure, hypertension polysubstance abuse. He presented secondary to shortness of breath. He was found to have an acute heart failure episode. Started on IV lasix He seemed to improve in hospital  Acute on chronic systolic heart failure--EF of 15% in May 2020. cardiorenal syndrome exacerbated by low flow state with an EF of 15% Resume Entresto low-dose, diuresed and stopped lasix IV--started on po lasix qod on d/c Daily weights are down--1.9 L since admit Ambulate DC monitors  Chest discomfort-chronic-->troponins have been negative since admission EKG shows no acute findings Polysubstance/cocaine abuse gastritis, continue Protonix He had abd discomfort and sob, but vitals were neg in addition to orthostatics and he has been warned extensively about the risks of Cocaine abuse  Acute kidney injury Baseline creatinine of about 1.1-1.2. Creatinine of 1.56 on admission with peak of 1.67. Which is worsened in setting of diuresis in addition ---I discussed withDr. Hilty-patient is set up to see Cardiology soon  Cough Nonproductive. In setting of  heart failure. COVID-19 test [-] 6/18  Diabetes mellitus, type 2 Continue SSI-A1c 06/11/2018 months 6.3  History of polysubstance abuse Toxicology clean. Advised he needs to stop smoking cigarettes   Consultations:  none  Discharge Exam: Vitals:   07/17/18 0151 07/17/18 0552  BP:  113/90  Pulse:  80  Resp:    Temp:  97.8 F (36.6 C)  SpO2: 100% 96%    General: awake alert pleasant nad Cardiovascular: 1s2 no m/r/g Respiratory: clear no added sound abd soft nt nd no rebound no gaurding Neuro intat no focal deficit  Discharge Instructions   Discharge Instructions    Diet - low sodium heart healthy   Complete by: As directed    Discharge instructions   Complete by: As directed    Change your lasix dose from daily to every other day Continue your entresto Please desist from use of substances Please contorl your salt intake   Increase activity slowly   Complete by: As directed      Allergies as of 07/17/2018   No Known Allergies     Medication List    TAKE these medications   aspirin 81 MG EC tablet Take 1 tablet (81 mg total) by mouth daily.   carvedilol 3.125 MG tablet Commonly known as: COREG Take 1 tablet (3.125 mg total) by mouth 2 (two) times daily with a meal.   furosemide 20 MG tablet Commonly known as: LASIX Take 1 tablet (20 mg total) by mouth every other day. What changed: when to take this   pantoprazole 40 MG tablet Commonly known as: PROTONIX Take 1 tablet (40 mg total) by mouth daily. Start taking on: July 18, 2018   sacubitril-valsartan 24-26 MG Commonly known as: ENTRESTO Take 1 tablet by mouth 2 (two) times daily.      No Known Allergies    The results of significant diagnostics from this hospitalization (including imaging, microbiology, ancillary and laboratory) are listed below for reference.    Significant Diagnostic Studies: Dg Chest 2 View  Result Date: 07/13/2018 CLINICAL DATA:  Congestive heart failure. EXAM:  CHEST - 2 VIEW COMPARISON:  Jun 10, 2018 FINDINGS: There is cardiomegaly, similar to prior study. There is generalized volume overload. There are likely small bilateral pleural effusions. There is no pneumothorax. No acute osseous abnormality. There is no displaced fracture. IMPRESSION: Cardiomegaly with findings of congestive heart failure including small bilateral pleural effusions. Electronically Signed   By: [name] M.D.   On: 07/13/2018 19:06    Microbiology: Recent Results (from the past 240 hour(s))  Novel Coronavirus,NAA,(SEND-OUT TO REF LAB - TAT 24-48 hrs); Hosp Order     Status: None   Collection Time: 07/13/18  7:28 PM   Specimen: Nasopharyngeal Swab; Respiratory  Result Value Ref Range Status   SARS-CoV-2, NAA NOT DETECTED NOT DETECTED Final    Comment: (NOTE) This test was developed and its performance characteristics determined by [employer]. This test has not been FDA cleared or approved. This test has been authorized by FDA under an Emergency Use Authorization (EUA). This test is only authorized for the duration of time the declaration that circumstances exist justifying the authorization of the emergency use of in vitro diagnostic tests for detection of SARS-CoV-2 virus and/or diagnosis of COVID-19 infection under section 564(b)(1) of the Act, 21 U.S.C. 924QAS-3(M)(1), unless the authorization is terminated or revoked sooner. When diagnostic testing is negative, the possibility of a false negative result should be considered in the context of a patient's recent exposures and the presence of clinical signs and symptoms consistent with COVID-19. An individual without symptoms of COVID-19 and who is not shedding SARS-CoV-2 virus would expect to have a negative (not detected) result in this assay. Performed  At: [facility] [address] [place], [place] [id number] [name] MD XQ:[id number]    Coronavirus Source NASOPHARYNGEAL  Final     Comment: Performed at [facility], [address], [place], [place] [address]     Labs: Basic Metabolic Panel: Recent Labs  Lab 07/13/18 1709 07/13/18 2223 07/14/18 0416 07/15/18 0800 07/17/18 0600  NA 139  --  141 140 136  K 4.1  --  3.9 3.6 4.0  CL 109  --  111 105 108  CO2 20*  --  19* 23 21*  GLUCOSE 186*  --  102* 170* 125*  BUN 22*  --  22* 27* 23*  CREATININE 1.56* 1.62* 1.52* 1.67* 1.35*  CALCIUM 8.9  --  8.5* 8.8* 8.4*  MG  --  1.7  --   --   --    Liver Function Tests: Recent Labs  Lab 07/13/18 1759 07/14/18 0416 07/17/18 0600  AST 24 18 16   ALT 52* 42 27  ALKPHOS 113 88 93  BILITOT 0.8 1.0 0.6  PROT 6.1* 5.3* 5.6*  ALBUMIN 3.4* 3.0* 3.1*   Recent Labs  Lab 07/13/18 1728  LIPASE 30   No results for input(s): AMMONIA in the last 168 hours. CBC: Recent Labs  Lab 07/13/18 1709 07/13/18 2223  WBC 4.8 4.6  HGB 14.3 13.0  HCT 44.3 39.6  MCV 84.9 82.3  PLT 226 201   Cardiac Enzymes:  Recent Labs  Lab 07/13/18 1709 07/13/18 2015 07/13/18 2223 07/14/18 0416 07/14/18 0958  TROPONINI 0.03* 0.03* 0.03* 0.03* 0.03*   BNP: BNP (last 3 results) Recent Labs    06/10/18 2052 07/13/18 1759  BNP 1,634.1* 4,315.9*    ProBNP (last 3 results) No results for input(s): PROBNP in the last 8760 hours.  CBG: Recent Labs  Lab 07/16/18 0757 07/16/18 1128 07/16/18 1655 07/16/18 2154 07/17/18 0855  GLUCAP 89 156* 132* 163* 118*       Signed:  Rhetta MuraJai-Gurmukh Hiran Leard MD   [facility] 07/17/2018, 1:25 PM

## 2018-07-18 ENCOUNTER — Telehealth: Payer: Self-pay | Admitting: Internal Medicine

## 2018-07-18 ENCOUNTER — Ambulatory Visit: Payer: Medicaid Other | Admitting: Cardiology

## 2018-07-18 DIAGNOSIS — N179 Acute kidney failure, unspecified: Secondary | ICD-10-CM

## 2018-07-18 NOTE — Telephone Encounter (Signed)
Called patient to speak with him about BMET needed prior to appointment. LM with his sister to have him return call. BMET has been ordered. Appointment is scheduled for 08/07/18 with MD

## 2018-07-18 NOTE — Telephone Encounter (Signed)
-----   Message from Pixie Casino, MD sent at 07/16/2018  8:44 AM EDT ----- Regarding: hospital follow-up Will need follow-up app with me or APP with BMET prior to app - admitted with acute kidney injury, has CHF. Schedule in 1-2 weeks - in office.  Thanks,  Dr. Lemmie Evens

## 2018-07-21 ENCOUNTER — Ambulatory Visit: Payer: Medicaid Other | Admitting: Cardiology

## 2018-07-24 NOTE — Telephone Encounter (Signed)
Lab order mailed for patient to complete BMET prior to appointment

## 2018-08-01 ENCOUNTER — Inpatient Hospital Stay (HOSPITAL_COMMUNITY)
Admission: EM | Admit: 2018-08-01 | Discharge: 2018-08-04 | DRG: 292 | Disposition: A | Discharge status: Home / Self Care | Payer: Medicaid Other | Attending: Internal Medicine | Admitting: Internal Medicine

## 2018-08-01 ENCOUNTER — Inpatient Hospital Stay (HOSPITAL_COMMUNITY): Payer: Medicaid Other

## 2018-08-01 ENCOUNTER — Other Ambulatory Visit: Payer: Self-pay

## 2018-08-01 ENCOUNTER — Emergency Department (HOSPITAL_COMMUNITY): Payer: Medicaid Other

## 2018-08-01 ENCOUNTER — Encounter (HOSPITAL_COMMUNITY): Payer: Self-pay | Admitting: Internal Medicine

## 2018-08-01 DIAGNOSIS — Z79899 Other long term (current) drug therapy: Secondary | ICD-10-CM

## 2018-08-01 DIAGNOSIS — E1165 Type 2 diabetes mellitus with hyperglycemia: Secondary | ICD-10-CM | POA: Diagnosis present

## 2018-08-01 DIAGNOSIS — F329 Major depressive disorder, single episode, unspecified: Secondary | ICD-10-CM | POA: Diagnosis present

## 2018-08-01 DIAGNOSIS — Z7982 Long term (current) use of aspirin: Secondary | ICD-10-CM

## 2018-08-01 DIAGNOSIS — Z20828 Contact with and (suspected) exposure to other viral communicable diseases: Secondary | ICD-10-CM | POA: Diagnosis present

## 2018-08-01 DIAGNOSIS — I5023 Acute on chronic systolic (congestive) heart failure: Principal | ICD-10-CM | POA: Diagnosis present

## 2018-08-01 DIAGNOSIS — Z8249 Family history of ischemic heart disease and other diseases of the circulatory system: Secondary | ICD-10-CM | POA: Diagnosis not present

## 2018-08-01 DIAGNOSIS — I4510 Unspecified right bundle-branch block: Secondary | ICD-10-CM | POA: Diagnosis present

## 2018-08-01 DIAGNOSIS — F1990 Other psychoactive substance use, unspecified, uncomplicated: Secondary | ICD-10-CM | POA: Diagnosis present

## 2018-08-01 DIAGNOSIS — N179 Acute kidney failure, unspecified: Secondary | ICD-10-CM | POA: Diagnosis present

## 2018-08-01 DIAGNOSIS — E1122 Type 2 diabetes mellitus with diabetic chronic kidney disease: Secondary | ICD-10-CM | POA: Diagnosis present

## 2018-08-01 DIAGNOSIS — Z9114 Patient's other noncompliance with medication regimen: Secondary | ICD-10-CM | POA: Diagnosis not present

## 2018-08-01 DIAGNOSIS — E119 Type 2 diabetes mellitus without complications: Secondary | ICD-10-CM

## 2018-08-01 DIAGNOSIS — Z7984 Long term (current) use of oral hypoglycemic drugs: Secondary | ICD-10-CM

## 2018-08-01 DIAGNOSIS — I509 Heart failure, unspecified: Secondary | ICD-10-CM | POA: Insufficient documentation

## 2018-08-01 DIAGNOSIS — F17210 Nicotine dependence, cigarettes, uncomplicated: Secondary | ICD-10-CM | POA: Diagnosis present

## 2018-08-01 DIAGNOSIS — N183 Chronic kidney disease, stage 3 (moderate): Secondary | ICD-10-CM | POA: Diagnosis present

## 2018-08-01 DIAGNOSIS — K761 Chronic passive congestion of liver: Secondary | ICD-10-CM | POA: Diagnosis present

## 2018-08-01 DIAGNOSIS — R945 Abnormal results of liver function studies: Secondary | ICD-10-CM | POA: Diagnosis not present

## 2018-08-01 DIAGNOSIS — R17 Unspecified jaundice: Secondary | ICD-10-CM

## 2018-08-01 DIAGNOSIS — R748 Abnormal levels of other serum enzymes: Secondary | ICD-10-CM | POA: Diagnosis not present

## 2018-08-01 DIAGNOSIS — R7989 Other specified abnormal findings of blood chemistry: Secondary | ICD-10-CM | POA: Diagnosis present

## 2018-08-01 DIAGNOSIS — I5043 Acute on chronic combined systolic (congestive) and diastolic (congestive) heart failure: Secondary | ICD-10-CM

## 2018-08-01 LAB — CBC
HCT: 42.9 % (ref 39.0–52.0)
Hemoglobin: 14 g/dL (ref 13.0–17.0)
MCH: 26.8 pg (ref 26.0–34.0)
MCHC: 32.6 g/dL (ref 30.0–36.0)
MCV: 82 fL (ref 80.0–100.0)
Platelets: 190 10*3/uL (ref 150–400)
RBC: 5.23 MIL/uL (ref 4.22–5.81)
RDW: 14.1 % (ref 11.5–15.5)
WBC: 4.8 10*3/uL (ref 4.0–10.5)
nRBC: 0 % (ref 0.0–0.2)

## 2018-08-01 LAB — BASIC METABOLIC PANEL
Anion gap: 11 (ref 5–15)
BUN: 29 mg/dL — ABNORMAL HIGH (ref 6–20)
CO2: 18 mmol/L — ABNORMAL LOW (ref 22–32)
Calcium: 9 mg/dL (ref 8.9–10.3)
Chloride: 107 mmol/L (ref 98–111)
Creatinine, Ser: 1.63 mg/dL — ABNORMAL HIGH (ref 0.61–1.24)
GFR calc Af Amer: 55 mL/min — ABNORMAL LOW (ref >60)
GFR calc non Af Amer: 48 mL/min — ABNORMAL LOW (ref >60)
Glucose, Bld: 231 mg/dL — ABNORMAL HIGH (ref 70–99)
Potassium: 4.2 mmol/L (ref 3.5–5.1)
Sodium: 136 mmol/L (ref 135–145)

## 2018-08-01 LAB — BRAIN NATRIURETIC PEPTIDE: B Natriuretic Peptide: 3419.3 pg/mL — ABNORMAL HIGH (ref 0.0–100.0)

## 2018-08-01 LAB — HEPATIC FUNCTION PANEL
ALT: 114 U/L — ABNORMAL HIGH (ref 0–44)
AST: 44 U/L — ABNORMAL HIGH (ref 15–41)
Albumin: 3.2 g/dL — ABNORMAL LOW (ref 3.5–5.0)
Alkaline Phosphatase: 160 U/L — ABNORMAL HIGH (ref 38–126)
Bilirubin, Direct: 0.4 mg/dL — ABNORMAL HIGH (ref 0.0–0.2)
Indirect Bilirubin: 1.4 mg/dL — ABNORMAL HIGH (ref 0.3–0.9)
Total Bilirubin: 1.8 mg/dL — ABNORMAL HIGH (ref 0.3–1.2)
Total Protein: 6.1 g/dL — ABNORMAL LOW (ref 6.5–8.1)

## 2018-08-01 LAB — GLUCOSE, CAPILLARY: Glucose-Capillary: 255 mg/dL — ABNORMAL HIGH (ref 70–99)

## 2018-08-01 LAB — TROPONIN I (HIGH SENSITIVITY): Troponin I (High Sensitivity): 17 ng/L (ref <18)

## 2018-08-01 LAB — SARS CORONAVIRUS 2 BY RT PCR (HOSPITAL ORDER, PERFORMED IN ~~LOC~~ HOSPITAL LAB): SARS Coronavirus 2: NEGATIVE

## 2018-08-01 MED ORDER — INSULIN ASPART 100 UNIT/ML ~~LOC~~ SOLN
0.0000 [IU] | Freq: Three times a day (TID) | SUBCUTANEOUS | Status: DC
  Administered 2018-08-02: 3 [IU] via SUBCUTANEOUS
  Administered 2018-08-02: 5 [IU] via SUBCUTANEOUS
  Administered 2018-08-02: 2 [IU] via SUBCUTANEOUS
  Administered 2018-08-03: 5 [IU] via SUBCUTANEOUS
  Administered 2018-08-03 (×2): 2 [IU] via SUBCUTANEOUS
  Administered 2018-08-04: 1 [IU] via SUBCUTANEOUS
  Administered 2018-08-04: 5 [IU] via SUBCUTANEOUS

## 2018-08-01 MED ORDER — ASPIRIN EC 81 MG PO TBEC
81.0000 mg | DELAYED_RELEASE_TABLET | Freq: Every day | ORAL | Status: DC
  Administered 2018-08-02 – 2018-08-04 (×3): 81 mg via ORAL
  Filled 2018-08-01 (×4): qty 1

## 2018-08-01 MED ORDER — PANTOPRAZOLE SODIUM 40 MG PO TBEC
40.0000 mg | DELAYED_RELEASE_TABLET | Freq: Every day | ORAL | Status: DC
  Administered 2018-08-02 – 2018-08-04 (×3): 40 mg via ORAL
  Filled 2018-08-01 (×3): qty 1

## 2018-08-01 MED ORDER — ENOXAPARIN SODIUM 40 MG/0.4ML ~~LOC~~ SOLN
40.0000 mg | Freq: Every day | SUBCUTANEOUS | Status: DC
  Administered 2018-08-02 – 2018-08-04 (×3): 40 mg via SUBCUTANEOUS
  Filled 2018-08-01 (×3): qty 0.4

## 2018-08-01 MED ORDER — FUROSEMIDE 10 MG/ML IJ SOLN
40.0000 mg | Freq: Once | INTRAMUSCULAR | Status: AC
  Administered 2018-08-01: 40 mg via INTRAVENOUS
  Filled 2018-08-01: qty 4

## 2018-08-01 MED ORDER — CARVEDILOL 3.125 MG PO TABS
3.1250 mg | ORAL_TABLET | Freq: Two times a day (BID) | ORAL | Status: DC
  Administered 2018-08-02 – 2018-08-04 (×5): 3.125 mg via ORAL
  Filled 2018-08-01 (×5): qty 1

## 2018-08-01 MED ORDER — FUROSEMIDE 10 MG/ML IJ SOLN
40.0000 mg | Freq: Two times a day (BID) | INTRAMUSCULAR | Status: DC
  Administered 2018-08-02: 40 mg via INTRAVENOUS
  Filled 2018-08-01: qty 4

## 2018-08-01 NOTE — H&P (Signed)
History and Physical    Kerry Hamilton VWP:[id number] DOB: 07/26/1965 DOA: 08/01/2018  PCP: Patient, No Pcp Per  Patient coming from: Home.  Chief Complaint: Shortness of breath.  HPI: Kerry Hamilton is a 53 y.o. male with history of chronic systolic heart failure, diabetes mellitus type 2 history of cocaine and alcohol abuse patient states he has not had any of these for last few weeks who was recently admitted in June and discharged on July 17, 2018 for CHF exacerbation presents to the ER with complaint of increasing shortness of breath over the last 2 days.  Patient has been short of breath last 2 days after July 4.  Patient states he has been eating more than usual.  But has been taking Lasix.  Not been taking his Entresto.  Denies chest pain has been chronic cough.  Denies any nausea vomiting abdominal pain though has abdominal distention.  Shortness of breath increased on walking.  Denies fever chills.  ED Course: In the ER chest x-ray shows congestion.  Labs revealed BNP of 3000 high-sensitivity troponin of 17 blood glucose 231 creatinine 1.6 platelets 190 EKG normal sinus rhythm RBBB.  LFTs were elevated with AST of 44 ALT of 114 total bilirubin 1.8.  Patient was given Lasix 40 mg IV and admitted for acute CHF.  Since LFTs were elevated sonogram of abdomen was done which shows which is considered fluid overload including right pleural effusion.  No acute findings in the liver.  Review of Systems: As per HPI, rest all negative.   Past Medical History:  Diagnosis Date  . Depression   . Diabetes mellitus without complication [facility])     Past Surgical History:  Procedure Laterality Date  . RIGHT/LEFT HEART CATH AND CORONARY ANGIOGRAPHY N/A 06/13/2018   Procedure: RIGHT/LEFT HEART CATH AND CORONARY ANGIOGRAPHY;  Surgeon: [place], Peter M, MD;  Location: [facility] INVASIVE CV LAB;  Service: Cardiovascular;  Laterality: N/A;     reports that he has been smoking cigarettes. He has never used smokeless  tobacco. He reports current alcohol use of about 4.0 standard drinks of alcohol per week. He reports that he does not use drugs.  No Known Allergies  Family History  Problem Relation Age of Onset  . CAD Paternal Grandmother   . Diabetes Mellitus II Neg Hx     Prior to Admission medications   Medication Sig Start Date End Date Taking? Authorizing Provider  aspirin EC 81 MG EC tablet Take 1 tablet (81 mg total) by mouth daily. 06/17/18 09/15/18 Yes Dahal, Melina Schools, MD  carvedilol (COREG) 3.125 MG tablet Take 1 tablet (3.125 mg total) by mouth 2 (two) times daily with a meal. 06/16/18 09/14/18 Yes Dahal, Melina Schools, MD  furosemide (LASIX) 20 MG tablet Take 1 tablet (20 mg total) by mouth every other day. 07/17/18 10/15/18 Yes Rhetta Mura, MD  pantoprazole (PROTONIX) 40 MG tablet Take 1 tablet (40 mg total) by mouth daily. 07/18/18  Yes Rhetta Mura, MD  sacubitril-valsartan (ENTRESTO) 24-26 MG Take 1 tablet by mouth 2 (two) times daily. 06/16/18 09/14/18  Lorin Glass, MD    Physical Exam: Constitutional: Moderately built and nourished. Vitals:   08/01/18 1830 08/01/18 1845 08/01/18 1900 08/01/18 2220  BP: 114/85 (!) 116/92 (!) 122/95 (!) 117/95  Pulse: 83 80 83 81  Resp: (!) 33 (!) 25 (!) 27 18  Temp:    (!) 97.2 F (36.2 C)  TempSrc:      SpO2: 98% 99% 97% 96%  Weight:  84.9 kg  Height:    6' (1.829 m)   Eyes: Anicteric no pallor. ENMT: No discharge from the ears eyes nose or mouth. Neck: Elevated JVD no mass felt. Respiratory: No rhonchi or crepitations. Cardiovascular: S1-S2 heard. Abdomen: Soft nontender bowel sounds present primary distended.  No guarding or rigidity. Musculoskeletal: No edema. Skin: No rash. Neurologic: Alert awake oriented to time place and person.  Moves all extremities. Psychiatric: Appears normal per normal affect.   Labs on Admission: I have personally reviewed following labs and imaging studies  CBC: Recent Labs  Lab 08/01/18 1829   WBC 4.8  HGB 14.0  HCT 42.9  MCV 82.0  PLT 190   Basic Metabolic Panel: Recent Labs  Lab 08/01/18 1829  NA 136  K 4.2  CL 107  CO2 18*  GLUCOSE 231*  BUN 29*  CREATININE 1.63*  CALCIUM 9.0   GFR: Estimated Creatinine Clearance: 58.2 mL/min (A) (by C-G formula based on SCr of 1.63 mg/dL (H)). Liver Function Tests: Recent Labs  Lab 08/01/18 1834  AST 44*  ALT 114*  ALKPHOS 160*  BILITOT 1.8*  PROT 6.1*  ALBUMIN 3.2*   No results for input(s): LIPASE, AMYLASE in the last 168 hours. No results for input(s): AMMONIA in the last 168 hours. Coagulation Profile: No results for input(s): INR, PROTIME in the last 168 hours. Cardiac Enzymes: No results for input(s): CKTOTAL, CKMB, CKMBINDEX, TROPONINI in the last 168 hours. BNP (last 3 results) No results for input(s): PROBNP in the last 8760 hours. HbA1C: No results for input(s): HGBA1C in the last 72 hours. CBG: No results for input(s): GLUCAP in the last 168 hours. Lipid Profile: No results for input(s): CHOL, HDL, LDLCALC, TRIG, CHOLHDL, LDLDIRECT in the last 72 hours. Thyroid Function Tests: No results for input(s): TSH, T4TOTAL, FREET4, T3FREE, THYROIDAB in the last 72 hours. Anemia Panel: No results for input(s): VITAMINB12, FOLATE, FERRITIN, TIBC, IRON, RETICCTPCT in the last 72 hours. Urine analysis:    Component Value Date/Time   COLORURINE YELLOW 06/10/2018 1614   APPEARANCEUR CLEAR 06/10/2018 1614   LABSPEC 1.032 (H) 06/10/2018 1614   PHURINE 5.0 06/10/2018 1614   GLUCOSEU NEGATIVE 06/10/2018 1614   HGBUR NEGATIVE 06/10/2018 1614   BILIRUBINUR NEGATIVE 06/10/2018 1614   KETONESUR NEGATIVE 06/10/2018 1614   PROTEINUR 100 (A) 06/10/2018 1614   NITRITE NEGATIVE 06/10/2018 1614   LEUKOCYTESUR NEGATIVE 06/10/2018 1614   Sepsis Labs: @LABRCNTIP (procalcitonin:4,lacticidven:4) ) Recent Results (from the past 240 hour(s))  SARS Coronavirus 2 (CEPHEID- Performed in [facility] lab), Hosp Order      Status: None   Collection Time: 08/01/18  7:53 PM   Specimen: Nasopharyngeal Swab  Result Value Ref Range Status   SARS Coronavirus 2 NEGATIVE NEGATIVE Final    Comment: (NOTE) If result is NEGATIVE SARS-CoV-2 target nucleic acids are NOT DETECTED. The SARS-CoV-2 RNA is generally detectable in upper and lower  respiratory specimens during the acute phase of infection. The lowest  concentration of SARS-CoV-2 viral copies this assay can detect is 250  copies / mL. A negative result does not preclude SARS-CoV-2 infection  and should not be used as the sole basis for treatment or other  patient management decisions.  A negative result may occur with  improper specimen collection / handling, submission of specimen other  than nasopharyngeal swab, presence of viral mutation(s) within the  areas targeted by this assay, and inadequate number of viral copies  (<250 copies / mL). A negative result must be combined  with clinical  observations, patient history, and epidemiological information. If result is POSITIVE SARS-CoV-2 target nucleic acids are DETECTED. The SARS-CoV-2 RNA is generally detectable in upper and lower  respiratory specimens dur ing the acute phase of infection.  Positive  results are indicative of active infection with SARS-CoV-2.  Clinical  correlation with patient history and other diagnostic information is  necessary to determine patient infection status.  Positive results do  not rule out bacterial infection or co-infection with other viruses. If result is PRESUMPTIVE POSTIVE SARS-CoV-2 nucleic acids MAY BE PRESENT.   A presumptive positive result was obtained on the submitted specimen  and confirmed on repeat testing.  While 2019 novel coronavirus  (SARS-CoV-2) nucleic acids may be present in the submitted sample  additional confirmatory testing may be necessary for epidemiological  and / or clinical management purposes  to differentiate between  SARS-CoV-2 and other  Sarbecovirus currently known to infect humans.  If clinically indicated additional testing with an alternate test  methodology [phone]([id number]) is advised. The SARS-CoV-2 RNA is generally  detectable in upper and lower respiratory sp ecimens during the acute  phase of infection. The expected result is Negative. Fact Sheet for Patients:  BoilerBrush.com.cyhttps://www.fda.gov/media/136312/download Fact Sheet for Healthcare Providers: https://pope.com/https://www.fda.gov/media/136313/download This test is not yet approved or cleared by the [place] FDA and has been authorized for detection and/or diagnosis of SARS-CoV-2 by FDA under an Emergency Use Authorization (EUA).  This EUA will remain in effect (meaning this test can be used) for the duration of the COVID-19 declaration under Section 564(b)(1) of the Act, 21 U.S.C. section 360bbb-3(b)(1), unless the authorization is terminated or revoked sooner. Performed at [facility], [address] [place], [place] [address]      Radiological Exams on Admission: Dg Chest Portable 1 View  Result Date: 08/01/2018 CLINICAL DATA:  Shortness of breath.  History of CHF. EXAM: PORTABLE CHEST 1 VIEW COMPARISON:  07/13/2018 FINDINGS: The heart is enlarged but stable. Mild central vascular congestion without overt pulmonary edema. No definite pleural effusions. No infiltrates. The bony thorax is intact. IMPRESSION: Cardiac enlargement and central vascular congestion without overt pulmonary edema or pleural effusions. Electronically Signed   By: Rudie MeyerP.  Gallerani M.D.   On: 08/01/2018 18:39   [place] Abdomen Limited Ruq  Result Date: 08/01/2018 CLINICAL DATA:  Elevated liver enzymes, abdominal pain for 2 days. EXAM: ULTRASOUND ABDOMEN LIMITED RIGHT UPPER QUADRANT COMPARISON:  Abdominal ultrasound 06/10/2018 FINDINGS: Gallbladder: No gallstones or wall thickening visualized. No sonographic Murphy sign noted by sonographer. Common bile duct: Diameter: 3.2 Liver: No focal lesion identified. Within  normal limits in parenchymal echogenicity. Portal vein is patent on color Doppler imaging with normal direction of blood flow towards the liver. Additional: Trace right pleural effusion. Small volume of anechoic free fluid seen adjacent posterior left lobe liver, within Morrison's pouch. IMPRESSION: Right pleural effusion and anechoic free fluid in Morison's pouch. Could correlate with fluid status in the setting of CHF. No convincing sonographic evidence of acute cholecystitis or other acute right upper quadrant abnormality. Electronically Signed   By: MD Kreg ShropshirePrice  DeHay   On: 08/01/2018 21:20    EKG: Independently reviewed.  Normal sinus rhythm RBBB.  Assessment/Plan Active Problems:   Diabetes mellitus type 2, noninsulin dependent (HCC)   LFT elevation   Acute on chronic systolic CHF (congestive heart failure) (HCC)   Acute CHF (congestive heart failure) (HCC)   Elevated liver enzymes    1. Acute on chronic systolic heart failure last EF  measured at 15% in May 2020 -patient has been placed on Lasix 40 mg IV every 12.  If creatinine does not worsen may restart Entresto.  Closely follow intake output metabolic panel daily weights. 2. Diabetes mellitus type 2 with hyperglycemia patient is not on any antidiabetic medications.  Last hemoglobin A1c in May was 6.3.  We will keep patient on sliding scale closely follow CBGs. 3. LFTs are elevated.  Abdomen appears benign.  Sonogram of abdomen does not show anything acute.  Patient states he has not had any alcohol recently or Tylenol.  May be secondary CHF.  Closely observe and follow LFTs. 4. History of drug abuse.  UDS is pending.  Patient denies taking any cocaine recently. 5. Chronic kidney disease stage III creatinine mildly increased from baseline.  Follow metabolic panel.   DVT prophylaxis: Lovenox. Code Status: Full code. Family Communication: Discussed with patient. Disposition Plan: Home. Consults called: None. Admission status: Inpatient.    [name] MD [facility] Pager [phone].  If 7PM-7AM, please contact night-coverage [email or web address] Password [email or web address]  08/01/2018, 11:01 PM

## 2018-08-01 NOTE — ED Triage Notes (Signed)
Per [facility], pt from home with complaint of shob, abd tightness with hx of chf. Here last month for same. Takes lasix.

## 2018-08-01 NOTE — ED Provider Notes (Signed)
[facility] EMERGENCY DEPARTMENT Provider Note   CSN: [id number] Arrival date & time: 08/01/18  1808     History   Chief Complaint Chief Complaint  Patient presents with  . Shortness of Breath  . Congestive Heart Failure    HPI Kerry Hamilton is a 53 y.o. male.     HPI  Patient is a 53 year old male with past medical history of diabetes mellitus, acute CHF, EF of 15% per June 2020, polysubstance use presenting for shortness of breath.  Patient reports that he has had increasing shortness of breath for the past 48 hours.  He reports a nonproductive cough.  Denies fevers or chills.  Denies chest pain.  Denies abdominal pain, nausea, vomiting.  He denies any lower extremity edema or calf tenderness.  Patient does report taking his medications that he was discharged home with on 07/17/2018.  He does report that he had some salty and fatty foods over the July 4 weekend.  Denies any cocaine use in a month and denies any tobacco use in 2 weeks.  Past Medical History:  Diagnosis Date  . Depression   . Diabetes mellitus without complication [facility])     Patient Active Problem List   Diagnosis Date Noted  . Acute on chronic systolic CHF (congestive heart failure) ([place]) 07/13/2018  . ARF (acute renal failure) ([place]) 07/13/2018  . Acute systolic CHF (congestive heart failure) ([place])   . Elevated troponin   . Shortness of breath   . LFT elevation   . Prolonged QT interval   . Polysubstance abuse ([place])   . ACS (acute coronary syndrome) ([place]) 06/10/2018  . Diabetes mellitus type 2, noninsulin dependent ([place]) 06/10/2018  . Depression 06/10/2018    Past Surgical History:  Procedure Laterality Date  . RIGHT/LEFT HEART CATH AND CORONARY ANGIOGRAPHY N/A 06/13/2018   Procedure: RIGHT/LEFT HEART CATH AND CORONARY ANGIOGRAPHY;  Surgeon: Martinique, Peter M, MD;  Location: [facility] CV LAB;  Service: Cardiovascular;  Laterality: N/A;        Home Medications    Prior to Admission  medications   Medication Sig Start Date End Date Taking? Authorizing Provider  aspirin EC 81 MG EC tablet Take 1 tablet (81 mg total) by mouth daily. 06/17/18 09/15/18  Terrilee Croak, MD  carvedilol (COREG) 3.125 MG tablet Take 1 tablet (3.125 mg total) by mouth 2 (two) times daily with a meal. 06/16/18 09/14/18  Dahal, Marlowe Aschoff, MD  furosemide (LASIX) 20 MG tablet Take 1 tablet (20 mg total) by mouth every other day. 07/17/18 10/15/18  Nita Sells, MD  pantoprazole (PROTONIX) 40 MG tablet Take 1 tablet (40 mg total) by mouth daily. 07/18/18   Nita Sells, MD  sacubitril-valsartan (ENTRESTO) 24-26 MG Take 1 tablet by mouth 2 (two) times daily. 06/16/18 09/14/18  Terrilee Croak, MD    Family History Family History  Family history unknown: Yes    Social History Social History   Tobacco Use  . Smoking status: Current Some Day Smoker    Types: Cigarettes  . Smokeless tobacco: Never Used  Substance Use Topics  . Alcohol use: Yes    Alcohol/week: 4.0 standard drinks    Types: 4 Cans of beer per week  . Drug use: No     Allergies   Patient has no known allergies.   Review of Systems Review of Systems  Constitutional: Negative for chills and fever.  HENT: Negative for congestion, rhinorrhea, sinus pain and sore throat.   Eyes: Negative for visual disturbance.  Respiratory: Positive for cough and shortness of breath. Negative for chest tightness.   Cardiovascular: Negative for chest pain, palpitations and leg swelling.  Gastrointestinal: Negative for abdominal pain, nausea and vomiting.  Genitourinary: Negative for dysuria and flank pain.  Musculoskeletal: Negative for back pain and myalgias.  Skin: Negative for rash.  Neurological: Negative for dizziness, syncope, light-headedness and headaches.     Physical Exam Updated Vital Signs BP 112/87 (BP Location: Right Arm)   Pulse 85   Temp 98.6 F (37 C) (Oral)   Resp 19   SpO2 99%   Physical Exam Vitals signs and  nursing note reviewed.  Constitutional:      General: He is not in acute distress.    Appearance: He is well-developed.  HENT:     Head: Normocephalic and atraumatic.  Eyes:     Conjunctiva/sclera: Conjunctivae normal.     Pupils: Pupils are equal, round, and reactive to light.  Neck:     Musculoskeletal: Normal range of motion and neck supple.  Cardiovascular:     Rate and Rhythm: Normal rate and regular rhythm.     Heart sounds: S1 normal and S2 normal. No murmur.  Pulmonary:     Effort: Pulmonary effort is normal.     Breath sounds: Examination of the right-lower field reveals decreased breath sounds and rales. Examination of the left-lower field reveals decreased breath sounds and rales. Decreased breath sounds and rales present. No wheezing.  Abdominal:     General: There is no distension.     Palpations: Abdomen is soft.     Tenderness: There is no abdominal tenderness. There is no guarding.  Musculoskeletal: Normal range of motion.        General: No deformity.     Right lower leg: No edema.     Left lower leg: No edema.     Comments: No calf tenderness.   Lymphadenopathy:     Cervical: No cervical adenopathy.  Skin:    General: Skin is warm and dry.     Findings: No erythema or rash.  Neurological:     Mental Status: He is alert.     Comments: Cranial nerves grossly intact. Patient moves extremities symmetrically and with good coordination.  Psychiatric:        Behavior: Behavior normal.        Thought Content: Thought content normal.        Judgment: Judgment normal.      ED Treatments / Results  Labs (all labs ordered are listed, but only abnormal results are displayed) Labs Reviewed  SARS CORONAVIRUS 2 (HOSPITAL ORDER, PERFORMED IN [facility] LAB)  CBC  BASIC METABOLIC PANEL  TROPONIN I (HIGH SENSITIVITY)  BRAIN NATRIURETIC PEPTIDE  HEPATIC FUNCTION PANEL    EKG None  Radiology Dg Chest Portable 1 View  Result Date: 08/01/2018 CLINICAL  DATA:  Shortness of breath.  History of CHF. EXAM: PORTABLE CHEST 1 VIEW COMPARISON:  07/13/2018 FINDINGS: The heart is enlarged but stable. Mild central vascular congestion without overt pulmonary edema. No definite pleural effusions. No infiltrates. The bony thorax is intact. IMPRESSION: Cardiac enlargement and central vascular congestion without overt pulmonary edema or pleural effusions. Electronically Signed   By: Rudie Meyer M.D.   On: 08/01/2018 18:39    Procedures Procedures (including critical care time)  Medications Ordered in ED Medications - No data to display   Initial Impression / Assessment and Plan / ED Course  I have reviewed the triage vital signs  and the nursing notes.  Pertinent labs & imaging results that were available during my care of the patient were reviewed by me and considered in my medical decision making (see chart for details).  Clinical Course as of Aug 01 2039  Tue Aug 01, 2018  2021 Spoke with Dr. Toniann FailKakrakandy who will admit pt. Appreciate his involvement.    [AM]    Clinical Course User Index [AM] Elisha PonderMurray,  B, PA-C       Differential diagnosis includes HF exacerbation, reactive airway disease, COPD exacerbation, pneumonia, PE, ACS, cardiac tamponade, retropharyngeal abscess, neuromuscular causes (AIDP, myasthenia gravis, wound botulism, anemia, toxicologic (CO toxicity, MetHb, ASA overdose), metabolic acidosis.   Work-up demonstrating slightly worsening creatinine.  See below for trend.  Glucose is 231.  Patient's hepatic function panel has multiple abnormalities.  Patient has elevation in all transaminases, alkaline phosphatase, direct and indirect bilirubin.  Troponin is negative.  EKG in normal sinus rhythm with right bundle branch block consistent with prior EKG.  Chest x-ray with market cardiomegaly, but no overt pulmonary vascular congestion.  Given patient's symptoms, requirement of oxygen by EMS, although no documented hypoxia here, as  well as transaminitis, do have concerns for hepatic congestion given his symptoms of abdominal tightness.  Will seek hospital medicine admission.  Final Clinical Impressions(s) / ED Diagnoses   Final diagnoses:  Acute on chronic combined systolic and diastolic congestive heart failure (HCC)  Elevated liver enzymes  Elevated bilirubin    ED Discharge Orders    None       Delia ChimesMurray,  B, PA-C 08/01/18 2042    Wynetta FinesMessick, Peter C, MD 08/06/18 1109

## 2018-08-01 NOTE — ED Notes (Signed)
Attempted to call report, nurse unavailable.

## 2018-08-02 DIAGNOSIS — R945 Abnormal results of liver function studies: Secondary | ICD-10-CM

## 2018-08-02 LAB — CBC WITH DIFFERENTIAL/PLATELET
Abs Immature Granulocytes: 0.02 10*3/uL (ref 0.00–0.07)
Basophils Absolute: 0.1 10*3/uL (ref 0.0–0.1)
Basophils Relative: 1 %
Eosinophils Absolute: 0.1 10*3/uL (ref 0.0–0.5)
Eosinophils Relative: 1 %
HCT: 41.4 % (ref 39.0–52.0)
Hemoglobin: 13.9 g/dL (ref 13.0–17.0)
Immature Granulocytes: 0 %
Lymphocytes Relative: 43 %
Lymphs Abs: 2.2 10*3/uL (ref 0.7–4.0)
MCH: 27.3 pg (ref 26.0–34.0)
MCHC: 33.6 g/dL (ref 30.0–36.0)
MCV: 81.3 fL (ref 80.0–100.0)
Monocytes Absolute: 0.5 10*3/uL (ref 0.1–1.0)
Monocytes Relative: 10 %
Neutro Abs: 2.3 10*3/uL (ref 1.7–7.7)
Neutrophils Relative %: 45 %
Platelets: 179 10*3/uL (ref 150–400)
RBC: 5.09 MIL/uL (ref 4.22–5.81)
RDW: 13.9 % (ref 11.5–15.5)
WBC: 5 10*3/uL (ref 4.0–10.5)
nRBC: 0 % (ref 0.0–0.2)

## 2018-08-02 LAB — CBC
HCT: 40.3 % (ref 39.0–52.0)
Hemoglobin: 13.3 g/dL (ref 13.0–17.0)
MCH: 26.8 pg (ref 26.0–34.0)
MCHC: 33 g/dL (ref 30.0–36.0)
MCV: 81.1 fL (ref 80.0–100.0)
Platelets: 182 10*3/uL (ref 150–400)
RBC: 4.97 MIL/uL (ref 4.22–5.81)
RDW: 13.9 % (ref 11.5–15.5)
WBC: 4.9 10*3/uL (ref 4.0–10.5)
nRBC: 0 % (ref 0.0–0.2)

## 2018-08-02 LAB — BASIC METABOLIC PANEL
Anion gap: 10 (ref 5–15)
BUN: 30 mg/dL — ABNORMAL HIGH (ref 6–20)
CO2: 22 mmol/L (ref 22–32)
Calcium: 8.6 mg/dL — ABNORMAL LOW (ref 8.9–10.3)
Chloride: 103 mmol/L (ref 98–111)
Creatinine, Ser: 1.72 mg/dL — ABNORMAL HIGH (ref 0.61–1.24)
GFR calc Af Amer: 52 mL/min — ABNORMAL LOW (ref >60)
GFR calc non Af Amer: 45 mL/min — ABNORMAL LOW (ref >60)
Glucose, Bld: 308 mg/dL — ABNORMAL HIGH (ref 70–99)
Potassium: 3.9 mmol/L (ref 3.5–5.1)
Sodium: 135 mmol/L (ref 135–145)

## 2018-08-02 LAB — ACETAMINOPHEN LEVEL: Acetaminophen (Tylenol), Serum: <10 ug/mL — ABNORMAL LOW (ref 10–30)

## 2018-08-02 LAB — CREATININE, SERUM
Creatinine, Ser: 1.72 mg/dL — ABNORMAL HIGH (ref 0.61–1.24)
GFR calc Af Amer: 52 mL/min — ABNORMAL LOW (ref >60)
GFR calc non Af Amer: 45 mL/min — ABNORMAL LOW (ref >60)

## 2018-08-02 LAB — RAPID URINE DRUG SCREEN, HOSP PERFORMED
Amphetamines: NOT DETECTED
Barbiturates: NOT DETECTED
Benzodiazepines: NOT DETECTED
Cocaine: NOT DETECTED
Opiates: NOT DETECTED
Tetrahydrocannabinol: NOT DETECTED

## 2018-08-02 LAB — HEPATIC FUNCTION PANEL
ALT: 104 U/L — ABNORMAL HIGH (ref 0–44)
AST: 37 U/L (ref 15–41)
Albumin: 3 g/dL — ABNORMAL LOW (ref 3.5–5.0)
Alkaline Phosphatase: 151 U/L — ABNORMAL HIGH (ref 38–126)
Bilirubin, Direct: 0.4 mg/dL — ABNORMAL HIGH (ref 0.0–0.2)
Indirect Bilirubin: 0.8 mg/dL (ref 0.3–0.9)
Total Bilirubin: 1.2 mg/dL (ref 0.3–1.2)
Total Protein: 6.1 g/dL — ABNORMAL LOW (ref 6.5–8.1)

## 2018-08-02 LAB — GLUCOSE, CAPILLARY
Glucose-Capillary: 213 mg/dL — ABNORMAL HIGH (ref 70–99)
Glucose-Capillary: 268 mg/dL — ABNORMAL HIGH (ref 70–99)
Glucose-Capillary: 157 mg/dL — ABNORMAL HIGH (ref 70–99)
Glucose-Capillary: 207 mg/dL — ABNORMAL HIGH (ref 70–99)

## 2018-08-02 LAB — TROPONIN I (HIGH SENSITIVITY)
Troponin I (High Sensitivity): 16 ng/L (ref <18)
Troponin I (High Sensitivity): 17 ng/L (ref <18)

## 2018-08-02 LAB — MAGNESIUM: Magnesium: 1.8 mg/dL (ref 1.7–2.4)

## 2018-08-02 MED ORDER — FUROSEMIDE 10 MG/ML IJ SOLN
60.0000 mg | Freq: Two times a day (BID) | INTRAMUSCULAR | Status: DC
  Administered 2018-08-02 – 2018-08-03 (×3): 60 mg via INTRAVENOUS
  Filled 2018-08-02 (×4): qty 6

## 2018-08-02 NOTE — Progress Notes (Signed)
PROGRESS NOTE    Kerry Hamilton  VEH:[id number] DOB: [birth year] DOA: 08/01/2018 PCP: Patient, No Pcp Per    Brief Narrative:  53 year old male who presented with dyspnea.  He does have significant past medical history for systolic heart failure, type 2 diabetes mellitus, cocaine and alcohol abuse.  He reported 48 hours of worsening dyspnea, he has been noncompliant with his medical or dietary regimen.  On his initial physical examination blood pressure 114/85, heart rate 80, respiratory rate 33, oxygen saturation 97% his lungs had no rales or rhonchi, heart S1-S2 present rhythmic, abdomen soft, no lower extremity edema.  Chest radiograph with significant cardiomegaly.  EKG 86 bpm, right axis deviation, sinus rhythm with a right bundle branch block, no significant ST segment or T wave changes.  Patient was admitted to the hospital working diagnosis acute on chronic systolic heart failure exacerbation.   Assessment & Plan:   Principal Problem:   Acute on chronic systolic CHF (congestive heart failure) (HCC) Active Problems:   Diabetes mellitus type 2, noninsulin dependent (HCC)   LFT elevation   Elevated liver enzymes   1. Acute decompensated systolic heart failure. Patient continue to be volume overload with positive JVD and rales on lung examination, his documented urine output is 400 cc. Will increase diuresis with furosemide 60 mg IV q12, to target a negative fluid balance. Continue low dose carvedilol. Patient at home on entresto.   2. AKI on CKD stage 3a. Renal function this am with serum cr at 1,72, with K at 3,9 and serum bicarbonate at 22. Clinically continue to be hypervolemic, will continue aggressive diuresis with furosemide. Follow on renal panel in am. Avoid hypotension and nephrotoxic medications.   3. T2DM. Uncontrolled hyperglycemia, fasting glucose this am at 308, will continue glucose cover and monitoring with insulin sliding scale, patient is tolerating po well. Will calculate  requirements before basal insulin regimen.   4, High LFT. Will continue supportive therapy.   DVT prophylaxis: heparin   Code Status: full  Family Communication: no family at the bedside   Disposition Plan/ discharge barriers: pending clinical improvement.   Body mass index is 25.12 kg/m. Malnutrition Type:      Malnutrition Characteristics:      Nutrition Interventions:     RN Pressure Injury Documentation:     Consultants:     Procedures:     Antimicrobials:       Subjective: Patient continue to have dyspnea and generalized weakness, no chest pain, no nausea or vomiting.   Objective: Vitals:   08/01/18 2220 08/02/18 0500 08/02/18 0743 08/02/18 1149  BP: (!) 117/95 (!) 115/94 113/87 113/83  Pulse: 81 88 80 72  Resp: 18 18 17 18   Temp: (!) 97.2 F (36.2 C) 97.8 F (36.6 C) (!) 97.5 F (36.4 C) 98.5 F (36.9 C)  TempSrc:  Oral Oral Oral  SpO2: 96% 98% 100% 100%  Weight: 84.9 kg 84 kg    Height: 6' (1.829 m)       Intake/Output Summary (Last 24 hours) at 08/02/2018 1353 Last data filed at 08/02/2018 1200 Gross per 24 hour  Intake 1260 ml  Output 950 ml  Net 310 ml   Filed Weights   08/01/18 2220 08/02/18 0500  Weight: 84.9 kg 84 kg    Examination:   General: deconditioned and ill looking appearing  Neurology: Awake and alert, non focal  E ENT: mild pallor, no icterus, oral mucosa moist Cardiovascular:  Positive JVD. S1-S2 present, rhythmic, no gallops, rubs, or  murmurs. Trace lower extremity edema. Pulmonary: positive breath sounds bilaterally, adequate air movement, no wheezing or rhonchi, positive rales. Gastrointestinal. Abdomen with no organomegaly, non tender, no rebound or guarding Skin. No rashes Musculoskeletal: no joint deformities     Data Reviewed: I have personally reviewed following labs and imaging studies  CBC: Recent Labs  Lab 08/01/18 1829 08/01/18 2339 08/02/18 0243  WBC 4.8 4.9 5.0  NEUTROABS  --   --  2.3   HGB 14.0 13.3 13.9  HCT 42.9 40.3 41.4  MCV 82.0 81.1 81.3  PLT 190 182 725   Basic Metabolic Panel: Recent Labs  Lab 08/01/18 1829 08/01/18 2339 08/02/18 0243  NA 136  --  135  K 4.2  --  3.9  CL 107  --  103  CO2 18*  --  22  GLUCOSE 231*  --  308*  BUN 29*  --  30*  CREATININE 1.63* 1.72* 1.72*  CALCIUM 9.0  --  8.6*  MG  --   --  1.8   GFR: Estimated Creatinine Clearance: 55.1 mL/min (A) (by C-G formula based on SCr of 1.72 mg/dL (H)). Liver Function Tests: Recent Labs  Lab 08/01/18 1834 08/02/18 0243  AST 44* 37  ALT 114* 104*  ALKPHOS 160* 151*  BILITOT 1.8* 1.2  PROT 6.1* 6.1*  ALBUMIN 3.2* 3.0*   No results for input(s): LIPASE, AMYLASE in the last 168 hours. No results for input(s): AMMONIA in the last 168 hours. Coagulation Profile: No results for input(s): INR, PROTIME in the last 168 hours. Cardiac Enzymes: No results for input(s): CKTOTAL, CKMB, CKMBINDEX, TROPONINI in the last 168 hours. BNP (last 3 results) No results for input(s): PROBNP in the last 8760 hours. HbA1C: No results for input(s): HGBA1C in the last 72 hours. CBG: Recent Labs  Lab 08/01/18 2351 08/02/18 0558 08/02/18 1147  GLUCAP 255* 213* 268*   Lipid Profile: No results for input(s): CHOL, HDL, LDLCALC, TRIG, CHOLHDL, LDLDIRECT in the last 72 hours. Thyroid Function Tests: No results for input(s): TSH, T4TOTAL, FREET4, T3FREE, THYROIDAB in the last 72 hours. Anemia Panel: No results for input(s): VITAMINB12, FOLATE, FERRITIN, TIBC, IRON, RETICCTPCT in the last 72 hours.    Radiology Studies: I have reviewed all of the imaging during this hospital visit personally     Scheduled Meds: . aspirin EC  81 mg Oral Daily  . carvedilol  3.125 mg Oral BID WC  . enoxaparin (LOVENOX) injection  40 mg Subcutaneous Daily  . furosemide  40 mg Intravenous BID  . insulin aspart  0-9 Units Subcutaneous TID WC  . pantoprazole  40 mg Oral Daily   Continuous Infusions:   LOS: 1  day         Gerome Apley, MD

## 2018-08-02 NOTE — Plan of Care (Signed)
°  Problem: Coping: °Goal: Level of anxiety will decrease °Outcome: Progressing °  °

## 2018-08-02 NOTE — Progress Notes (Signed)
Called Lannette Donath Koons sister back and advised he was feeling better and eating breakfast, transferred call to pt room

## 2018-08-02 NOTE — TOC Initial Note (Signed)
Transition of Care [facility]) - Initial/Assessment Note    Patient Details  Name: Kerry Hamilton MRN: [id number] Date of Birth: 04-28-1965  Transition of Care [facility] CM/SW Contact:    Sharin Mons, RN Phone Number: 08/02/2018, 6:59 PM  Clinical Narrative:       Redmitted with acute on chronic heart failure. Hx of   systolic heart failure, type 2 diabetes mellitus, cocaine and alcohol abuse. Pt last admit noted 6/18- 6/22, CHF. Pt states lives inn rooming house with 3 guys. States independent with ADL's, no DME usage. States has been educated on heart failure and knows the do's and don'ts. States recently ate "salty pork" which caused his SOB. States owns a scale , but doesn't weigh daily. Hasn't seen PCP x 2 years .... Hospital f/u scheduled with [facility] , noted on AVS.  Pt has a prearranged appointment with Dr. Debara Pickett 08/07/2018 which is also noted on AVS.  With last hospitalization pt was setup with [facility]. NCM to f/u with agency to see  If pt still active.  Pt states has transportation issues getting to MD appointments. States Pricilla( sister) supportive but not always available to assist.  Zac Torti (Sister)     [phone]       NCM following for TOC needs .Marland Kitchen...  Expected Discharge Plan: Home/Self Care(Lives in rooming house with 3 other guys) Barriers to Discharge: Continued Medical Work up   Patient Goals and CMS Choice Patient states their goals for this hospitalization and ongoing recovery are:: Leave salty meat alone CMS Medicare.gov Compare Post Acute Care list provided to:: Patient Choice offered to / list presented to : Patient  Expected Discharge Plan and Services Expected Discharge Plan: Home/Self Care(Lives in rooming house with 3 other guys)     Post Acute Care Choice: NA Living arrangements for the past 2 months: Single Family Home Expected Discharge Date: 08/01/18                                    Prior Living  Arrangements/Services Living arrangements for the past 2 months: Single Family Home Lives with:: Other (Comment)(3 roommates) Patient language and need for interpreter reviewed:: Yes Do you feel safe going back to the place where you live?: Yes      Need for Family Participation in Patient Care: No (Comment) Care giver support system in place?: No (comment)   Criminal Activity/Legal Involvement Pertinent to Current Situation/Hospitalization: No - Comment as needed  Activities of Daily Living Home Assistive Devices/Equipment: None ADL Screening (condition at time of admission) Patient's cognitive ability adequate to safely complete daily activities?: Yes Is the patient deaf or have difficulty hearing?: No Does the patient have difficulty seeing, even when wearing glasses/contacts?: No Does the patient have difficulty concentrating, remembering, or making decisions?: No Patient able to express need for assistance with ADLs?: Yes Does the patient have difficulty dressing or bathing?: No Independently performs ADLs?: Yes (appropriate for developmental age) Does the patient have difficulty walking or climbing stairs?: No Weakness of Legs: None Weakness of Arms/Hands: None  Permission Sought/Granted Permission sought to share information with : Case Manager, Family Supports Permission granted to share information with : Yes, Verbal Permission Granted  Share Information with NAME: Roniel Halloran [phone]     Permission granted to share info w Relationship: sister  Permission granted to share info w Contact Information: [phone]  Emotional Assessment Appearance:: Appears stated age Attitude/Demeanor/Rapport:  Engaged Affect (typically observed): Pleasant Orientation: : Oriented to Self, Oriented to Place, Oriented to  Time, Oriented to Situation Alcohol / Substance Use: Tobacco Use, Alcohol Use Psych Involvement: No (comment)  Admission diagnosis:  Elevated liver enzymes  [R74.8] Acute on chronic combined systolic and diastolic congestive heart failure (HCC) [I50.43] Elevated bilirubin [R17] Patient Active Problem List   Diagnosis Date Noted  . Acute CHF (congestive heart failure) (HCC) 08/01/2018  . Elevated liver enzymes 08/01/2018  . Acute on chronic systolic CHF (congestive heart failure) (HCC) 07/13/2018  . ARF (acute renal failure) (HCC) 07/13/2018  . Acute systolic CHF (congestive heart failure) (HCC)   . Elevated troponin   . Shortness of breath   . LFT elevation   . Prolonged QT interval   . Polysubstance abuse (HCC)   . ACS (acute coronary syndrome) (HCC) 06/10/2018  . Diabetes mellitus type 2, noninsulin dependent (HCC) 06/10/2018  . Depression 06/10/2018   PCP:  Patient, No Pcp Per Pharmacy:   [facility] - [place], [place] - [address] [place] [address] Phone: [phone] Fax: [phone]     Social Determinants of Health (SDOH) Interventions    Readmission Risk Interventions No flowsheet data found.

## 2018-08-02 NOTE — Plan of Care (Signed)
  Problem: Activity: Goal: Capacity to carry out activities will improve Outcome: Progressing   Problem: Clinical Measurements: Goal: Will remain free from infection Outcome: Progressing Goal: Diagnostic test results will improve Outcome: Progressing Goal: Respiratory complications will improve Outcome: Progressing   Problem: Activity: Goal: Risk for activity intolerance will decrease Outcome: Progressing   Problem: Nutrition: Goal: Adequate nutrition will be maintained Outcome: Progressing   Problem: Pain Managment: Goal: General experience of comfort will improve Outcome: Progressing   Problem: Safety: Goal: Ability to remain free from injury will improve Outcome: Progressing   Problem: Activity: Goal: Capacity to carry out activities will improve Outcome: Progressing   Problem: Cardiac: Goal: Ability to achieve and maintain adequate cardiopulmonary perfusion will improve Outcome: Progressing

## 2018-08-02 NOTE — Progress Notes (Signed)
Inpatient Diabetes Program Recommendations  AACE/ADA: New Consensus Statement on Inpatient Glycemic Control (2015)  Target Ranges:  Prepandial:   less than 140 mg/dL      Peak postprandial:   less than 180 mg/dL (1-2 hours)      Critically ill patients:  140 - 180 mg/dL   Lab Results  Component Value Date   GLUCAP 268 (H) 08/02/2018   HGBA1C 6.3 (H) 06/11/2018    Review of Glycemic Control Results for Kerry Hamilton, Kerry Hamilton (MRN [id number]) as of 08/02/2018 12:33  Ref. Range 08/01/2018 23:51 08/02/2018 05:58 08/02/2018 11:47  Glucose-Capillary Latest Ref Range: 70 - 99 mg/dL 255 (H) 213 (H) 268 (H)   Diabetes history: Type 2 DM Outpatient Diabetes medications: none Current orders for Inpatient glycemic control: Novolog 0-9 units TID  Inpatient Diabetes Program Recommendations:    Noted A1C and current glucose trends exceeding 180 mg/dL. While inpatient consider adding Levemir 10 units QD.   Thanks, Bronson Curb, MSN, RNC-OB Diabetes Coordinator [phone] (8a-5p)

## 2018-08-03 DIAGNOSIS — N183 Chronic kidney disease, stage 3 (moderate): Secondary | ICD-10-CM

## 2018-08-03 DIAGNOSIS — N179 Acute kidney failure, unspecified: Secondary | ICD-10-CM

## 2018-08-03 LAB — GLUCOSE, CAPILLARY
Glucose-Capillary: 181 mg/dL — ABNORMAL HIGH (ref 70–99)
Glucose-Capillary: 253 mg/dL — ABNORMAL HIGH (ref 70–99)
Glucose-Capillary: 156 mg/dL — ABNORMAL HIGH (ref 70–99)
Glucose-Capillary: 163 mg/dL — ABNORMAL HIGH (ref 70–99)

## 2018-08-03 LAB — BASIC METABOLIC PANEL
Anion gap: 11 (ref 5–15)
BUN: 23 mg/dL — ABNORMAL HIGH (ref 6–20)
CO2: 25 mmol/L (ref 22–32)
Calcium: 8.5 mg/dL — ABNORMAL LOW (ref 8.9–10.3)
Chloride: 99 mmol/L (ref 98–111)
Creatinine, Ser: 1.41 mg/dL — ABNORMAL HIGH (ref 0.61–1.24)
GFR calc Af Amer: >60 mL/min (ref >60)
GFR calc non Af Amer: 57 mL/min — ABNORMAL LOW (ref >60)
Glucose, Bld: 195 mg/dL — ABNORMAL HIGH (ref 70–99)
Potassium: 3.5 mmol/L (ref 3.5–5.1)
Sodium: 135 mmol/L (ref 135–145)

## 2018-08-03 LAB — HEPATITIS PANEL, ACUTE
HCV Ab: <0.1 s/co ratio (ref 0.0–0.9)
Hep A IgM: NEGATIVE
Hep B C IgM: NEGATIVE
Hepatitis B Surface Ag: NEGATIVE

## 2018-08-03 NOTE — Plan of Care (Signed)
  Problem: Education: Goal: Ability to demonstrate management of disease process will improve Outcome: Progressing Goal: Ability to verbalize understanding of medication therapies will improve Outcome: Progressing Goal: Individualized Educational Video(s) Outcome: Progressing   Problem: Activity: Goal: Capacity to carry out activities will improve Outcome: Progressing   Problem: Cardiac: Goal: Ability to achieve and maintain adequate cardiopulmonary perfusion will improve Outcome: Progressing   Problem: Education: Goal: Knowledge of General Education information will improve Description: Including pain rating scale, medication(s)/side effects and non-pharmacologic comfort measures Outcome: Progressing   Problem: Health Behavior/Discharge Planning: Goal: Ability to manage health-related needs will improve Outcome: Progressing   Problem: Clinical Measurements: Goal: Ability to maintain clinical measurements within normal limits will improve Outcome: Progressing Goal: Will remain free from infection Outcome: Progressing Goal: Diagnostic test results will improve Outcome: Progressing Goal: Respiratory complications will improve Outcome: Progressing Goal: Cardiovascular complication will be avoided Outcome: Progressing   Problem: Activity: Goal: Risk for activity intolerance will decrease Outcome: Progressing   Problem: Nutrition: Goal: Adequate nutrition will be maintained Outcome: Progressing   Problem: Coping: Goal: Level of anxiety will decrease Outcome: Progressing   Problem: Elimination: Goal: Will not experience complications related to bowel motility Outcome: Progressing Goal: Will not experience complications related to urinary retention Outcome: Progressing   Problem: Pain Managment: Goal: General experience of comfort will improve Outcome: Progressing   Problem: Safety: Goal: Ability to remain free from injury will improve Outcome: Progressing    Problem: Skin Integrity: Goal: Risk for impaired skin integrity will decrease Outcome: Progressing   Problem: Education: Goal: Ability to demonstrate management of disease process will improve Outcome: Progressing Goal: Ability to verbalize understanding of medication therapies will improve Outcome: Progressing Goal: Individualized Educational Video(s) Outcome: Progressing   Problem: Activity: Goal: Capacity to carry out activities will improve Outcome: Progressing   Problem: Cardiac: Goal: Ability to achieve and maintain adequate cardiopulmonary perfusion will improve Outcome: Progressing   Problem: Education: Goal: Knowledge of General Education information will improve Description: Including pain rating scale, medication(s)/side effects and non-pharmacologic comfort measures Outcome: Progressing   Problem: Health Behavior/Discharge Planning: Goal: Ability to manage health-related needs will improve Outcome: Progressing   Problem: Clinical Measurements: Goal: Ability to maintain clinical measurements within normal limits will improve Outcome: Progressing Goal: Will remain free from infection Outcome: Progressing Goal: Diagnostic test results will improve Outcome: Progressing Goal: Respiratory complications will improve Outcome: Progressing Goal: Cardiovascular complication will be avoided Outcome: Progressing   Problem: Activity: Goal: Risk for activity intolerance will decrease Outcome: Progressing   Problem: Nutrition: Goal: Adequate nutrition will be maintained Outcome: Progressing   Problem: Coping: Goal: Level of anxiety will decrease Outcome: Progressing   Problem: Elimination: Goal: Will not experience complications related to bowel motility Outcome: Progressing Goal: Will not experience complications related to urinary retention Outcome: Progressing   Problem: Pain Managment: Goal: General experience of comfort will improve Outcome:  Progressing   Problem: Safety: Goal: Ability to remain free from injury will improve Outcome: Progressing   Problem: Skin Integrity: Goal: Risk for impaired skin integrity will decrease Outcome: Progressing   

## 2018-08-03 NOTE — Progress Notes (Signed)
PROGRESS NOTE    Kerry Hamilton  PPJ:[id number] DOB: January 21, 1966 DOA: 08/01/2018 PCP: Patient, No Pcp Per    Brief Narrative:  53 year old male who presented with dyspnea.  He does have significant past medical history for systolic heart failure, type 2 diabetes mellitus, cocaine and alcohol abuse.  He reported 48 hours of worsening dyspnea, he has been noncompliant with his medical or dietary regimen.  On his initial physical examination blood pressure 114/85, heart rate 80, respiratory rate 33, oxygen saturation 97% his lungs had no rales or rhonchi, heart S1-S2 present rhythmic, abdomen soft, no lower extremity edema.  Chest radiograph with significant cardiomegaly.  EKG 86 bpm, right axis deviation, sinus rhythm with a right bundle branch block, no significant ST segment or T wave changes.  Patient was admitted to the hospital working diagnosis acute on chronic systolic heart failure exacerbation.    Assessment & Plan:   Principal Problem:   Acute on chronic systolic CHF (congestive heart failure) (HCC) Active Problems:   Diabetes mellitus type 2, noninsulin dependent (HCC)   LFT elevation   Elevated liver enzymes   1. Acute decompensated systolic heart failure/ LV EF 15% with severely dilated cavity. His urine output over last 24 H has been 2,700 ml, symptoms continue to improve but not yet back to baseline. Continue diuresis with furosemide 60 mg IV q12. Will continue carvedilol 3,125 mg po bid. Patient is candidate for entresto, will check old records before discharge.    2. AKI on CKD stage 3a. Renal function with serum cr down to 1,41 from 1,72, will continue current dose of furosemide, and follow renal panel in am, avoid nephrotoxic medications.   3. T2DM. Fasting glucose this am is 195, will continue glucose cover and monitoring with insulin sliding scale, patient is tolerating po well.   4, High LFT. Follow liver panel in am., suspected congested hepatopathy.   DVT  prophylaxis: heparin   Code Status: full  Family Communication: no family at the bedside   Disposition Plan/ discharge barriers: pending clinical improvement.    Body mass index is 24.05 kg/m. Malnutrition Type:      Malnutrition Characteristics:      Nutrition Interventions:     RN Pressure Injury Documentation:     Consultants:     Procedures:     Antimicrobials:       Subjective: Patient reports improvement in dyspnea but not yet back to baseline, persistent dyspnea, moderate in intensity, associated with cough, no improvement factors, no chest pain or palpitation, no nausea or vomiting.   Objective: Vitals:   08/03/18 0613 08/03/18 0614 08/03/18 0735 08/03/18 1132  BP: 109/84  109/75 104/78  Pulse: 66  80 67  Resp: 16  17 19   Temp: (!) 97.2 F (36.2 C)   98 F (36.7 C)  TempSrc:    Oral  SpO2: 98%  100% 100%  Weight:  80.4 kg    Height:        Intake/Output Summary (Last 24 hours) at 08/03/2018 1230 Last data filed at 08/03/2018 1138 Gross per 24 hour  Intake 1420 ml  Output 3150 ml  Net -1730 ml   Filed Weights   08/01/18 2220 08/02/18 0500 08/03/18 0614  Weight: 84.9 kg 84 kg 80.4 kg    Examination:   General: deconditioned  Neurology: Awake and alert, non focal  E ENT: mild pallor, no icterus, oral mucosa moist Cardiovascular: No JVD. S1-S2 present, rhythmic, no gallops, rubs, or murmurs. Trace lower extremity edema.  Pulmonary: positive breath sounds bilaterally, a, no wheezing, rhonchi, positive bibasilar rales. Gastrointestinal. Abdomen with no organomegaly, non tender, no rebound or guarding Skin. No rashes Musculoskeletal: no joint deformities     Data Reviewed: I have personally reviewed following labs and imaging studies  CBC: Recent Labs  Lab 08/01/18 1829 08/01/18 2339 08/02/18 0243  WBC 4.8 4.9 5.0  NEUTROABS  --   --  2.3  HGB 14.0 13.3 13.9  HCT 42.9 40.3 41.4  MCV 82.0 81.1 81.3  PLT 190 182 179   Basic  Metabolic Panel: Recent Labs  Lab 08/01/18 1829 08/01/18 2339 08/02/18 0243  NA 136  --  135  K 4.2  --  3.9  CL 107  --  103  CO2 18*  --  22  GLUCOSE 231*  --  308*  BUN 29*  --  30*  CREATININE 1.63* 1.72* 1.72*  CALCIUM 9.0  --  8.6*  MG  --   --  1.8   GFR: Estimated Creatinine Clearance: 55.1 mL/min (A) (by C-G formula based on SCr of 1.72 mg/dL (H)). Liver Function Tests: Recent Labs  Lab 08/01/18 1834 08/02/18 0243  AST 44* 37  ALT 114* 104*  ALKPHOS 160* 151*  BILITOT 1.8* 1.2  PROT 6.1* 6.1*  ALBUMIN 3.2* 3.0*   No results for input(s): LIPASE, AMYLASE in the last 168 hours. No results for input(s): AMMONIA in the last 168 hours. Coagulation Profile: No results for input(s): INR, PROTIME in the last 168 hours. Cardiac Enzymes: No results for input(s): CKTOTAL, CKMB, CKMBINDEX, TROPONINI in the last 168 hours. BNP (last 3 results) No results for input(s): PROBNP in the last 8760 hours. HbA1C: No results for input(s): HGBA1C in the last 72 hours. CBG: Recent Labs  Lab 08/02/18 1147 08/02/18 1554 08/02/18 2126 08/03/18 0610 08/03/18 1137  GLUCAP 268* 157* 207* 181* 253*   Lipid Profile: No results for input(s): CHOL, HDL, LDLCALC, TRIG, CHOLHDL, LDLDIRECT in the last 72 hours. Thyroid Function Tests: No results for input(s): TSH, T4TOTAL, FREET4, T3FREE, THYROIDAB in the last 72 hours. Anemia Panel: No results for input(s): VITAMINB12, FOLATE, FERRITIN, TIBC, IRON, RETICCTPCT in the last 72 hours.    Radiology Studies: I have reviewed all of the imaging during this hospital visit personally     Scheduled Meds:  aspirin EC  81 mg Oral Daily   carvedilol  3.125 mg Oral BID WC   enoxaparin (LOVENOX) injection  40 mg Subcutaneous Daily   furosemide  60 mg Intravenous BID   insulin aspart  0-9 Units Subcutaneous TID WC   pantoprazole  40 mg Oral Daily   Continuous Infusions:   LOS: 2 days        Kerry Heritage Annett Gula, MD

## 2018-08-04 LAB — HEPATIC FUNCTION PANEL
ALT: 74 U/L — ABNORMAL HIGH (ref 0–44)
AST: 26 U/L (ref 15–41)
Albumin: 3 g/dL — ABNORMAL LOW (ref 3.5–5.0)
Alkaline Phosphatase: 138 U/L — ABNORMAL HIGH (ref 38–126)
Bilirubin, Direct: 0.2 mg/dL (ref 0.0–0.2)
Indirect Bilirubin: 0.7 mg/dL (ref 0.3–0.9)
Total Bilirubin: 0.9 mg/dL (ref 0.3–1.2)
Total Protein: 5.8 g/dL — ABNORMAL LOW (ref 6.5–8.1)

## 2018-08-04 LAB — BASIC METABOLIC PANEL
Anion gap: 10 (ref 5–15)
BUN: 22 mg/dL — ABNORMAL HIGH (ref 6–20)
CO2: 25 mmol/L (ref 22–32)
Calcium: 8.4 mg/dL — ABNORMAL LOW (ref 8.9–10.3)
Chloride: 101 mmol/L (ref 98–111)
Creatinine, Ser: 1.35 mg/dL — ABNORMAL HIGH (ref 0.61–1.24)
GFR calc Af Amer: >60 mL/min (ref >60)
GFR calc non Af Amer: 60 mL/min — ABNORMAL LOW (ref >60)
Glucose, Bld: 121 mg/dL — ABNORMAL HIGH (ref 70–99)
Potassium: 3.6 mmol/L (ref 3.5–5.1)
Sodium: 136 mmol/L (ref 135–145)

## 2018-08-04 LAB — GLUCOSE, CAPILLARY
Glucose-Capillary: 123 mg/dL — ABNORMAL HIGH (ref 70–99)
Glucose-Capillary: 269 mg/dL — ABNORMAL HIGH (ref 70–99)

## 2018-08-04 MED ORDER — FUROSEMIDE 40 MG PO TABS: 40.0000 mg | ORAL_TABLET | Freq: Every day | ORAL | 0 refills | Status: DC

## 2018-08-04 MED FILL — FUROSEMIDE 40 MG TABLET: 40 | 30 days supply | Qty: 30 | Fill #0

## 2018-08-04 NOTE — Discharge Summary (Signed)
Physician Discharge Summary  Kerry Hamilton ZOX:[id number] DOB: [birth year] DOA: 08/01/2018  PCP: Patient, No Pcp Per  Admit date: 08/01/2018 Discharge date: 08/04/2018  Admitted From: Home  Disposition:  Home   Recommendations for Outpatient Follow-up and new medication changes:  1. Follow up with Primary Care in 7 days. 2. Patient instructed to continue furosemide at an increased fose of 40 mg daily 3. New prescription for Entresto  4. Continue carvedilol.   Home Health: No  Equipment/Devices: No    Discharge Condition: stable  CODE STATUS: full  Diet recommendation: heart healthy   Brief/Interim Summary: 53 year old male who presented with dyspnea. He does have significant past medical history for systolic heart failure, type 2 diabetes mellitus, cocaine and alcohol abuse. He reported 48 hours of worsening dyspnea, he has been noncompliant with his medical or dietary regimen. On his initial physical examination blood pressure 114/85, heart rate 80, respiratory rate 33, oxygen saturation 97% his lungs had no rales or rhonchi, heart S1-S2 present rhythmic, abdomen soft, no lower extremity edema. Sodium 136, potassium 4.2, chloride 107, bicarb 18, glucose 231, BUN 29, creatinine 1.63, HS troponin 17, BNP 3419, white count 4.8, hemoglobin 14.0, hematocrit 42.9, platelets 190. Chest radiograph with significant cardiomegaly. EKG 86 bpm, right axis deviation, sinus rhythm with a right bundle branch block, no significant ST segment or T wave changes.  Patient was admitted to the hospital working diagnosis acute on chronic systolic heart failure exacerbation.  1.  Acute on chronic decompensated systolic heart failure, ejection fraction 15% with severely dilated cavity.  Patient was admitted to the telemetry ward, he received aggressive diuresis with IV furosemide, negative fluid balance was achieved (-3,140 ml), with significant improvement of his symptoms.  Patient was advised about compliance with  medications.  Patient will continue carvedilol, Entresto and furosemide.  Follow-up with cardiology as scheduled.  2.  Acute kidney injury chronic kidney disease stage IIIa.  Patient tolerated well diuresis with IV furosemide, his discharge creatinine is 1.35, potassium 3.6, bicarb 25.  Patient continue taking furosemide 40 mg daily, follow-up kidney function as an outpatient within 7 days.  3.  Type 2 diabetes mellitus.  Glucose remained stable, he was placed on insulin sliding scale for glucose coverage and monitoring.  Continue diabetic diet control at home.  4.  Congestive hepatopathy.  Liver enzymes improved with diuresis, discharge AST 26, ALT 74.   Discharge Diagnoses:  Principal Problem:   Acute on chronic systolic CHF (congestive heart failure) (HCC) Active Problems:   Diabetes mellitus type 2, noninsulin dependent (HCC)   LFT elevation   Elevated liver enzymes    Discharge Instructions   Allergies as of 08/04/2018   No Known Allergies     Medication List    TAKE these medications   aspirin 81 MG EC tablet Take 1 tablet (81 mg total) by mouth daily.   carvedilol 3.125 MG tablet Commonly known as: COREG Take 1 tablet (3.125 mg total) by mouth 2 (two) times daily with a meal.   furosemide 40 MG tablet Commonly known as: LASIX Take 1 tablet (40 mg total) by mouth daily. What changed:   medication strength  how much to take  when to take this   pantoprazole 40 MG tablet Commonly known as: PROTONIX Take 1 tablet (40 mg total) by mouth daily.   sacubitril-valsartan 24-26 MG Commonly known as: ENTRESTO Take 1 tablet by mouth 2 (two) times daily.      Follow-up Information    [facility]. Go on 08/11/2018.   Why: @10 :30 Contact information: [place] [id number] [phone]         No Known Allergies  Consultations:     Procedures/Studies: Dg Chest 2 View  Result Date:  07/13/2018 CLINICAL DATA:  Congestive heart failure. EXAM: CHEST - 2 VIEW COMPARISON:  Jun 10, 2018 FINDINGS: There is cardiomegaly, similar to prior study. There is generalized volume overload. There are likely small bilateral pleural effusions. There is no pneumothorax. No acute osseous abnormality. There is no displaced fracture. IMPRESSION: Cardiomegaly with findings of congestive heart failure including small bilateral pleural effusions. Electronically Signed   By: Constance Holster M.D.   On: 07/13/2018 19:06   Dg Chest Portable 1 View  Result Date: 08/01/2018 CLINICAL DATA:  Shortness of breath.  History of CHF. EXAM: PORTABLE CHEST 1 VIEW COMPARISON:  07/13/2018 FINDINGS: The heart is enlarged but stable. Mild central vascular congestion without overt pulmonary edema. No definite pleural effusions. No infiltrates. The bony thorax is intact. IMPRESSION: Cardiac enlargement and central vascular congestion without overt pulmonary edema or pleural effusions. Electronically Signed   By: Marijo Sanes M.D.   On: 08/01/2018 18:39   US Abdomen Limited Ruq  Result Date: 08/01/2018 CLINICAL DATA:  Elevated liver enzymes, abdominal pain for 2 days. EXAM: ULTRASOUND ABDOMEN LIMITED RIGHT UPPER QUADRANT COMPARISON:  Abdominal ultrasound 06/10/2018 FINDINGS: Gallbladder: No gallstones or wall thickening visualized. No sonographic Murphy sign noted by sonographer. Common bile duct: Diameter: 3.2 Liver: No focal lesion identified. Within normal limits in parenchymal echogenicity. Portal vein is patent on color Doppler imaging with normal direction of blood flow towards the liver. Additional: Trace right pleural effusion. Small volume of anechoic free fluid seen adjacent posterior left lobe liver, within Morrison's pouch. IMPRESSION: Right pleural effusion and anechoic free fluid in Morison's pouch. Could correlate with fluid status in the setting of CHF. No convincing sonographic evidence of acute cholecystitis or  other acute right upper quadrant abnormality. Electronically Signed   By: MD Lovena Le   On: 08/01/2018 21:20      Procedures:   Subjective: Patient is feeling better, back to his baseline, no dyspnea or chest pain, no nausea or vomiting.   Discharge Exam: Vitals:   08/04/18 0611 08/04/18 0906  BP: 103/71 96/65  Pulse: 66 67  Resp: 18 20  Temp: 98.3 F (36.8 C) 98.2 F (36.8 C)  SpO2: 98%    Vitals:   08/04/18 0021 08/04/18 0608 08/04/18 0611 08/04/18 0906  BP: 96/73  103/71 96/65  Pulse: 66  66 67  Resp: 18  18 20   Temp: (!) 97.5 F (36.4 C)  98.3 F (36.8 C) 98.2 F (36.8 C)  TempSrc: Oral  Oral Oral  SpO2: 99%  98%   Weight:  79.3 kg    Height:        General: Not in pain or dyspnea  Neurology: Awake and alert, non focal  E ENT:  pallor, no icterus, oral mucosa moist Cardiovascular: No JVD. S1-S2 present, rhythmic, no gallops, rubs, or murmurs. No lower extremity edema. Pulmonary: positive breath sounds bilaterally, adequate air movement, no wheezing, rhonchi or rales. Gastrointestinal. Abdomen with, no organomegaly, non tender, no rebound or guarding Skin. No rashes Musculoskeletal: no joint deformities   The results of significant diagnostics from this hospitalization (including imaging, microbiology, ancillary and laboratory) are listed below for reference.     Microbiology: Recent Results (from the past 240 hour(s))  SARS  Coronavirus 2 (CEPHEID- Performed in [facility] hospital lab), Hosp Order     Status: None   Collection Time: 08/01/18  7:53 PM   Specimen: Nasopharyngeal Swab  Result Value Ref Range Status   SARS Coronavirus 2 NEGATIVE NEGATIVE Final    Comment: (NOTE) If result is NEGATIVE SARS-CoV-2 target nucleic acids are NOT DETECTED. The SARS-CoV-2 RNA is generally detectable in upper and lower  respiratory specimens during the acute phase of infection. The lowest  concentration of SARS-CoV-2 viral copies this assay can detect is 250   copies / mL. A negative result does not preclude SARS-CoV-2 infection  and should not be used as the sole basis for treatment or other  patient management decisions.  A negative result may occur with  improper specimen collection / handling, submission of specimen other  than nasopharyngeal swab, presence of viral mutation(s) within the  areas targeted by this assay, and inadequate number of viral copies  (<250 copies / mL). A negative result must be combined with clinical  observations, patient history, and epidemiological information. If result is POSITIVE SARS-CoV-2 target nucleic acids are DETECTED. The SARS-CoV-2 RNA is generally detectable in upper and lower  respiratory specimens dur ing the acute phase of infection.  Positive  results are indicative of active infection with SARS-CoV-2.  Clinical  correlation with patient history and other diagnostic information is  necessary to determine patient infection status.  Positive results do  not rule out bacterial infection or co-infection with other viruses. If result is PRESUMPTIVE POSTIVE SARS-CoV-2 nucleic acids MAY BE PRESENT.   A presumptive positive result was obtained on the submitted specimen  and confirmed on repeat testing.  While 2019 novel coronavirus  (SARS-CoV-2) nucleic acids may be present in the submitted sample  additional confirmatory testing may be necessary for epidemiological  and / or clinical management purposes  to differentiate between  SARS-CoV-2 and other Sarbecovirus currently known to infect humans.  If clinically indicated additional testing with an alternate test  methodology [phone]) is advised. The SARS-CoV-2 RNA is generally  detectable in upper and lower respiratory sp ecimens during the acute  phase of infection. The expected result is Negative. Fact Sheet for Patients:  BoilerBrush.com.cyhttps://www.fda.gov/media/136312/download Fact Sheet for Healthcare  Providers: https://pope.com/https://www.fda.gov/media/136313/download This test is not yet approved or cleared by the [place] FDA and has been authorized for detection and/or diagnosis of SARS-CoV-2 by FDA under an Emergency Use Authorization (EUA).  This EUA will remain in effect (meaning this test can be used) for the duration of the COVID-19 declaration under Section 564(b)(1) of the Act, 21 U.S.C. section 360bbb-3(b)(1), unless the authorization is terminated or revoked sooner. Performed at [facility], [address] [place], [place] [address]      Labs: BNP (last 3 results) Recent Labs    06/10/18 2052 07/13/18 1759 08/01/18 1827  BNP 1,634.1* 4,315.9* 3,419.3*   Basic Metabolic Panel: Recent Labs  Lab 08/01/18 1829 08/01/18 2339 08/02/18 0243 08/03/18 1345 08/04/18 0606  NA 136  --  135 135 136  K 4.2  --  3.9 3.5 3.6  CL 107  --  103 99 101  CO2 18*  --  22 25 25   GLUCOSE 231*  --  308* 195* 121*  BUN 29*  --  30* 23* 22*  CREATININE 1.63* 1.72* 1.72* 1.41* 1.35*  CALCIUM 9.0  --  8.6* 8.5* 8.4*  MG  --   --  1.8  --   --  Liver Function Tests: Recent Labs  Lab 08/01/18 1834 08/02/18 0243 08/04/18 0606  AST 44* 37 26  ALT 114* 104* 74*  ALKPHOS 160* 151* 138*  BILITOT 1.8* 1.2 0.9  PROT 6.1* 6.1* 5.8*  ALBUMIN 3.2* 3.0* 3.0*   No results for input(s): LIPASE, AMYLASE in the last 168 hours. No results for input(s): AMMONIA in the last 168 hours. CBC: Recent Labs  Lab 08/01/18 1829 08/01/18 2339 08/02/18 0243  WBC 4.8 4.9 5.0  NEUTROABS  --   --  2.3  HGB 14.0 13.3 13.9  HCT 42.9 40.3 41.4  MCV 82.0 81.1 81.3  PLT 190 182 179   Cardiac Enzymes: No results for input(s): CKTOTAL, CKMB, CKMBINDEX, TROPONINI in the last 168 hours. BNP: Invalid input(s): POCBNP CBG: Recent Labs  Lab 08/03/18 0610 08/03/18 1137 08/03/18 1705 08/03/18 2131 08/04/18 0612  GLUCAP 181* 253* 156* 163* 123*   D-Dimer No results for input(s): DDIMER in the  last 72 hours. Hgb A1c No results for input(s): HGBA1C in the last 72 hours. Lipid Profile No results for input(s): CHOL, HDL, LDLCALC, TRIG, CHOLHDL, LDLDIRECT in the last 72 hours. Thyroid function studies No results for input(s): TSH, T4TOTAL, T3FREE, THYROIDAB in the last 72 hours.  Invalid input(s): FREET3 Anemia work up No results for input(s): VITAMINB12, FOLATE, FERRITIN, TIBC, IRON, RETICCTPCT in the last 72 hours. Urinalysis    Component Value Date/Time   COLORURINE YELLOW 06/10/2018 1614   APPEARANCEUR CLEAR 06/10/2018 1614   LABSPEC 1.032 (H) 06/10/2018 1614   PHURINE 5.0 06/10/2018 1614   GLUCOSEU NEGATIVE 06/10/2018 1614   HGBUR NEGATIVE 06/10/2018 1614   BILIRUBINUR NEGATIVE 06/10/2018 1614   KETONESUR NEGATIVE 06/10/2018 1614   PROTEINUR 100 (A) 06/10/2018 1614   NITRITE NEGATIVE 06/10/2018 1614   LEUKOCYTESUR NEGATIVE 06/10/2018 1614   Sepsis Labs Invalid input(s): PROCALCITONIN,  WBC,  LACTICIDVEN Microbiology Recent Results (from the past 240 hour(s))  SARS Coronavirus 2 (CEPHEID- Performed in [facility] hospital lab), Hosp Order     Status: None   Collection Time: 08/01/18  7:53 PM   Specimen: Nasopharyngeal Swab  Result Value Ref Range Status   SARS Coronavirus 2 NEGATIVE NEGATIVE Final    Comment: (NOTE) If result is NEGATIVE SARS-CoV-2 target nucleic acids are NOT DETECTED. The SARS-CoV-2 RNA is generally detectable in upper and lower  respiratory specimens during the acute phase of infection. The lowest  concentration of SARS-CoV-2 viral copies this assay can detect is 250  copies / mL. A negative result does not preclude SARS-CoV-2 infection  and should not be used as the sole basis for treatment or other  patient management decisions.  A negative result may occur with  improper specimen collection / handling, submission of specimen other  than nasopharyngeal swab, presence of viral mutation(s) within the  areas targeted by this assay, and  inadequate number of viral copies  (<250 copies / mL). A negative result must be combined with clinical  observations, patient history, and epidemiological information. If result is POSITIVE SARS-CoV-2 target nucleic acids are DETECTED. The SARS-CoV-2 RNA is generally detectable in upper and lower  respiratory specimens dur ing the acute phase of infection.  Positive  results are indicative of active infection with SARS-CoV-2.  Clinical  correlation with patient history and other diagnostic information is  necessary to determine patient infection status.  Positive results do  not rule out bacterial infection or co-infection with other viruses. If result is PRESUMPTIVE POSTIVE SARS-CoV-2 nucleic acids MAY BE PRESENT.  A presumptive positive result was obtained on the submitted specimen  and confirmed on repeat testing.  While 2019 novel coronavirus  (SARS-CoV-2) nucleic acids may be present in the submitted sample  additional confirmatory testing may be necessary for epidemiological  and / or clinical management purposes  to differentiate between  SARS-CoV-2 and other Sarbecovirus currently known to infect humans.  If clinically indicated additional testing with an alternate test  methodology [phone]) is advised. The SARS-CoV-2 RNA is generally  detectable in upper and lower respiratory sp ecimens during the acute  phase of infection. The expected result is Negative. Fact Sheet for Patients:  BoilerBrush.com.cyhttps://www.fda.gov/media/136312/download Fact Sheet for Healthcare Providers: https://pope.com/https://www.fda.gov/media/136313/download This test is not yet approved or cleared by the [place] FDA and has been authorized for detection and/or diagnosis of SARS-CoV-2 by FDA under an Emergency Use Authorization (EUA).  This EUA will remain in effect (meaning this test can be used) for the duration of the COVID-19 declaration under Section 564(b)(1) of the Act, 21 U.S.C. section 360bbb-3(b)(1), unless the  authorization is terminated or revoked sooner. Performed at [facility], [address] [place], [place] [address]      Time coordinating discharge: 45 minutes  SIGNED:   Coralie KeensMauricio Daniel Lycia Sachdeva, MD  [facility] 08/04/2018, 11:20 AM

## 2018-08-04 NOTE — Plan of Care (Signed)
  Problem: Education: Goal: Ability to demonstrate management of disease process will improve Outcome: Progressing Goal: Ability to verbalize understanding of medication therapies will improve Outcome: Progressing Goal: Individualized Educational Video(s) Outcome: Progressing   Problem: Activity: Goal: Capacity to carry out activities will improve Outcome: Progressing   Problem: Cardiac: Goal: Ability to achieve and maintain adequate cardiopulmonary perfusion will improve Outcome: Progressing   Problem: Education: Goal: Knowledge of General Education information will improve Description: Including pain rating scale, medication(s)/side effects and non-pharmacologic comfort measures Outcome: Progressing   Problem: Health Behavior/Discharge Planning: Goal: Ability to manage health-related needs will improve Outcome: Progressing   Problem: Clinical Measurements: Goal: Ability to maintain clinical measurements within normal limits will improve Outcome: Progressing Goal: Will remain free from infection Outcome: Progressing Goal: Diagnostic test results will improve Outcome: Progressing Goal: Respiratory complications will improve Outcome: Progressing Goal: Cardiovascular complication will be avoided Outcome: Progressing   Problem: Activity: Goal: Risk for activity intolerance will decrease Outcome: Progressing   Problem: Nutrition: Goal: Adequate nutrition will be maintained Outcome: Progressing   Problem: Coping: Goal: Level of anxiety will decrease Outcome: Progressing   Problem: Elimination: Goal: Will not experience complications related to bowel motility Outcome: Progressing Goal: Will not experience complications related to urinary retention Outcome: Progressing   Problem: Pain Managment: Goal: General experience of comfort will improve Outcome: Progressing   Problem: Safety: Goal: Ability to remain free from injury will improve Outcome: Progressing    Problem: Skin Integrity: Goal: Risk for impaired skin integrity will decrease Outcome: Progressing   Problem: Education: Goal: Ability to demonstrate management of disease process will improve Outcome: Progressing Goal: Ability to verbalize understanding of medication therapies will improve Outcome: Progressing Goal: Individualized Educational Video(s) Outcome: Progressing   Problem: Activity: Goal: Capacity to carry out activities will improve Outcome: Progressing   Problem: Cardiac: Goal: Ability to achieve and maintain adequate cardiopulmonary perfusion will improve Outcome: Progressing   Problem: Education: Goal: Knowledge of General Education information will improve Description: Including pain rating scale, medication(s)/side effects and non-pharmacologic comfort measures Outcome: Progressing   Problem: Health Behavior/Discharge Planning: Goal: Ability to manage health-related needs will improve Outcome: Progressing   Problem: Clinical Measurements: Goal: Ability to maintain clinical measurements within normal limits will improve Outcome: Progressing Goal: Will remain free from infection Outcome: Progressing Goal: Diagnostic test results will improve Outcome: Progressing Goal: Respiratory complications will improve Outcome: Progressing Goal: Cardiovascular complication will be avoided Outcome: Progressing   Problem: Activity: Goal: Risk for activity intolerance will decrease Outcome: Progressing   Problem: Nutrition: Goal: Adequate nutrition will be maintained Outcome: Progressing   Problem: Coping: Goal: Level of anxiety will decrease Outcome: Progressing   Problem: Elimination: Goal: Will not experience complications related to bowel motility Outcome: Progressing Goal: Will not experience complications related to urinary retention Outcome: Progressing   Problem: Pain Managment: Goal: General experience of comfort will improve Outcome:  Progressing   Problem: Safety: Goal: Ability to remain free from injury will improve Outcome: Progressing   Problem: Skin Integrity: Goal: Risk for impaired skin integrity will decrease Outcome: Progressing   

## 2018-08-04 NOTE — Progress Notes (Signed)
CCMD notified this nurse pt had a 15 beat run of Vtach. Pt resting in bed with no c/o voiced. Will notify on call MD.

## 2018-08-07 ENCOUNTER — Other Ambulatory Visit: Payer: Self-pay

## 2018-08-07 ENCOUNTER — Encounter: Payer: Self-pay | Admitting: Internal Medicine

## 2018-08-07 ENCOUNTER — Ambulatory Visit (INDEPENDENT_AMBULATORY_CARE_PROVIDER_SITE_OTHER): Payer: Medicaid Other | Admitting: Internal Medicine

## 2018-08-07 VITALS — BP 96/71 | HR 84 | Ht 72.0 in | Wt 178.4 lb

## 2018-08-07 DIAGNOSIS — Z79899 Other long term (current) drug therapy: Secondary | ICD-10-CM

## 2018-08-07 DIAGNOSIS — I420 Dilated cardiomyopathy: Secondary | ICD-10-CM

## 2018-08-07 NOTE — Patient Instructions (Signed)
Medication Instructions:  The current medical regimen is effective;  continue present plan and medications as directed. Please refer to the Current Medication list given to you today. If you need a refill on your cardiac medications before your next appointment, please call your pharmacy.  Labwork: bmet and bnp TODAY HERE IN OUR OFFICE AT [facility]   Take the provided lab slips with you to the lab for your blood draw.   When you have your labs (blood work) drawn today and your tests are completely normal, you will receive your results only by MyChart Message (if you have MyChart) -OR-  A paper copy in the mail.  If you have any lab test that is abnormal or we need to change your treatment, we will call you to review these results.  Testing/Procedures: Echocardiogram IN November 2020- Your physician has requested that you have an echocardiogram. Echocardiography is a painless test that uses sound waves to create images of your heart. It provides your doctor with information about the size and shape of your heart and how well your heart's chambers and valves are working. This procedure takes approximately one hour. There are no restrictions for this procedure. This will be performed at our [facility] location - [address].  Follow-Up: You will need a follow up appointment AFTER ECHO.  You may see Pixie Casino, MD or one of the following Advanced Practice Providers on your designated Care Team:  Almyra Deforest, [place]  Fabian Sharp, PA-C     At [facility], you and your health needs are our priority.  As part of our continuing mission to provide you with exceptional heart care, we have created designated Provider Care Teams.  These Care Teams include your primary Cardiologist (physician) and Advanced Practice Providers (APPs -  Physician Assistants and Nurse Practitioners) who all work together to provide you with the care you need, when you need it.  Thank you for choosing [facility]  at [facility]!!

## 2018-08-08 LAB — BASIC METABOLIC PANEL
BUN/Creatinine Ratio: 16 (ref 9–20)
BUN: 22 mg/dL (ref 6–24)
CO2: 20 mmol/L (ref 20–29)
Calcium: 8.8 mg/dL (ref 8.7–10.2)
Chloride: 98 mmol/L (ref 96–106)
Creatinine, Ser: 1.36 mg/dL — ABNORMAL HIGH (ref 0.76–1.27)
GFR calc Af Amer: 69 mL/min/{1.73_m2} (ref >59)
GFR calc non Af Amer: 59 mL/min/{1.73_m2} — ABNORMAL LOW (ref >59)
Glucose: 232 mg/dL — ABNORMAL HIGH (ref 65–99)
Potassium: 4.2 mmol/L (ref 3.5–5.2)
Sodium: 138 mmol/L (ref 134–144)

## 2018-08-08 LAB — BRAIN NATRIURETIC PEPTIDE: BNP: 1054.2 pg/mL — ABNORMAL HIGH (ref 0.0–100.0)

## 2018-08-09 ENCOUNTER — Encounter: Payer: Self-pay | Admitting: Internal Medicine

## 2018-08-09 NOTE — Progress Notes (Signed)
OFFICE NOTE  Chief Complaint:  Hospital follow-up  Primary Care Physician: Patient, No Pcp Per  HPI:  Kerry Hamilton is a 53 y.o. male with a past medial history significant for schizophrenia, tobacco abuse and diet-controlled diabetes.  He presented in May with progressive shortness of breath with minimal activity was found to be in congestive heart failure with elevated BNP and troponin.  Showed LVEF 15% with severely dilated LV and pseudonormalization of diastolic function.  Ultimately underwent right and left heart catheterization after diuresis, demonstrated no significant coronary disease with mildly elevated LV filling pressures and a cardiac index of 2.2.  He was placed on maximally tolerated medical therapy based on his blood pressure, including aspirin 81 mg daily, carvedilol 3.125 mg twice daily, Lasix 40 mg daily, Protonix 40 mg daily and Entresto 24/26 mg twice daily.  So far he continues to do fairly well.  He says he is tolerating the medications.  His weight has been stable and reduced.  He denies any worsening shortness of breath or signs or symptoms of congestive heart failure.  He gets his medications through the transition of care pharmacy,.  PMHx:  Past Medical History:  Diagnosis Date  . Depression   . Diabetes mellitus without complication [facility])     Past Surgical History:  Procedure Laterality Date  . RIGHT/LEFT HEART CATH AND CORONARY ANGIOGRAPHY N/A 06/13/2018   Procedure: RIGHT/LEFT HEART CATH AND CORONARY ANGIOGRAPHY;  Surgeon: Martinique, Peter M, MD;  Location: [place] [facility];  Service: Cardiovascular;  Laterality: N/A;    FAMHx:  Family History  Problem Relation Age of Onset  . CAD Paternal Grandmother   . Diabetes Mellitus II Neg Hx     SOCHx:   reports that he has been smoking cigarettes. He has never used smokeless tobacco. He reports current alcohol use of about 4.0 standard drinks of alcohol per week. He reports that he does not use drugs.   ALLERGIES:  No Known Allergies  ROS: Pertinent items noted in HPI and remainder of comprehensive ROS otherwise negative.  HOME MEDS: Current Outpatient Medications on File Prior to Visit  Medication Sig Dispense Refill  . aspirin EC 81 MG EC tablet Take 1 tablet (81 mg total) by mouth daily. 90 tablet 0  . carvedilol (COREG) 3.125 MG tablet Take 1 tablet (3.125 mg total) by mouth 2 (two) times daily with a meal. 60 tablet 2  . furosemide (LASIX) 40 MG tablet Take 1 tablet (40 mg total) by mouth daily. 30 tablet 0  . pantoprazole (PROTONIX) 40 MG tablet Take 1 tablet (40 mg total) by mouth daily. 30 tablet 0  . sacubitril-valsartan (ENTRESTO) 24-26 MG Take 1 tablet by mouth 2 (two) times daily. 180 tablet 0   No current facility-administered medications on file prior to visit.     LABS/IMAGING: Results for orders placed or performed in visit on 08/07/18 (from the past 48 hour(s))  Basic metabolic panel     Status: Abnormal   Collection Time: 08/07/18  3:57 PM  Result Value Ref Range   Glucose 232 (H) 65 - 99 mg/dL   BUN 22 6 - 24 mg/dL   Creatinine, Ser 1.36 (H) 0.76 - 1.27 mg/dL   GFR calc non Af Amer 59 (L) >59 mL/min/1.73   GFR calc Af Amer 69 >59 mL/min/1.73   BUN/Creatinine Ratio 16 9 - 20   Sodium 138 134 - 144 mmol/L   Potassium 4.2 3.5 - 5.2 mmol/L   Chloride 98 96 -  106 mmol/L   CO2 20 20 - 29 mmol/L   Calcium 8.8 8.7 - 10.2 mg/dL  Brain natriuretic peptide     Status: Abnormal   Collection Time: 08/07/18  3:57 PM  Result Value Ref Range   BNP 1,054.2 (H) 0.0 - 100.0 pg/mL   No results found.  LIPID PANEL: No results found for: CHOL, TRIG, HDL, CHOLHDL, VLDL, LDLCALC, LDLDIRECT   WEIGHTS: Wt Readings from Last 3 Encounters:  08/07/18 178 lb 6.4 oz (80.9 kg)  08/04/18 174 lb 12.8 oz (79.3 kg)  07/17/18 190 lb 14.4 oz (86.6 kg)    VITALS: BP 96/71   Pulse 84   Ht 6' (1.829 m)   Wt 178 lb 6.4 oz (80.9 kg)   SpO2 99%   BMI 24.20 kg/m   EXAM: General  appearance: alert, no distress and thin Neck: no carotid bruit, no JVD and thyroid not enlarged, symmetric, no tenderness/mass/nodules Lungs: clear to auscultation bilaterally Heart: regular rate and rhythm, S1, S2 normal, no murmur, click, rub or gallop Abdomen: soft, non-tender; bowel sounds normal; no masses,  no organomegaly Extremities: extremities normal, atraumatic, no cyanosis or edema Pulses: 2+ and symmetric Skin: Skin color, texture, turgor normal. No rashes or lesions Neurologic: Grossly normal Psych: Pleasant  EKG: Deferred  ASSESSMENT: 1. Dilated cardiomyopathy with LVEF 15% 2. No significant obstructive coronary disease by cath (05/2018) 3. Schizophrenia 4. Tobacco abuse  PLAN: 1.   [name] has a dilated cardiomyopathy with EF 15%.  He is responded to diuresis and medical therapy.  Blood pressure is slightly low however he reports being asymptomatic with that.  He may be a little over diuresed.  I would like to check a be met in BNP and will adjust his diuretic accordingly.  Unfortunate blood pressure will not allow increase in Entresto at this point.  Ultimately he will need a repeat echo probably in 11month to reassess LV function and may be a candidate for AICD if it does not improve.  Plan follow-up with me at that time.  KPixie Casino MD, [facility] FHarwoodDirector of the Advanced Lipid Disorders &  Cardiovascular Risk Reduction Clinic Diplomate of the American Board of Clinical Lipidology Attending Cardiologist  Direct Dial: [phone] Fax: [phone] Website:  [email or web address] 08/09/2018, 8:24 AM

## 2018-08-11 ENCOUNTER — Inpatient Hospital Stay: Payer: Medicaid Other | Admitting: Family Medicine

## 2018-08-24 ENCOUNTER — Encounter: Payer: Self-pay | Admitting: Internal Medicine

## 2018-09-28 ENCOUNTER — Telehealth (HOSPITAL_COMMUNITY): Payer: Self-pay

## 2018-09-28 NOTE — Telephone Encounter (Signed)
1st attempt to contact PT to schedule New Pt appt with Heart Failure Clinic was unsuccessful.  

## 2018-10-04 ENCOUNTER — Emergency Department (HOSPITAL_COMMUNITY): Payer: Medicaid Other

## 2018-10-04 ENCOUNTER — Emergency Department (HOSPITAL_COMMUNITY)
Admission: EM | Admit: 2018-10-04 | Discharge: 2018-10-05 | Disposition: A | Discharge status: Home / Self Care | Payer: Medicaid Other | Attending: Emergency Medicine | Admitting: Emergency Medicine

## 2018-10-04 DIAGNOSIS — Y999 Unspecified external cause status: Secondary | ICD-10-CM | POA: Insufficient documentation

## 2018-10-04 DIAGNOSIS — Z20828 Contact with and (suspected) exposure to other viral communicable diseases: Secondary | ICD-10-CM | POA: Insufficient documentation

## 2018-10-04 DIAGNOSIS — E1122 Type 2 diabetes mellitus with diabetic chronic kidney disease: Secondary | ICD-10-CM | POA: Insufficient documentation

## 2018-10-04 DIAGNOSIS — Z7901 Long term (current) use of anticoagulants: Secondary | ICD-10-CM | POA: Insufficient documentation

## 2018-10-04 DIAGNOSIS — N189 Chronic kidney disease, unspecified: Secondary | ICD-10-CM | POA: Insufficient documentation

## 2018-10-04 DIAGNOSIS — F17210 Nicotine dependence, cigarettes, uncomplicated: Secondary | ICD-10-CM | POA: Insufficient documentation

## 2018-10-04 DIAGNOSIS — Y939 Activity, unspecified: Secondary | ICD-10-CM | POA: Insufficient documentation

## 2018-10-04 DIAGNOSIS — I5022 Chronic systolic (congestive) heart failure: Secondary | ICD-10-CM | POA: Insufficient documentation

## 2018-10-04 DIAGNOSIS — F250 Schizoaffective disorder, bipolar type: Secondary | ICD-10-CM | POA: Insufficient documentation

## 2018-10-04 DIAGNOSIS — Y929 Unspecified place or not applicable: Secondary | ICD-10-CM | POA: Insufficient documentation

## 2018-10-04 DIAGNOSIS — F329 Major depressive disorder, single episode, unspecified: Secondary | ICD-10-CM | POA: Insufficient documentation

## 2018-10-04 DIAGNOSIS — Z23 Encounter for immunization: Secondary | ICD-10-CM | POA: Insufficient documentation

## 2018-10-04 DIAGNOSIS — R45851 Suicidal ideations: Secondary | ICD-10-CM | POA: Insufficient documentation

## 2018-10-04 DIAGNOSIS — S0990XA Unspecified injury of head, initial encounter: Secondary | ICD-10-CM | POA: Insufficient documentation

## 2018-10-04 LAB — CBC WITH DIFFERENTIAL/PLATELET
Abs Immature Granulocytes: 0.01 10*3/uL (ref 0.00–0.07)
Basophils Absolute: 0 10*3/uL (ref 0.0–0.1)
Basophils Relative: 1 %
Eosinophils Absolute: 0.1 10*3/uL (ref 0.0–0.5)
Eosinophils Relative: 3 %
HCT: 39.7 % (ref 39.0–52.0)
Hemoglobin: 12.4 g/dL — ABNORMAL LOW (ref 13.0–17.0)
Immature Granulocytes: 0 %
Lymphocytes Relative: 36 %
Lymphs Abs: 1.2 10*3/uL (ref 0.7–4.0)
MCH: 26.8 pg (ref 26.0–34.0)
MCHC: 31.2 g/dL (ref 30.0–36.0)
MCV: 85.9 fL (ref 80.0–100.0)
Monocytes Absolute: 0.3 10*3/uL (ref 0.1–1.0)
Monocytes Relative: 8 %
Neutro Abs: 1.8 10*3/uL (ref 1.7–7.7)
Neutrophils Relative %: 52 %
Platelets: 179 10*3/uL (ref 150–400)
RBC: 4.62 MIL/uL (ref 4.22–5.81)
RDW: 16.2 % — ABNORMAL HIGH (ref 11.5–15.5)
WBC: 3.4 10*3/uL — ABNORMAL LOW (ref 4.0–10.5)
nRBC: 0 % (ref 0.0–0.2)

## 2018-10-04 LAB — ETHANOL: Alcohol, Ethyl (B): <10 mg/dL (ref <10)

## 2018-10-04 LAB — COMPREHENSIVE METABOLIC PANEL
ALT: 25 U/L (ref 0–44)
AST: 29 U/L (ref 15–41)
Albumin: 2.9 g/dL — ABNORMAL LOW (ref 3.5–5.0)
Alkaline Phosphatase: 83 U/L (ref 38–126)
Anion gap: 8 (ref 5–15)
BUN: 17 mg/dL (ref 6–20)
CO2: 24 mmol/L (ref 22–32)
Calcium: 8.3 mg/dL — ABNORMAL LOW (ref 8.9–10.3)
Chloride: 108 mmol/L (ref 98–111)
Creatinine, Ser: 1.08 mg/dL (ref 0.61–1.24)
GFR calc Af Amer: >60 mL/min (ref >60)
GFR calc non Af Amer: >60 mL/min (ref >60)
Glucose, Bld: 184 mg/dL — ABNORMAL HIGH (ref 70–99)
Potassium: 3.8 mmol/L (ref 3.5–5.1)
Sodium: 140 mmol/L (ref 135–145)
Total Bilirubin: 0.5 mg/dL (ref 0.3–1.2)
Total Protein: 6.1 g/dL — ABNORMAL LOW (ref 6.5–8.1)

## 2018-10-04 MED ORDER — PANTOPRAZOLE SODIUM 40 MG PO TBEC
40.0000 mg | DELAYED_RELEASE_TABLET | Freq: Every day | ORAL | Status: DC
  Administered 2018-10-05: 40 mg via ORAL
  Filled 2018-10-04: qty 1

## 2018-10-04 MED ORDER — CARVEDILOL 3.125 MG PO TABS
3.1250 mg | ORAL_TABLET | Freq: Two times a day (BID) | ORAL | Status: DC
  Administered 2018-10-05: 3.125 mg via ORAL
  Filled 2018-10-04: qty 1

## 2018-10-04 MED ORDER — TETANUS-DIPHTH-ACELL PERTUSSIS 5-2.5-18.5 LF-MCG/0.5 IM SUSP
0.5000 mL | Freq: Once | INTRAMUSCULAR | Status: AC
  Administered 2018-10-04: 0.5 mL via INTRAMUSCULAR
  Filled 2018-10-04: qty 0.5

## 2018-10-04 MED ORDER — FUROSEMIDE 20 MG PO TABS
40.0000 mg | ORAL_TABLET | Freq: Every day | ORAL | Status: DC
  Administered 2018-10-05: 40 mg via ORAL
  Filled 2018-10-04: qty 2
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
## 2018-10-04 NOTE — ED Notes (Signed)
Patient transported to CT 
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
## 2018-10-04 NOTE — ED Notes (Signed)
Gave pt urinal for urine collection 

## 2018-10-05 ENCOUNTER — Other Ambulatory Visit: Payer: Self-pay

## 2018-10-05 ENCOUNTER — Encounter (HOSPITAL_COMMUNITY): Payer: Self-pay

## 2018-10-05 ENCOUNTER — Inpatient Hospital Stay (HOSPITAL_COMMUNITY)
Admission: AD | Admit: 2018-10-05 | Discharge: 2018-10-09 | DRG: 885 | Disposition: A | Discharge status: Home / Self Care | Payer: Medicaid Other | Attending: Psychiatry | Admitting: Psychiatry

## 2018-10-05 DIAGNOSIS — F17210 Nicotine dependence, cigarettes, uncomplicated: Secondary | ICD-10-CM | POA: Diagnosis present

## 2018-10-05 DIAGNOSIS — F1420 Cocaine dependence, uncomplicated: Secondary | ICD-10-CM | POA: Diagnosis present

## 2018-10-05 DIAGNOSIS — R45851 Suicidal ideations: Secondary | ICD-10-CM | POA: Diagnosis present

## 2018-10-05 DIAGNOSIS — I110 Hypertensive heart disease with heart failure: Secondary | ICD-10-CM | POA: Diagnosis present

## 2018-10-05 DIAGNOSIS — Z20828 Contact with and (suspected) exposure to other viral communicable diseases: Secondary | ICD-10-CM | POA: Diagnosis present

## 2018-10-05 DIAGNOSIS — G4700 Insomnia, unspecified: Secondary | ICD-10-CM | POA: Diagnosis present

## 2018-10-05 DIAGNOSIS — I509 Heart failure, unspecified: Secondary | ICD-10-CM | POA: Diagnosis present

## 2018-10-05 DIAGNOSIS — F250 Schizoaffective disorder, bipolar type: Secondary | ICD-10-CM | POA: Diagnosis present

## 2018-10-05 DIAGNOSIS — F1424 Cocaine dependence with cocaine-induced mood disorder: Secondary | ICD-10-CM | POA: Diagnosis not present

## 2018-10-05 DIAGNOSIS — Y900 Blood alcohol level of less than 20 mg/100 ml: Secondary | ICD-10-CM | POA: Diagnosis present

## 2018-10-05 DIAGNOSIS — F251 Schizoaffective disorder, depressive type: Secondary | ICD-10-CM | POA: Diagnosis not present

## 2018-10-05 DIAGNOSIS — E1165 Type 2 diabetes mellitus with hyperglycemia: Secondary | ICD-10-CM

## 2018-10-05 DIAGNOSIS — F259 Schizoaffective disorder, unspecified: Secondary | ICD-10-CM | POA: Diagnosis present

## 2018-10-05 DIAGNOSIS — F1020 Alcohol dependence, uncomplicated: Secondary | ICD-10-CM | POA: Diagnosis present

## 2018-10-05 DIAGNOSIS — E119 Type 2 diabetes mellitus without complications: Secondary | ICD-10-CM | POA: Diagnosis present

## 2018-10-05 LAB — RAPID URINE DRUG SCREEN, HOSP PERFORMED
Amphetamines: NOT DETECTED
Barbiturates: NOT DETECTED
Benzodiazepines: POSITIVE — AB
Cocaine: NOT DETECTED
Opiates: NOT DETECTED
Tetrahydrocannabinol: NOT DETECTED

## 2018-10-05 LAB — GLUCOSE, CAPILLARY
Glucose-Capillary: 98 mg/dL (ref 70–99)
Glucose-Capillary: 228 mg/dL — ABNORMAL HIGH (ref 70–99)

## 2018-10-05 LAB — SARS CORONAVIRUS 2 BY RT PCR (HOSPITAL ORDER, PERFORMED IN ~~LOC~~ HOSPITAL LAB): SARS Coronavirus 2: NEGATIVE

## 2018-10-05 MED ORDER — FLUOXETINE HCL 10 MG PO CAPS
10.0000 mg | ORAL_CAPSULE | Freq: Every day | ORAL | Status: DC
  Administered 2018-10-05 – 2018-10-07 (×3): 10 mg via ORAL
  Filled 2018-10-05 (×6): qty 1

## 2018-10-05 MED ORDER — ZIPRASIDONE MESYLATE 20 MG IM SOLR: 20.0000 mg | INTRAMUSCULAR | Status: DC | PRN

## 2018-10-05 MED ORDER — NICOTINE POLACRILEX 2 MG MT GUM
2.0000 mg | CHEWING_GUM | OROMUCOSAL | Status: DC | PRN
  Administered 2018-10-05 – 2018-10-09 (×5): 2 mg via ORAL
  Filled 2018-10-05 (×6): qty 1

## 2018-10-05 MED ORDER — LORAZEPAM 1 MG PO TABS: 1.0000 mg | ORAL_TABLET | ORAL | Status: DC | PRN

## 2018-10-05 MED ORDER — SACUBITRIL-VALSARTAN 24-26 MG PO TABS
1.0000 | ORAL_TABLET | Freq: Two times a day (BID) | ORAL | Status: DC
  Administered 2018-10-05 – 2018-10-09 (×8): 1 via ORAL
  Filled 2018-10-05 (×12): qty 1

## 2018-10-05 MED ORDER — QUETIAPINE FUMARATE 25 MG PO TABS
25.0000 mg | ORAL_TABLET | ORAL | Status: AC
  Administered 2018-10-05: 25 mg via ORAL
  Filled 2018-10-05 (×2): qty 1

## 2018-10-05 MED ORDER — ACETAMINOPHEN 325 MG PO TABS
650.0000 mg | ORAL_TABLET | Freq: Four times a day (QID) | ORAL | Status: DC | PRN
  Administered 2018-10-05 – 2018-10-08 (×3): 650 mg via ORAL
  Filled 2018-10-05 (×3): qty 2

## 2018-10-05 MED ORDER — OLANZAPINE 10 MG PO TBDP: 10.0000 mg | ORAL_TABLET | Freq: Three times a day (TID) | ORAL | Status: DC | PRN

## 2018-10-05 MED ORDER — INSULIN ASPART 100 UNIT/ML ~~LOC~~ SOLN
0.0000 [IU] | Freq: Three times a day (TID) | SUBCUTANEOUS | Status: DC
  Administered 2018-10-06: 2 [IU] via SUBCUTANEOUS
  Administered 2018-10-06 – 2018-10-07 (×2): 1 [IU] via SUBCUTANEOUS
  Administered 2018-10-07: 3 [IU] via SUBCUTANEOUS
  Administered 2018-10-08: 2 [IU] via SUBCUTANEOUS
  Administered 2018-10-08: 1 [IU] via SUBCUTANEOUS
  Administered 2018-10-09: 2 [IU] via SUBCUTANEOUS

## 2018-10-05 MED ORDER — FUROSEMIDE 40 MG PO TABS
40.0000 mg | ORAL_TABLET | Freq: Every day | ORAL | Status: DC
  Administered 2018-10-06 – 2018-10-09 (×4): 40 mg via ORAL
  Filled 2018-10-05 (×6): qty 1

## 2018-10-05 MED ORDER — CARVEDILOL 3.125 MG PO TABS
3.1250 mg | ORAL_TABLET | Freq: Two times a day (BID) | ORAL | Status: DC
  Administered 2018-10-05 – 2018-10-09 (×8): 3.125 mg via ORAL
  Filled 2018-10-05 (×12): qty 1

## 2018-10-05 MED ORDER — QUETIAPINE FUMARATE 50 MG PO TABS
50.0000 mg | ORAL_TABLET | Freq: Every day | ORAL | Status: DC
  Administered 2018-10-05 – 2018-10-08 (×4): 50 mg via ORAL
  Filled 2018-10-05 (×7): qty 1

## 2018-10-05 MED ORDER — FOLIC ACID 1 MG PO TABS
1.0000 mg | ORAL_TABLET | Freq: Every day | ORAL | Status: DC
  Administered 2018-10-05 – 2018-10-09 (×5): 1 mg via ORAL
  Filled 2018-10-05 (×7): qty 1

## 2018-10-05 MED ORDER — PANTOPRAZOLE SODIUM 40 MG PO TBEC
40.0000 mg | DELAYED_RELEASE_TABLET | Freq: Every day | ORAL | Status: DC
  Administered 2018-10-06 – 2018-10-09 (×4): 40 mg via ORAL
  Filled 2018-10-05 (×6): qty 1

## 2018-10-05 MED ORDER — LORAZEPAM 1 MG PO TABS: 1.0000 mg | ORAL_TABLET | Freq: Four times a day (QID) | ORAL | Status: DC | PRN

## 2018-10-05 MED ORDER — BACITRACIN-NEOMYCIN-POLYMYXIN OINTMENT TUBE
TOPICAL_OINTMENT | Freq: Three times a day (TID) | CUTANEOUS | Status: DC
  Administered 2018-10-05 – 2018-10-08 (×4): via TOPICAL
  Filled 2018-10-05: qty 14.17

## 2018-10-05 MED ORDER — VITAMIN B-1 100 MG PO TABS
100.0000 mg | ORAL_TABLET | Freq: Every day | ORAL | Status: DC
  Administered 2018-10-05 – 2018-10-09 (×5): 100 mg via ORAL
  Filled 2018-10-05 (×7): qty 1

## 2018-10-05 MED ORDER — RISPERIDONE 0.5 MG PO TABS
0.5000 mg | ORAL_TABLET | Freq: Every day | ORAL | Status: DC
  Filled 2018-10-05 (×2): qty 1

## 2018-10-05 MED ORDER — RISPERIDONE 1 MG PO TABS
1.0000 mg | ORAL_TABLET | Freq: Every day | ORAL | Status: DC
  Filled 2018-10-05: qty 1

## 2018-10-05 NOTE — ED Notes (Signed)
Pt still hasn't given urine specimen.
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
## 2018-10-05 NOTE — ED Notes (Signed)
Pt's belongings inventoried and placed in locker #4. Home meds with pharmacy.
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
## 2018-10-05 NOTE — ED Provider Notes (Signed)
Labs unremarkable. Patient will be assessed by psychiatry.  Patient currently medically stable    Ripley Fraise, MD 10/05/18 0030
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
## 2018-10-05 NOTE — ED Notes (Signed)
Patient was given a Snack and Drink. A Regular Diet was ordered for Lunch. 
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
## 2018-10-05 NOTE — Progress Notes (Signed)
This patient has been offered a bed at [facility].  Bed: 403-2 Accepting Provider: Dr.Clary RN Call for report: [phone]   This patient is under IVC and will require transportation via sheriff.  Stephanie Acre, LCSW-A Clinical Social Worker

## 2018-10-05 NOTE — ED Notes (Signed)
Patient says he does not need to call family

## 2018-10-05 NOTE — ED Notes (Signed)
TTS in progress 

## 2018-10-05 NOTE — Tx Team (Signed)
Initial Treatment Plan 10/05/2018 12:41 PM Stann Mainland MEQ:[id number]    PATIENT STRESSORS: Health problems Medication change or noncompliance Substance abuse   PATIENT STRENGTHS: Active sense of humor Motivation for treatment/growth   PATIENT IDENTIFIED PROBLEMS: "smoking crack"  "anxiety"  "depression"                 DISCHARGE CRITERIA:  Ability to meet basic life and health needs Improved stabilization in mood, thinking, and/or behavior Motivation to continue treatment in a less acute level of care Need for constant or close observation no longer present  PRELIMINARY DISCHARGE PLAN: Attend aftercare/continuing care group Outpatient therapy Return to previous living arrangement  PATIENT/FAMILY INVOLVEMENT: This treatment plan has been presented to and reviewed with the patient, Kerry Hamilton.  The patient and family have been given the opportunity to ask questions and make suggestions.  Baron Sane, RN 10/05/2018, 12:41 PM

## 2018-10-05 NOTE — ED Notes (Signed)
Report given.
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
## 2018-10-05 NOTE — ED Notes (Signed)
Patient accepted at [facility] room 403-bed 3.

## 2018-10-05 NOTE — Progress Notes (Signed)
EKG completed per MD order.  

## 2018-10-05 NOTE — Progress Notes (Signed)
Patient accepted at [facility], Room 403-2 Accepting provider: Dr. Mallie Darting Attending provider: Dr. Mallie Darting.    Netta Neat, MSW, LCSW Clinical Social Work

## 2018-10-06 DIAGNOSIS — F251 Schizoaffective disorder, depressive type: Secondary | ICD-10-CM

## 2018-10-06 LAB — GLUCOSE, CAPILLARY
Glucose-Capillary: 84 mg/dL (ref 70–99)
Glucose-Capillary: 149 mg/dL — ABNORMAL HIGH (ref 70–99)
Glucose-Capillary: 140 mg/dL — ABNORMAL HIGH (ref 70–99)

## 2018-10-06 LAB — TSH: TSH: 1.166 u[IU]/mL (ref 0.350–4.500)

## 2018-10-06 LAB — LIPID PANEL
Cholesterol: 160 mg/dL (ref 0–200)
HDL: 52 mg/dL
LDL Cholesterol: 95 mg/dL (ref 0–99)
Total CHOL/HDL Ratio: 3.1 ratio
Triglycerides: 67 mg/dL
VLDL: 13 mg/dL (ref 0–40)

## 2018-10-06 LAB — BASIC METABOLIC PANEL WITH GFR
Anion gap: 9 (ref 5–15)
BUN: 17 mg/dL (ref 6–20)
CO2: 28 mmol/L (ref 22–32)
Calcium: 8.9 mg/dL (ref 8.9–10.3)
Chloride: 106 mmol/L (ref 98–111)
Creatinine, Ser: 1.14 mg/dL (ref 0.61–1.24)
GFR calc Af Amer: >60 mL/min
GFR calc non Af Amer: >60 mL/min
Glucose, Bld: 103 mg/dL — ABNORMAL HIGH (ref 70–99)
Potassium: 3.7 mmol/L (ref 3.5–5.1)
Sodium: 143 mmol/L (ref 135–145)

## 2018-10-06 LAB — BRAIN NATRIURETIC PEPTIDE: B Natriuretic Peptide: 1633 pg/mL — ABNORMAL HIGH (ref 0.0–100.0)

## 2018-10-06 MED ORDER — FUROSEMIDE 20 MG PO TABS
20.0000 mg | ORAL_TABLET | Freq: Every day | ORAL | Status: DC
  Administered 2018-10-06 – 2018-10-08 (×3): 20 mg via ORAL
  Filled 2018-10-06 (×5): qty 1

## 2018-10-06 NOTE — Progress Notes (Signed)
Recreation Therapy Notes  Date:  9.11.20 Time: 0930 Location: 400 Hall Dayroom  Group Topic: Stress Management  Goal Area(s) Addresses:  Patient will identify positive stress management techniques. Patient will identify benefits of using stress management post d/c.  Intervention: Stress Management  Activity : Meditation    Education:  Stress Management, Discharge Planning.   Education Outcome: Acknowledges Education  Clinical Observations/Feedback:  Pt did not attend group.    Victorino Sparrow, LRT/CTRS         Victorino Sparrow A 10/06/2018 12:26 PM

## 2018-10-06 NOTE — Progress Notes (Signed)
Pt was in dayroom upon initial approach.  He presents with depressed affect and mood.  Describes his day as "all right."  Reports his goal is to "read this book right here."  Pt's speech is loud.  He has been interacting with peers and staff appropriately.  Pt denies SI/HI, hallucinations, and pain.   Met with pt 1:1.  Actively listened to pt and provided support and encouragement.  Medication administered per order.  15 minute safety checks in place.  Pt is compliant with medications.  He verbally contracts for safety.  Will continue to monitor and assess.  

## 2018-10-06 NOTE — [facility] Group Notes (Signed)
Adult Psychoeducational Group Note  Date:  10/06/2018 Time:  9:25 PM  Group Topic/Focus:  Wrap-Up Group:   The focus of this group is to help patients review their daily goal of treatment and discuss progress on daily workbooks.  Participation Level:  Active  Participation Quality:  Appropriate and Attentive  Affect:  Appropriate  Cognitive:  Alert and Appropriate  Insight: Appropriate and Good  Engagement in Group:  Engaged  Modes of Intervention:  Discussion and Education  Additional Comments:  Pt attended and participated in wrapup group and rated their day a 7/10. Pt is still working on their goal, which was to think positive, watch tv and read.   Cristi Loron 10/06/2018, 9:25 PM

## 2018-10-06 NOTE — Progress Notes (Addendum)
[facility] MD Progress Note  10/06/2018 12:54 PM Kerry Hamilton  MRN:  [id number] Subjective:  Patient reports some improvement compared to how he felt prior to admission. Denies SI at this time. Currently does not endorse medication side effects. Objective : I have reviewed case with treatment team and have met with patient. 53 year old male, lives in [place]. He  presented for depression, suicidal ideations, self injurious behaviors ( head banging) , auditory hallucinations. Has been diagnosed with Schizoaffective Disorder . History of Alcohol and Cocaine Use Disorder.  Admission BAL negative .  Was not taking psychiatric medications prior to admission. He has a history of Cardiac Disease and has history of CHF/low Ejection Fraction.  Today patient reports some improvement compared to admission. Describes lingering depression but acknowledges he is feeling better than he did prior to admission. Currently denies hallucinations , does not appear internally preoccupied, and states hallucinations have decreased since admission.  Denies medication side effects thus far . Labs- EKG NSR, HR 58, QTc 481, CBG today 149, BMP unremarkable with BUN 17 and Creatinine 1.14. TSH 1.166. B Natriuretic Peptide is elevated .  Of note, patient currently does not endorse orthopnea, NPD, or peripheral edema, and no dyspnea is noted at room air .  Limited milieu participation . No agitated or disruptive behaviors on unit  Principal Problem:  Schizoaffective Disorder  Diagnosis: Active Problems:   Schizoaffective disorder ([place])  Total Time spent with patient: 20 minutes  Past Psychiatric History:   Past Medical History:  Past Medical History:  Diagnosis Date  . Depression   . Diabetes mellitus without complication [facility])     Past Surgical History:  Procedure Laterality Date  . RIGHT/LEFT HEART CATH AND CORONARY ANGIOGRAPHY N/A 06/13/2018   Procedure: RIGHT/LEFT HEART CATH AND CORONARY ANGIOGRAPHY;  Surgeon: Martinique,  Peter M, MD;  Location: [facility];  Service: Cardiovascular;  Laterality: N/A;   Family History:  Family History  Problem Relation Age of Onset  . CAD Paternal Grandmother   . Diabetes Mellitus II Neg Hx    Family Psychiatric  History:  Social History:  Social History   Substance and Sexual Activity  Alcohol Use Yes  . Alcohol/week: 6.0 standard drinks  . Types: 6 Cans of beer per week   Comment: daily     Social History   Substance and Sexual Activity  Drug Use Yes  . Types: "Crack" cocaine    Social History   Socioeconomic History  . Marital status: Single    Spouse name: Not on file  . Number of children: Not on file  . Years of education: Not on file  . Highest education level: Not on file  Occupational History  . Not on file  Social Needs  . Financial resource strain: Not on file  . Food insecurity    Worry: Not on file    Inability: Not on file  . Transportation needs    Medical: Not on file    Non-medical: Not on file  Tobacco Use  . Smoking status: Current Every Day Smoker    Packs/day: 1.00    Types: Cigarettes  . Smokeless tobacco: Never Used  Substance and Sexual Activity  . Alcohol use: Yes    Alcohol/week: 6.0 standard drinks    Types: 6 Cans of beer per week    Comment: daily  . Drug use: Yes    Types: "Crack" cocaine  . Sexual activity: Not Currently  Lifestyle  . Physical activity  Days per week: Not on file    Minutes per session: Not on file  . Stress: Not on file  Relationships  . Social Herbalist on phone: Not on file    Gets together: Not on file    Attends religious service: Not on file    Active member of club or organization: Not on file    Attends meetings of clubs or organizations: Not on file    Relationship status: Not on file  Other Topics Concern  . Not on file  Social History Narrative  . Not on file   Additional Social History:   Sleep: Good  Appetite:  Fair  Current  Medications: Current Facility-Administered Medications  Medication Dose Route Frequency Provider Last Rate Last Dose  . acetaminophen (TYLENOL) tablet 650 mg  650 mg Oral Q6H PRN Deloria Lair, NP   650 mg at 10/05/18 2155  . carvedilol (COREG) tablet 3.125 mg  3.125 mg Oral BID WC Sharma Covert, MD   3.125 mg at 10/06/18 1004  . FLUoxetine (PROZAC) capsule 10 mg  10 mg Oral Daily Sharma Covert, MD   10 mg at 10/06/18 1004  . folic acid (FOLVITE) tablet 1 mg  1 mg Oral Daily Sharma Covert, MD   1 mg at 10/06/18 1004  . furosemide (LASIX) tablet 40 mg  40 mg Oral Daily Sharma Covert, MD   40 mg at 10/06/18 1005  . insulin aspart (novoLOG) injection 0-9 Units  0-9 Units Subcutaneous TID WC Sharma Covert, MD      . LORazepam (ATIVAN) tablet 1 mg  1 mg Oral Q6H PRN Sharma Covert, MD      . OLANZapine zydis (ZYPREXA) disintegrating tablet 10 mg  10 mg Oral Q8H PRN Sharma Covert, MD       And  . LORazepam (ATIVAN) tablet 1 mg  1 mg Oral PRN Sharma Covert, MD       And  . ziprasidone (GEODON) injection 20 mg  20 mg Intramuscular PRN Sharma Covert, MD      . neomycin-bacitracin-polymyxin (NEOSPORIN) ointment   Topical TID Sharma Covert, MD      . nicotine polacrilex (NICORETTE) gum 2 mg  2 mg Oral PRN Sharma Covert, MD   2 mg at 10/06/18 1017  . pantoprazole (PROTONIX) EC tablet 40 mg  40 mg Oral Daily Sharma Covert, MD   40 mg at 10/06/18 1005  . QUEtiapine (SEROQUEL) tablet 50 mg  50 mg Oral QHS Sharma Covert, MD   50 mg at 10/05/18 2107  . sacubitril-valsartan (ENTRESTO) 24-26 mg per tablet  1 tablet Oral BID Sharma Covert, MD   1 tablet at 10/06/18 1005  . thiamine (VITAMIN B-1) tablet 100 mg  100 mg Oral Daily Sharma Covert, MD   100 mg at 10/06/18 1004    Lab Results:  Results for orders placed or performed during the hospital encounter of 10/05/18 (from the past 48 hour(s))  Glucose, capillary     Status: None    Collection Time: 10/05/18  5:00 PM  Result Value Ref Range   Glucose-Capillary 98 70 - 99 mg/dL  Glucose, capillary     Status: Abnormal   Collection Time: 10/05/18  8:36 PM  Result Value Ref Range   Glucose-Capillary 228 (H) 70 - 99 mg/dL   Comment 1 Notify RN   Glucose, capillary     Status: None  Collection Time: 10/06/18  5:47 AM  Result Value Ref Range   Glucose-Capillary 84 70 - 99 mg/dL  Lipid panel     Status: None   Collection Time: 10/06/18  6:30 AM  Result Value Ref Range   Cholesterol 160 0 - 200 mg/dL   Triglycerides 67 <150 mg/dL   HDL 52 >40 mg/dL   Total CHOL/HDL Ratio 3.1 RATIO   VLDL 13 0 - 40 mg/dL   LDL Cholesterol 95 0 - 99 mg/dL    Comment:        Total Cholesterol/HDL:CHD Risk Coronary Heart Disease Risk Table                     Men   Women  1/2 Average Risk   3.4   3.3  Average Risk       5.0   4.4  2 X Average Risk   9.6   7.1  3 X Average Risk  23.4   11.0        Use the calculated Patient Ratio above and the CHD Risk Table to determine the patient's CHD Risk.        ATP III CLASSIFICATION (LDL):  <100     mg/dL   Optimal  100-129  mg/dL   Near or Above                    Optimal  130-159  mg/dL   Borderline  160-189  mg/dL   High  >190     mg/dL   Very High Performed at [facility] [address], [place], [place] [address]   TSH     Status: None   Collection Time: 10/06/18  6:30 AM  Result Value Ref Range   TSH 1.166 0.350 - 4.500 uIU/mL    Comment: Performed by a 3rd Generation assay with a functional sensitivity of <=0.01 uIU/mL. Performed at [facility], [place] [address] [place], [place] [address]   Brain natriuretic peptide     Status: Abnormal   Collection Time: 10/06/18  6:30 AM  Result Value Ref Range   B Natriuretic Peptide 1,633.0 (H) 0.0 - 100.0 pg/mL    Comment: Performed at [facility], [place] [address] [place], [place] [address]  Basic metabolic panel      Status: Abnormal   Collection Time: 10/06/18  6:30 AM  Result Value Ref Range   Sodium 143 135 - 145 mmol/L   Potassium 3.7 3.5 - 5.1 mmol/L   Chloride 106 98 - 111 mmol/L   CO2 28 22 - 32 mmol/L   Glucose, Bld 103 (H) 70 - 99 mg/dL   BUN 17 6 - 20 mg/dL   Creatinine, Ser 1.14 0.61 - 1.24 mg/dL   Calcium 8.9 8.9 - 10.3 mg/dL   GFR calc non Af Amer >60 >60 mL/min   GFR calc Af Amer >60 >60 mL/min   Anion gap 9 5 - 15    Comment: Performed at [facility], [place] [address], [place], [place] [address]  Glucose, capillary     Status: Abnormal   Collection Time: 10/06/18 12:01 PM  Result Value Ref Range   Glucose-Capillary 149 (H) 70 - 99 mg/dL   Comment 1 Notify RN    Comment 2 Document in Chart     Blood Alcohol level:  Lab Results  Component Value Date   ETH <10 10/04/2018   ETH 59 (H) 04/17/2013  Metabolic Disorder Labs: Lab Results  Component Value Date   HGBA1C 6.3 (H) 06/11/2018   MPG 134.11 06/11/2018   No results found for: PROLACTIN Lab Results  Component Value Date   CHOL 160 10/06/2018   TRIG 67 10/06/2018   HDL 52 10/06/2018   CHOLHDL 3.1 10/06/2018   VLDL 13 10/06/2018   LDLCALC 95 10/06/2018    Physical Findings: AIMS: Facial and Oral Movements Muscles of Facial Expression: None, normal Lips and Perioral Area: None, normal Jaw: None, normal Tongue: None, normal,Extremity Movements Upper (arms, wrists, hands, fingers): None, normal Lower (legs, knees, ankles, toes): None, normal, Trunk Movements Neck, shoulders, hips: None, normal, Overall Severity Severity of abnormal movements (highest score from questions above): None, normal Incapacitation due to abnormal movements: None, normal Patient's awareness of abnormal movements (rate only patient's report): No Awareness, Dental Status Current problems with teeth and/or dentures?: No Does patient usually wear dentures?: No  CIWA:    COWS:     Musculoskeletal: Strength & Muscle  Tone: within normal limits Gait & Station: normal Patient leans: N/A  Psychiatric Specialty Exam: Physical Exam  ROS denies chest pain or dyspnea, no shortness of breath at room air noted or reported, no vomiting   Blood pressure 110/70, pulse (!) 51, temperature 98.8 F (37.1 C), temperature source Oral, resp. rate 16, height 6' (1.829 m), weight 84.8 kg, SpO2 100 %.Body mass index is 25.36 kg/m.  General Appearance: Fairly Groomed  Eye Contact:  Fair- improves partially during session  Speech:  normal  Volume:  Decreased  Mood:  reports some improvement , presents depressed   Affect:  constricted, smiles briefly at times   Thought Process:  Linear and Descriptions of Associations: Intact  Orientation:  Other:  alert and attentive  Thought Content:  reports auditory hallucinations which have decreased, currently does not appear internally preoccupied, no delusions are expressed at this time  Suicidal Thoughts:  No denies suicidal plan or intention at this time and contracts for safety, denies HI   Homicidal Thoughts:  No  Memory:  recent and remote grossly intact   Judgement:  Fair  Insight:  Fair  Psychomotor Activity:  Decreased- does not appear to be in any acute distress , no restlessness   Concentration:  Concentration: Fair and Attention Span: Fair  Recall:  [employer] of Knowledge:  Good  Language:  Good  Akathisia:  Negative  Handed:  Right  AIMS (if indicated):     Assets:  Communication Skills Desire for Improvement Resilience  ADL's:  Intact  Cognition:  WNL  Sleep:  Number of Hours: 6.25   Assessment -  53 year old male, lives in [place]. He  presented for depression, suicidal ideations, self injurious behaviors ( head banging) , auditory hallucinations. Has been diagnosed with Schizoaffective Disorder . Was not taking psychiatric medications prior to admission. He has a history of Cardiac Disease and has history of CHF/low Ejection Fraction.  Today  patient reports some improvement compared to admission. Affect is constricted, blunted, but does smile briefly at times. Reports decreased auditory hallucinations and does not appear internally preoccupied and no delusions are expressed. QTc 481 today. Thus far tolerating Prozac and Seroquel well . B Natriuretic Peptide level is increased - patient has a history of CHF. I have consulted Hospitalist for recommendations.   Treatment Plan Summary: Daily contact with patient to assess and evaluate symptoms and progress in treatment, Medication management, Plan inpatient treatment  and medications as below Encourage  group and milieu participation Encourage abstinence, sobriety efforts  Continue Seroquel 50 mgrs QHS for mood disorder, psychosis Continue Prozac 10 mgrs QDAY for depression Continue Coreg 3.125 mgrs BID,Lasix 40 mgrs QDAY, Entresto  24/26 BID for history of CAD/CHF  Continue Ativan PRN for alcohol WDL as needed  Continue Protonix 40 mgrs QDAY for gastric protection Treatment team working on disposition planning options .   Jenne Campus, MD 10/06/2018, 12:54 PM

## 2018-10-06 NOTE — Progress Notes (Addendum)
Received a call from Dr. Parke Hamilton about this patient who was admitted overnight with suicidal ideation repetitive behavior head-banging etc. It looks like he has a history of severe systolic heart failure with a EF of 15 Screening labs (patient was asymptomatic per Dr. Parke Hamilton) seems to reveal that BNP is elevated to 1633  Elevated BNP portends increased mortality however I noticed that on his last visit to his cardiologist Dr. Debara Hamilton his weight was 80 kg and today is 64  labs are entirely unremarkable other than his BNP--some evidence for tracking this in the outpatient setting for mortality prognostication but as an inpatient would leave well enough alone and not check again unless he is symptomatic   I discussed with Dr. Parke Hamilton who feels comfortable with medical recommendations as below I have increased his Lasix from 40 once daily orally to 40 in the morning and 20 in the evening He will continue his Coreg and his Entresto-low blood pressures preclude titration of Entresto per Dr. Lysbeth Hamilton note  I am available as always if needed for further medical assistance and can see the patient if felt needed  Kerry Griffes, MD [facility] 1:38 PM

## 2018-10-06 NOTE — [facility] Group Notes (Signed)
[facility] Group Notes:  (Nursing/MHT/Case Management/Adjunct)  Date:  10/06/2018  Time:  10:00 AM  Type of Therapy:  Nurse Education  Participation Level:  Active  Participation Quality:  Appropriate, Attentive and Sharing  Affect:  Appropriate  Cognitive:  Alert and Appropriate  Insight:  Appropriate and Good  Engagement in Group:  Developing/Improving, Engaged, Improving and Supportive  Modes of Intervention:  Discussion, Education, Exploration and Support  Summary of Progress/Problems: Pt's discussed their own setbacks in recovery with this admission and possibly past. Pt's discussed triggers that have been present before setbacks in recovery have occurred. Lastly, pt's discussed their goal for the day and how this and present coping skills can relate to progressing past this setback. Pt shared multiple times with their goal, how they were using this to progress, what triggers set them back in the present/past, and how they could cope with this in the future as to avoid another setback.  Otelia Limes Jaleiah Asay 10/06/2018, 11:01 AM

## 2018-10-06 NOTE — Tx Team (Signed)
Interdisciplinary Treatment and Diagnostic Plan Update  10/06/2018 Time of Session: 830a Kerry Hamilton MRN: [id number]  Principal Diagnosis: <principal problem not specified>  Secondary Diagnoses: Active Problems:   Schizoaffective disorder (H[facility])   Current Medications:  Current Facility-Administered Medications  Medication Dose Route Frequency Provider Last Rate Last Dose  . acetaminophen (TYLENOL) tablet 650 mg  650 mg Oral Q6H PRN Deloria Lair, NP   650 mg at 10/05/18 2155  . carvedilol (COREG) tablet 3.125 mg  3.125 mg Oral BID WC Sharma Covert, MD   3.125 mg at 10/06/18 1004  . FLUoxetine (PROZAC) capsule 10 mg  10 mg Oral Daily Sharma Covert, MD   10 mg at 10/06/18 1004  . folic acid (FOLVITE) tablet 1 mg  1 mg Oral Daily Sharma Covert, MD   1 mg at 10/06/18 1004  . furosemide (LASIX) tablet 40 mg  40 mg Oral Daily Sharma Covert, MD   40 mg at 10/06/18 1005  . insulin aspart (novoLOG) injection 0-9 Units  0-9 Units Subcutaneous TID WC Sharma Covert, MD      . LORazepam (ATIVAN) tablet 1 mg  1 mg Oral Q6H PRN Sharma Covert, MD      . OLANZapine zydis (ZYPREXA) disintegrating tablet 10 mg  10 mg Oral Q8H PRN Sharma Covert, MD       And  . LORazepam (ATIVAN) tablet 1 mg  1 mg Oral PRN Sharma Covert, MD       And  . ziprasidone (GEODON) injection 20 mg  20 mg Intramuscular PRN Sharma Covert, MD      . neomycin-bacitracin-polymyxin (NEOSPORIN) ointment   Topical TID Sharma Covert, MD      . nicotine polacrilex (NICORETTE) gum 2 mg  2 mg Oral PRN Sharma Covert, MD   2 mg at 10/06/18 0258  . pantoprazole (PROTONIX) EC tablet 40 mg  40 mg Oral Daily Sharma Covert, MD   40 mg at 10/06/18 1005  . QUEtiapine (SEROQUEL) tablet 50 mg  50 mg Oral QHS Sharma Covert, MD   50 mg at 10/05/18 2107  . sacubitril-valsartan (ENTRESTO) 24-26 mg per tablet  1 tablet Oral BID Sharma Covert, MD   1 tablet at 10/06/18 1005  . thiamine  (VITAMIN B-1) tablet 100 mg  100 mg Oral Daily Sharma Covert, MD   100 mg at 10/06/18 1004   PTA Medications: Medications Prior to Admission  Medication Sig Dispense Refill Last Dose  . carvedilol (COREG) 3.125 MG tablet Take 1 tablet (3.125 mg total) by mouth 2 (two) times daily with a meal. (Patient not taking: Reported on 10/05/2018) 60 tablet 2   . furosemide (LASIX) 40 MG tablet Take 1 tablet (40 mg total) by mouth daily. (Patient not taking: Reported on 10/05/2018) 30 tablet 0   . pantoprazole (PROTONIX) 40 MG tablet Take 1 tablet (40 mg total) by mouth daily. (Patient not taking: Reported on 10/05/2018) 30 tablet 0 Not Taking at Unknown time    Patient Stressors: Health problems Medication change or noncompliance Substance abuse  Patient Strengths: Active sense of humor Motivation for treatment/growth  Treatment Modalities: Medication Management, Group therapy, Case management,  1 to 1 session with clinician, Psychoeducation, Recreational therapy.   Physician Treatment Plan for Primary Diagnosis: <principal problem not specified> Long Term Goal(s): Improvement in symptoms so as ready for discharge Improvement in symptoms so as ready for discharge   Short Term Goals: Ability  to identify changes in lifestyle to reduce recurrence of condition will improve Ability to verbalize feelings will improve Ability to disclose and discuss suicidal ideas Ability to demonstrate self-control will improve Ability to identify and develop effective coping behaviors will improve Ability to maintain clinical measurements within normal limits will improve Compliance with prescribed medications will improve Ability to identify triggers associated with substance abuse/mental health issues will improve Ability to identify changes in lifestyle to reduce recurrence of condition will improve Ability to verbalize feelings will improve Ability to disclose and discuss suicidal ideas Ability to  demonstrate self-control will improve Ability to identify and develop effective coping behaviors will improve Ability to maintain clinical measurements within normal limits will improve Compliance with prescribed medications will improve Ability to identify triggers associated with substance abuse/mental health issues will improve  Medication Management: Evaluate patient's response, side effects, and tolerance of medication regimen.  Therapeutic Interventions: 1 to 1 sessions, Unit Group sessions and Medication administration.  Evaluation of Outcomes: Not Met  Physician Treatment Plan for Secondary Diagnosis: Active Problems:   Schizoaffective disorder ([place])  Long Term Goal(s): Improvement in symptoms so as ready for discharge Improvement in symptoms so as ready for discharge   Short Term Goals: Ability to identify changes in lifestyle to reduce recurrence of condition will improve Ability to verbalize feelings will improve Ability to disclose and discuss suicidal ideas Ability to demonstrate self-control will improve Ability to identify and develop effective coping behaviors will improve Ability to maintain clinical measurements within normal limits will improve Compliance with prescribed medications will improve Ability to identify triggers associated with substance abuse/mental health issues will improve Ability to identify changes in lifestyle to reduce recurrence of condition will improve Ability to verbalize feelings will improve Ability to disclose and discuss suicidal ideas Ability to demonstrate self-control will improve Ability to identify and develop effective coping behaviors will improve Ability to maintain clinical measurements within normal limits will improve Compliance with prescribed medications will improve Ability to identify triggers associated with substance abuse/mental health issues will improve     Medication Management: Evaluate patient's response, side  effects, and tolerance of medication regimen.  Therapeutic Interventions: 1 to 1 sessions, Unit Group sessions and Medication administration.  Evaluation of Outcomes: Not Met   RN Treatment Plan for Primary Diagnosis: <principal problem not specified> Long Term Goal(s): Knowledge of disease and therapeutic regimen to maintain health will improve  Short Term Goals: Ability to participate in decision making will improve, Ability to disclose and discuss suicidal ideas, Ability to identify and develop effective coping behaviors will improve and Compliance with prescribed medications will improve  Medication Management: RN will administer medications as ordered by provider, will assess and evaluate patient's response and provide education to patient for prescribed medication. RN will report any adverse and/or side effects to prescribing provider.  Therapeutic Interventions: 1 on 1 counseling sessions, Psychoeducation, Medication administration, Evaluate responses to treatment, Monitor vital signs and CBGs as ordered, Perform/monitor CIWA, COWS, AIMS and Fall Risk screenings as ordered, Perform wound care treatments as ordered.  Evaluation of Outcomes: Not Met   LCSW Treatment Plan for Primary Diagnosis: <principal problem not specified> Long Term Goal(s): Safe transition to appropriate next level of care at discharge, Engage patient in therapeutic group addressing interpersonal concerns.  Short Term Goals: Engage patient in aftercare planning with referrals and resources  Therapeutic Interventions: Assess for all discharge needs, 1 to 1 time with Social worker, Explore available resources and support systems, Assess for adequacy in  community support network, Educate family and significant other(s) on suicide prevention, Complete Psychosocial Assessment, Interpersonal group therapy.  Evaluation of Outcomes: Not Met   Progress in Treatment: Attending groups: No. Participating in groups:  No. Taking medication as prescribed: Yes. Toleration medication: Yes. Family/Significant other contact made: No, will contact:  pt declined Patient understands diagnosis: Yes. Discussing patient identified problems/goals with staff: Yes. Medical problems stabilized or resolved: No. Denies suicidal/homicidal ideation: No. Issues/concerns per patient self-inventory: No. Other: NA  New problem(s) identified: No, Describe:  none reported  New Short Term/Long Term Goal(s):  Patient Goals: "I want to try to straighten my life out. I got court in Oct and Nov."   Discharge Plan or Barriers:   Reason for Continuation of Hospitalization: Anxiety Medication stabilization Suicidal ideation  Estimated Length of Stay: 3-5 days  Attendees: Patient: 10/06/2018 11:39 AM  Physician: Neita Garnet 10/06/2018 11:39 AM  Nursing: Benjamine Mola A 10/06/2018 11:39 AM  RN Care Manager: 10/06/2018 11:39 AM  Social Worker: Sanjuana Kava 10/06/2018 11:39 AM  Recreational Therapist:  10/06/2018 11:39 AM  Other:  10/06/2018 11:39 AM  Other:  10/06/2018 11:39 AM  Other: 10/06/2018 11:39 AM    Scribe for Treatment Team: Yvette Rack, LCSW 10/06/2018 11:39 AM

## 2018-10-07 LAB — GLUCOSE, CAPILLARY
Glucose-Capillary: 86 mg/dL (ref 70–99)
Glucose-Capillary: 140 mg/dL — ABNORMAL HIGH (ref 70–99)
Glucose-Capillary: 213 mg/dL — ABNORMAL HIGH (ref 70–99)
Glucose-Capillary: 164 mg/dL — ABNORMAL HIGH (ref 70–99)

## 2018-10-07 LAB — HEMOGLOBIN A1C
Hgb A1c MFr Bld: 8.1 % — ABNORMAL HIGH (ref 4.8–5.6)
Mean Plasma Glucose: 186 mg/dL

## 2018-10-07 MED ORDER — FLUOXETINE HCL 20 MG PO CAPS
20.0000 mg | ORAL_CAPSULE | Freq: Every day | ORAL | Status: DC
  Administered 2018-10-08 – 2018-10-09 (×2): 20 mg via ORAL
  Filled 2018-10-07 (×5): qty 1

## 2018-10-07 NOTE — Progress Notes (Signed)
D: Patient has been intrusive with staff today.  He hovers around the nurses station asking for coffee and nicorette gum.  He is cooperative with staff today.  He denies any thoughts of self harm.  Patient's goal today is to "think positive."  He rates his depression as a 6; hopelessness as an 8; and anxiety as a 9.    A: Continue to monitor medication management and MD orders.  Safety checks completed every 15 minutes per protocol.  Offer support and encouragement as needed.  R: Patient is receptive to staff; his behavior is appropriate.

## 2018-10-07 NOTE — [facility] Group Notes (Signed)
[facility] Notes: (Clinical Social Work)   10/07/2018      Type of Therapy:  Group Therapy   Participation Level:  Did Not Attend - was invited both individually by [facility] and by overhead announcement, chose not to attend.   Selmer Dominion, LCSW 10/07/2018, 11:31 AM

## 2018-10-07 NOTE — Progress Notes (Addendum)
[facility] MD Progress Note  10/07/2018 2:25 PM Kerry Hamilton  MRN:  [id number] Subjective: Patient reports "I guess I am all right".  Currently denies suicidal ideations.  Denies medication side effects. Objective : I have reviewed chart notes and have met with patient. 53 year old male, lives in [place]. He  presented for depression, suicidal ideations, self injurious behaviors ( head banging) , auditory hallucinations. Has been diagnosed with Schizoaffective Disorder . History of Alcohol and Cocaine Use Disorder.  Admission BAL negative .  Was not taking psychiatric medications prior to admission. He has a history of Cardiac Disease and has history of CHF/low Ejection Fraction.  Patient presents with a depressed/constricted affect although tends to improve during session.  Smiles briefly at times.  Currently denies suicidal ideations.  Visible on unit/dayroom.  Staff report indicates he has been more interactive with peers.  Has been going to some groups. Currently denies suicidal ideations.  Does endorse some lingering depression and anhedonia states "I feel like I am just existing sometimes".  Currently denies hallucinations and does not appear internally preoccupied.  No disruptive or agitated behaviors, polite on approach.  Patient has a history of CHF with low ejection fraction.  Currently does not endorse worsening edema, orthopnea, nocturnal paroxystic dyspnea.  Denies medication side effects. CBG today 140.   Principal Problem:  Schizoaffective Disorder  Diagnosis: Active Problems:   Schizoaffective disorder ([place])  Total Time spent with patient: 20 minutes  Past Psychiatric History:   Past Medical History:  Past Medical History:  Diagnosis Date  . Depression   . Diabetes mellitus without complication [facility])     Past Surgical History:  Procedure Laterality Date  . RIGHT/LEFT HEART CATH AND CORONARY ANGIOGRAPHY N/A 06/13/2018   Procedure: RIGHT/LEFT HEART CATH AND CORONARY  ANGIOGRAPHY;  Surgeon: Martinique, Peter M, MD;  Location: [facility];  Service: Cardiovascular;  Laterality: N/A;   Family History:  Family History  Problem Relation Age of Onset  . CAD Paternal Grandmother   . Diabetes Mellitus II Neg Hx    Family Psychiatric  History:  Social History:  Social History   Substance and Sexual Activity  Alcohol Use Yes  . Alcohol/week: 6.0 standard drinks  . Types: 6 Cans of beer per week   Comment: daily     Social History   Substance and Sexual Activity  Drug Use Yes  . Types: "Crack" cocaine    Social History   Socioeconomic History  . Marital status: Single    Spouse name: Not on file  . Number of children: Not on file  . Years of education: Not on file  . Highest education level: Not on file  Occupational History  . Not on file  Social Needs  . Financial resource strain: Not on file  . Food insecurity    Worry: Not on file    Inability: Not on file  . Transportation needs    Medical: Not on file    Non-medical: Not on file  Tobacco Use  . Smoking status: Current Every Day Smoker    Packs/day: 1.00    Types: Cigarettes  . Smokeless tobacco: Never Used  Substance and Sexual Activity  . Alcohol use: Yes    Alcohol/week: 6.0 standard drinks    Types: 6 Cans of beer per week    Comment: daily  . Drug use: Yes    Types: "Crack" cocaine  . Sexual activity: Not Currently  Lifestyle  . Physical activity  Days per week: Not on file    Minutes per session: Not on file  . Stress: Not on file  Relationships  . Social Herbalist on phone: Not on file    Gets together: Not on file    Attends religious service: Not on file    Active member of club or organization: Not on file    Attends meetings of clubs or organizations: Not on file    Relationship status: Not on file  Other Topics Concern  . Not on file  Social History Narrative  . Not on file   Additional Social History:   Sleep: Good  Appetite:   Fair  Current Medications: Current Facility-Administered Medications  Medication Dose Route Frequency Provider Last Rate Last Dose  . acetaminophen (TYLENOL) tablet 650 mg  650 mg Oral Q6H PRN Deloria Lair, NP   650 mg at 10/07/18 0759  . carvedilol (COREG) tablet 3.125 mg  3.125 mg Oral BID WC Sharma Covert, MD   3.125 mg at 10/07/18 0756  . FLUoxetine (PROZAC) capsule 10 mg  10 mg Oral Daily Sharma Covert, MD   10 mg at 10/07/18 0757  . folic acid (FOLVITE) tablet 1 mg  1 mg Oral Daily Sharma Covert, MD   1 mg at 10/07/18 0757  . furosemide (LASIX) tablet 20 mg  20 mg Oral Q2200 Nita Sells, MD   20 mg at 10/06/18 2106  . furosemide (LASIX) tablet 40 mg  40 mg Oral Daily Sharma Covert, MD   40 mg at 10/07/18 0757  . insulin aspart (novoLOG) injection 0-9 Units  0-9 Units Subcutaneous TID WC Sharma Covert, MD   1 Units at 10/07/18 1205  . LORazepam (ATIVAN) tablet 1 mg  1 mg Oral Q6H PRN Sharma Covert, MD      . OLANZapine zydis [facility]) disintegrating tablet 10 mg  10 mg Oral Q8H PRN Sharma Covert, MD       And  . LORazepam (ATIVAN) tablet 1 mg  1 mg Oral PRN Sharma Covert, MD       And  . ziprasidone (GEODON) injection 20 mg  20 mg Intramuscular PRN Sharma Covert, MD      . neomycin-bacitracin-polymyxin (NEOSPORIN) ointment   Topical TID Sharma Covert, MD      . nicotine polacrilex (NICORETTE) gum 2 mg  2 mg Oral PRN Sharma Covert, MD   2 mg at 10/06/18 7253  . pantoprazole (PROTONIX) EC tablet 40 mg  40 mg Oral Daily Sharma Covert, MD   40 mg at 10/07/18 0757  . QUEtiapine (SEROQUEL) tablet 50 mg  50 mg Oral QHS Sharma Covert, MD   50 mg at 10/06/18 2106  . sacubitril-valsartan (ENTRESTO) 24-26 mg per tablet  1 tablet Oral BID Sharma Covert, MD   1 tablet at 10/07/18 0757  . thiamine (VITAMIN B-1) tablet 100 mg  100 mg Oral Daily Sharma Covert, MD   100 mg at 10/07/18 0757    Lab Results:  Results  for orders placed or performed during the hospital encounter of 10/05/18 (from the past 48 hour(s))  Glucose, capillary     Status: None   Collection Time: 10/05/18  5:00 PM  Result Value Ref Range   Glucose-Capillary 98 70 - 99 mg/dL  Glucose, capillary     Status: Abnormal   Collection Time: 10/05/18  8:36 PM  Result Value Ref Range  Glucose-Capillary 228 (H) 70 - 99 mg/dL   Comment 1 Notify RN   Glucose, capillary     Status: None   Collection Time: 10/06/18  5:47 AM  Result Value Ref Range   Glucose-Capillary 84 70 - 99 mg/dL  Hemoglobin A1c     Status: Abnormal   Collection Time: 10/06/18  6:30 AM  Result Value Ref Range   Hgb A1c MFr Bld 8.1 (H) 4.8 - 5.6 %    Comment: (NOTE)         Prediabetes: 5.7 - 6.4         Diabetes: >6.4         Glycemic control for adults with diabetes: <7.0    Mean Plasma Glucose 186 mg/dL    Comment: (NOTE) Performed At: [facility] [place], [place] [id number] Rush Farmer MD TO:[id number]   Lipid panel     Status: None   Collection Time: 10/06/18  6:30 AM  Result Value Ref Range   Cholesterol 160 0 - 200 mg/dL   Triglycerides 67 <150 mg/dL   HDL 52 >40 mg/dL   Total CHOL/HDL Ratio 3.1 RATIO   VLDL 13 0 - 40 mg/dL   LDL Cholesterol 95 0 - 99 mg/dL    Comment:        Total Cholesterol/HDL:CHD Risk Coronary Heart Disease Risk Table                     Men   Women  1/2 Average Risk   3.4   3.3  Average Risk       5.0   4.4  2 X Average Risk   9.6   7.1  3 X Average Risk  23.4   11.0        Use the calculated Patient Ratio above and the CHD Risk Table to determine the patient's CHD Risk.        ATP III CLASSIFICATION (LDL):  <100     mg/dL   Optimal  100-129  mg/dL   Near or Above                    Optimal  130-159  mg/dL   Borderline  160-189  mg/dL   High  >190     mg/dL   Very High Performed at [facility] [address], [place], [place] [address]   TSH     Status: None    Collection Time: 10/06/18  6:30 AM  Result Value Ref Range   TSH 1.166 0.350 - 4.500 uIU/mL    Comment: Performed by a 3rd Generation assay with a functional sensitivity of <=0.01 uIU/mL. Performed at [facility], [place] [address] [place], [place] [address]   Brain natriuretic peptide     Status: Abnormal   Collection Time: 10/06/18  6:30 AM  Result Value Ref Range   B Natriuretic Peptide 1,633.0 (H) 0.0 - 100.0 pg/mL    Comment: Performed at [facility], [place] [address] [place], [place] [address]  Basic metabolic panel     Status: Abnormal   Collection Time: 10/06/18  6:30 AM  Result Value Ref Range   Sodium 143 135 - 145 mmol/L   Potassium 3.7 3.5 - 5.1 mmol/L   Chloride 106 98 - 111 mmol/L   CO2 28 22 - 32 mmol/L   Glucose, Bld 103 (H) 70 - 99 mg/dL   BUN 17 6 - 20 mg/dL  Creatinine, Ser 1.14 0.61 - 1.24 mg/dL   Calcium 8.9 8.9 - 10.3 mg/dL   GFR calc non Af Amer >60 >60 mL/min   GFR calc Af Amer >60 >60 mL/min   Anion gap 9 5 - 15    Comment: Performed at [facility], [place] [address] [place], [place] [address]  Glucose, capillary     Status: Abnormal   Collection Time: 10/06/18 12:01 PM  Result Value Ref Range   Glucose-Capillary 149 (H) 70 - 99 mg/dL   Comment 1 Notify RN    Comment 2 Document in Chart   Glucose, capillary     Status: Abnormal   Collection Time: 10/06/18  8:06 PM  Result Value Ref Range   Glucose-Capillary 140 (H) 70 - 99 mg/dL  Glucose, capillary     Status: None   Collection Time: 10/07/18  5:58 AM  Result Value Ref Range   Glucose-Capillary 86 70 - 99 mg/dL   Comment 1 Notify RN    Comment 2 Document in Chart   Glucose, capillary     Status: Abnormal   Collection Time: 10/07/18 12:03 PM  Result Value Ref Range   Glucose-Capillary 140 (H) 70 - 99 mg/dL    Blood Alcohol level:  Lab Results  Component Value Date   ETH <10 10/04/2018   ETH 59 (H) 22/02/5425    Metabolic Disorder  Labs: Lab Results  Component Value Date   HGBA1C 8.1 (H) 10/06/2018   MPG 186 10/06/2018   MPG 134.11 06/11/2018   No results found for: PROLACTIN Lab Results  Component Value Date   CHOL 160 10/06/2018   TRIG 67 10/06/2018   HDL 52 10/06/2018   CHOLHDL 3.1 10/06/2018   VLDL 13 10/06/2018   LDLCALC 95 10/06/2018    Physical Findings: AIMS: Facial and Oral Movements Muscles of Facial Expression: None, normal Lips and Perioral Area: None, normal Jaw: None, normal Tongue: None, normal,Extremity Movements Upper (arms, wrists, hands, fingers): None, normal Lower (legs, knees, ankles, toes): None, normal, Trunk Movements Neck, shoulders, hips: None, normal, Overall Severity Severity of abnormal movements (highest score from questions above): None, normal Incapacitation due to abnormal movements: None, normal Patient's awareness of abnormal movements (rate only patient's report): No Awareness, Dental Status Current problems with teeth and/or dentures?: No Does patient usually wear dentures?: No  CIWA:    COWS:     Musculoskeletal: Strength & Muscle Tone: within normal limits Gait & Station: normal Patient leans: N/A  Psychiatric Specialty Exam: Physical Exam  ROS denies chest pain or dyspnea, no shortness of breath at room air noted or reported, no vomiting   Blood pressure 126/76, pulse (!) 53, temperature 97.9 F (36.6 C), temperature source Oral, resp. rate 16, height 6' (1.829 m), weight 84.8 kg, SpO2 100 %.Body mass index is 25.36 kg/m.  General Appearance: Improving grooming  Eye Contact: Improving  Speech:  normal  Volume:  Normal  Mood:  Partially improved mood  Affect:  Remains constricted although affect does become more reactive during session  Thought Process:  Linear and Descriptions of Associations: Intact  Orientation:  Other:  alert and attentive  Thought Content:  Today denies hallucinations and does not appear internally preoccupied at this time.  No  delusions are expressed  Suicidal Thoughts:  No denies suicidal plan or intention at this time and contracts for safety, denies HI   Homicidal Thoughts:  No  Memory:  recent and remote grossly intact   Judgement:  Fair/improving  Insight:  Fair  Psychomotor Activity:  Normal  Concentration:  Concentration: Good and Attention Span: Good  Recall:  Good  Fund of Knowledge:  Good  Language:  Good  Akathisia:  Negative  Handed:  Right  AIMS (if indicated):     Assets:  Communication Skills Desire for Improvement Resilience  ADL's:  Intact  Cognition:  WNL  Sleep:  Number of Hours: 6.25   Assessment -  53 year old male, lives in [place]. He  presented for depression, suicidal ideations, self injurious behaviors ( head banging) , auditory hallucinations. Has been diagnosed with Schizoaffective Disorder . Was not taking psychiatric medications prior to admission. He has a history of Cardiac Disease and has history of CHF/low Ejection Fraction.  At this time patient presents with partial improvement.  Does remain depressed, with a constricted affect, although tends to brighten partially during session.  Currently does not appear internally preoccupied and today denies hallucinations.  Tolerating medications well thus far.  No current symptoms of decompensated CHF and no dyspnea at room air currently.   Treatment Plan Summary: Daily contact with patient to assess and evaluate symptoms and progress in treatment, Medication management, Plan inpatient treatment  and medications as below  Treatment Plan reviewed as below today 9/12 Encourage group and milieu participation Encourage abstinence, sobriety efforts  Continue Seroquel 50 mgrs QHS for mood disorder, psychosis Increase  Prozac to 20 mgrs QDAY for depression Continue Coreg 3.125 mgrs BID,Lasix 40 mgrs QDAY, Entresto  24/26 BID for history of CAD/CHF  Continue Ativan PRN for alcohol WDL as needed  Continue Protonix 40 mgrs QDAY for  gastric protection Treatment team working on disposition planning options .   Jenne Campus, MD 10/07/2018, 2:25 PM   Patient ID: Kerry Hamilton, male   DOB: Jul 17, 1965, 53 y.o.   MRN: [id number]

## 2018-10-07 NOTE — Progress Notes (Signed)
Adult Psychoeducational Group Note  Date:  10/07/2018 Time:  9:56 PM  Group Topic/Focus:  Wrap-Up Group:   The focus of this group is to help patients review their daily goal of treatment and discuss progress on daily workbooks.  Participation Level:  Active  Participation Quality:  Appropriate  Affect:  Appropriate  Cognitive:  Appropriate  Insight: Appropriate  Engagement in Group:  Engaged  Modes of Intervention:  Discussion  Additional Comments:  Pt stated his goal was to have a good and peaceful day.  Pt wants to continue having positive thoughts.  Pt did meet his goals and rated the day at a 7/10.  Torri Michalski 10/07/2018, 9:56 PM

## 2018-10-08 LAB — GLUCOSE, CAPILLARY
Glucose-Capillary: 115 mg/dL — ABNORMAL HIGH (ref 70–99)
Glucose-Capillary: 131 mg/dL — ABNORMAL HIGH (ref 70–99)
Glucose-Capillary: 191 mg/dL — ABNORMAL HIGH (ref 70–99)
Glucose-Capillary: 161 mg/dL — ABNORMAL HIGH (ref 70–99)

## 2018-10-08 NOTE — Progress Notes (Signed)
[facility] NOVEL CORONAVIRUS (COVID-19) DAILY CHECK-OFF SYMPTOMS - answer yes or no to each - every day NO YES  Have you had a fever in the past 24 hours?  . Fever (Temp > 37.80C / 100F) X   Have you had any of these symptoms in the past 24 hours? . New Cough .  Sore Throat  .  Shortness of Breath .  Difficulty Breathing .  Unexplained Body Aches   X   Have you had any one of these symptoms in the past 24 hours not related to allergies?   . Runny Nose .  Nasal Congestion .  Sneezing   X   If you have had runny nose, nasal congestion, sneezing in the past 24 hours, has it worsened?  X   EXPOSURES - check yes or no X   Have you traveled outside the state in the past 14 days?  X   Have you been in contact with someone with a confirmed diagnosis of COVID-19 or PUI in the past 14 days without wearing appropriate PPE?  X   Have you been living in the same home as a person with confirmed diagnosis of COVID-19 or a PUI (household contact)?    X   Have you been diagnosed with COVID-19?    X              What to do next: Answered NO to all: Answered YES to anything:   Proceed with unit schedule Follow the [facility] Inpatient Flowsheet.   

## 2018-10-08 NOTE — Progress Notes (Signed)
D. Pt friendly upon approach-is calm and cooperative- visible in the milieu- per pt's self inventory, pt rated his depression, hopelessness and anxiety a 9/6/6,respectively. Pt writes that his goal today is "be positive" and "be to myself, maintain".. Pt currently denies SI/HI and AVH and agrees to contact staff before acting on any harmful thoughts.  A. Labs and vitals monitored. Pt given and educated on medications. Pt supported emotionally and encouraged to express concerns and ask questions.   R. Pt remains safe with 15 minute checks. Will continue POC.

## 2018-10-08 NOTE — Progress Notes (Signed)
[facility] MD Progress Note  10/08/2018 11:12 AM Sidney Kann  MRN:  [id number] Subjective: patient reports feeling better than on admission and states he feels " the joy of being alive is coming back". Denies SI, denies hallucinations. Denies medication side effects. Objective : I have reviewed chart notes and have met with patient. 53 year old male, lives in [place]. He  presented for depression, suicidal ideations, self injurious behaviors ( head banging) , auditory hallucinations. Has been diagnosed with Schizoaffective Disorder . History of Alcohol and Cocaine Use Disorder.  Admission BAL negative .  Was not taking psychiatric medications prior to admission. He has a history of Cardiac Disease and has history of CHF/low Ejection Fraction.  He is presenting with improving mood and a fuller range of affect. As above, endorses improved anhedonia and at this time denies SI, presents future oriented. Denies hallucinations and does not appear internally preoccupied. Visible in day room/unit. Pleasant on approach. RN reports patient can be intrusive at times, but responsive to gentle redirection.  Denies medication side effects- on Prozac / low dose Seroquel. Does not endorse dyspnea and is noted to be ambulating unit without shortness of breath. Tolerating Entresto, Lasix ,Coreg well .  Principal Problem:  Schizoaffective Disorder  Diagnosis: Active Problems:   Schizoaffective disorder ([place])  Total Time spent with patient: 20 minutes  Past Psychiatric History:   Past Medical History:  Past Medical History:  Diagnosis Date  . Depression   . Diabetes mellitus without complication [facility])     Past Surgical History:  Procedure Laterality Date  . RIGHT/LEFT HEART CATH AND CORONARY ANGIOGRAPHY N/A 06/13/2018   Procedure: RIGHT/LEFT HEART CATH AND CORONARY ANGIOGRAPHY;  Surgeon: Martinique, Peter M, MD;  Location: [facility];  Service: Cardiovascular;  Laterality: N/A;   Family History:  Family  History  Problem Relation Age of Onset  . CAD Paternal Grandmother   . Diabetes Mellitus II Neg Hx    Family Psychiatric  History:  Social History:  Social History   Substance and Sexual Activity  Alcohol Use Yes  . Alcohol/week: 6.0 standard drinks  . Types: 6 Cans of beer per week   Comment: daily     Social History   Substance and Sexual Activity  Drug Use Yes  . Types: "Crack" cocaine    Social History   Socioeconomic History  . Marital status: Single    Spouse name: Not on file  . Number of children: Not on file  . Years of education: Not on file  . Highest education level: Not on file  Occupational History  . Not on file  Social Needs  . Financial resource strain: Not on file  . Food insecurity    Worry: Not on file    Inability: Not on file  . Transportation needs    Medical: Not on file    Non-medical: Not on file  Tobacco Use  . Smoking status: Current Every Day Smoker    Packs/day: 1.00    Types: Cigarettes  . Smokeless tobacco: Never Used  Substance and Sexual Activity  . Alcohol use: Yes    Alcohol/week: 6.0 standard drinks    Types: 6 Cans of beer per week    Comment: daily  . Drug use: Yes    Types: "Crack" cocaine  . Sexual activity: Not Currently  Lifestyle  . Physical activity    Days per week: Not on file    Minutes per session: Not on file  . Stress:  Not on file  Relationships  . Social Herbalist on phone: Not on file    Gets together: Not on file    Attends religious service: Not on file    Active member of club or organization: Not on file    Attends meetings of clubs or organizations: Not on file    Relationship status: Not on file  Other Topics Concern  . Not on file  Social History Narrative  . Not on file   Additional Social History:   Sleep: Good  Appetite:  Good  Current Medications: Current Facility-Administered Medications  Medication Dose Route Frequency Provider Last Rate Last Dose  .  acetaminophen (TYLENOL) tablet 650 mg  650 mg Oral Q6H PRN Deloria Lair, NP   650 mg at 10/07/18 0759  . carvedilol (COREG) tablet 3.125 mg  3.125 mg Oral BID WC Sharma Covert, MD   3.125 mg at 10/08/18 0831  . FLUoxetine (PROZAC) capsule 20 mg  20 mg Oral Daily Yanely Mast, Myer Peer, MD   20 mg at 10/08/18 0830  . folic acid (FOLVITE) tablet 1 mg  1 mg Oral Daily Sharma Covert, MD   1 mg at 10/08/18 0830  . furosemide (LASIX) tablet 20 mg  20 mg Oral Q2200 Nita Sells, MD   20 mg at 10/07/18 2106  . furosemide (LASIX) tablet 40 mg  40 mg Oral Daily Sharma Covert, MD   40 mg at 10/08/18 0830  . insulin aspart (novoLOG) injection 0-9 Units  0-9 Units Subcutaneous TID WC Sharma Covert, MD   3 Units at 10/07/18 1651  . LORazepam (ATIVAN) tablet 1 mg  1 mg Oral Q6H PRN Sharma Covert, MD      . OLANZapine zydis [facility]) disintegrating tablet 10 mg  10 mg Oral Q8H PRN Sharma Covert, MD       And  . LORazepam (ATIVAN) tablet 1 mg  1 mg Oral PRN Sharma Covert, MD       And  . ziprasidone (GEODON) injection 20 mg  20 mg Intramuscular PRN Sharma Covert, MD      . neomycin-bacitracin-polymyxin (NEOSPORIN) ointment   Topical TID Sharma Covert, MD      . nicotine polacrilex (NICORETTE) gum 2 mg  2 mg Oral PRN Sharma Covert, MD   2 mg at 10/06/18 0867  . pantoprazole (PROTONIX) EC tablet 40 mg  40 mg Oral Daily Sharma Covert, MD   40 mg at 10/08/18 0830  . QUEtiapine (SEROQUEL) tablet 50 mg  50 mg Oral QHS Sharma Covert, MD   50 mg at 10/07/18 2106  . sacubitril-valsartan (ENTRESTO) 24-26 mg per tablet  1 tablet Oral BID Sharma Covert, MD   1 tablet at 10/08/18 0831  . thiamine (VITAMIN B-1) tablet 100 mg  100 mg Oral Daily Sharma Covert, MD   100 mg at 10/08/18 0830    Lab Results:  Results for orders placed or performed during the hospital encounter of 10/05/18 (from the past 48 hour(s))  Glucose, capillary     Status:  Abnormal   Collection Time: 10/06/18 12:01 PM  Result Value Ref Range   Glucose-Capillary 149 (H) 70 - 99 mg/dL   Comment 1 Notify RN    Comment 2 Document in Chart   Glucose, capillary     Status: Abnormal   Collection Time: 10/06/18  8:06 PM  Result Value Ref Range   Glucose-Capillary  140 (H) 70 - 99 mg/dL  Glucose, capillary     Status: None   Collection Time: 10/07/18  5:58 AM  Result Value Ref Range   Glucose-Capillary 86 70 - 99 mg/dL   Comment 1 Notify RN    Comment 2 Document in Chart   Glucose, capillary     Status: Abnormal   Collection Time: 10/07/18 12:03 PM  Result Value Ref Range   Glucose-Capillary 140 (H) 70 - 99 mg/dL  Glucose, capillary     Status: Abnormal   Collection Time: 10/07/18  4:44 PM  Result Value Ref Range   Glucose-Capillary 213 (H) 70 - 99 mg/dL  Glucose, capillary     Status: Abnormal   Collection Time: 10/07/18  8:23 PM  Result Value Ref Range   Glucose-Capillary 164 (H) 70 - 99 mg/dL  Glucose, capillary     Status: Abnormal   Collection Time: 10/08/18  5:57 AM  Result Value Ref Range   Glucose-Capillary 115 (H) 70 - 99 mg/dL    Blood Alcohol level:  Lab Results  Component Value Date   ETH <10 10/04/2018   ETH 59 (H) 60/60/0459    Metabolic Disorder Labs: Lab Results  Component Value Date   HGBA1C 8.1 (H) 10/06/2018   MPG 186 10/06/2018   MPG 134.11 06/11/2018   No results found for: PROLACTIN Lab Results  Component Value Date   CHOL 160 10/06/2018   TRIG 67 10/06/2018   HDL 52 10/06/2018   CHOLHDL 3.1 10/06/2018   VLDL 13 10/06/2018   LDLCALC 95 10/06/2018    Physical Findings: AIMS: Facial and Oral Movements Muscles of Facial Expression: None, normal Lips and Perioral Area: None, normal Jaw: None, normal Tongue: None, normal,Extremity Movements Upper (arms, wrists, hands, fingers): None, normal Lower (legs, knees, ankles, toes): None, normal, Trunk Movements Neck, shoulders, hips: None, normal, Overall  Severity Severity of abnormal movements (highest score from questions above): None, normal Incapacitation due to abnormal movements: None, normal Patient's awareness of abnormal movements (rate only patient's report): No Awareness, Dental Status Current problems with teeth and/or dentures?: No Does patient usually wear dentures?: No  CIWA:    COWS:     Musculoskeletal: Strength & Muscle Tone: within normal limits Gait & Station: normal Patient leans: N/A  Psychiatric Specialty Exam: Physical Exam  ROS denies chest pain or dyspnea, no shortness of breath at room air noted or reported, no vomiting   Blood pressure 137/79, pulse (!) 54, temperature 97.7 F (36.5 C), temperature source Oral, resp. rate 16, height 6' (1.829 m), weight 84.8 kg, SpO2 100 %.Body mass index is 25.36 kg/m.  General Appearance: Improving grooming  Eye Contact: Improving  Speech:  normal  Volume:  Normal  Mood:  improving   Affect:  more reactive  Thought Process:  Linear and Descriptions of Associations: Intact  Orientation:  Other:  alert and attentive  Thought Content:  no hallucinations, not internally preoccupied, no delusions expressed   Suicidal Thoughts:  No denies suicidal plan or intention at this time and contracts for safety, denies HI   Homicidal Thoughts:  No  Memory:  recent and remote grossly intact   Judgement:  improving  Insight:  Fair/ improving  Psychomotor Activity:  Normal  Concentration:  Concentration: Good and Attention Span: Good  Recall:  Good  Fund of Knowledge:  Good  Language:  Good  Akathisia:  Negative  Handed:  Right  AIMS (if indicated):     Assets:  Communication Skills Desire  for Improvement Resilience  ADL's:  Intact  Cognition:  WNL  Sleep:  Number of Hours: 6.75   Assessment -  53 year old male, lives in [place]. He  presented for depression, suicidal ideations, self injurious behaviors ( head banging) , auditory hallucinations. Has been diagnosed  with Schizoaffective Disorder . Was not taking psychiatric medications prior to admission. He has a history of Cardiac Disease and has history of CHF/low Ejection Fraction.  Currently presenting with improving mood , improving neuro-vegetative symptoms, denies psychotic symptoms and does not appear internally preoccupied, tolerating medications well thus far .   Treatment Plan Summary: Daily contact with patient to assess and evaluate symptoms and progress in treatment, Medication management, Plan inpatient treatment  and medications as below  Treatment Plan reviewed as below today 9/13 Encourage group and milieu participation Encourage abstinence, sobriety efforts  Continue Seroquel 50 mgrs QHS for mood disorder, psychosis Continue  Prozac 20 mgrs QDAY for depression Continue Coreg 3.125 mgrs BID,Lasix 40 mgrs QDAY, Entresto  24/26 BID for history of CAD/CHF  Continue Ativan PRN for alcohol WDL as needed  Continue Protonix 40 mgrs QDAY for gastric protection Treatment team working on disposition planning options .   Jenne Campus, MD 10/08/2018, 11:12 AM   Patient ID: Stann Mainland, male   DOB: 20-Jul-1965, 53 y.o.   MRN: [id number]

## 2018-10-08 NOTE — Progress Notes (Signed)
Adult Psychoeducational Group Note  Date:  10/08/2018 Time:  8:36 PM  Group Topic/Focus:  Wrap-Up Group:   The focus of this group is to help patients review their daily goal of treatment and discuss progress on daily workbooks.  Participation Level:  Active  Participation Quality:  Appropriate  Affect:  Appropriate  Cognitive:  Appropriate  Insight: Appropriate  Engagement in Group:  Engaged  Modes of Intervention:  Discussion  Additional Comments:  Patient attended group and said that his day was a 29. His coping skills he used today were watching tv, talking on the phone and socializing.   [name] 1/96/2229, 8:36 PM
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
## 2018-10-09 LAB — GLUCOSE, CAPILLARY
Glucose-Capillary: 92 mg/dL (ref 70–99)
Glucose-Capillary: 170 mg/dL — ABNORMAL HIGH (ref 70–99)

## 2018-10-09 MED ORDER — FLUOXETINE HCL 20 MG PO CAPS: 20.0000 mg | ORAL_CAPSULE | Freq: Every day | ORAL | 0 refills | Status: AC

## 2018-10-09 MED ORDER — SACUBITRIL-VALSARTAN 24-26 MG PO TABS: 1.0000 | ORAL_TABLET | Freq: Two times a day (BID) | ORAL | 0 refills | Status: DC

## 2018-10-09 MED ORDER — QUETIAPINE FUMARATE 50 MG PO TABS: 50.0000 mg | ORAL_TABLET | Freq: Every day | ORAL | 0 refills | Status: DC

## 2018-10-09 MED ORDER — FUROSEMIDE 40 MG PO TABS: 40.0000 mg | ORAL_TABLET | Freq: Every day | ORAL | 0 refills | Status: DC

## 2018-10-09 MED ORDER — NICOTINE POLACRILEX 2 MG MT GUM: 2.0000 mg | CHEWING_GUM | OROMUCOSAL | 0 refills | Status: DC | PRN

## 2018-10-09 MED ORDER — CARVEDILOL 3.125 MG PO TABS: 3.1250 mg | ORAL_TABLET | Freq: Two times a day (BID) | ORAL | 0 refills | Status: DC

## 2018-10-09 MED ORDER — PANTOPRAZOLE SODIUM 40 MG PO TBEC: 40.0000 mg | DELAYED_RELEASE_TABLET | Freq: Every day | ORAL | 0 refills | Status: DC

## 2018-10-09 NOTE — BHH Suicide Risk Assessment (Signed)
[facility] Discharge Suicide Risk Assessment   Principal Problem: <principal problem not specified> Discharge Diagnoses: Active Problems:   Schizoaffective disorder ([place])   Total Time spent with patient: 20 minutes  Musculoskeletal: Strength & Muscle Tone: within normal limits Gait & Station: normal Patient leans: N/A  Psychiatric Specialty Exam: Review of Systems  All other systems reviewed and are negative.   Blood pressure 121/84, pulse 61, temperature 97.9 F (36.6 C), temperature source Oral, resp. rate 16, height 6' (1.829 m), weight 84.8 kg, SpO2 100 %.Body mass index is 25.36 kg/m.  General Appearance: Casual  Eye Contact::  Good  Speech:  Normal Rate409  Volume:  Normal  Mood:  Euthymic  Affect:  Congruent  Thought Process:  Coherent and Descriptions of Associations: Intact  Orientation:  Full (Time, Place, and Person)  Thought Content:  Logical  Suicidal Thoughts:  No  Homicidal Thoughts:  No  Memory:  Immediate;   Fair Recent;   Fair Remote;   Fair  Judgement:  Intact  Insight:  Fair  Psychomotor Activity:  Normal  Concentration:  Fair  Recall:  [employer] of Knowledge:Fair  Language: Fair  Akathisia:  Negative  Handed:  Right  AIMS (if indicated):     Assets:  Desire for Improvement Resilience  Sleep:  Number of Hours: 6.25  Cognition: WNL  ADL's:  Intact   Mental Status Per Nursing Assessment::   On Admission:  NA  Demographic Factors:  Male and Unemployed  Loss Factors: Decline in physical health  Historical Factors: Impulsivity  Risk Reduction Factors:   Living with another person, especially a relative  Continued Clinical Symptoms:  Bipolar Disorder:   Depressive phase Depression:   Comorbid alcohol abuse/dependence Impulsivity Alcohol/Substance Abuse/Dependencies Schizophrenia:   Depressive state  Cognitive Features That Contribute To Risk:  None    Suicide Risk:  Minimal: No identifiable suicidal ideation.  Patients presenting  with no risk factors but with morbid ruminations; may be classified as minimal risk based on the severity of the depressive symptoms  [place] Follow up on 10/11/2018.   Specialty: Behavioral Health Why: Intake for outpatient services for substance abuse is Wednesday, 9/16 at 11:00a.  Please bring your photo ID, insurance card, and current medications.  Contact information: [address] [place] [address] [phone]           Plan Of Care/Follow-up recommendations:  Activity:  ad lib  Sharma Covert, MD 10/09/2018, 10:47 AM

## 2018-10-09 NOTE — Progress Notes (Signed)
Patient ID: Kerry Hamilton, male   DOB: [birth year], 53 y.o.   MRN: [id number]   D: Pt alert and oriented on the unit.   A: Education, support, and encouragement provided. Discharge summary, medications and follow up appointments reviewed with pt. Suicide prevention resources provided, including "My 3 App." Pt's belongings in locker # 47 returned and belongings sheet signed.  R: Pt denies SI/HI, A/VH, pain, or any concerns at this time. Pt ambulatory on and off unit. Pt discharged to lobby.

## 2018-10-09 NOTE — Progress Notes (Signed)
Patient ID: Kerry Hamilton, male   DOB: 19-Feb-1965, 53 y.o.   MRN: [id number]  [place] NOVEL CORONAVIRUS (COVID-19) DAILY CHECK-OFF SYMPTOMS - answer yes or no to each - every day NO YES  Have you had a fever in the past 24 hours?  . Fever (Temp > 37.80C / 100F) X   Have you had any of these symptoms in the past 24 hours? . New Cough .  Sore Throat  .  Shortness of Breath .  Difficulty Breathing .  Unexplained Body Aches   X   Have you had any one of these symptoms in the past 24 hours not related to allergies?   . Runny Nose .  Nasal Congestion .  Sneezing   X   If you have had runny nose, nasal congestion, sneezing in the past 24 hours, has it worsened?  X   EXPOSURES - check yes or no X   Have you traveled outside the state in the past 14 days?  X   Have you been in contact with someone with a confirmed diagnosis of COVID-19 or PUI in the past 14 days without wearing appropriate PPE?  X   Have you been living in the same home as a person with confirmed diagnosis of COVID-19 or a PUI (household contact)?    X   Have you been diagnosed with COVID-19?    X              What to do next: Answered NO to all: Answered YES to anything:   Proceed with unit schedule Follow the [facility] Inpatient Flowsheet.

## 2018-10-09 NOTE — Progress Notes (Signed)
D.  Pt pleasant on approach, complaint of left jaw pain.  See pain flow sheet.  Pt was positive for evening wrap up group, observed engaged in appropriate interaction with peers on the unit this evening, can be intrusive at times.  Pt denies SI/HI/AVH at this time.  A.  Support and encouragement offered, medication given as ordered  R.  Pt remains safe on the unit, will continue to monitor.

## 2018-10-09 NOTE — Progress Notes (Signed)
Recreation Therapy Notes  Date:  9.14.20 Time: 0930 Location: [address] Dayroom  Group Topic: Stress Management  Goal Area(s) Addresses:  Patient will identify positive stress management techniques. Patient will identify benefits of using stress management post d/c.  Behavioral Response:  Engaged  Intervention: Stress Management  Activity :  Meditation.  LRT introduced the stress management technique of meditation.  LRT played a meditation that focused on making the most of your day.  Patients were to follow along as meditation played to engage in activity.  Education:  Stress Management, Discharge Planning.   Education Outcome: Acknowledges Education  Clinical Observations/Feedback:  Pt attended and participated in group.    Victorino Sparrow, LRT/CTRS    Ria Comment, Ante Arredondo A 10/09/2018 9:58 AM
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
## 2018-10-09 NOTE — [facility] Notes (Signed)
  [facility] Adult Case Management Discharge Plan :  Will you be returning to the same living situation after discharge:  Yes,  home At discharge, do you have transportation home?: Yes,  taking the bus. Buses are free. Do you have the ability to pay for your medications: Yes,  Medicaid.  Release of information consent forms completed and in the chart. Patient to Follow up at: [place] Follow up on 10/11/2018.   Specialty: Behavioral Health Why: Intake for outpatient services for substance abuse is Wednesday, 9/16 at 11:00a.  Please bring your photo ID, insurance card, and current medications.  Contact information: [place] [address] [phone]        [facility] Follow up.   Why: Please call to schedule appointment for primary care provider for diabetes, HTN, CHF. Contact information: [address] [place] [address] [phone]          Next level of care provider has access to [place] and Suicide Prevention discussed: Yes,  with patient/    Has patient been referred to the Quitline?: Patient refused referral  Patient has been referred for addiction treatment: Yes  Joellen Jersey, [place] 10/09/2018, 11:31 AM

## 2018-10-27 ENCOUNTER — Telehealth (HOSPITAL_COMMUNITY): Payer: Self-pay

## 2018-10-27 NOTE — Telephone Encounter (Deleted)
2nd attempt to contact PT to schedule New Pt appt with Heart Failure Clinic was unsuccessful.  Left message with individual who answered the phone for the PT to contact the [facility] to schedule and appt.

## 2018-10-27 NOTE — Telephone Encounter (Signed)
2nd attempt to contact PT to schedule New Pt appt with Heart Failure Clinic was unsuccessful.  Left message with individual who answered the phone for the PT to contact the [facility] to schedule an appt.

## 2018-11-15 ENCOUNTER — Ambulatory Visit (HOSPITAL_COMMUNITY)
Admission: RE | Admit: 2018-11-15 | Discharge: 2018-11-15 | Disposition: A | Discharge status: Home / Self Care | Payer: Medicaid Other | Source: Ambulatory Visit | Attending: Internal Medicine | Admitting: Internal Medicine

## 2018-11-15 ENCOUNTER — Encounter (HOSPITAL_COMMUNITY): Payer: Self-pay | Admitting: Internal Medicine

## 2018-11-15 ENCOUNTER — Other Ambulatory Visit: Payer: Self-pay

## 2018-11-15 VITALS — BP 134/78 | HR 61 | Wt 177.2 lb

## 2018-11-15 DIAGNOSIS — F329 Major depressive disorder, single episode, unspecified: Secondary | ICD-10-CM | POA: Insufficient documentation

## 2018-11-15 DIAGNOSIS — F209 Schizophrenia, unspecified: Secondary | ICD-10-CM | POA: Diagnosis not present

## 2018-11-15 DIAGNOSIS — I5022 Chronic systolic (congestive) heart failure: Secondary | ICD-10-CM | POA: Diagnosis present

## 2018-11-15 DIAGNOSIS — I428 Other cardiomyopathies: Secondary | ICD-10-CM | POA: Diagnosis not present

## 2018-11-15 DIAGNOSIS — F17210 Nicotine dependence, cigarettes, uncomplicated: Secondary | ICD-10-CM | POA: Insufficient documentation

## 2018-11-15 DIAGNOSIS — Z720 Tobacco use: Secondary | ICD-10-CM | POA: Diagnosis not present

## 2018-11-15 DIAGNOSIS — Z79899 Other long term (current) drug therapy: Secondary | ICD-10-CM | POA: Diagnosis not present

## 2018-11-15 DIAGNOSIS — Z8249 Family history of ischemic heart disease and other diseases of the circulatory system: Secondary | ICD-10-CM | POA: Insufficient documentation

## 2018-11-15 DIAGNOSIS — E119 Type 2 diabetes mellitus without complications: Secondary | ICD-10-CM | POA: Diagnosis not present

## 2018-11-15 DIAGNOSIS — Z833 Family history of diabetes mellitus: Secondary | ICD-10-CM | POA: Diagnosis not present

## 2018-11-15 LAB — BASIC METABOLIC PANEL
Anion gap: 12 (ref 5–15)
BUN: 13 mg/dL (ref 6–20)
CO2: 21 mmol/L — ABNORMAL LOW (ref 22–32)
Calcium: 9 mg/dL (ref 8.9–10.3)
Chloride: 104 mmol/L (ref 98–111)
Creatinine, Ser: 1.23 mg/dL (ref 0.61–1.24)
GFR calc Af Amer: >60 mL/min (ref >60)
GFR calc non Af Amer: >60 mL/min (ref >60)
Glucose, Bld: 137 mg/dL — ABNORMAL HIGH (ref 70–99)
Potassium: 4.8 mmol/L (ref 3.5–5.1)
Sodium: 137 mmol/L (ref 135–145)

## 2018-11-15 LAB — BRAIN NATRIURETIC PEPTIDE: B Natriuretic Peptide: 292.9 pg/mL — ABNORMAL HIGH (ref 0.0–100.0)

## 2018-11-15 MED ORDER — SACUBITRIL-VALSARTAN 49-51 MG PO TABS: 1.0000 | ORAL_TABLET | Freq: Two times a day (BID) | ORAL | 6 refills | Status: DC

## 2018-11-15 MED ORDER — SPIRONOLACTONE 25 MG PO TABS: 12.5000 mg | ORAL_TABLET | Freq: Every day | ORAL | 3 refills | Status: DC

## 2018-11-15 NOTE — [facility] Notes (Signed)
ADVANCED HF CLINIC CONSULT NOTE  Referring Physician: Hilty Primary Care: [place] Primary Cardiologist: Hilty  HPI:  Kerry Hamilton is a 53 y.o. male with a past medial history significant for schizophrenia, tobacco/ETOH abuse, diabetes and systolic HF due to NICM with EF 15% referred by Dr. Rennis Golden for further evaluation of HF.   He presented in May with progressive shortness of breath with minimal activity was found to be in congestive heart failure with elevated BNP and troponin.  Echo LVEF 15% with severely dilated LV and pseudonormalization of diastolic function.  Ultimately underwent right and left heart catheterization after diuresis, demonstrated no significant coronary disease with mildly elevated LV filling pressures and a cardiac index of 2.2.   Says he is currently living wit his sister. Overall feeling fine.  Says he is smoking 1/2 ppd. Drinking beer on weekends usually about 2 x 40oz. Says he is taking medicine every day. Denies SOB, CP orthopnea or PND.    Review of Systems: [y] = yes, [ ]  = no   General: Weight gain [ ] ; Weight loss [ ] ; Anorexia [ ] ; Fatigue [ ] ; Fever [ ] ; Chills [ ] ; Weakness [ ]   Cardiac: Chest pain/pressure [ ] ; Resting SOB [ ] ; Exertional SOB [ ] ; Orthopnea [ ] ; Pedal Edema [ ] ; Palpitations [ ] ; Syncope [ ] ; Presyncope [ ] ; Paroxysmal nocturnal dyspnea[ ]   Pulmonary: Cough [ ] ; Wheezing[ ] ; Hemoptysis[ ] ; Sputum [ ] ; Snoring [ ]   GI: Vomiting[ ] ; Dysphagia[ ] ; Melena[ ] ; Hematochezia [ ] ; Heartburn[ ] ; Abdominal pain [ ] ; Constipation [ ] ; Diarrhea [ ] ; BRBPR [ ]   GU: Hematuria[ ] ; Dysuria [ ] ; Nocturia[ ]   Vascular: Pain in legs with walking [ ] ; Pain in feet with lying flat [ ] ; Non-healing sores [ ] ; Stroke [ ] ; TIA [ ] ; Slurred speech [ ] ;  Neuro: Headaches[ ] ; Vertigo[ ] ; Seizures[ ] ; Paresthesias[ ] ;Blurred vision [ ] ; Diplopia [ ] ; Vision changes [ ]   Ortho/Skin: Arthritis [ ] ; Joint pain [ ] ; Muscle pain [ ] ; Joint swelling [ ] ; Back Pain [ ] ;  Rash [ ]   Psych: Depression[y ]; Anxiety[y ]  Heme: Bleeding problems [ ] ; Clotting disorders [ ] ; Anemia [ ]   Endocrine: Diabetes ]; Thyroid dysfunction[ ]    Past Medical History:  Diagnosis Date  . Depression   . Diabetes mellitus without complication (HCC)     Current Outpatient Medications  Medication Sig Dispense Refill  . carvedilol (COREG) 3.125 MG tablet Take 1 tablet (3.125 mg total) by mouth 2 (two) times daily with a meal. 60 tablet 0  . FLUoxetine (PROZAC) 20 MG capsule Take 1 capsule (20 mg total) by mouth daily. 30 capsule 0  . furosemide (LASIX) 40 MG tablet Take 1 tablet (40 mg total) by mouth daily. 30 tablet 0  . pantoprazole (PROTONIX) 40 MG tablet Take 1 tablet (40 mg total) by mouth daily. 30 tablet 0  . QUEtiapine (SEROQUEL) 50 MG tablet Take 1 tablet (50 mg total) by mouth at bedtime. 30 tablet 0  . sacubitril-valsartan (ENTRESTO) 24-26 MG Take 1 tablet by mouth 2 (two) times daily. 60 tablet 0   No current facility-administered medications for this encounter.     No Known Allergies    Social History   Socioeconomic History  . Marital status: Single    Spouse name: Not on file  . Number of children: Not on file  . Years of education: Not on file  . Highest education level:  Not on file  Occupational History  . Not on file  Social Needs  . Financial resource strain: Not on file  . Food insecurity    Worry: Not on file    Inability: Not on file  . Transportation needs    Medical: Not on file    Non-medical: Not on file  Tobacco Use  . Smoking status: Current Every Day Smoker    Packs/day: 1.00    Types: Cigarettes, Cigars  . Smokeless tobacco: Never Used  Substance and Sexual Activity  . Alcohol use: Yes    Alcohol/week: 6.0 standard drinks    Types: 6 Cans of beer per week    Comment: daily  . Drug use: Yes    Types: "Crack" cocaine  . Sexual activity: Not Currently  Lifestyle  . Physical activity    Days per week: Not on file     Minutes per session: Not on file  . Stress: Not on file  Relationships  . Social Herbalist on phone: Not on file    Gets together: Not on file    Attends religious service: Not on file    Active member of club or organization: Not on file    Attends meetings of clubs or organizations: Not on file    Relationship status: Not on file  . Intimate partner violence    Fear of current or ex partner: Not on file    Emotionally abused: Not on file    Physically abused: Not on file    Forced sexual activity: Not on file  Other Topics Concern  . Not on file  Social History Narrative  . Not on file      Family History  Problem Relation Age of Onset  . CAD Paternal Grandmother   . Diabetes Mellitus II Neg Hx     Vitals:   11/15/18 1331  BP: 134/78  Pulse: 61  SpO2: 98%  Weight: 80.4 kg (177 lb 3.2 oz)    PHYSICAL EXAM: General:  Well appearing. No respiratory difficulty HEENT: normal Neck: supple. no JVD. Carotids 2+ bilat; no bruits. No lymphadenopathy or thryomegaly appreciated. Cor: PMI nondisplaced. Regular rate & rhythm. No rubs, gallops or murmurs. Lungs: clear Abdomen: soft, nontender, nondistended. No hepatosplenomegaly. No bruits or masses. Good bowel sounds. Extremities: no cyanosis, clubbing, rash, edema Neuro: alert & oriented x 3, cranial nerves grossly intact. moves all 4 extremities w/o difficulty. Affect pleasant.  ECG: NSR 62 RBBB with early repol Personally reviewed    ASSESSMENT & PLAN:  1. Chronic systolic HF due to NICM - echo 5/20 EF 15% - cath no CAD 5/20  - Much improved. NYHA I - Volume status ok on lasix 40 daily  - continue carvedilol 3.125 bid - Increase entresto to 49/51 bid - Add spiro 12.5 daily - Check BMET and BNP  - Will repeat echo in 3 months -> suspect EF is recovering  2. DM2 - HgbA1c 8.1 in 9/20 - Consider SGLT2i   3. ETOH/tobacco abuse - try to limit as much as possible (particualrly ETOH) - nicotine may have  soothing effect in some schizophrenics so may not be able to quit completely.   Glori Bickers, MD  2:09 PM

## 2018-11-15 NOTE — Addendum Note (Signed)
Encounter addended by: Valeda Malm, RN on: 11/15/2018 2:23 PM  Actions taken: Order list changed, Diagnosis association updated, Charge Capture section accepted, Clinical Note Signed

## 2018-11-15 NOTE — Patient Instructions (Signed)
INCREASE Entreto to 49/51mg  (1 tab) twice a day  START Spironolactone 12.5mg  (1/2 tab) daily  Labs today We will only contact you if something comes back abnormal or we need to make some changes. Otherwise no news is good news!  Your physician has requested that you have an echocardiogram. Echocardiography is a painless test that uses sound waves to create images of your heart. It provides your doctor with information about the size and shape of your heart and how well your heart's chambers and valves are working. This procedure takes approximately one hour. There are no restrictions for this procedure.   Your physician recommends that you schedule a follow-up appointment in: 3 months with Dr Haroldine Laws and an ECHO  At the [facility], you and your health needs are our priority. As part of our continuing mission to provide you with exceptional heart care, we have created designated Provider Care Teams. These Care Teams include your primary Cardiologist (physician) and Advanced Practice Providers (APPs- Physician Assistants and Nurse Practitioners) who all work together to provide you with the care you need, when you need it.   You may see any of the following providers on your designated Care Team at your next follow up: Marland Kitchen Dr Glori Bickers . Dr Loralie Champagne . Darrick Grinder, NP . Lyda Jester, PA   Please be sure to bring in all your medications bottles to every appointment.

## 2018-12-05 ENCOUNTER — Other Ambulatory Visit (HOSPITAL_COMMUNITY): Payer: Medicaid Other

## 2018-12-05 ENCOUNTER — Encounter (HOSPITAL_COMMUNITY): Payer: Self-pay | Admitting: Internal Medicine

## 2018-12-08 ENCOUNTER — Other Ambulatory Visit: Payer: Self-pay

## 2018-12-08 ENCOUNTER — Ambulatory Visit (INDEPENDENT_AMBULATORY_CARE_PROVIDER_SITE_OTHER): Payer: Medicaid Other | Admitting: Internal Medicine

## 2018-12-08 ENCOUNTER — Encounter: Payer: Self-pay | Admitting: Internal Medicine

## 2018-12-08 VITALS — BP 152/93 | HR 73 | Temp 97.7°F | Ht 72.0 in | Wt 185.0 lb

## 2018-12-08 DIAGNOSIS — I420 Dilated cardiomyopathy: Secondary | ICD-10-CM | POA: Diagnosis not present

## 2018-12-08 DIAGNOSIS — Z720 Tobacco use: Secondary | ICD-10-CM

## 2018-12-08 DIAGNOSIS — Z9114 Patient's other noncompliance with medication regimen: Secondary | ICD-10-CM | POA: Diagnosis not present

## 2018-12-08 DIAGNOSIS — I5043 Acute on chronic combined systolic (congestive) and diastolic (congestive) heart failure: Secondary | ICD-10-CM | POA: Diagnosis not present

## 2018-12-08 MED ORDER — CARVEDILOL 3.125 MG PO TABS: 3.1250 mg | ORAL_TABLET | Freq: Two times a day (BID) | ORAL | 11 refills | Status: DC

## 2018-12-08 MED ORDER — FUROSEMIDE 40 MG PO TABS: 40.0000 mg | ORAL_TABLET | Freq: Every day | ORAL | 11 refills | Status: DC

## 2018-12-08 NOTE — Patient Instructions (Signed)
Medication Instructions:  Your physician recommends that you continue on your current medications as directed. Please refer to the Current Medication list given to you today.  *If you need a refill on your cardiac medications before your next appointment, please call your pharmacy*  Lab Work: NONE If you have labs (blood work) drawn today and your tests are completely normal, you will receive your results only by: Marland Kitchen MyChart Message (if you have MyChart) OR . A paper copy in the mail If you have any lab test that is abnormal or we need to change your treatment, we will call you to review the results.  Testing/Procedures: NONE  Follow-Up: At [facility], you and your health needs are our priority.  As part of our continuing mission to provide you with exceptional heart care, we have created designated Provider Care Teams.  These Care Teams include your primary Cardiologist (physician) and Advanced Practice Providers (APPs -  Physician Assistants and Nurse Practitioners) who all work together to provide you with the care you need, when you need it.  Your next appointment:   Follow Up as needed with Dr. Debara Pickett. Continue to follow up in the [facility]

## 2018-12-08 NOTE — Progress Notes (Signed)
OFFICE NOTE  Chief Complaint:  Follow-up heart failure  Primary Care Physician: Medicine, [facility]  HPI:  Kerry Hamilton is a 53 y.o. male with a past medial history significant for schizophrenia, tobacco abuse and diet-controlled diabetes.  He presented in May with progressive shortness of breath with minimal activity was found to be in congestive heart failure with elevated BNP and troponin.  Showed LVEF 15% with severely dilated LV and pseudonormalization of diastolic function.  Ultimately underwent right and left heart catheterization after diuresis, demonstrated no significant coronary disease with mildly elevated LV filling pressures and a cardiac index of 2.2.  He was placed on maximally tolerated medical therapy based on his blood pressure, including aspirin 81 mg daily, carvedilol 3.125 mg twice daily, Lasix 40 mg daily, Protonix 40 mg daily and Entresto 24/26 mg twice daily.  So far he continues to do fairly well.  He says he is tolerating the medications.  His weight has been stable and reduced.  He denies any worsening shortness of breath or signs or symptoms of congestive heart failure.  He gets his medications through the transition of care pharmacy.  12/08/2018  Kerry Hamilton returns today for follow-up.  He was in the [facility].  Subsequently he was referred to Dr. Ronna Polio although I had previously seen him.  This appointment was approximately 3 weeks ago.  At that time Dr. Haroldine Laws added Aldactone and increased his Entresto.  Since then however Kerry Hamilton says he had kind of run out of his medicines.  He said he had not gotten the spironolactone filled and was not sure that he increase the dose of his medicines.  He does have Medicaid and generally pays $3 per medicine but cost has been an issue.  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   Plan at least a week.  was for a repeat echo in January and follow-up with Dr. Haroldine Laws.  He denies any worsening shortness of breath however he has had some weight gain, now up to 185 pounds with some mild lower extremity edema.  He is also been out of his Lasix for at least a week.  PMHx:  Past Medical History:  Diagnosis Date  . Depression   . Diabetes mellitus without complication [facility])     Past Surgical History:  Procedure Laterality Date  . RIGHT/LEFT HEART CATH AND CORONARY ANGIOGRAPHY N/A 06/13/2018   Procedure: RIGHT/LEFT HEART CATH AND CORONARY ANGIOGRAPHY;  Surgeon: Martinique, Peter M, MD;  Location: [facility];  Service: Cardiovascular;  Laterality: N/A;    FAMHx:  Family History  Problem Relation Age of Onset  . CAD Paternal Grandmother   . Diabetes Mellitus II Neg Hx     SOCHx:   reports that he has been smoking cigarettes and cigars. He has been smoking about 1.00 pack per day. He has never used smokeless tobacco. He reports current alcohol use of about 6.0 standard drinks of alcohol per week. He reports current drug use. Drug: "Crack" cocaine.  ALLERGIES:  No Known Allergies  ROS: Pertinent items noted in HPI and remainder of comprehensive ROS otherwise negative.  HOME MEDS: Current Outpatient Medications on File Prior to Visit  Medication Sig Dispense Refill  . carvedilol (COREG) 3.125 MG tablet Take 1 tablet (3.125 mg total) by mouth 2 (two) times daily with a meal. 60 tablet 0  .  FLUoxetine (PROZAC) 20 MG capsule Take 1 capsule (20 mg total) by mouth daily. 30 capsule 0  . furosemide (LASIX) 40 MG tablet Take 1 tablet (40 mg total) by mouth daily. 30 tablet 0  . pantoprazole (PROTONIX) 40 MG tablet Take 1 tablet (40 mg total) by mouth daily. 30 tablet 0  . QUEtiapine (SEROQUEL) 50 MG tablet Take 1 tablet (50 mg total) by mouth at bedtime. 30 tablet 0  . sacubitril-valsartan (ENTRESTO) 49-51 MG Take 1 tablet by mouth 2 (two) times daily. 60 tablet 6  .  spironolactone (ALDACTONE) 25 MG tablet Take 0.5 tablets (12.5 mg total) by mouth daily. (Patient not taking: Reported on 12/08/2018) 45 tablet 3   No current facility-administered medications on file prior to visit.     LABS/IMAGING: No results found for this or any previous visit (from the past 48 hour(s)). No results found.  LIPID PANEL:    Component Value Date/Time   CHOL 160 10/06/2018 0630   TRIG 67 10/06/2018 0630   HDL 52 10/06/2018 0630   CHOLHDL 3.1 10/06/2018 0630   VLDL 13 10/06/2018 0630   LDLCALC 95 10/06/2018 0630     WEIGHTS: Wt Readings from Last 3 Encounters:  12/08/18 185 lb (83.9 kg)  11/15/18 177 lb 3.2 oz (80.4 kg)  08/07/18 178 lb 6.4 oz (80.9 kg)    VITALS: BP (!) 152/93   Pulse 73   Temp 97.7 F (36.5 C)   Ht 6' (1.829 m)   Wt 185 lb (83.9 kg)   SpO2 100%   BMI 25.09 kg/m   EXAM: General appearance: alert, no distress and thin Neck: JVD - 3 cm above sternal notch, no carotid bruit and thyroid not enlarged, symmetric, no tenderness/mass/nodules Lungs: clear to auscultation bilaterally Heart: regular rate and rhythm, S1, S2 normal, no murmur, click, rub or gallop Abdomen: soft, non-tender; bowel sounds normal; no masses,  no organomegaly Extremities: edema trace to 1+ LE edema Pulses: 2+ and symmetric Skin: Skin color, texture, turgor normal. No rashes or lesions Neurologic: Grossly normal Psych: Pleasant  EKG: Deferred  ASSESSMENT: 1. Dilated cardiomyopathy with LVEF 15%, NYHA Class I-II symptoms 2. No significant obstructive coronary disease by cath (05/2018) 3. Schizophrenia 4. Tobacco abuse/etoh use 5. Medication non-compliance d/t cost issues  PLAN: 1.   Kerry Hamilton has recently had some weight gain with some mild lower extremity edema.  He has been off of his diuretic now for close to a week.  He said he never got his spironolactone filled and had possibly not increase the dose of his Entresto.  We have resent all of his  prescriptions to the pharmacy.  He said he should be able to have enough money after working tonight to buy the medications for tomorrow.  I highly encourage compliance with the medications and plan follow-up with Dr. Gala Romney in the CHF clinic in January.  We also discussed smoking cessation and alcohol abstinence.  Chrystie Nose, MD, [facility], FACP  [place]  [facility]  Medical Director of the [facility]id [facility] &  Cardiovascular Risk Reduction Clinic Diplomate of the American Board of Clinical Lipidology Attending Cardiologist  Direct Dial: [phone]  Fax: [phone]  Website:  [email or web address] Nicely Ellyson Rarick 12/08/2018, 1:24 PM

## 2019-01-01 ENCOUNTER — Telehealth (HOSPITAL_COMMUNITY): Payer: Self-pay

## 2019-01-01 ENCOUNTER — Other Ambulatory Visit (HOSPITAL_COMMUNITY): Payer: Medicaid Other

## 2019-01-01 NOTE — Telephone Encounter (Signed)
New message    Just an FYI. We have made several attempts to contact this patient including sending a letter to schedule or reschedule their echocardiogram. We will be removing the patient from the echo WQ.  12.7.20 no show Kerry Hamilton  11.23.20 @ 11:33am call home phone - just rings no vm comes up - Kerry Hamilton   11.10.20 mail reminder letter Kerry Hamilton  11.10.20 no show

## 2019-02-15 ENCOUNTER — Inpatient Hospital Stay (HOSPITAL_COMMUNITY): Admission: RE | Admit: 2019-02-15 | Payer: Medicaid Other | Source: Ambulatory Visit | Admitting: Internal Medicine

## 2019-04-17 ENCOUNTER — Emergency Department (HOSPITAL_COMMUNITY): Payer: Medicaid Other

## 2019-04-17 ENCOUNTER — Other Ambulatory Visit: Payer: Self-pay

## 2019-04-17 ENCOUNTER — Encounter (HOSPITAL_COMMUNITY): Payer: Self-pay | Admitting: Emergency Medicine

## 2019-04-17 ENCOUNTER — Emergency Department (HOSPITAL_COMMUNITY)
Admission: EM | Admit: 2019-04-17 | Discharge: 2019-04-17 | Disposition: A | Discharge status: Home / Self Care | Payer: Medicaid Other | Attending: Emergency Medicine | Admitting: Emergency Medicine

## 2019-04-17 DIAGNOSIS — I5023 Acute on chronic systolic (congestive) heart failure: Secondary | ICD-10-CM | POA: Diagnosis not present

## 2019-04-17 DIAGNOSIS — R0789 Other chest pain: Secondary | ICD-10-CM | POA: Diagnosis not present

## 2019-04-17 DIAGNOSIS — F259 Schizoaffective disorder, unspecified: Secondary | ICD-10-CM | POA: Diagnosis not present

## 2019-04-17 DIAGNOSIS — Z79899 Other long term (current) drug therapy: Secondary | ICD-10-CM | POA: Insufficient documentation

## 2019-04-17 DIAGNOSIS — I509 Heart failure, unspecified: Secondary | ICD-10-CM

## 2019-04-17 DIAGNOSIS — F1490 Cocaine use, unspecified, uncomplicated: Secondary | ICD-10-CM | POA: Insufficient documentation

## 2019-04-17 DIAGNOSIS — E119 Type 2 diabetes mellitus without complications: Secondary | ICD-10-CM | POA: Insufficient documentation

## 2019-04-17 DIAGNOSIS — F17210 Nicotine dependence, cigarettes, uncomplicated: Secondary | ICD-10-CM | POA: Insufficient documentation

## 2019-04-17 DIAGNOSIS — R0602 Shortness of breath: Secondary | ICD-10-CM | POA: Diagnosis present

## 2019-04-17 LAB — CBC
HCT: 39.4 % (ref 39.0–52.0)
Hemoglobin: 12.2 g/dL — ABNORMAL LOW (ref 13.0–17.0)
MCH: 27.4 pg (ref 26.0–34.0)
MCHC: 31 g/dL (ref 30.0–36.0)
MCV: 88.5 fL (ref 80.0–100.0)
Platelets: 222 10*3/uL (ref 150–400)
RBC: 4.45 MIL/uL (ref 4.22–5.81)
RDW: 13.7 % (ref 11.5–15.5)
WBC: 5.4 10*3/uL (ref 4.0–10.5)
nRBC: 0 % (ref 0.0–0.2)

## 2019-04-17 LAB — BASIC METABOLIC PANEL
Anion gap: 11 (ref 5–15)
BUN: 18 mg/dL (ref 6–20)
CO2: 22 mmol/L (ref 22–32)
Calcium: 8.9 mg/dL (ref 8.9–10.3)
Chloride: 108 mmol/L (ref 98–111)
Creatinine, Ser: 1.33 mg/dL — ABNORMAL HIGH (ref 0.61–1.24)
GFR calc Af Amer: >60 mL/min (ref >60)
GFR calc non Af Amer: >60 mL/min (ref >60)
Glucose, Bld: 119 mg/dL — ABNORMAL HIGH (ref 70–99)
Potassium: 4.2 mmol/L (ref 3.5–5.1)
Sodium: 141 mmol/L (ref 135–145)

## 2019-04-17 LAB — BRAIN NATRIURETIC PEPTIDE: B Natriuretic Peptide: 1169.4 pg/mL — ABNORMAL HIGH (ref 0.0–100.0)

## 2019-04-17 LAB — TROPONIN I (HIGH SENSITIVITY)
Troponin I (High Sensitivity): 27 ng/L — ABNORMAL HIGH
Troponin I (High Sensitivity): 28 ng/L — ABNORMAL HIGH

## 2019-04-17 MED ORDER — SODIUM CHLORIDE 0.9% FLUSH: 3.0000 mL | Freq: Once | INTRAVENOUS | Status: DC

## 2019-04-17 MED ORDER — FUROSEMIDE 40 MG PO TABS: 40.0000 mg | ORAL_TABLET | Freq: Every day | ORAL | 0 refills | Status: DC

## 2019-04-17 MED ORDER — FUROSEMIDE 10 MG/ML IJ SOLN
40.0000 mg | Freq: Once | INTRAMUSCULAR | Status: AC
  Administered 2019-04-17: 40 mg via INTRAVENOUS
  Filled 2019-04-17: qty 4

## 2019-04-17 NOTE — ED Provider Notes (Signed)
[facility] EMERGENCY DEPARTMENT Provider Note   CSN: [id number] Arrival date & time: 04/17/19  1408     History Chief Complaint  Patient presents with  . Shortness of Breath  . Chest Pain    Kerry Hamilton is a 54 y.o. male.  HPI Patient presents to the emergency department with complaints that has had ongoing shortness of breath and chest pain for quite a while.  The patient states that he felt today it may have gotten worse.  The patient states that he does do a lot of crack cocaine along with snorting cocaine and drinks heavily.  Patient states that he is concerned that he may be having a CHF exacerbation.  The patient states he is not taking his Lasix.  The patient missed an appointment with his cardiologist in January as well.  Patient states that he has no other real complaints at this time.  Patient states that the pain has been constant but does not seem worse than normal today.  The patient states that he did not take any other medications prior to arrival for his symptoms.  Patient states he does not have a lot of swelling in his legs and no exertional shortness of breath or chest pain.  The patient denies headache,blurred vision, neck pain, fever, cough, weakness, numbness, dizziness, anorexia, edema, abdominal pain, nausea, vomiting, diarrhea, rash, back pain, dysuria, hematemesis, bloody stool, near syncope, or syncope.    Past Medical History:  Diagnosis Date  . Depression   . Diabetes mellitus without complication [facility])     Patient Active Problem List   Diagnosis Date Noted  . Schizoaffective disorder (HCC) 10/05/2018  . Acute CHF (congestive heart failure) (HCC) 08/01/2018  . Elevated liver enzymes 08/01/2018  . Acute on chronic systolic CHF (congestive heart failure) (HCC) 07/13/2018  . ARF (acute renal failure) (HCC) 07/13/2018  . Acute systolic CHF (congestive heart failure) (HCC)   . Elevated troponin   . Shortness of breath   . LFT elevation     . Prolonged QT interval   . Polysubstance abuse (HCC)   . ACS (acute coronary syndrome) (HCC) 06/10/2018  . Diabetes mellitus type 2, noninsulin dependent (HCC) 06/10/2018  . Depression 06/10/2018    Past Surgical History:  Procedure Laterality Date  . RIGHT/LEFT HEART CATH AND CORONARY ANGIOGRAPHY N/A 06/13/2018   Procedure: RIGHT/LEFT HEART CATH AND CORONARY ANGIOGRAPHY;  Surgeon: [place]az[place], Peter M, MD;  Location: [facility] INVASIVE CV LAB;  Service: Cardiovascular;  Laterality: N/A;       Family History  Problem Relation Age of Onset  . CAD Paternal Grandmother   . Diabetes Mellitus II Neg Hx     Social History   Tobacco Use  . Smoking status: Current Every Day Smoker    Packs/day: 1.00    Types: Cigarettes, Cigars  . Smokeless tobacco: Never Used  Substance Use Topics  . Alcohol use: Yes    Alcohol/week: 6.0 standard drinks    Types: 6 Cans of beer per week    Comment: daily  . Drug use: Yes    Types: "Crack" cocaine    Home Medications Prior to Admission medications   Medication Sig Start Date End Date Taking? Authorizing Provider  carvedilol (COREG) 3.125 MG tablet Take 1 tablet (3.125 mg total) by mouth 2 (two) times daily with a meal. 12/08/18 03/08/19  Hilty, Lisette Abu, MD  FLUoxetine (PROZAC) 20 MG capsule Take 1 capsule (20 mg total) by mouth daily. 10/10/18   Gerilyn Pilgrim,  Marcelyn Ditty, NP  furosemide (LASIX) 40 MG tablet Take 1 tablet (40 mg total) by mouth daily. 12/08/18 01/07/19  Chrystie Nose, MD  furosemide (LASIX) 40 MG tablet Take 1 tablet (40 mg total) by mouth daily. 04/17/19   Keante Urizar, Cristal Deer, PA-C  pantoprazole (PROTONIX) 40 MG tablet Take 1 tablet (40 mg total) by mouth daily. 10/09/18   Aldean Baker, NP  QUEtiapine (SEROQUEL) 50 MG tablet Take 1 tablet (50 mg total) by mouth at bedtime. 10/09/18   Aldean Baker, NP  sacubitril-valsartan (ENTRESTO) 49-51 MG Take 1 tablet by mouth 2 (two) times daily. 11/15/18   Bensimhon, Bevelyn Buckles, MD  spironolactone  (ALDACTONE) 25 MG tablet Take 0.5 tablets (12.5 mg total) by mouth daily. Patient not taking: Reported on 12/08/2018 11/15/18 02/13/19  Bensimhon, Bevelyn Buckles, MD    Allergies    Patient has no known allergies.  Review of Systems   Review of Systems  Physical Exam Updated Vital Signs BP (!) 145/91   Pulse (!) 59   Temp 98.3 F (36.8 C) (Oral)   Resp 14   Ht 6' (1.829 m)   Wt 82.1 kg   SpO2 100%   BMI 24.55 kg/m   Physical Exam Vitals and nursing note reviewed.  Constitutional:      General: He is not in acute distress.    Appearance: He is well-developed.  HENT:     Head: Normocephalic and atraumatic.  Eyes:     Pupils: Pupils are equal, round, and reactive to light.  Cardiovascular:     Rate and Rhythm: Normal rate and regular rhythm.     Heart sounds: Normal heart sounds. No murmur. No friction rub. No gallop.   Pulmonary:     Effort: Pulmonary effort is normal. No respiratory distress.     Breath sounds: Normal breath sounds. No wheezing.  Abdominal:     General: Bowel sounds are normal. There is no distension.     Palpations: Abdomen is soft.     Tenderness: There is no abdominal tenderness.  Musculoskeletal:     Cervical back: Normal range of motion and neck supple.  Skin:    General: Skin is warm and dry.     Capillary Refill: Capillary refill takes less than 2 seconds.     Findings: No erythema or rash.  Neurological:     Mental Status: He is alert and oriented to person, place, and time.     Motor: No abnormal muscle tone.     Coordination: Coordination normal.  Psychiatric:        Behavior: Behavior normal.     ED Results / Procedures / Treatments   Labs (all labs ordered are listed, but only abnormal results are displayed) Labs Reviewed  BASIC METABOLIC PANEL - Abnormal; Notable for the following components:      Result Value   Glucose, Bld 119 (*)    Creatinine, Ser 1.33 (*)    All other components within normal limits  CBC - Abnormal; Notable  for the following components:   Hemoglobin 12.2 (*)    All other components within normal limits  BRAIN NATRIURETIC PEPTIDE - Abnormal; Notable for the following components:   B Natriuretic Peptide 1,169.4 (*)    All other components within normal limits  TROPONIN I (HIGH SENSITIVITY) - Abnormal; Notable for the following components:   Troponin I (High Sensitivity) 27 (*)    All other components within normal limits  TROPONIN I (HIGH SENSITIVITY) - Abnormal; Notable  for the following components:   Troponin I (High Sensitivity) 28 (*)    All other components within normal limits    EKG   Radiology DG Chest 2 View  Result Date: 04/17/2019 CLINICAL DATA:  Chest pain EXAM: CHEST - 2 VIEW COMPARISON:  10/04/2018 FINDINGS: Stable cardiomegaly. Mild vascular congestion with mildly increased bilateral perihilar interstitial markings. No focal airspace consolidation, pleural effusion, or pneumothorax. Osseous structures unremarkable. IMPRESSION: Findings of CHF with mild edema. Electronically Signed   By: Davina Poke D.O.   On: 04/17/2019 15:01    Procedures Procedures (including critical care time)  Medications Ordered in ED Medications  sodium chloride flush (NS) 0.9 % injection 3 mL (has no administration in time range)  furosemide (LASIX) injection 40 mg (40 mg Intravenous Given 04/17/19 1951)    ED Course  I have reviewed the triage vital signs and the nursing notes.  Pertinent labs & imaging results that were available during my care of the patient were reviewed by me and considered in my medical decision making (see chart for details).    MDM Rules/Calculators/A&P                     The patient's complaints seem to be revolving around this chronic chest discomfort and shortness of breath.  The patient has no significant edema or distress here in the emergency department.  I feel that if we give him a dose of Lasix and start him back on his Lasix.  The patient agrees to this  plan and all questions were answered.  The patient is advised that he needs to see his cardiologist soon as possible.  At this point I do feel there is a significant need for her to be in the hospital based on his current presentation and stable vital signs.  Final Clinical Impression(s) / ED Diagnoses Final diagnoses:  Acute on chronic congestive heart failure, unspecified heart failure type [facility])    Rx / DC Orders ED Discharge Orders         Ordered    furosemide (LASIX) 40 MG tablet  Daily     04/17/19 2023           Dalia Heading, PA-C 04/17/19 2323    Noemi Chapel, MD 04/18/19 [phone]

## 2019-04-17 NOTE — ED Notes (Signed)
Patient verbalizes understanding of discharge instructions. Opportunity for questioning and answers were provided. Armband removed by staff, pt discharged from ED.  

## 2019-04-17 NOTE — ED Triage Notes (Signed)
Pt arrives to ED from home with complaints of shortness of breath and chest pain for awhile now. Patient states that the SOB and CP has worsened today. Patient describes the pain as pressure that is generalized. Patient states he has CHF and does not take his lasix. Patient states he drinks heavy, smokes crack, and snorts cocaine just about everyday.

## 2019-04-17 NOTE — Discharge Instructions (Addendum)
You will need to follow up with the heart failure clinic. Return here as needed. Make sure you are taking your Lasix

## 2019-07-03 ENCOUNTER — Other Ambulatory Visit: Payer: Self-pay

## 2019-07-03 ENCOUNTER — Ambulatory Visit (HOSPITAL_COMMUNITY)
Admission: RE | Admit: 2019-07-03 | Discharge: 2019-07-03 | Disposition: A | Discharge status: Home / Self Care | Payer: Medicaid Other | Source: Ambulatory Visit | Attending: Cardiology | Admitting: Cardiology

## 2019-07-03 ENCOUNTER — Other Ambulatory Visit (HOSPITAL_COMMUNITY): Payer: Self-pay | Admitting: Cardiology

## 2019-07-03 ENCOUNTER — Encounter (HOSPITAL_COMMUNITY): Payer: Self-pay

## 2019-07-03 VITALS — BP 112/76 | HR 59 | Wt 175.0 lb

## 2019-07-03 DIAGNOSIS — I5022 Chronic systolic (congestive) heart failure: Secondary | ICD-10-CM

## 2019-07-03 DIAGNOSIS — Z8249 Family history of ischemic heart disease and other diseases of the circulatory system: Secondary | ICD-10-CM | POA: Diagnosis not present

## 2019-07-03 DIAGNOSIS — I428 Other cardiomyopathies: Secondary | ICD-10-CM | POA: Insufficient documentation

## 2019-07-03 DIAGNOSIS — E119 Type 2 diabetes mellitus without complications: Secondary | ICD-10-CM | POA: Diagnosis not present

## 2019-07-03 DIAGNOSIS — F17210 Nicotine dependence, cigarettes, uncomplicated: Secondary | ICD-10-CM | POA: Diagnosis not present

## 2019-07-03 DIAGNOSIS — F209 Schizophrenia, unspecified: Secondary | ICD-10-CM | POA: Insufficient documentation

## 2019-07-03 DIAGNOSIS — Z833 Family history of diabetes mellitus: Secondary | ICD-10-CM | POA: Insufficient documentation

## 2019-07-03 DIAGNOSIS — F329 Major depressive disorder, single episode, unspecified: Secondary | ICD-10-CM | POA: Diagnosis not present

## 2019-07-03 DIAGNOSIS — Z79899 Other long term (current) drug therapy: Secondary | ICD-10-CM | POA: Insufficient documentation

## 2019-07-03 DIAGNOSIS — F1010 Alcohol abuse, uncomplicated: Secondary | ICD-10-CM | POA: Diagnosis not present

## 2019-07-03 LAB — BASIC METABOLIC PANEL
Anion gap: 10 (ref 5–15)
BUN: 25 mg/dL — ABNORMAL HIGH (ref 6–20)
CO2: 19 mmol/L — ABNORMAL LOW (ref 22–32)
Calcium: 8.7 mg/dL — ABNORMAL LOW (ref 8.9–10.3)
Chloride: 110 mmol/L (ref 98–111)
Creatinine, Ser: 1.4 mg/dL — ABNORMAL HIGH (ref 0.61–1.24)
GFR calc Af Amer: >60 mL/min (ref >60)
GFR calc non Af Amer: 57 mL/min — ABNORMAL LOW (ref >60)
Glucose, Bld: 138 mg/dL — ABNORMAL HIGH (ref 70–99)
Potassium: 4.7 mmol/L (ref 3.5–5.1)
Sodium: 139 mmol/L (ref 135–145)

## 2019-07-03 MED ORDER — PANTOPRAZOLE SODIUM 40 MG PO TBEC: 40.0000 mg | DELAYED_RELEASE_TABLET | Freq: Every day | ORAL | 0 refills | Status: DC

## 2019-07-03 MED ORDER — LOSARTAN POTASSIUM 25 MG PO TABS: 12.5000 mg | ORAL_TABLET | Freq: Every day | ORAL | 2 refills | Status: DC

## 2019-07-03 MED ORDER — SPIRONOLACTONE 25 MG PO TABS: 12.5000 mg | ORAL_TABLET | Freq: Every day | ORAL | 3 refills | Status: DC

## 2019-07-03 MED ORDER — FUROSEMIDE 40 MG PO TABS: 40.0000 mg | ORAL_TABLET | Freq: Every day | ORAL | 2 refills | Status: DC

## 2019-07-03 MED ORDER — SACUBITRIL-VALSARTAN 49-51 MG PO TABS: 1.0000 | ORAL_TABLET | Freq: Two times a day (BID) | ORAL | 2 refills | Status: DC

## 2019-07-03 MED ORDER — CARVEDILOL 3.125 MG PO TABS: 3.1250 mg | ORAL_TABLET | Freq: Two times a day (BID) | ORAL | 2 refills | Status: DC

## 2019-07-03 NOTE — Progress Notes (Signed)
ADVANCED HF CLINIC PROGRESS NOTE  Referring Physician: Hilty Primary Care: [place] Primary Cardiologist: Hilty AHF: Dr. Gala Romney    HPI: Kerry Hamilton is a 54 y.o. male with a past medial history significant for schizophrenia, tobacco/ETOH abuse, diabetes and systolic HF due to NICM with EF 15%, initially referred by Dr. Rennis Golden for further evaluation of HF.   He presented in May 2020 with progressive shortness of breath with minimal activity was found to be in congestive heart failure with elevated BNP and troponin.  Echo LVEF 15% with severely dilated LV and pseudonormalization of diastolic function.  Ultimately underwent right and left heart catheterization after diuresis, demonstrated no significant coronary disease with mildly elevated LV filling pressures and a cardiac index of 2.2.   Had initial [facility] consultation w/ Dr. Gala Romney 10/20. Fluid status was ok. Meds were titrated. Entresto increased to 49/51 bid and spironolactone added. Unfortunately, he failed to f/u after his initial visit.   He was seen in the ED in March with SOB and CP and found to be mildly volume overloaded. This was in the setting of poor compliance w/ home lasix and cocaine and ETOH use. HS trops were flat, 53>>28. BNP 1,169.  He was treated w/ dose of IV Lasix in the ED w/ improvement in symptoms and did not require admission. He was given new Rx for PO Lasix and instructed to f/u as outpatient.   Today in clinic he reports feeling well. Denies dyspnea. NYHA Class II. Wt stable. No Edmar. Looks euvolemic on exam. HR and BP well controlled. No return ER visits. Has been fully compliant w/ Coreg and Lasix. He claims that Entresto and sprio were never sent to pharmacy to pick up. Has not been taking. His BP is in the 110 systolic. Continues to drink beer (about 6 cans a day). Still smoking cigarettes. Last use cocaine 2 weeks ago. Reports he is quitting.    Past Medical History:  Diagnosis Date  . Depression   .  Diabetes mellitus without complication (HCC)     Current Outpatient Medications  Medication Sig Dispense Refill  . carvedilol (COREG) 3.125 MG tablet Take 1 tablet (3.125 mg total) by mouth 2 (two) times daily with a meal. 60 tablet 11  . FLUoxetine (PROZAC) 20 MG capsule Take 1 capsule (20 mg total) by mouth daily. 30 capsule 0  . furosemide (LASIX) 40 MG tablet Take 1 tablet (40 mg total) by mouth daily. 30 tablet 0  . QUEtiapine (SEROQUEL) 50 MG tablet Take 1 tablet (50 mg total) by mouth at bedtime. 30 tablet 0   No current facility-administered medications for this encounter.    No Known Allergies    Social History   Socioeconomic History  . Marital status: Single    Spouse name: Not on file  . Number of children: Not on file  . Years of education: Not on file  . Highest education level: Not on file  Occupational History  . Not on file  Tobacco Use  . Smoking status: Current Every Day Smoker    Packs/day: 1.00    Types: Cigarettes, Cigars  . Smokeless tobacco: Never Used  Substance and Sexual Activity  . Alcohol use: Yes    Alcohol/week: 6.0 standard drinks    Types: 6 Cans of beer per week    Comment: daily  . Drug use: Yes    Types: "Crack" cocaine  . Sexual activity: Not Currently  Other Topics Concern  . Not on file  Social  History Narrative  . Not on file   Social Determinants of Health   Financial Resource Strain:   . Difficulty of Paying Living Expenses:   Food Insecurity:   . Worried About Charity fundraiser in the Last Year:   . Arboriculturist in the Last Year:   Transportation Needs:   . Film/video editor (Medical):   Marland Kitchen Lack of Transportation (Non-Medical):   Physical Activity:   . Days of Exercise per Week:   . Minutes of Exercise per Session:   Stress:   . Feeling of Stress :   Social Connections:   . Frequency of Communication with Friends and Family:   . Frequency of Social Gatherings with Friends and Family:   . Attends  Religious Services:   . Active Member of Clubs or Organizations:   . Attends Archivist Meetings:   Marland Kitchen Marital Status:   Intimate Partner Violence:   . Fear of Current or Ex-Partner:   . Emotionally Abused:   Marland Kitchen Physically Abused:   . Sexually Abused:       Family History  Problem Relation Age of Onset  . CAD Paternal Grandmother   . Diabetes Mellitus II Neg Hx     Vitals:   07/03/19 1111  BP: 112/76  Pulse: (!) 59  SpO2: 99%  Weight: 79.4 kg (175 lb)    PHYSICAL EXAM: General:  Well appearing. No respiratory difficulty HEENT: normal Neck: supple. no JVD. Carotids 2+ bilat; no bruits. No lymphadenopathy or thryomegaly appreciated. Cor: PMI nondisplaced. Regular rate & rhythm. No rubs, gallops or murmurs. Lungs: clear Abdomen: soft, nontender, nondistended. No hepatosplenomegaly. No bruits or masses. Good bowel sounds. Extremities: no cyanosis, clubbing, rash, edema Neuro: alert & oriented x 3, cranial nerves grossly intact. moves all 4 extremities w/o difficulty. Affect pleasant.   ASSESSMENT & PLAN:  1. Chronic systolic HF due to NICM - echo 5/20 EF 15% - cath no CAD 5/20  - Euvolemic on Exam. NYHA II - Continue  Lasix 40 mg daily  - Continue carvedilol 3.125 bid - BP too soft for Entresto. Start Losartan 12.5 mg qhs. Repeat BMP in 1 week.  - Try to add spiro next visit, if BP allows  - Check BMP today  - discussed importance of routine clinic f/u to optimize meds. Also discussed importance of abstinence from ETOH/ cocaine and tobacco.  - plan to repeat echo after meds are optimized.   2. DM2 - HgbA1c 8.1 in 9/20 - Consider SGLT2i soon   3. ETOH/tobacco abuse - try to limit as much as possible (particualrly ETOH), drinks about 6 cans of beer a day - nicotine may have soothing effect in some schizophrenics so may not be able to quit completely.   F/u w/ PharmD in 3-4 weeks. Dr. Haroldine Laws in 8 weeks.   Lyda Jester, PA-C  11:17 AM

## 2019-07-03 NOTE — Patient Instructions (Signed)
START Losartan 12.5 (one half tab) daily AT BEDTIME  Labs today We will only contact you if something comes back abnormal or we need to make some changes. Otherwise no news is good news!  Your physician recommends that you schedule a follow-up appointment in: 4 WEEKS WITH PHARMACY TEAM Your physician recommends that you schedule a follow-up appointment in: 8 WEEKS WITH DR Gala Romney  At the [facility], you and your health needs are our priority. As part of our continuing mission to provide you with exceptional heart care, we have created designated Provider Care Teams. These Care Teams include your primary Cardiologist (physician) and Advanced Practice Providers (APPs- Physician Assistants and Nurse Practitioners) who all work together to provide you with the care you need, when you need it.   You may see any of the following providers on your designated Care Team at your next follow up: Marland Kitchen Dr Arvilla Meres . Dr Marca Ancona . Tonye Becket, NP . Robbie Lis, PA . Karle Plumber, PharmD   Please be sure to bring in all your medications bottles to every appointment.

## 2019-07-11 ENCOUNTER — Telehealth (HOSPITAL_COMMUNITY): Payer: Self-pay | Admitting: Vascular Surgery

## 2019-07-11 NOTE — Telephone Encounter (Signed)
Called pt to reschedule appt 85/21 to 8/16 @ 2pm, no answer/no VM will send pt new appt in mail

## 2019-07-12 ENCOUNTER — Telehealth (HOSPITAL_COMMUNITY): Payer: Self-pay | Admitting: Cardiology

## 2019-07-12 DIAGNOSIS — I5022 Chronic systolic (congestive) heart failure: Secondary | ICD-10-CM

## 2019-07-12 DIAGNOSIS — I5043 Acute on chronic combined systolic (congestive) and diastolic (congestive) heart failure: Secondary | ICD-10-CM

## 2019-07-12 NOTE — Telephone Encounter (Signed)
[phone] (H) pt aware via sister repeat labs x 1week

## 2019-07-12 NOTE — Telephone Encounter (Signed)
-----   Message from Allayne Butcher, [place] sent at 07/03/2019  2:42 PM EDT ----- Labs stable. Repeat BMP in 1 week.

## 2019-07-19 ENCOUNTER — Other Ambulatory Visit (HOSPITAL_COMMUNITY): Payer: Medicaid Other

## 2019-08-06 ENCOUNTER — Inpatient Hospital Stay (HOSPITAL_COMMUNITY): Admission: RE | Admit: 2019-08-06 | Payer: Medicaid Other | Source: Ambulatory Visit

## 2019-08-30 ENCOUNTER — Encounter (HOSPITAL_COMMUNITY): Payer: Medicaid Other

## 2019-09-10 ENCOUNTER — Telehealth (HOSPITAL_COMMUNITY): Payer: Self-pay | Admitting: Internal Medicine

## 2019-09-10 ENCOUNTER — Encounter (HOSPITAL_COMMUNITY): Payer: Medicaid Other

## 2019-09-10 NOTE — Telephone Encounter (Signed)
No show letter #1 sent.

## 2019-09-26 DIAGNOSIS — I639 Cerebral infarction, unspecified: Secondary | ICD-10-CM

## 2019-09-26 HISTORY — DX: Cerebral infarction, unspecified: I63.9

## 2019-10-11 ENCOUNTER — Emergency Department (HOSPITAL_COMMUNITY): Payer: Medicaid Other

## 2019-10-11 ENCOUNTER — Other Ambulatory Visit: Payer: Self-pay

## 2019-10-11 ENCOUNTER — Inpatient Hospital Stay (HOSPITAL_COMMUNITY)
Admission: EM | Admit: 2019-10-11 | Discharge: 2019-11-02 | DRG: 291 | Disposition: A | Discharge status: Skilled Nursing Facility | Payer: Medicaid Other | Attending: Internal Medicine | Admitting: Internal Medicine

## 2019-10-11 DIAGNOSIS — Z91138 Patient's unintentional underdosing of medication regimen for other reason: Secondary | ICD-10-CM

## 2019-10-11 DIAGNOSIS — R1013 Epigastric pain: Secondary | ICD-10-CM | POA: Diagnosis present

## 2019-10-11 DIAGNOSIS — I6340 Cerebral infarction due to embolism of unspecified cerebral artery: Secondary | ICD-10-CM | POA: Insufficient documentation

## 2019-10-11 DIAGNOSIS — E785 Hyperlipidemia, unspecified: Secondary | ICD-10-CM | POA: Diagnosis present

## 2019-10-11 DIAGNOSIS — R748 Abnormal levels of other serum enzymes: Secondary | ICD-10-CM

## 2019-10-11 DIAGNOSIS — K5900 Constipation, unspecified: Secondary | ICD-10-CM | POA: Diagnosis present

## 2019-10-11 DIAGNOSIS — I4510 Unspecified right bundle-branch block: Secondary | ICD-10-CM | POA: Diagnosis present

## 2019-10-11 DIAGNOSIS — R29709 NIHSS score 9: Secondary | ICD-10-CM | POA: Diagnosis not present

## 2019-10-11 DIAGNOSIS — R4587 Impulsiveness: Secondary | ICD-10-CM | POA: Diagnosis not present

## 2019-10-11 DIAGNOSIS — R7401 Elevation of levels of liver transaminase levels: Secondary | ICD-10-CM | POA: Diagnosis present

## 2019-10-11 DIAGNOSIS — F10231 Alcohol dependence with withdrawal delirium: Secondary | ICD-10-CM | POA: Diagnosis not present

## 2019-10-11 DIAGNOSIS — R471 Dysarthria and anarthria: Secondary | ICD-10-CM | POA: Diagnosis present

## 2019-10-11 DIAGNOSIS — N179 Acute kidney failure, unspecified: Secondary | ICD-10-CM | POA: Diagnosis not present

## 2019-10-11 DIAGNOSIS — I63411 Cerebral infarction due to embolism of right middle cerebral artery: Secondary | ICD-10-CM | POA: Diagnosis not present

## 2019-10-11 DIAGNOSIS — F17210 Nicotine dependence, cigarettes, uncomplicated: Secondary | ICD-10-CM | POA: Diagnosis present

## 2019-10-11 DIAGNOSIS — I248 Other forms of acute ischemic heart disease: Secondary | ICD-10-CM | POA: Diagnosis present

## 2019-10-11 DIAGNOSIS — T501X6A Underdosing of loop [high-ceiling] diuretics, initial encounter: Secondary | ICD-10-CM | POA: Diagnosis present

## 2019-10-11 DIAGNOSIS — I110 Hypertensive heart disease with heart failure: Principal | ICD-10-CM | POA: Diagnosis present

## 2019-10-11 DIAGNOSIS — E119 Type 2 diabetes mellitus without complications: Secondary | ICD-10-CM

## 2019-10-11 DIAGNOSIS — I5023 Acute on chronic systolic (congestive) heart failure: Secondary | ICD-10-CM | POA: Diagnosis present

## 2019-10-11 DIAGNOSIS — R4701 Aphasia: Secondary | ICD-10-CM | POA: Diagnosis present

## 2019-10-11 DIAGNOSIS — G9341 Metabolic encephalopathy: Secondary | ICD-10-CM | POA: Diagnosis not present

## 2019-10-11 DIAGNOSIS — Z20822 Contact with and (suspected) exposure to covid-19: Secondary | ICD-10-CM | POA: Diagnosis present

## 2019-10-11 DIAGNOSIS — I255 Ischemic cardiomyopathy: Secondary | ICD-10-CM | POA: Diagnosis present

## 2019-10-11 DIAGNOSIS — Z8249 Family history of ischemic heart disease and other diseases of the circulatory system: Secondary | ICD-10-CM

## 2019-10-11 DIAGNOSIS — E871 Hypo-osmolality and hyponatremia: Secondary | ICD-10-CM | POA: Diagnosis not present

## 2019-10-11 DIAGNOSIS — I509 Heart failure, unspecified: Principal | ICD-10-CM

## 2019-10-11 DIAGNOSIS — R109 Unspecified abdominal pain: Secondary | ICD-10-CM

## 2019-10-11 DIAGNOSIS — E1165 Type 2 diabetes mellitus with hyperglycemia: Secondary | ICD-10-CM | POA: Diagnosis present

## 2019-10-11 DIAGNOSIS — F419 Anxiety disorder, unspecified: Secondary | ICD-10-CM | POA: Diagnosis not present

## 2019-10-11 DIAGNOSIS — R778 Other specified abnormalities of plasma proteins: Secondary | ICD-10-CM | POA: Diagnosis present

## 2019-10-11 DIAGNOSIS — F1910 Other psychoactive substance abuse, uncomplicated: Secondary | ICD-10-CM | POA: Diagnosis present

## 2019-10-11 DIAGNOSIS — Z79899 Other long term (current) drug therapy: Secondary | ICD-10-CM

## 2019-10-11 DIAGNOSIS — F1410 Cocaine abuse, uncomplicated: Secondary | ICD-10-CM | POA: Diagnosis present

## 2019-10-11 DIAGNOSIS — F319 Bipolar disorder, unspecified: Secondary | ICD-10-CM | POA: Diagnosis present

## 2019-10-11 DIAGNOSIS — E519 Thiamine deficiency, unspecified: Secondary | ICD-10-CM | POA: Diagnosis present

## 2019-10-11 DIAGNOSIS — F259 Schizoaffective disorder, unspecified: Secondary | ICD-10-CM | POA: Diagnosis present

## 2019-10-11 HISTORY — DX: Cocaine abuse, uncomplicated: F14.10

## 2019-10-11 HISTORY — DX: Unspecified systolic (congestive) heart failure: I50.20

## 2019-10-11 HISTORY — DX: Tobacco use: Z72.0

## 2019-10-11 LAB — BASIC METABOLIC PANEL
Anion gap: 7 (ref 5–15)
BUN: 27 mg/dL — ABNORMAL HIGH (ref 6–20)
CO2: 24 mmol/L (ref 22–32)
Calcium: 8.7 mg/dL — ABNORMAL LOW (ref 8.9–10.3)
Chloride: 110 mmol/L (ref 98–111)
Creatinine, Ser: 1.23 mg/dL (ref 0.61–1.24)
GFR calc Af Amer: >60 mL/min (ref >60)
GFR calc non Af Amer: >60 mL/min (ref >60)
Glucose, Bld: 135 mg/dL — ABNORMAL HIGH (ref 70–99)
Potassium: 4.4 mmol/L (ref 3.5–5.1)
Sodium: 141 mmol/L (ref 135–145)

## 2019-10-11 LAB — CBC
HCT: 39.5 % (ref 39.0–52.0)
Hemoglobin: 12 g/dL — ABNORMAL LOW (ref 13.0–17.0)
MCH: 27.1 pg (ref 26.0–34.0)
MCHC: 30.4 g/dL (ref 30.0–36.0)
MCV: 89.4 fL (ref 80.0–100.0)
Platelets: 210 10*3/uL (ref 150–400)
RBC: 4.42 MIL/uL (ref 4.22–5.81)
RDW: 14.1 % (ref 11.5–15.5)
WBC: 4.6 10*3/uL (ref 4.0–10.5)
nRBC: 0 % (ref 0.0–0.2)

## 2019-10-11 LAB — TROPONIN I (HIGH SENSITIVITY): Troponin I (High Sensitivity): 25 ng/L — ABNORMAL HIGH (ref <18)

## 2019-10-11 LAB — BRAIN NATRIURETIC PEPTIDE: B Natriuretic Peptide: 2015.6 pg/mL — ABNORMAL HIGH (ref 0.0–100.0)

## 2019-10-11 LAB — SARS CORONAVIRUS 2 BY RT PCR (HOSPITAL ORDER, PERFORMED IN ~~LOC~~ HOSPITAL LAB): SARS Coronavirus 2: NEGATIVE

## 2019-10-11 NOTE — ED Triage Notes (Signed)
Pt here for cp/shortness of breath x 2 days. Hx of CHF, out of his lasix for one month. Chest pain worse when lying down and with exertion. Dry cough present. 324 ASA and one nitro given by EMS with some relief in pain. No swelling in legs or abdomen.
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
## 2019-10-12 ENCOUNTER — Encounter (HOSPITAL_COMMUNITY): Payer: Self-pay | Admitting: Internal Medicine

## 2019-10-12 ENCOUNTER — Inpatient Hospital Stay (HOSPITAL_COMMUNITY): Payer: Medicaid Other

## 2019-10-12 ENCOUNTER — Emergency Department (HOSPITAL_COMMUNITY): Payer: Medicaid Other

## 2019-10-12 DIAGNOSIS — R4587 Impulsiveness: Secondary | ICD-10-CM | POA: Diagnosis not present

## 2019-10-12 DIAGNOSIS — I340 Nonrheumatic mitral (valve) insufficiency: Secondary | ICD-10-CM

## 2019-10-12 DIAGNOSIS — I248 Other forms of acute ischemic heart disease: Secondary | ICD-10-CM | POA: Diagnosis present

## 2019-10-12 DIAGNOSIS — I639 Cerebral infarction, unspecified: Secondary | ICD-10-CM | POA: Diagnosis not present

## 2019-10-12 DIAGNOSIS — E519 Thiamine deficiency, unspecified: Secondary | ICD-10-CM | POA: Diagnosis present

## 2019-10-12 DIAGNOSIS — E871 Hypo-osmolality and hyponatremia: Secondary | ICD-10-CM | POA: Diagnosis not present

## 2019-10-12 DIAGNOSIS — F1410 Cocaine abuse, uncomplicated: Secondary | ICD-10-CM | POA: Diagnosis present

## 2019-10-12 DIAGNOSIS — R778 Other specified abnormalities of plasma proteins: Secondary | ICD-10-CM

## 2019-10-12 DIAGNOSIS — I5023 Acute on chronic systolic (congestive) heart failure: Secondary | ICD-10-CM

## 2019-10-12 DIAGNOSIS — Z91138 Patient's unintentional underdosing of medication regimen for other reason: Secondary | ICD-10-CM | POA: Diagnosis not present

## 2019-10-12 DIAGNOSIS — E119 Type 2 diabetes mellitus without complications: Secondary | ICD-10-CM | POA: Diagnosis not present

## 2019-10-12 DIAGNOSIS — I361 Nonrheumatic tricuspid (valve) insufficiency: Secondary | ICD-10-CM | POA: Diagnosis not present

## 2019-10-12 DIAGNOSIS — Z20822 Contact with and (suspected) exposure to covid-19: Secondary | ICD-10-CM | POA: Diagnosis present

## 2019-10-12 DIAGNOSIS — I63411 Cerebral infarction due to embolism of right middle cerebral artery: Secondary | ICD-10-CM | POA: Diagnosis not present

## 2019-10-12 DIAGNOSIS — E1165 Type 2 diabetes mellitus with hyperglycemia: Secondary | ICD-10-CM | POA: Diagnosis present

## 2019-10-12 DIAGNOSIS — I428 Other cardiomyopathies: Secondary | ICD-10-CM

## 2019-10-12 DIAGNOSIS — R471 Dysarthria and anarthria: Secondary | ICD-10-CM | POA: Diagnosis present

## 2019-10-12 DIAGNOSIS — R7401 Elevation of levels of liver transaminase levels: Secondary | ICD-10-CM

## 2019-10-12 DIAGNOSIS — R7989 Other specified abnormal findings of blood chemistry: Secondary | ICD-10-CM | POA: Diagnosis not present

## 2019-10-12 DIAGNOSIS — I255 Ischemic cardiomyopathy: Secondary | ICD-10-CM | POA: Diagnosis present

## 2019-10-12 DIAGNOSIS — F259 Schizoaffective disorder, unspecified: Secondary | ICD-10-CM | POA: Diagnosis present

## 2019-10-12 DIAGNOSIS — I110 Hypertensive heart disease with heart failure: Secondary | ICD-10-CM | POA: Diagnosis present

## 2019-10-12 DIAGNOSIS — F1910 Other psychoactive substance abuse, uncomplicated: Secondary | ICD-10-CM

## 2019-10-12 DIAGNOSIS — I4510 Unspecified right bundle-branch block: Secondary | ICD-10-CM | POA: Diagnosis present

## 2019-10-12 DIAGNOSIS — R748 Abnormal levels of other serum enzymes: Secondary | ICD-10-CM

## 2019-10-12 DIAGNOSIS — N179 Acute kidney failure, unspecified: Secondary | ICD-10-CM | POA: Diagnosis not present

## 2019-10-12 DIAGNOSIS — F10231 Alcohol dependence with withdrawal delirium: Secondary | ICD-10-CM | POA: Diagnosis not present

## 2019-10-12 DIAGNOSIS — R4701 Aphasia: Secondary | ICD-10-CM | POA: Diagnosis present

## 2019-10-12 DIAGNOSIS — F319 Bipolar disorder, unspecified: Secondary | ICD-10-CM | POA: Diagnosis present

## 2019-10-12 DIAGNOSIS — F1990 Other psychoactive substance use, unspecified, uncomplicated: Secondary | ICD-10-CM | POA: Diagnosis not present

## 2019-10-12 DIAGNOSIS — F419 Anxiety disorder, unspecified: Secondary | ICD-10-CM | POA: Diagnosis not present

## 2019-10-12 DIAGNOSIS — G9341 Metabolic encephalopathy: Secondary | ICD-10-CM | POA: Diagnosis not present

## 2019-10-12 DIAGNOSIS — T501X6A Underdosing of loop [high-ceiling] diuretics, initial encounter: Secondary | ICD-10-CM | POA: Diagnosis present

## 2019-10-12 LAB — HEMOGLOBIN A1C
Hgb A1c MFr Bld: 6.5 % — ABNORMAL HIGH (ref 4.8–5.6)
Mean Plasma Glucose: 139.85 mg/dL

## 2019-10-12 LAB — GLUCOSE, CAPILLARY
Glucose-Capillary: 218 mg/dL — ABNORMAL HIGH (ref 70–99)
Glucose-Capillary: 205 mg/dL — ABNORMAL HIGH (ref 70–99)

## 2019-10-12 LAB — ECHOCARDIOGRAM COMPLETE
Area-P 1/2: 4.39 cm2
Height: 72 in
MV M vel: 5.1 m/s
MV Peak grad: 104 mmHg
S' Lateral: 6.5 cm
Single Plane A4C EF: 17.8 %
Weight: 2800 oz

## 2019-10-12 LAB — COMPREHENSIVE METABOLIC PANEL
ALT: 108 U/L — ABNORMAL HIGH (ref 0–44)
AST: 37 U/L (ref 15–41)
Albumin: 3.3 g/dL — ABNORMAL LOW (ref 3.5–5.0)
Alkaline Phosphatase: 147 U/L — ABNORMAL HIGH (ref 38–126)
Anion gap: 10 (ref 5–15)
BUN: 24 mg/dL — ABNORMAL HIGH (ref 6–20)
CO2: 20 mmol/L — ABNORMAL LOW (ref 22–32)
Calcium: 8.4 mg/dL — ABNORMAL LOW (ref 8.9–10.3)
Chloride: 107 mmol/L (ref 98–111)
Creatinine, Ser: 0.93 mg/dL (ref 0.61–1.24)
GFR calc Af Amer: >60 mL/min (ref >60)
GFR calc non Af Amer: >60 mL/min (ref >60)
Glucose, Bld: 125 mg/dL — ABNORMAL HIGH (ref 70–99)
Potassium: 4.3 mmol/L (ref 3.5–5.1)
Sodium: 137 mmol/L (ref 135–145)
Total Bilirubin: 0.6 mg/dL (ref 0.3–1.2)
Total Protein: 6.3 g/dL — ABNORMAL LOW (ref 6.5–8.1)

## 2019-10-12 LAB — RAPID URINE DRUG SCREEN, HOSP PERFORMED
Amphetamines: NOT DETECTED
Barbiturates: NOT DETECTED
Benzodiazepines: NOT DETECTED
Cocaine: NOT DETECTED
Opiates: NOT DETECTED
Tetrahydrocannabinol: NOT DETECTED

## 2019-10-12 LAB — LIPASE, BLOOD: Lipase: 69 U/L — ABNORMAL HIGH (ref 11–51)

## 2019-10-12 LAB — PROTIME-INR
INR: 1.2 (ref 0.8–1.2)
Prothrombin Time: 14.8 seconds (ref 11.4–15.2)

## 2019-10-12 LAB — HIV ANTIBODY (ROUTINE TESTING W REFLEX): HIV Screen 4th Generation wRfx: NONREACTIVE

## 2019-10-12 LAB — BILIRUBIN, DIRECT: Bilirubin, Direct: 0.2 mg/dL (ref 0.0–0.2)

## 2019-10-12 LAB — TROPONIN I (HIGH SENSITIVITY): Troponin I (High Sensitivity): 24 ng/L — ABNORMAL HIGH (ref <18)

## 2019-10-12 MED ORDER — LORAZEPAM 2 MG/ML IJ SOLN
1.0000 mg | INTRAMUSCULAR | Status: AC | PRN
  Administered 2019-10-14 (×2): 2 mg via INTRAVENOUS
  Filled 2019-10-12 (×2): qty 1

## 2019-10-12 MED ORDER — THIAMINE HCL 100 MG/ML IJ SOLN
100.0000 mg | Freq: Every day | INTRAMUSCULAR | Status: DC
  Filled 2019-10-12: qty 2

## 2019-10-12 MED ORDER — ACETAMINOPHEN 325 MG PO TABS
650.0000 mg | ORAL_TABLET | ORAL | Status: DC | PRN
  Administered 2019-10-17 – 2019-11-01 (×15): 650 mg via ORAL
  Filled 2019-10-12 (×16): qty 2

## 2019-10-12 MED ORDER — THIAMINE HCL 100 MG PO TABS
100.0000 mg | ORAL_TABLET | Freq: Every day | ORAL | Status: DC
  Administered 2019-10-12 – 2019-10-16 (×4): 100 mg via ORAL
  Filled 2019-10-12 (×5): qty 1

## 2019-10-12 MED ORDER — ENOXAPARIN SODIUM 40 MG/0.4ML ~~LOC~~ SOLN
40.0000 mg | SUBCUTANEOUS | Status: DC
  Administered 2019-10-12 – 2019-10-22 (×10): 40 mg via SUBCUTANEOUS
  Filled 2019-10-12 (×10): qty 0.4

## 2019-10-12 MED ORDER — FUROSEMIDE 10 MG/ML IJ SOLN
40.0000 mg | Freq: Two times a day (BID) | INTRAMUSCULAR | Status: DC
  Administered 2019-10-12: 40 mg via INTRAVENOUS
  Filled 2019-10-12 (×2): qty 4

## 2019-10-12 MED ORDER — ADULT MULTIVITAMIN W/MINERALS CH
1.0000 | ORAL_TABLET | Freq: Every day | ORAL | Status: DC
  Administered 2019-10-12 – 2019-11-02 (×21): 1 via ORAL
  Filled 2019-10-12 (×23): qty 1

## 2019-10-12 MED ORDER — FOLIC ACID 1 MG PO TABS
1.0000 mg | ORAL_TABLET | Freq: Every day | ORAL | Status: DC
  Administered 2019-10-12 – 2019-11-02 (×21): 1 mg via ORAL
  Filled 2019-10-12 (×23): qty 1

## 2019-10-12 MED ORDER — SODIUM CHLORIDE 0.9% FLUSH
3.0000 mL | Freq: Two times a day (BID) | INTRAVENOUS | Status: DC
  Administered 2019-10-12 – 2019-10-24 (×23): 3 mL via INTRAVENOUS

## 2019-10-12 MED ORDER — FUROSEMIDE 10 MG/ML IJ SOLN
40.0000 mg | Freq: Once | INTRAMUSCULAR | Status: AC
  Administered 2019-10-12: 40 mg via INTRAVENOUS
  Filled 2019-10-12: qty 4

## 2019-10-12 MED ORDER — ASPIRIN 81 MG PO CHEW
81.0000 mg | CHEWABLE_TABLET | Freq: Every day | ORAL | Status: DC
  Administered 2019-10-12 – 2019-10-23 (×11): 81 mg via ORAL
  Filled 2019-10-12 (×13): qty 1

## 2019-10-12 MED ORDER — DIGOXIN 125 MCG PO TABS
0.1250 mg | ORAL_TABLET | Freq: Every day | ORAL | Status: DC
  Administered 2019-10-12 – 2019-11-02 (×21): 0.125 mg via ORAL
  Filled 2019-10-12 (×24): qty 1

## 2019-10-12 MED ORDER — SPIRONOLACTONE 12.5 MG HALF TABLET
12.5000 mg | ORAL_TABLET | Freq: Every day | ORAL | Status: DC
  Administered 2019-10-12 – 2019-10-13 (×2): 12.5 mg via ORAL
  Filled 2019-10-12 (×3): qty 1

## 2019-10-12 MED ORDER — PANTOPRAZOLE SODIUM 40 MG IV SOLR
40.0000 mg | Freq: Once | INTRAVENOUS | Status: AC
  Administered 2019-10-12: 40 mg via INTRAVENOUS
  Filled 2019-10-12: qty 40

## 2019-10-12 MED ORDER — INSULIN ASPART 100 UNIT/ML ~~LOC~~ SOLN
0.0000 [IU] | Freq: Every day | SUBCUTANEOUS | Status: DC
  Administered 2019-10-12 – 2019-10-13 (×2): 2 [IU] via SUBCUTANEOUS

## 2019-10-12 MED ORDER — LORAZEPAM 1 MG PO TABS: 1.0000 mg | ORAL_TABLET | ORAL | Status: AC | PRN

## 2019-10-12 MED ORDER — INSULIN ASPART 100 UNIT/ML ~~LOC~~ SOLN
0.0000 [IU] | Freq: Three times a day (TID) | SUBCUTANEOUS | Status: DC
  Administered 2019-10-13 – 2019-10-15 (×4): 2 [IU] via SUBCUTANEOUS
  Administered 2019-10-16 – 2019-10-17 (×2): 1 [IU] via SUBCUTANEOUS
  Administered 2019-10-17 – 2019-10-18 (×2): 2 [IU] via SUBCUTANEOUS
  Administered 2019-10-19: 3 [IU] via SUBCUTANEOUS
  Administered 2019-10-20: 2 [IU] via SUBCUTANEOUS
  Administered 2019-10-20: 3 [IU] via SUBCUTANEOUS
  Administered 2019-10-21: 2 [IU] via SUBCUTANEOUS
  Administered 2019-10-22 – 2019-10-24 (×3): 1 [IU] via SUBCUTANEOUS
  Administered 2019-10-24: 3 [IU] via SUBCUTANEOUS
  Administered 2019-10-25: 1 [IU] via SUBCUTANEOUS
  Administered 2019-10-25 – 2019-10-26 (×2): 2 [IU] via SUBCUTANEOUS
  Administered 2019-10-27 – 2019-10-28 (×3): 1 [IU] via SUBCUTANEOUS
  Administered 2019-10-29: 2 [IU] via SUBCUTANEOUS
  Administered 2019-10-29 – 2019-10-31 (×4): 1 [IU] via SUBCUTANEOUS

## 2019-10-12 MED ORDER — SODIUM CHLORIDE 0.9 % IV SOLN
250.0000 mL | INTRAVENOUS | Status: DC | PRN
  Administered 2019-10-16: 250 mL via INTRAVENOUS

## 2019-10-12 MED ORDER — NICOTINE POLACRILEX 2 MG MT GUM
2.0000 mg | CHEWING_GUM | OROMUCOSAL | Status: DC | PRN
  Filled 2019-10-12: qty 1

## 2019-10-12 MED ORDER — SODIUM CHLORIDE 0.9% FLUSH
3.0000 mL | INTRAVENOUS | Status: DC | PRN
  Administered 2019-10-12: 3 mL via INTRAVENOUS

## 2019-10-12 NOTE — H&P (Addendum)
History and Physical    Kerry Hamilton VFI:[id number] DOB: November 12, 1965 DOA: 10/11/2019  Referring MD/NP/PA: Towanda Octave, MD (resident) PCP: Medicine, [facility]  Patient coming from: Home  Chief Complaint: Shortness of breath and chest pain  I have personally briefly reviewed patient's old medical records in [facility]   HPI: Kerry Hamilton is a 54 y.o. male with medical history significant of systolic congestive heart failure due to an ICM with EF 15%, diabetes mellitus type 2, and tobacco/alcohol/cocaine abuse presents with complaints of shortness of breath and chest pain for the last 3 days.  Chest pain is sharp in nature and located on the left side but we will radiate into the center of his chest.  Shortness of breath and chest pain symptoms seem to be worsened whenever laying down.  He reports that he ran out of his diuretics in the last month.  Patient he had not been following up with cardiology in outpatient setting.  He is still smokes half a pack cigarettes per day on average, drinks 3 to 4 "forties" per week on average, and last smoked "crack" approximately 3 days ago.  ED Course: Upon admission into the emergency department patient was noted to be afebrile, respirations 16-25, pulse 58-70, and all other vital signs maintained.  Labs from 9/16-9/17 significant for hemoglobin 12, BUN 27, creatinine 1.23->0.93, BNP 2015.6, and troponin 25->24.  Chest x-ray revealed cardiomegaly with mild interstitial edema.  Patient had been given 40 mg of Lasix IV.   Review of Systems  Constitutional: Positive for malaise/fatigue. Negative for fever.  HENT: Positive for congestion. Negative for ear pain.   Eyes: Negative for photophobia and pain.  Respiratory: Positive for cough and shortness of breath.   Cardiovascular: Positive for chest pain. Negative for leg swelling.  Gastrointestinal: Positive for abdominal pain. Negative for nausea and vomiting.  Genitourinary: Negative for  dysuria and hematuria.  Musculoskeletal: Positive for myalgias. Negative for falls.  Skin: Negative for itching.  Neurological: Negative for focal weakness and loss of consciousness.  Psychiatric/Behavioral: Positive for substance abuse.    Past Medical History:  Diagnosis Date  . Depression   . Diabetes mellitus without complication [facility])     Past Surgical History:  Procedure Laterality Date  . RIGHT/LEFT HEART CATH AND CORONARY ANGIOGRAPHY N/A 06/13/2018   Procedure: RIGHT/LEFT HEART CATH AND CORONARY ANGIOGRAPHY;  Surgeon: [place], Peter M, MD;  Location: [facility] INVASIVE CV LAB;  Service: Cardiovascular;  Laterality: N/A;     reports that he has been smoking cigarettes and cigars. He has been smoking about 1.00 pack per day. He has never used smokeless tobacco. He reports current alcohol use of about 6.0 standard drinks of alcohol per week. He reports current drug use. Drug: "Crack" cocaine.  No Known Allergies  Family History  Problem Relation Age of Onset  . CAD Paternal Grandmother   . Diabetes Mellitus II Neg Hx     Prior to Admission medications   Medication Sig Start Date End Date Taking? Authorizing Provider  carvedilol (COREG) 3.125 MG tablet Take 1 tablet (3.125 mg total) by mouth 2 (two) times daily with a meal. 07/03/19 10/01/19  Robbie Lis M, PA-C  FLUoxetine (PROZAC) 20 MG capsule Take 1 capsule (20 mg total) by mouth daily. 10/10/18   Aldean Baker, NP  furosemide (LASIX) 40 MG tablet Take 1 tablet (40 mg total) by mouth daily. 07/03/19   Robbie Lis M, PA-C  losartan (COZAAR) 25 MG tablet Take 0.5 tablets (  12.5 mg total) by mouth daily. 07/03/19 10/01/19  Robbie Lis M, PA-C  pantoprazole (PROTONIX) 40 MG tablet TAKE 1 TABLET(40 MG) BY MOUTH DAILY 07/05/19   Robbie Lis M, PA-C  QUEtiapine (SEROQUEL) 50 MG tablet Take 1 tablet (50 mg total) by mouth at bedtime. 10/09/18   Aldean Baker, NP  spironolactone (ALDACTONE) 25 MG tablet Take 0.5 tablets  (12.5 mg total) by mouth daily. 07/03/19 10/01/19  Allayne Butcher, PA-C    Physical Exam:  Constitutional: Middle-age male who appears to be in no acute distress at this time. Vitals:   10/12/19 0820 10/12/19 1100 10/12/19 1115 10/12/19 1145  BP: 132/82 (!) 142/100 (!) 138/91   Pulse: 70 (!) 59 (!) 59 (!) 56  Resp: 18 19 (!) 25 17  Temp: 98.2 F (36.8 C)     TempSrc: Oral     SpO2: 100% 100% 100% 100%  Weight:      Height:       Eyes: PERRL, lids and conjunctivae normal ENMT: Mucous membranes are moist. Posterior pharynx clear of any exudate or lesions.Normal dentition.  Neck: normal, supple, no masses, no thyromegaly Respiratory: Mild crackles heard on the lower lung fields.  No significant wheezes or rhonchi appreciated.  Patient maintaining O2 saturations on room air. Cardiovascular: Bradycardic with 2/6 systolic murmur.  No extremity edema. 2+ pedal pulses. No carotid bruits.  Abdomen: no tenderness, no masses palpated. No hepatosplenomegaly. Bowel sounds positive.  Musculoskeletal: no clubbing / cyanosis. No joint deformity upper and lower extremities. Good ROM, no contractures. Normal muscle tone.  Skin: no rashes, lesions, ulcers. No induration Neurologic: CN 2-12 grossly intact. Sensation intact, DTR normal. Strength 5/5 in all 4.  Psychiatric: Normal judgment and insight. Alert and oriented x 3. Normal mood.     Labs on Admission: I have personally reviewed following labs and imaging studies  CBC: Recent Labs  Lab 10/11/19 1645  WBC 4.6  HGB 12.0*  HCT 39.5  MCV 89.4  PLT 210   Basic Metabolic Panel: Recent Labs  Lab 10/11/19 1645 10/12/19 1130  NA 141 137  K 4.4 4.3  CL 110 107  CO2 24 20*  GLUCOSE 135* 125*  BUN 27* 24*  CREATININE 1.23 0.93  CALCIUM 8.7* 8.4*   GFR: Estimated Creatinine Clearance: 100.8 mL/min (by C-G formula based on SCr of 0.93 mg/dL). Liver Function Tests: Recent Labs  Lab 10/12/19 1130  AST 37  ALT 108*  ALKPHOS 147*    BILITOT 0.6  PROT 6.3*  ALBUMIN 3.3*   Recent Labs  Lab 10/12/19 1130  LIPASE 69*   No results for input(s): AMMONIA in the last 168 hours. Coagulation Profile: Recent Labs  Lab 10/12/19 1130  INR 1.2   Cardiac Enzymes: No results for input(s): CKTOTAL, CKMB, CKMBINDEX, TROPONINI in the last 168 hours. BNP (last 3 results) No results for input(s): PROBNP in the last 8760 hours. HbA1C: No results for input(s): HGBA1C in the last 72 hours. CBG: No results for input(s): GLUCAP in the last 168 hours. Lipid Profile: No results for input(s): CHOL, HDL, LDLCALC, TRIG, CHOLHDL, LDLDIRECT in the last 72 hours. Thyroid Function Tests: No results for input(s): TSH, T4TOTAL, FREET4, T3FREE, THYROIDAB in the last 72 hours. Anemia Panel: No results for input(s): VITAMINB12, FOLATE, FERRITIN, TIBC, IRON, RETICCTPCT in the last 72 hours. Urine analysis:    Component Value Date/Time   COLORURINE YELLOW 06/10/2018 1614   APPEARANCEUR CLEAR 06/10/2018 1614   LABSPEC 1.032 (H) 06/10/2018 1614  PHURINE 5.0 06/10/2018 1614   GLUCOSEU NEGATIVE 06/10/2018 1614   HGBUR NEGATIVE 06/10/2018 1614   BILIRUBINUR NEGATIVE 06/10/2018 1614   KETONESUR NEGATIVE 06/10/2018 1614   PROTEINUR 100 (A) 06/10/2018 1614   NITRITE NEGATIVE 06/10/2018 1614   LEUKOCYTESUR NEGATIVE 06/10/2018 1614   Sepsis Labs: Recent Results (from the past 240 hour(s))  SARS Coronavirus 2 by RT PCR (hospital order, performed in [facility] hospital lab) Nasopharyngeal Nasopharyngeal Swab     Status: None   Collection Time: 10/11/19  4:45 PM   Specimen: Nasopharyngeal Swab  Result Value Ref Range Status   SARS Coronavirus 2 NEGATIVE NEGATIVE Final    Comment: (NOTE) SARS-CoV-2 target nucleic acids are NOT DETECTED.  The SARS-CoV-2 RNA is generally detectable in upper and lower respiratory specimens during the acute phase of infection. The lowest concentration of SARS-CoV-2 viral copies this assay can detect is  250 copies / mL. A negative result does not preclude SARS-CoV-2 infection and should not be used as the sole basis for treatment or other patient management decisions.  A negative result may occur with improper specimen collection / handling, submission of specimen other than nasopharyngeal swab, presence of viral mutation(s) within the areas targeted by this assay, and inadequate number of viral copies (<250 copies / mL). A negative result must be combined with clinical observations, patient history, and epidemiological information.  Fact Sheet for Patients:   BoilerBrush.com.cy  Fact Sheet for Healthcare Providers: https://pope.com/  This test is not yet approved or  cleared by the [place] FDA and has been authorized for detection and/or diagnosis of SARS-CoV-2 by FDA under an Emergency Use Authorization (EUA).  This EUA will remain in effect (meaning this test can be used) for the duration of the COVID-19 declaration under Section 564(b)(1) of the Act, 21 U.S.C. section 360bbb-3(b)(1), unless the authorization is terminated or revoked sooner.  Performed at [facility], [address] [place], [place] [address]      Radiological Exams on Admission: DG Chest 2 View  Result Date: 10/11/2019 CLINICAL DATA:  Chest pain EXAM: CHEST - 2 VIEW COMPARISON:  Radiograph 04/17/2019 FINDINGS: Some mild interstitial opacity and fissural thickening with slight central vascular congestion. No consolidation, pneumothorax, or effusion. Cardiomegaly is similar to prior counting for differences in the PA versus AP technique on today's examination. No acute osseous or soft tissue abnormality. IMPRESSION: 1. Findings suggest mild interstitial edema. 2. Cardiomegaly, similar to prior. Electronically Signed   By: Kreg Shropshire M.D.   On: 10/11/2019 17:52    EKG: Independently reviewed.  Sinus rhythm at 63 bpm with QTC of 480 and right bundle  branch block similar to previous tracing.  Assessment/Plan Systolic congestive heart failure: Acute on chronic.  Patient presents with complaints of 3 days of shortness of breath and chest pain.  Chest x-ray noting cardiomegaly with mild interstitial edema.  BNP 2015.6.  Last echocardiogram from 2020 revealed EF of approximately 20%.  Patient had been given 40 mg of Lasix IV significant diuresis. -Admit to a telemetry bed -Heart failure orders set  initiated  -Continuous pulse oximetry with nasal cannula oxygen as needed to keep O2 saturations >92% -Strict I&Os and daily weights -Elevate lower extremities -Lasix 40 mg IV Bid or as recommended by cardiology -Reassess in a.m. and adjust diuresis as needed. -Check echocardiogram -Did not initially restart beta-blocker as patient appears to be acutely decompensated -Start Spironolactone and digoxin per cardiology -Cardiology consulted, will follow-up for any further recommendations  Elevated  troponin  nonischemic cardiomyopathy: Acute.  Patient presents with complaints of chest pain.  High-sensitivity troponin 25->24 with EKG being similar to previous.  Patient last cardiac cath on 06/13/2018 noted no significant coronary artery disease.  Suspect symptoms secondary to demand. -Continue to monitor  Diabetes mellitus type II, uncontrolled: On admission glucose is mildly elevated at 135.  Last hemoglobin A1c was noted to be 8.1 on 10/06/2018.  Patient had not been taking any diabetic medications at home. -Hypoglycemic protocol -CBGs before every meal with sensitive SSI -Adjust insulin regimen as needed  Elevated lipase and elevated alkaline phosphatase: Acute.  Lipase 69 and ALT 108 on admission. patient had been complaining of significant abdominal pain.  Question if this is secondary to patient's alcohol use vs pancreatitis vs hepatic congestion.  Orders had initially been placed for a CT scan of the abdomen, but were discontinued. -Check  abdominal ultrasound   Polysubstance abuse: Patient reports smoking half pack cigarettes per day on average, drinks 3-4 "forties" per week, and last smoked crack cocaine 3 days ago. -Follow-up UDS -CIWA protocols initiated  -Nicotine gum offered -Counseled on the need of cessation of tobacco, alcohol use, and illicit drug use -Transitions of of care consult  DVT prophylaxis: Lovenox Code Status: full  Family Communication: Sister was not available by phone, but left message regarding brother being in the hospital Disposition Plan: Likely discharge home in 2 to 3 days Consults called: Cardiology  Admission status: Inpatient admission due to need of IV diuresis  Clydie Braun MD [facility] Pager [phone]   If 7PM-7AM, please contact night-coverage [email or web address] Password [facility]  10/12/2019, 1:49 PM

## 2019-10-12 NOTE — Consult Note (Addendum)
Advanced Heart Failure Team Consult Note   Primary Physician: Medicine, [facility] PCP-Cardiologist:  Kerry Casino, MD  [facility]: Dr. Haroldine Hamilton   Reason for Consultation: Acute on Chronic Systolic Heart Failure   HPI:    Kerry Hamilton is seen today for evaluation of acute on chronic systolic heart failure at the request of Dr. Tamala Hamilton, Internal Medicine.   Kerry Hamilton a 54 y.o.malewith a past medial history significant for schizophrenia, tobacco/ETOH abuse, diabetes and systolic HF due to NICM with EF 15%, initially referred by Kerry Hamilton for further evaluation of HF.   He presented in May 2020 with progressive shortness of breath with minimal activity was found to be in congestive heart failure with elevated BNP and troponin. Echo LVEF 15% with severely dilated LV and pseudonormalization of diastolic function. Ultimately underwent right and left heart catheterization after diuresis, which demonstrated no significant coronary disease with mildly elevated LV filling pressures and a cardiac index of 2.2.   Had initial [facility] consultation w/ Dr. Haroldine Hamilton 10/20. Fluid status was ok. Meds were titrated. Entresto increased to 49/51 bid and spironolactone added. Unfortunately, he failed to f/u after his initial visit.    He was seen in the ED in March 2021 with SOB and CP and found to be mildly volume overloaded. This was in the setting of poor compliance w/ home lasix and cocaine and ETOH use. HS trops were flat, 27>>28. BNP 1,169.  He was treated w/ dose of IV Lasix in the ED w/ improvement in symptoms and did not require admission. He was given new Rx for PO Lasix and instructed to f/u as outpatient.   Seen back in [facility] 6/21 and w/ NYHA Class II symptoms. Wt and volume stable. HR and BP well controlled. Had reported compliance w/ Coreg and Lasix but never picked up Entresto nor spiro from pharmacy. Admitted to continued polysubstance abuse, using beer, cocaine and tobacco. No  return f/u since that time. He no-showed to his f/u w/ PharmD for med titration.   Now presents to the [facility] w/ CC of SOB and CP. Per pt report, he still smokes half a pack cigarettes per day on average, drinks 3 to 4 "forties" per week on average, and last smoked "crack" approximately 3 days ago. He had been taking both lasix and coreg but ran out of refills approx 1 month ago and was unable to get to pharmacy for refills due to transportation issues. After stopping his lasix, he developed SOB that has progressively worsened over the last 4 weeks. SOB w/ minimal exertion. Improves w/ rest. Also complains of orthopnea and PND. Chest pain is intermittent, atypical sharp pain w/ some radiation to left shoulder. Currently pain free.   In ED, he is found to be in acute CHF. BNP 2015. CXR w/ mild interstitial edema. Hs troponin low level and flat, 25>>24. EKG NSR w/ RBBB, RVH and inferolateral TWIs, c/w prior EKGs. BP mildly elevated 157W systolic. CBC unremarkable. SCr normal at 0.93. K 4.3. He has been admitted by IM and started on IV Lasix, 40 mg bid. Good diuresis thus far. Currently comfortable at rest. ReDs clip mildly elevated at 38%.  Also w/ abnormal Hepatic Enzymes and elevated Lipase (69) - ALT elevated at 108 - Alk Phos 147 - AST and bili normal    Echo 05/2018 EF 15%, RV moderately reduced   Review of Systems: [y] = yes, [ ] = no   . General: Weight gain [ ];  Weight loss [ ]; Anorexia [ ]; Fatigue [ ]; Fever [ ]; Chills [ ]; Weakness [ ]  . Cardiac: Chest pain/pressure [ Y]; Resting SOB [ ]; Exertional SOB [Y ]; Orthopnea [ Y]; Pedal Edema [ ]; Palpitations [ ]; Syncope [ ]; Presyncope [ ]; Paroxysmal nocturnal dyspnea[ Y]  . Pulmonary: Cough [ ]; Wheezing[ ]; Hemoptysis[ ]; Sputum [ ]; Snoring [ ]  . GI: Vomiting[ ]; Dysphagia[ ]; Melena[ ]; Hematochezia [ ]; Heartburn[ ]; Abdominal pain [ ]; Constipation [ ]; Diarrhea [ ]; BRBPR [ ]  . GU: Hematuria[ ]; Dysuria [ ]; Nocturia[ ]   . Vascular: Pain in legs with walking [ ]; Pain in feet with lying flat [ ]; Non-healing sores [ ]; Stroke [ ]; TIA [ ]; Slurred speech [ ];  . Neuro: Headaches[ ]; Vertigo[ ]; Seizures[ ]; Paresthesias[ ];Blurred vision [ ]; Diplopia [ ]; Vision changes [ ]  . Ortho/Skin: Arthritis [ ]; Joint pain [ ]; Muscle pain [ ]; Joint swelling [ ]; Back Pain [ ]; Rash [ ]  . Psych: Depression[ ]; Anxiety[ ]  . Heme: Bleeding problems [ ]; Clotting disorders [ ]; Anemia [ ]  . Endocrine: Diabetes [ ]; Thyroid dysfunction[ ]  Home Medications Prior to Admission medications   Medication Sig Start Date End Date Taking? Authorizing Provider  carvedilol (COREG) 3.125 MG tablet Take 1 tablet (3.125 mg total) by mouth 2 (two) times daily with a meal. Patient not taking: Reported on 10/12/2019 07/03/19 10/01/19  Kerry Jester M, PA-C  FLUoxetine (PROZAC) 20 MG capsule Take 1 capsule (20 mg total) by mouth daily. Patient not taking: Reported on 10/12/2019 10/10/18   Kerry Burkitt, NP  furosemide (LASIX) 40 MG tablet Take 1 tablet (40 mg total) by mouth daily. Patient not taking: Reported on 10/12/2019 07/03/19   Kerry Jester M, PA-C  losartan (COZAAR) 25 MG tablet Take 0.5 tablets (12.5 mg total) by mouth daily. 07/03/19 10/01/19  Kerry Jester M, PA-C  pantoprazole (PROTONIX) 40 MG tablet TAKE 1 TABLET(40 MG) BY MOUTH DAILY Patient not taking: Reported on 10/12/2019 07/05/19   Kerry Jester M, PA-C  QUEtiapine (SEROQUEL) 50 MG tablet Take 1 tablet (50 mg total) by mouth at bedtime. Patient not taking: Reported on 10/12/2019 10/09/18   Kerry Burkitt, NP  spironolactone (ALDACTONE) 25 MG tablet Take 0.5 tablets (12.5 mg total) by mouth daily. 07/03/19 10/01/19  Kerry Pandy, PA-C    Past Medical History: Past Medical History:  Diagnosis Date  . Cocaine abuse ([place])   . Depression   . Diabetes mellitus without complication ([place])   . Systolic congestive heart failure ([place])   . Tobacco abuse      Past Surgical History: Past Surgical History:  Procedure Laterality Date  . RIGHT/LEFT HEART CATH AND CORONARY ANGIOGRAPHY N/A 06/13/2018   Procedure: RIGHT/LEFT HEART CATH AND CORONARY ANGIOGRAPHY;  Surgeon: Martinique, Peter M, MD;  Location: [facility];  Service: Cardiovascular;  Laterality: N/A;    Family History: Family History  Problem Relation Age of Onset  . CAD Paternal Grandmother   . Diabetes Mellitus II Neg Hx     Social History: Social History   Socioeconomic History  . Marital status: Single    Spouse name: Not on file  . Number of children: Not on file  . Years of education: Not on file  . Highest education level: Not on file  Occupational History  . Not on file  Tobacco Use  . Smoking status: Current Every Day Smoker    Packs/day: 1.00    Types: Cigarettes, Cigars  . Smokeless tobacco: Never Used  Vaping Use  . Vaping Use: Never used  Substance and Sexual Activity  . Alcohol use: Yes    Alcohol/week: 6.0 standard drinks    Types: 6 Cans of beer per week    Comment: daily  . Drug use: Yes    Types: "Crack" cocaine  . Sexual activity: Not Currently  Other Topics Concern  . Not on file  Social History Narrative  . Not on file   Social Determinants of Health   Financial Resource Strain:   . Difficulty of Paying Living Expenses: Not on file  Food Insecurity:   . Worried About Charity fundraiser in the Last Year: Not on file  . Ran Out of Food in the Last Year: Not on file  Transportation Needs:   . Lack of Transportation (Medical): Not on file  . Lack of Transportation (Non-Medical): Not on file  Physical Activity:   . Days of Exercise per Week: Not on file  . Minutes of Exercise per Session: Not on file  Stress:   . Feeling of Stress : Not on file  Social Connections:   . Frequency of Communication with Friends and Family: Not on file  . Frequency of Social Gatherings with Friends and Family: Not on file  . Attends Religious  Services: Not on file  . Active Member of Clubs or Organizations: Not on file  . Attends Archivist Meetings: Not on file  . Marital Status: Not on file    Allergies:  No Known Allergies  Objective:    Vital Signs:   Temp:  [97.5 F (36.4 C)-98.7 F (37.1 C)] 98.2 F (36.8 C) (09/17 1543) Pulse Rate:  [56-70] 58 (09/17 1543) Resp:  [15-25] 15 (09/17 1543) BP: (129-142)/(82-100) 132/85 (09/17 1543) SpO2:  [100 %] 100 % (09/17 1543) Weight:  [79.4 kg] 79.4 kg (09/17 0319)    Weight change: Filed Weights   10/12/19 0319  Weight: 79.4 kg    Intake/Output:   Intake/Output Summary (Last 24 hours) at 10/12/2019 1549 Last data filed at 10/12/2019 1525 Gross per 24 hour  Intake --  Output 3200 ml  Net -3200 ml      Physical Exam    ReDS clip 38%.  General:  Well appearing. No resp difficulty HEENT: normal Neck: supple. JVP elevated to jaw . Carotids 2+ bilat; no bruits. No lymphadenopathy or thyromegaly appreciated. Cor: PMI nondisplaced. Regular rate & rhythm. No rubs, gallops or murmurs. Lungs: bibasilar crackles, R>L no wheezing  Abdomen: soft, nontender, nondistended. No hepatosplenomegaly. No bruits or masses. Good bowel sounds. Extremities: no cyanosis, clubbing, rash, edema, bilateral ankles and feet are cool to touch  Neuro: alert & orientedx3, cranial nerves grossly intact. moves all 4 extremities w/o difficulty. Affect pleasant   Telemetry   NSR 80s   EKG    NSR w/ RBBB, RVH and inferolateral TWIs, c/w prior EKGs  Labs   Basic Metabolic Panel: Recent Labs  Lab 10/11/19 1645 10/12/19 1130  NA 141 137  K 4.4 4.3  CL 110 107  CO2 24 20*  GLUCOSE 135* 125*  BUN 27* 24*  CREATININE 1.23 0.93  CALCIUM 8.7* 8.4*    Liver Function Tests: Recent Labs  Lab 10/12/19 1130  AST 37  ALT 108*  ALKPHOS 147*  BILITOT 0.6  PROT 6.3*  ALBUMIN 3.3*  Recent Labs  Lab 10/12/19 1130  LIPASE 69*   No results for input(s): AMMONIA in the  last 168 hours.  CBC: Recent Labs  Lab 10/11/19 1645  WBC 4.6  HGB 12.0*  HCT 39.5  MCV 89.4  PLT 210    Cardiac Enzymes: No results for input(s): CKTOTAL, CKMB, CKMBINDEX, TROPONINI in the last 168 hours.  BNP: BNP (last 3 results) Recent Labs    11/15/18 1418 04/17/19 1758 10/11/19 1645  BNP 292.9* 1,169.4* 2,015.6*    ProBNP (last 3 results) No results for input(s): PROBNP in the last 8760 hours.   CBG: No results for input(s): GLUCAP in the last 168 hours.  Coagulation Studies: Recent Labs    10/12/19 1130  LABPROT 14.8  INR 1.2     Imaging   DG Chest 2 View  Result Date: 10/11/2019 CLINICAL DATA:  Chest pain EXAM: CHEST - 2 VIEW COMPARISON:  Radiograph 04/17/2019 FINDINGS: Some mild interstitial opacity and fissural thickening with slight central vascular congestion. No consolidation, pneumothorax, or effusion. Cardiomegaly is similar to prior counting for differences in the PA versus AP technique on today's examination. No acute osseous or soft tissue abnormality. IMPRESSION: 1. Findings suggest mild interstitial edema. 2. Cardiomegaly, similar to prior. Electronically Signed   By: Lovena Le Hamilton.D.   On: 10/11/2019 17:52      Medications:     Current Medications: . enoxaparin (LOVENOX) injection  40 mg Subcutaneous Q24H  . folic acid  1 mg Oral Daily  . furosemide  40 mg Intravenous BID  . insulin aspart  0-5 Units Subcutaneous QHS  . insulin aspart  0-9 Units Subcutaneous TID WC  . multivitamin with minerals  1 tablet Oral Daily  . sodium chloride flush  3 mL Intravenous Q12H  . thiamine  100 mg Oral Daily   Or  . thiamine  100 mg Intravenous Daily     Infusions: . sodium chloride        Assessment/Plan   1. Acute on Chronic Systolic HF - NICM  - echo 5/20 EF 15%, RV moderately reduce  - R/LHC 5/20 showed no CAD and preserved CO, CI 2.2  - ? 2/2 hypertension vs polysubstance abuse (admits to regular ETOH and cocaine use) - now  admitted w/ a/c CHF, NYHA Class III, in the setting of poor med complaince and ongoing polysubstance abuse  - BNP 2,015. CXR w/ edema. ReDs clip 38% - Diurese w/ IV Lasix 40 mg BID. Follow BMP  - Repeat echo to reassess LVEF - gradually add back GDMT meds  - discussed importance of strict med compliance, improved clinic f/us and abstinence fromsubstance use - will involve SW to help w/ transportation to and from appts - refer to paramedicine - not a candidate for advanced therapies given poor compliance and active substance use    2. DM2 - HgbA1c 8.1 in 9/20 - repeat Hgb A1c pending  - Consider SGLT2i   3. Polysubstance abuse.  - pt admits that he smokes half a pack cigarettes per day on average, drinks 3 to 4 "forties" per week on average, and last smoked "crack" approximately 3 days - cessation from tobacco, cocaine and ETHO imperative   4. Chest Pain w/ minimally elevated Hs Troponin  - LHC 05/2018 showed no CAD - in setting of recent cocaine use - may also be 2/2 fluid overload - hs troponin low level and flat, 25>>24 - EKG w/ inferolateral TWIs, c/w prior EKGs  - suspect likely 2/2  demand ischemia from CHF - no plans for repeat cath   5. GI  - Abnormal Hepatic Enzymes and elevated Lipase (69) - ALT elevated at 108 - Alk Phos 147 - AST and bili normal  - may be 2/2 ETOH use  - CT of abdomen pending  - also ? Low output HF. Monitor. May need Bricelyn     Medication concerns reviewed with patient and pharmacy team. Barriers identified: poor health literacy, lack of reliable transportation. Will involve SW and refer to paramedicine.    Length of Stay: 0  Kerry Jester, PA-C  10/12/2019, 3:49 PM  Advanced Heart Failure Team Pager [phone] (Hamilton-F; 7a - 4p)  Please contact [facility] for night-coverage after hours (4p -7a ) and weekends on [email or web address]   Patient seen and examined with the above-signed Advanced Practice Provider and/or Housestaff. I personally reviewed  laboratory data, imaging studies and relevant notes. I independently examined the patient and formulated the important aspects of the plan. I have edited the note to reflect any of my changes or salient points. I have personally discussed the plan with the patient and/or family.  54 y/o male with schizophrenia, ETOH/tobacco abuse, DM2 and systolic HF due to NICM EF 15%. Now admitted with ADHF in setting of missing lasix x 1 month.  BNP 1,169 -> 2,015  CXR + mild edema Creatinine stable  General:  Sitting up in bed. No resp difficulty HEENT: normal Neck: supple. JVP to jaw . Carotids 2+ bilat; no bruits. No lymphadenopathy or thryomegaly appreciated. Cor: PMI nondisplaced. Regular rate & rhythm. + s3 + soft MR  Lungs: clear Abdomen: soft, nontender, nondistended. No hepatosplenomegaly. No bruits or masses. Good bowel sounds. Extremities: no cyanosis, clubbing, rash, tr-1+ edema + cool Neuro: alert & orientedx3, cranial nerves grossly intact. moves all 4 extremities w/o difficulty. Affect pleasant  Echo reviewed personally EF 15-20% severe RV dysfunction. Mod MR  Suspect he has severe ETOH CM and likely low output.   Agree with IV diuresis. Start spiro and digoxin. May need RHC prior to d/c.   Not candidate for advanced therapies.   Glori Bickers, MD  5:42 PM

## 2019-10-12 NOTE — ED Notes (Signed)
[phone] Kerry Hamilton

## 2019-10-12 NOTE — Progress Notes (Signed)
°  Echocardiogram 2D Echocardiogram has been performed.  Delcie Roch 10/12/2019, 5:00 PM

## 2019-10-12 NOTE — ED Notes (Signed)
Report given to rn on 3e 

## 2019-10-12 NOTE — Progress Notes (Signed)
  ReDS Clip Diuretic Study Pt study # [id number]  Your patient has been enrolled in the ReDS Clip Diuretic Study   Changes to prescribed diuretics recommended:  Continue furosemide 40 mg IV BID Provider contacted: Brittainy Simmons   REDS Clip  READING= 38%  CHEST RULER = 38 Clip Station = D   Orthodema score = 3 Signs/Symptoms Score   Mild edema, no orthopnea 0 No congestion  Moderate edema, no orthopnea 1 Low-grade orthodema/congestion  Severe edema OR orthopnea 2   Moderate edema and orthopnea 3 High-grade orthodema/congestion  Severe edema AND orthopnea 4    Sharen Hones, PharmD, BCPS Phone [phone] 10/12/2019       4:31 PM  Please check [email or web address] for unit-specific pharmacist phone numbers

## 2019-10-12 NOTE — ED Provider Notes (Addendum)
[facility] EMERGENCY DEPARTMENT Provider Note   CSN: [id number] Arrival date & time: 10/11/19  1629     History Chief Complaint  Patient presents with  . Chest Pain    Kerry Hamilton is a 54 y.o. male.  Kerry Hamilton is 54 yr old male with PMH of diabetes, CHF, polysubstance abuse, schizophrenia, bipolar disorder presents with 3-day history of exertional chest pain, dyspnea and dizziness.  Patient has been out of his Lasix for the past 2 months  He thinks he takes 40mg  Lasix daily but is unsure.  Patient has been breathless for the last few days especially on exertion.  Unable to walk half a block without dyspnea and also has orthopnea. Yesterday developed left-sided sharp, pleuritic chest pain which is 10/10 severity.  No radiation and intermittent in nature.  No relieving factors and exacerbated by exertion. Pt also endorses chronic cough producing yellow sputum. Denies fevers, chills, nausea, vomiting or diaphoresis.  Also endorses epigastric pain, burning sensation in his stomach and constipation.  Patient smokes half a pack of cigarettes a day and smoked for 30 years.  Uses crack and last did it last week.  Drinks 2 quarts of beer. His last drink was 2 days ago.  Denies history of pancreatitis or admissions for alcohol withdrawal.         Past Medical History:  Diagnosis Date  . Depression   . Diabetes mellitus without complication [facility])     Patient Active Problem List   Diagnosis Date Noted  . Schizoaffective disorder (HCC) 10/05/2018  . Acute CHF (congestive heart failure) (HCC) 08/01/2018  . Elevated liver enzymes 08/01/2018  . Acute on chronic systolic CHF (congestive heart failure) (HCC) 07/13/2018  . ARF (acute renal failure) (HCC) 07/13/2018  . Acute systolic CHF (congestive heart failure) (HCC)   . Elevated troponin   . Shortness of breath   . LFT elevation   . Prolonged QT interval   . Polysubstance abuse (HCC)   . ACS (acute coronary syndrome)  (HCC) 06/10/2018  . Diabetes mellitus type 2, noninsulin dependent (HCC) 06/10/2018  . Depression 06/10/2018    Past Surgical History:  Procedure Laterality Date  . RIGHT/LEFT HEART CATH AND CORONARY ANGIOGRAPHY N/A 06/13/2018   Procedure: RIGHT/LEFT HEART CATH AND CORONARY ANGIOGRAPHY;  Surgeon: 06/15/2018, Peter M, MD;  Location: [facility] INVASIVE CV LAB;  Service: Cardiovascular;  Laterality: N/A;       Family History  Problem Relation Age of Onset  . CAD Paternal Grandmother   . Diabetes Mellitus II Neg Hx     Social History   Tobacco Use  . Smoking status: Current Every Day Smoker    Packs/day: 1.00    Types: Cigarettes, Cigars  . Smokeless tobacco: Never Used  Vaping Use  . Vaping Use: Never used  Substance Use Topics  . Alcohol use: Yes    Alcohol/week: 6.0 standard drinks    Types: 6 Cans of beer per week    Comment: daily  . Drug use: Yes    Types: "Crack" cocaine    Home Medications Prior to Admission medications   Medication Sig Start Date End Date Taking? Authorizing Provider  carvedilol (COREG) 3.125 MG tablet Take 1 tablet (3.125 mg total) by mouth 2 (two) times daily with a meal. 07/03/19 10/01/19  12/01/19 M, PA-C  FLUoxetine (PROZAC) 20 MG capsule Take 1 capsule (20 mg total) by mouth daily. 10/10/18   10/12/18, NP  furosemide (LASIX) 40 MG tablet Take  1 tablet (40 mg total) by mouth daily. 07/03/19   Robbie Lis M, PA-C  losartan (COZAAR) 25 MG tablet Take 0.5 tablets (12.5 mg total) by mouth daily. 07/03/19 10/01/19  Robbie Lis M, PA-C  pantoprazole (PROTONIX) 40 MG tablet TAKE 1 TABLET(40 MG) BY MOUTH DAILY 07/05/19   Robbie Lis M, PA-C  QUEtiapine (SEROQUEL) 50 MG tablet Take 1 tablet (50 mg total) by mouth at bedtime. 10/09/18   Aldean Baker, NP  spironolactone (ALDACTONE) 25 MG tablet Take 0.5 tablets (12.5 mg total) by mouth daily. 07/03/19 10/01/19  Allayne Butcher, PA-C    Allergies    Patient has no known  allergies.  Review of Systems   Review of Systems  Respiratory: Positive for cough.   Cardiovascular: Positive for chest pain. Negative for palpitations and leg swelling.  Gastrointestinal: Positive for abdominal pain and constipation. Negative for blood in stool, diarrhea, nausea and vomiting.  Genitourinary: Negative for dysuria, frequency and hematuria.  Skin: Negative for pallor and rash.  Neurological: Positive for dizziness and light-headedness. Negative for headaches.    Physical Exam Updated Vital Signs BP (!) 138/91   Pulse (!) 59   Temp 98.2 F (36.8 C) (Oral)   Resp (!) 25   Ht 6' (1.829 m)   Wt 79.4 kg   SpO2 100%   BMI 23.73 kg/m   Physical Exam Constitutional:      General: He is not in acute distress.    Appearance: He is well-developed. He is not ill-appearing.  HENT:     Head: Normocephalic and atraumatic.  Cardiovascular:     Rate and Rhythm: Normal rate and regular rhythm.     Heart sounds: Normal heart sounds.  Pulmonary:     Effort: Tachypnea and respiratory distress present.     Breath sounds: Examination of the right-lower field reveals rales. Examination of the left-lower field reveals rales. Rales present.  Chest:     Chest wall: No deformity.  Skin:    General: Skin is warm and dry.  Neurological:     Mental Status: He is alert and oriented to person, place, and time.     ED Results / Procedures / Treatments   Labs (all labs ordered are listed, but only abnormal results are displayed) Labs Reviewed  BASIC METABOLIC PANEL - Abnormal; Notable for the following components:      Result Value   Glucose, Bld 135 (*)    BUN 27 (*)    Calcium 8.7 (*)    All other components within normal limits  CBC - Abnormal; Notable for the following components:   Hemoglobin 12.0 (*)    All other components within normal limits  BRAIN NATRIURETIC PEPTIDE - Abnormal; Notable for the following components:   B Natriuretic Peptide 2,015.6 (*)    All other  components within normal limits  TROPONIN I (HIGH SENSITIVITY) - Abnormal; Notable for the following components:   Troponin I (High Sensitivity) 25 (*)    All other components within normal limits  TROPONIN I (HIGH SENSITIVITY) - Abnormal; Notable for the following components:   Troponin I (High Sensitivity) 24 (*)    All other components within normal limits  SARS CORONAVIRUS 2 BY RT PCR (HOSPITAL ORDER, PERFORMED IN [facility] LAB)  LIPASE, BLOOD  PROTIME-INR  COMPREHENSIVE METABOLIC PANEL    EKG EKG Interpretation  Date/Time:  Thursday October 11 2019 16:36:44 EDT Ventricular Rate:  63 PR Interval:  166 QRS Duration: 154 QT Interval:  470 QTC Calculation: 480 R Axis:   113 Text Interpretation: Normal sinus rhythm Possible Left atrial enlargement Right bundle branch block , plus right ventricular hypertrophy T wave abnormality, consider inferolateral ischemia Abnormal ECG Confirmed by Tilden Fossa [phone]) on 10/12/2019 10:41:06 AM   Radiology DG Chest 2 View  Result Date: 10/11/2019 CLINICAL DATA:  Chest pain EXAM: CHEST - 2 VIEW COMPARISON:  Radiograph 04/17/2019 FINDINGS: Some mild interstitial opacity and fissural thickening with slight central vascular congestion. No consolidation, pneumothorax, or effusion. Cardiomegaly is similar to prior counting for differences in the PA versus AP technique on today's examination. No acute osseous or soft tissue abnormality. IMPRESSION: 1. Findings suggest mild interstitial edema. 2. Cardiomegaly, similar to prior. Electronically Signed   By: Kreg Shropshire M.D.   On: 10/11/2019 17:52    Procedures Procedures (including critical care time)  Medications Ordered in ED Medications  furosemide (LASIX) injection 40 mg (has no administration in time range)  pantoprazole (PROTONIX) injection 40 mg (has no administration in time range)    ED Course  I have reviewed the triage vital signs and the nursing notes.  Pertinent labs  & imaging results that were available during my care of the patient were reviewed by me and considered in my medical decision making (see chart for details).    MDM Rules/Calculators/A&P                          Kerry Hamilton is 54 yr old male with PMH of diabetes, CHF, polysubstance abuse, schizophrenia, bipolar disorder, medication noncompliance presents with 3-day history of exertional chest pain, dyspnea and dizziness. Labs on 9/16 showed CMP: Ca 8.7, Glucose 135, BNP 2015, troponin 25> 24, Hb 12, labs today: Glucose 125, BUN 24, Cr 0.93, Ca 8.4, ALP 127, ALT 108, AST 37, Lipase 69, total bili 0.2, Covid negative, chest x-ray: Bilateral facial edema, cardiomegaly.  On exam patient has bilateral crackles and tachypneic to 25.  No obvious peripheral edema.  Most likely differential is CHF exacerbation 2/2 medication noncompliance. Last echo in 2020 showed ejection fraction of less than 15%. Patient received 40 mg IV Lasix in the ED. Also considered PE however lower on differential. Low suspicion for CAP given no fevers and CXR changes.   Reviewed patient at 12:50 who reports improvement in dyspnea and chest pain after 40mg  IV Lasix. UOP .    Accepted by Dr at 13:50.   Final Clinical Impression(s) / ED Diagnoses Final diagnoses:  None    Rx / DC Orders ED Discharge Orders    None       Katrinka Blazing, MD 10/12/19 1355    10/14/19, MD 10/12/19 1358    10/14/19, MD 10/16/19 [phone]

## 2019-10-13 ENCOUNTER — Inpatient Hospital Stay (HOSPITAL_COMMUNITY): Payer: Medicaid Other

## 2019-10-13 DIAGNOSIS — I5023 Acute on chronic systolic (congestive) heart failure: Secondary | ICD-10-CM | POA: Diagnosis not present

## 2019-10-13 LAB — BASIC METABOLIC PANEL
Anion gap: 9 (ref 5–15)
BUN: 18 mg/dL (ref 6–20)
CO2: 24 mmol/L (ref 22–32)
Calcium: 8.4 mg/dL — ABNORMAL LOW (ref 8.9–10.3)
Chloride: 106 mmol/L (ref 98–111)
Creatinine, Ser: 1.13 mg/dL (ref 0.61–1.24)
GFR calc Af Amer: >60 mL/min (ref >60)
GFR calc non Af Amer: >60 mL/min (ref >60)
Glucose, Bld: 84 mg/dL (ref 70–99)
Potassium: 3.6 mmol/L (ref 3.5–5.1)
Sodium: 139 mmol/L (ref 135–145)

## 2019-10-13 LAB — CBC
HCT: 38.3 % — ABNORMAL LOW (ref 39.0–52.0)
Hemoglobin: 12.1 g/dL — ABNORMAL LOW (ref 13.0–17.0)
MCH: 27 pg (ref 26.0–34.0)
MCHC: 31.6 g/dL (ref 30.0–36.0)
MCV: 85.5 fL (ref 80.0–100.0)
Platelets: 208 10*3/uL (ref 150–400)
RBC: 4.48 MIL/uL (ref 4.22–5.81)
RDW: 13.4 % (ref 11.5–15.5)
WBC: 4.2 10*3/uL (ref 4.0–10.5)
nRBC: 0 % (ref 0.0–0.2)

## 2019-10-13 LAB — GLUCOSE, CAPILLARY
Glucose-Capillary: 97 mg/dL (ref 70–99)
Glucose-Capillary: 158 mg/dL — ABNORMAL HIGH (ref 70–99)
Glucose-Capillary: 173 mg/dL — ABNORMAL HIGH (ref 70–99)
Glucose-Capillary: 236 mg/dL — ABNORMAL HIGH (ref 70–99)

## 2019-10-13 LAB — PHOSPHORUS: Phosphorus: 3.6 mg/dL (ref 2.5–4.6)

## 2019-10-13 LAB — MAGNESIUM: Magnesium: 1.5 mg/dL — ABNORMAL LOW (ref 1.7–2.4)

## 2019-10-13 MED ORDER — MAGNESIUM SULFATE 4 GM/100ML IV SOLN
4.0000 g | Freq: Once | INTRAVENOUS | Status: AC
  Administered 2019-10-13: 4 g via INTRAVENOUS
  Filled 2019-10-13: qty 100

## 2019-10-13 MED ORDER — POTASSIUM CHLORIDE CRYS ER 20 MEQ PO TBCR
40.0000 meq | EXTENDED_RELEASE_TABLET | Freq: Once | ORAL | Status: AC
  Administered 2019-10-13: 40 meq via ORAL
  Filled 2019-10-13: qty 2

## 2019-10-13 MED ORDER — FUROSEMIDE 40 MG PO TABS
40.0000 mg | ORAL_TABLET | Freq: Every day | ORAL | Status: DC
  Administered 2019-10-15 – 2019-11-02 (×19): 40 mg via ORAL
  Filled 2019-10-13 (×21): qty 1

## 2019-10-13 MED ORDER — LOSARTAN POTASSIUM 25 MG PO TABS
25.0000 mg | ORAL_TABLET | Freq: Every day | ORAL | Status: DC
  Administered 2019-10-13 – 2019-11-02 (×20): 25 mg via ORAL
  Filled 2019-10-13 (×22): qty 1

## 2019-10-13 MED ORDER — FUROSEMIDE 10 MG/ML IJ SOLN
40.0000 mg | Freq: Two times a day (BID) | INTRAMUSCULAR | Status: AC
  Administered 2019-10-13: 40 mg via INTRAVENOUS

## 2019-10-13 NOTE — Plan of Care (Signed)
  Problem: Health Behavior/Discharge Planning: Goal: Ability to manage health-related needs will improve Outcome: Progressing   Problem: Clinical Measurements: Goal: Ability to maintain clinical measurements within normal limits will improve Outcome: Progressing   Problem: Clinical Measurements: Goal: Will remain free from infection Outcome: Progressing   

## 2019-10-13 NOTE — Progress Notes (Signed)
Advanced Heart Failure Rounding Note   Subjective:    Responding well to IV diuresis. Weight down 5 pounds  Denies dyspnea, orthopnea or PND. CP resolved.   Creatinine improved.    Objective:   Weight Range:  Vital Signs:   Temp:  [97.6 F (36.4 C)-98.4 F (36.9 C)] 98 F (36.7 C) (09/18 0523) Pulse Rate:  [56-70] 59 (09/18 0523) Resp:  [15-25] 16 (09/18 0523) BP: (126-142)/(73-100) 128/92 (09/18 0523) SpO2:  [100 %] 100 % (09/18 0523) Weight:  [77.8 kg-80.2 kg] 77.8 kg (09/18 0526) Last BM Date: 10/11/19  Weight change: Filed Weights   10/12/19 0319 10/12/19 1814 10/13/19 0526  Weight: 79.4 kg 80.2 kg 77.8 kg    Intake/Output:   Intake/Output Summary (Last 24 hours) at 10/13/2019 0724 Last data filed at 10/13/2019 0500 Gross per 24 hour  Intake 360 ml  Output 3900 ml  Net -3540 ml     Physical Exam: General:  Well appearing. No resp difficulty HEENT: normal Neck: supple. JVP 5-6. Carotids 2+ bilat; no bruits. No lymphadenopathy or thryomegaly appreciated. Cor: PMI nondisplaced. Regular rate & rhythm. +s3 Lungs: clear Abdomen: soft, nontender, nondistended. No hepatosplenomegaly. No bruits or masses. Good bowel sounds. Extremities: no cyanosis, clubbing, rash, edema Neuro: alert & orientedx3, cranial nerves grossly intact. moves all 4 extremities w/o difficulty. Affect pleasant  Telemetry: Sinus 50-60s Personally reviewed   Labs: Basic Metabolic Panel: Recent Labs  Lab 10/11/19 1645 10/12/19 1130 10/13/19 0600  NA 141 137 139  K 4.4 4.3 3.6  CL 110 107 106  CO2 24 20* 24  GLUCOSE 135* 125* 84  BUN 27* 24* 18  CREATININE 1.23 0.93 1.13  CALCIUM 8.7* 8.4* 8.4*  MG  --   --  1.5*  PHOS  --   --  3.6    Liver Function Tests: Recent Labs  Lab 10/12/19 1130  AST 37  ALT 108*  ALKPHOS 147*  BILITOT 0.6  PROT 6.3*  ALBUMIN 3.3*   Recent Labs  Lab 10/12/19 1130  LIPASE 69*   No results for input(s): AMMONIA in the last 168  hours.  CBC: Recent Labs  Lab 10/11/19 1645 10/13/19 0600  WBC 4.6 4.2  HGB 12.0* 12.1*  HCT 39.5 38.3*  MCV 89.4 85.5  PLT 210 208    Cardiac Enzymes: No results for input(s): CKTOTAL, CKMB, CKMBINDEX, TROPONINI in the last 168 hours.  BNP: BNP (last 3 results) Recent Labs    11/15/18 1418 04/17/19 1758 10/11/19 1645  BNP 292.9* 1,169.4* 2,015.6*    ProBNP (last 3 results) No results for input(s): PROBNP in the last 8760 hours.    Other results:  Imaging: DG Chest 2 View  Result Date: 10/11/2019 CLINICAL DATA:  Chest pain EXAM: CHEST - 2 VIEW COMPARISON:  Radiograph 04/17/2019 FINDINGS: Some mild interstitial opacity and fissural thickening with slight central vascular congestion. No consolidation, pneumothorax, or effusion. Cardiomegaly is similar to prior counting for differences in the PA versus AP technique on today's examination. No acute osseous or soft tissue abnormality. IMPRESSION: 1. Findings suggest mild interstitial edema. 2. Cardiomegaly, similar to prior. Electronically Signed   By: Kreg Shropshire M.D.   On: 10/11/2019 17:52   US Abdomen Complete  Result Date: 10/13/2019 CLINICAL DATA:  Elevated lipase EXAM: ABDOMEN ULTRASOUND COMPLETE COMPARISON:  08/01/2018 FINDINGS: Gallbladder: No gallstones or wall thickening visualized. No sonographic Murphy sign noted by sonographer. Common bile duct: Diameter: 4 mm Liver: No focal lesion identified. Within normal limits  in parenchymal echogenicity. Portal vein is patent on color Doppler imaging with normal direction of blood flow towards the liver. IVC: No abnormality visualized. Pancreas: Echogenic focus within the ventral aspect of the pancreatic head measures 0.5 x 0.3 x 0.7 cm this may reflect pancreatic calcification. The remainder of the pancreas is unremarkable. Spleen: Size and appearance within normal limits. Right Kidney: Length: 11.2 cm. Echogenicity is within normal limits. There is a simple 1.2 cm cyst within  the ventral midportion. No other renal abnormalities. Left Kidney: Length: 9.9 cm. Echogenicity within normal limits. No mass or hydronephrosis visualized. Abdominal aorta: No aneurysm visualized. Other findings: Trace right pleural effusion is incidentally noted. IMPRESSION: 1. Subcentimeter echogenic focus within the pancreatic head, favor calcification. 2. Trace right pleural effusion. 3. Simple right renal cyst. Electronically Signed   By: Sharlet Salina M.D.   On: 10/13/2019 01:36   ECHOCARDIOGRAM COMPLETE  Result Date: 10/12/2019    ECHOCARDIOGRAM REPORT   Patient Name:   Kerry Hamilton Date of Exam: 10/12/2019 Medical Rec #:  [id number]  Height:       72.0 in Accession #:    [id number] Weight:       175.0 lb Date of Birth:  [birth year]  BSA:          2.013 m Patient Age:    54 years   BP:           132/85 mmHg Patient Gender: M          HR:           68 bpm. Exam Location:  Inpatient Procedure: 2D Echo Indications:    chest pain 786.50  History:        Patient has prior history of Echocardiogram examinations, most                 recent 06/11/2018. CHF; Risk Factors:Diabetes and polysubstance                 abuse.  Sonographer:    Delcie Roch Referring Phys: [id number] RONDELL A SMITH IMPRESSIONS  1. Left ventricular ejection fraction, by estimation, is 15-20%. The left ventricle has severely decreased function. The left ventricle demonstrates global hypokinesis. The left ventricular internal cavity size was severely dilated. Left ventricular diastolic parameters are consistent with Grade III diastolic dysfunction (restrictive). Elevated left atrial pressure.  2. Right ventricular systolic function is severely reduced. The right ventricular size is severely enlarged. There is moderately elevated pulmonary artery systolic pressure. The estimated right ventricular systolic pressure is 54.8 mmHg.  3. Left atrial size was severely dilated.  4. Right atrial size was moderately dilated.  5. Severe mitral  regurgitation. 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 The mitral valve is abnormal. Moderate to severe mitral valve regurgitation.  6. Tricuspid valve regurgitation is moderate to severe.  7. The aortic valve is tricuspid. Aortic valve regurgitation is not visualized. No aortic stenosis is present.  8. The inferior vena cava is normal in size with <50% respiratory variability, suggesting right atrial pressure of 8 mmHg. Comparison(s): No significant change from prior study. FINDINGS  Left Ventricle: Left ventricular ejection fraction, by estimation, is 15-20%. The left ventricle has severely decreased function. The left ventricle demonstrates global hypokinesis. The left ventricular internal cavity size was severely dilated. There is no left ventricular hypertrophy. Left ventricular diastolic parameters are consistent with Grade III diastolic dysfunction (restrictive). Elevated left atrial pressure. Right Ventricle: The right ventricular size is severely enlarged. No increase  in right ventricular wall thickness. Right ventricular systolic function is severely reduced. There is moderately elevated pulmonary artery systolic pressure. The tricuspid regurgitant velocity is 3.42 m/s, and with an assumed right atrial pressure of 8 mmHg, the estimated right ventricular systolic pressure is 54.8 mmHg. Left Atrium: Left atrial size was severely dilated. Right Atrium: Right atrial size was moderately dilated. Pericardium: Trivial pericardial effusion is present. Mitral Valve: Severe mitral regurgitation. 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 The mitral valve is abnormal. Moderate to severe mitral valve regurgitation. Tricuspid Valve: The tricuspid valve is grossly normal. Tricuspid valve regurgitation is moderate to severe. No evidence of tricuspid stenosis. Aortic Valve: The aortic valve is tricuspid. Aortic valve regurgitation is not visualized. No aortic stenosis is  present. Pulmonic Valve: The pulmonic valve was grossly normal. Pulmonic valve regurgitation is trivial. No evidence of pulmonic stenosis. Aorta: The aortic root and ascending aorta are structurally normal, with no evidence of dilitation. Venous: The inferior vena cava is normal in size with less than 50% respiratory variability, suggesting right atrial pressure of 8 mmHg. IAS/Shunts: The atrial septum is grossly normal.  LEFT VENTRICLE PLAX 2D LVIDd:         7.10 cm LVIDs:         6.50 cm LV PW:         1.20 cm LV IVS:        0.90 cm LVOT diam:     2.30 cm LV SV:         25 LV SV Index:   12 LVOT Area:     4.15 cm  LV Volumes (MOD) LV vol d, MOD A4C: 225.0 ml LV vol s, MOD A4C: 185.0 ml LV SV MOD A4C:     225.0 ml RIGHT VENTRICLE            IVC RV S prime:     6.98 cm/s  IVC diam: 2.00 cm TAPSE (M-mode): 1.2 cm LEFT ATRIUM              Index       RIGHT ATRIUM           Index LA diam:        4.80 cm  2.38 cm/m  RA Area:     17.40 cm LA Vol (A2C):   130.0 ml 64.58 ml/m RA Volume:   51.10 ml  25.38 ml/m LA Vol (A4C):   96.0 ml  47.69 ml/m LA Biplane Vol: 115.0 ml 57.13 ml/m  AORTIC VALVE LVOT Vmax:   41.10 cm/s LVOT Vmean:  26.600 cm/s LVOT VTI:    0.060 m  AORTA Ao Root diam: 3.00 cm Ao Asc diam:  3.00 cm MITRAL VALVE               TRICUSPID VALVE MV Area (PHT): 4.39 cm    TR Peak grad:   46.8 mmHg MV Decel Time: 173 msec    TR Vmax:        342.00 cm/s MR Peak grad: 104.0 mmHg MR Mean grad: 66.0 mmHg    SHUNTS MR Vmax:      510.00 cm/s  Systemic VTI:  0.06 m MR Vmean:     380.0 cm/s   Systemic Diam: 2.30 cm MV E velocity: 81.00 cm/s MV A velocity: 24.00 cm/s MV E/A ratio:  3.38 Lennie Odor MD Electronically signed by Lennie Odor MD Signature Date/Time: 10/12/2019/5:36:12 PM    Final       Medications:  Scheduled Medications:  aspirin  81 mg Oral Daily   digoxin  0.125 mg Oral Daily   enoxaparin (LOVENOX) injection  40 mg Subcutaneous Q24H   folic acid  1 mg Oral Daily   furosemide  40  mg Intravenous BID   insulin aspart  0-5 Units Subcutaneous QHS   insulin aspart  0-9 Units Subcutaneous TID WC   multivitamin with minerals  1 tablet Oral Daily   sodium chloride flush  3 mL Intravenous Q12H   spironolactone  12.5 mg Oral Daily   thiamine  100 mg Oral Daily   Or   thiamine  100 mg Intravenous Daily     Infusions:  sodium chloride       PRN Medications:  sodium chloride, acetaminophen, LORazepam **OR** LORazepam, nicotine polacrilex, sodium chloride flush   Assessment/Plan:   1. Acute on Chronic Systolic HF - NICM, suspect ETOH - echo 5/20 EF 15%, RV moderately reduce  - R/LHC 5/20 showed no CAD and preserved CO, CI 2.2  - now admitted w/ a/c CHF with concernfor low output, NYHA Class IIIB, in the setting of poor med complaince and ongoing polysubstance abuse  - Echo reviewed personally EF 15-20% severe RV dysfunction. Mod MR - Now on spiro and digoxin - Add back losartan - No b-blocker yet with possible low output - Responding well to IV diuresis. Now close to euvolemic. GIve IV lasix this am and then switch to po - Will need RHC prior to d/c on Monday then home - refer to paramedicine - not a candidate for advanced therapies given poor compliance and active substance use   2. DM2 - HgbA1c 8.1 in 9/20 - repeat Hgb A1c pending  - Consider SGLT2i  3. Polysubstance abuse.  - pt admits that he smokes half a pack cigarettes per day on average, drinks 3 to 4 "forties"per week on average, and last smoked "crack"approximately 3 days - reinforced need for cessation  4. Chest Pain w/ minimally elevated Hs Troponin  - LHC 05/2018 showed no CAD - no plans for repeat cath   5. Hypomag - supp   Length of Stay: 1   Arvilla Meres MD 10/13/2019, 7:24 AM  Advanced Heart Failure Team Pager [phone] (M-F; 7a - 4p)  Please contact [facility] for night-coverage after hours (4p -7a ) and weekends on [email or web address]

## 2019-10-13 NOTE — Progress Notes (Signed)
PROGRESS NOTE    Kerry Hamilton  MLJ:[id number]  DOB: October 07, 1965  DOA: 10/11/2019 PCP: Medicine, [facility]:   Hospital course:  54 year old man with EF 15%, DM 2 was admitted yesterday with shortness of breath and chest pain.  She had been drinking a lot and using crack for the last week.  Laboratory data was noted for decompensated heart failure and troponin around 25.  Patient was treated with Lasix IV and seen by the advanced heart failure team.   Subjective:  Patient feels much better this morning.  States that his shortness of breath is better and his chest pain is resolved.  Notes he has not tried to walk around much but he is no longer short of breath lying in bed or talking.   Objective: Vitals:   10/12/19 2251 10/13/19 0523 10/13/19 0526 10/13/19 1207  BP: 126/73 (!) 128/92  111/75  Pulse: (!) 57 (!) 59  (!) 56  Resp: 20 16  20   Temp: 98.4 F (36.9 C) 98 F (36.7 C)  98.4 F (36.9 C)  TempSrc: Oral Oral  Oral  SpO2: 100% 100%  100%  Weight:   77.8 kg   Height:        Intake/Output Summary (Last 24 hours) at 10/13/2019 1542 Last data filed at 10/13/2019 1234 Gross per 24 hour  Intake 1080 ml  Output 5295 ml  Net -4215 ml   Filed Weights   10/12/19 0319 10/12/19 1814 10/13/19 0526  Weight: 79.4 kg 80.2 kg 77.8 kg     Exam:  General: Thin man sitting up on side of bed in no acute distress Eyes: sclera anicteric, conjuctiva mild injection bilaterally CVS: S1-S2, regular  Respiratory: Lungs are clear.  I hear no rales. GI: NABS, soft, NT  LE: No edema.  Neuro:  grossly nonfocal.  Psych: patient is logical and coherent, judgement and insight extremely poor.  Assessment & Plan:   HFrEF Very much appreciate heart failure team involvement Lungs are clear to my exam Lasix switched to p.o. to start tomorrow morning 40 mg Continue spironolactone, digoxin and losartan No beta-blocker given low output, patient is also  using crack RHC per heart failure team  Hypomagnesemia Magnesium 1.5 this morning, will give 4 g of magnesium recheck in the morning  DM2 Blood sugars under reasonable control on SSI AC at bedtime Patient is noncompliant with medications  Polysubstance use Patient is in precontemplation regarding stopping  Minimally elevated troponin Possibly demand ischemia No CAD on LHC on 05/2018  ALT 106, lipase 70 Abdominal pain is resolved Abdominal ultrasound is unrevealing Can get work-up including hepatitis C panel as an outpatient as warranted   DVT prophylaxis: Lovenox Code Status: Full Family Communication: None Disposition Plan:   Patient is from: Home  Anticipated Discharge Location: Home  Barriers to Discharge: Awaiting right heart cath per cardiology  Is patient medically stable for Discharge: Per cardiology   Consultants:  Heart failure team  Procedures:  None yet  Antimicrobials:  None   Data Reviewed:  Basic Metabolic Panel: Recent Labs  Lab 10/11/19 1645 10/12/19 1130 10/13/19 0600  NA 141 137 139  K 4.4 4.3 3.6  CL 110 107 106  CO2 24 20* 24  GLUCOSE 135* 125* 84  BUN 27* 24* 18  CREATININE 1.23 0.93 1.13  CALCIUM 8.7* 8.4* 8.4*  MG  --   --  1.5*  PHOS  --   --  3.6   Liver Function Tests:  Recent Labs  Lab 10/12/19 1130  AST 37  ALT 108*  ALKPHOS 147*  BILITOT 0.6  PROT 6.3*  ALBUMIN 3.3*   Recent Labs  Lab 10/12/19 1130  LIPASE 69*   No results for input(s): AMMONIA in the last 168 hours. CBC: Recent Labs  Lab 10/11/19 1645 10/13/19 0600  WBC 4.6 4.2  HGB 12.0* 12.1*  HCT 39.5 38.3*  MCV 89.4 85.5  PLT 210 208   Cardiac Enzymes: No results for input(s): CKTOTAL, CKMB, CKMBINDEX, TROPONINI in the last 168 hours. BNP (last 3 results) No results for input(s): PROBNP in the last 8760 hours. CBG: Recent Labs  Lab 10/12/19 1828 10/12/19 2205 10/13/19 0701 10/13/19 1205  GLUCAP 218* 205* 97 158*    Recent Results  (from the past 240 hour(s))  SARS Coronavirus 2 by RT PCR (hospital order, performed in [facility] hospital lab) Nasopharyngeal Nasopharyngeal Swab     Status: None   Collection Time: 10/11/19  4:45 PM   Specimen: Nasopharyngeal Swab  Result Value Ref Range Status   SARS Coronavirus 2 NEGATIVE NEGATIVE Final    Comment: (NOTE) SARS-CoV-2 target nucleic acids are NOT DETECTED.  The SARS-CoV-2 RNA is generally detectable in upper and lower respiratory specimens during the acute phase of infection. The lowest concentration of SARS-CoV-2 viral copies this assay can detect is 250 copies / mL. A negative result does not preclude SARS-CoV-2 infection and should not be used as the sole basis for treatment or other patient management decisions.  A negative result may occur with improper specimen collection / handling, submission of specimen other than nasopharyngeal swab, presence of viral mutation(s) within the areas targeted by this assay, and inadequate number of viral copies (<250 copies / mL). A negative result must be combined with clinical observations, patient history, and epidemiological information.  Fact Sheet for Patients:   BoilerBrush.com.cy  Fact Sheet for Healthcare Providers: https://pope.com/  This test is not yet approved or  cleared by the [place] FDA and has been authorized for detection and/or diagnosis of SARS-CoV-2 by FDA under an Emergency Use Authorization (EUA).  This EUA will remain in effect (meaning this test can be used) for the duration of the COVID-19 declaration under Section 564(b)(1) of the Act, 21 U.S.C. section 360bbb-3(b)(1), unless the authorization is terminated or revoked sooner.  Performed at [facility], [address], [place], [place] [address]       Studies: DG Chest 2 View  Result Date: 10/11/2019 CLINICAL DATA:  Chest pain EXAM: CHEST - 2 VIEW COMPARISON:  Radiograph  04/17/2019 FINDINGS: Some mild interstitial opacity and fissural thickening with slight central vascular congestion. No consolidation, pneumothorax, or effusion. Cardiomegaly is similar to prior counting for differences in the PA versus AP technique on today's examination. No acute osseous or soft tissue abnormality. IMPRESSION: 1. Findings suggest mild interstitial edema. 2. Cardiomegaly, similar to prior. Electronically Signed   By: Kreg Shropshire M.D.   On: 10/11/2019 17:52   US Abdomen Complete  Result Date: 10/13/2019 CLINICAL DATA:  Elevated lipase EXAM: ABDOMEN ULTRASOUND COMPLETE COMPARISON:  08/01/2018 FINDINGS: Gallbladder: No gallstones or wall thickening visualized. No sonographic Murphy sign noted by sonographer. Common bile duct: Diameter: 4 mm Liver: No focal lesion identified. Within normal limits in parenchymal echogenicity. Portal vein is patent on color Doppler imaging with normal direction of blood flow towards the liver. IVC: No abnormality visualized. Pancreas: Echogenic focus within the ventral aspect of the pancreatic head measures 0.5 x 0.3  x 0.7 cm this may reflect pancreatic calcification. The remainder of the pancreas is unremarkable. Spleen: Size and appearance within normal limits. Right Kidney: Length: 11.2 cm. Echogenicity is within normal limits. There is a simple 1.2 cm cyst within the ventral midportion. No other renal abnormalities. Left Kidney: Length: 9.9 cm. Echogenicity within normal limits. No mass or hydronephrosis visualized. Abdominal aorta: No aneurysm visualized. Other findings: Trace right pleural effusion is incidentally noted. IMPRESSION: 1. Subcentimeter echogenic focus within the pancreatic head, favor calcification. 2. Trace right pleural effusion. 3. Simple right renal cyst. Electronically Signed   By: Sharlet Salina M.D.   On: 10/13/2019 01:36   ECHOCARDIOGRAM COMPLETE  Result Date: 10/12/2019    ECHOCARDIOGRAM REPORT   Patient Name:   ASAF ELMQUIST Date of  Exam: 10/12/2019 Medical Rec #:  [id number]  Height:       72.0 in Accession #:    [id number] Weight:       175.0 lb Date of Birth:  29-Aug-1965  BSA:          2.013 m Patient Age:    54 years   BP:           132/85 mmHg Patient Gender: M          HR:           68 bpm. Exam Location:  Inpatient Procedure: 2D Echo Indications:    chest pain 786.50  History:        Patient has prior history of Echocardiogram examinations, most                 recent 06/11/2018. CHF; Risk Factors:Diabetes and polysubstance                 abuse.  Sonographer:    Delcie Roch Referring Phys: [id number] RONDELL A SMITH IMPRESSIONS  1. Left ventricular ejection fraction, by estimation, is 15-20%. The left ventricle has severely decreased function. The left ventricle demonstrates global hypokinesis. The left ventricular internal cavity size was severely dilated. Left ventricular diastolic parameters are consistent with Grade III diastolic dysfunction (restrictive). Elevated left atrial pressure.  2. Right ventricular systolic function is severely reduced. The right ventricular size is severely enlarged. There is moderately elevated pulmonary artery systolic pressure. The estimated right ventricular systolic pressure is 54.8 mmHg.  3. Left atrial size was severely dilated.  4. Right atrial size was moderately dilated.  5. Severe mitral regurgitation. 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 The mitral valve is abnormal. Moderate to severe mitral valve regurgitation.  6. Tricuspid valve regurgitation is moderate to severe.  7. The aortic valve is tricuspid. Aortic valve regurgitation is not visualized. No aortic stenosis is present.  8. The inferior vena cava is normal in size with <50% respiratory variability, suggesting right atrial pressure of 8 mmHg. Comparison(s): No significant change from prior study. FINDINGS  Left Ventricle: Left ventricular ejection fraction, by estimation, is 15-20%. The left ventricle has  severely decreased function. The left ventricle demonstrates global hypokinesis. The left ventricular internal cavity size was severely dilated. There is no left ventricular hypertrophy. Left ventricular diastolic parameters are consistent with Grade III diastolic dysfunction (restrictive). Elevated left atrial pressure. Right Ventricle: The right ventricular size is severely enlarged. No increase in right ventricular wall thickness. Right ventricular systolic function is severely reduced. There is moderately elevated pulmonary artery systolic pressure. The tricuspid regurgitant velocity is 3.42 m/s, and with an assumed right atrial pressure of 8 mmHg, the estimated  right ventricular systolic pressure is 54.8 mmHg. Left Atrium: Left atrial size was severely dilated. Right Atrium: Right atrial size was moderately dilated. Pericardium: Trivial pericardial effusion is present. Mitral Valve: Severe mitral regurgitation. 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 The mitral valve is abnormal. Moderate to severe mitral valve regurgitation. Tricuspid Valve: The tricuspid valve is grossly normal. Tricuspid valve regurgitation is moderate to severe. No evidence of tricuspid stenosis. Aortic Valve: The aortic valve is tricuspid. Aortic valve regurgitation is not visualized. No aortic stenosis is present. Pulmonic Valve: The pulmonic valve was grossly normal. Pulmonic valve regurgitation is trivial. No evidence of pulmonic stenosis. Aorta: The aortic root and ascending aorta are structurally normal, with no evidence of dilitation. Venous: The inferior vena cava is normal in size with less than 50% respiratory variability, suggesting right atrial pressure of 8 mmHg. IAS/Shunts: The atrial septum is grossly normal.  LEFT VENTRICLE PLAX 2D LVIDd:         7.10 cm LVIDs:         6.50 cm LV PW:         1.20 cm LV IVS:        0.90 cm LVOT diam:     2.30 cm LV SV:         25 LV SV Index:   12 LVOT Area:     4.15  cm  LV Volumes (MOD) LV vol d, MOD A4C: 225.0 ml LV vol s, MOD A4C: 185.0 ml LV SV MOD A4C:     225.0 ml RIGHT VENTRICLE            IVC RV S prime:     6.98 cm/s  IVC diam: 2.00 cm TAPSE (M-mode): 1.2 cm LEFT ATRIUM              Index       RIGHT ATRIUM           Index LA diam:        4.80 cm  2.38 cm/m  RA Area:     17.40 cm LA Vol (A2C):   130.0 ml 64.58 ml/m RA Volume:   51.10 ml  25.38 ml/m LA Vol (A4C):   96.0 ml  47.69 ml/m LA Biplane Vol: 115.0 ml 57.13 ml/m  AORTIC VALVE LVOT Vmax:   41.10 cm/s LVOT Vmean:  26.600 cm/s LVOT VTI:    0.060 m  AORTA Ao Root diam: 3.00 cm Ao Asc diam:  3.00 cm MITRAL VALVE               TRICUSPID VALVE MV Area (PHT): 4.39 cm    TR Peak grad:   46.8 mmHg MV Decel Time: 173 msec    TR Vmax:        342.00 cm/s MR Peak grad: 104.0 mmHg MR Mean grad: 66.0 mmHg    SHUNTS MR Vmax:      510.00 cm/s  Systemic VTI:  0.06 m MR Vmean:     380.0 cm/s   Systemic Diam: 2.30 cm MV E velocity: 81.00 cm/s MV A velocity: 24.00 cm/s MV E/A ratio:  3.38 Lennie Odor MD Electronically signed by Lennie Odor MD Signature Date/Time: 10/12/2019/5:36:12 PM    Final      Scheduled Meds: . aspirin  81 mg Oral Daily  . digoxin  0.125 mg Oral Daily  . enoxaparin (LOVENOX) injection  40 mg Subcutaneous Q24H  . folic acid  1 mg Oral Daily  . insulin aspart  0-5 Units  Subcutaneous QHS  . insulin aspart  0-9 Units Subcutaneous TID WC  . losartan  25 mg Oral Daily  . multivitamin with minerals  1 tablet Oral Daily  . sodium chloride flush  3 mL Intravenous Q12H  . spironolactone  12.5 mg Oral Daily  . thiamine  100 mg Oral Daily   Or  . thiamine  100 mg Intravenous Daily   Continuous Infusions: . sodium chloride      Principal Problem:   Acute on chronic systolic CHF (congestive heart failure) (HCC) Active Problems:   Diabetes mellitus type 2, noninsulin dependent (HCC)   Elevated troponin   Polysubstance abuse (HCC)     Macarius Ruark Tublu Cadden Elizondo, [facility]  If  7PM-7AM, please contact night-coverage [email or web address] Password [email or web address] 10/13/2019, 3:42 PM    LOS: 1 day

## 2019-10-13 NOTE — Progress Notes (Addendum)
  ReDS Clip Diuretic Study Pt study # [id number]  Your patient has been enrolled in the ReDS Clip Diuretic Study  Uop -4.8 L/24 hr on lasix IV 40 BID - Scr up slightly from 0.93 to 1.13. Wt down from 175 to 171 kg. Pt reporting shortness of breath improving.   Changes to prescribed diuretics recommended:  Continue furosemide 40 mg IV once more this morning then transition to PO tomorrow. Provider contacted: Dr Gala Romney    REDS Clip  READING= 34%   CHEST RULER = 38 Clip Station = D   Orthodema score = 1 Signs/Symptoms Score   Mild edema, no orthopnea 0 No congestion  Moderate edema, no orthopnea 1 Low-grade orthodema/congestion  Severe edema OR orthopnea 2   Moderate edema and orthopnea 3 High-grade orthodema/congestion  Severe edema AND orthopnea 4    Sherron Monday, PharmD, BCCCP Clinical Pharmacist  Phone: [phone] 10/13/2019 8:28 AM  Please check AMION for all [facility] Pharm[facility] phone numbers After 10:00 PM, call Main Pharmacy [phone]

## 2019-10-14 DIAGNOSIS — I5023 Acute on chronic systolic (congestive) heart failure: Secondary | ICD-10-CM | POA: Diagnosis not present

## 2019-10-14 LAB — GLUCOSE, CAPILLARY
Glucose-Capillary: 114 mg/dL — ABNORMAL HIGH (ref 70–99)
Glucose-Capillary: 155 mg/dL — ABNORMAL HIGH (ref 70–99)
Glucose-Capillary: 120 mg/dL — ABNORMAL HIGH (ref 70–99)
Glucose-Capillary: 101 mg/dL — ABNORMAL HIGH (ref 70–99)
Glucose-Capillary: 108 mg/dL — ABNORMAL HIGH (ref 70–99)

## 2019-10-14 LAB — CBC
HCT: 43.6 % (ref 39.0–52.0)
Hemoglobin: 13.5 g/dL (ref 13.0–17.0)
MCH: 27.1 pg (ref 26.0–34.0)
MCHC: 31 g/dL (ref 30.0–36.0)
MCV: 87.4 fL (ref 80.0–100.0)
Platelets: 244 10*3/uL (ref 150–400)
RBC: 4.99 MIL/uL (ref 4.22–5.81)
RDW: 13.2 % (ref 11.5–15.5)
WBC: 4.1 10*3/uL (ref 4.0–10.5)
nRBC: 0 % (ref 0.0–0.2)

## 2019-10-14 LAB — MAGNESIUM: Magnesium: 2.2 mg/dL (ref 1.7–2.4)

## 2019-10-14 LAB — BASIC METABOLIC PANEL
Anion gap: 7 (ref 5–15)
BUN: 16 mg/dL (ref 6–20)
CO2: 23 mmol/L (ref 22–32)
Calcium: 8.7 mg/dL — ABNORMAL LOW (ref 8.9–10.3)
Chloride: 106 mmol/L (ref 98–111)
Creatinine, Ser: 1.13 mg/dL (ref 0.61–1.24)
GFR calc Af Amer: >60 mL/min (ref >60)
GFR calc non Af Amer: >60 mL/min (ref >60)
Glucose, Bld: 102 mg/dL — ABNORMAL HIGH (ref 70–99)
Potassium: 4.4 mmol/L (ref 3.5–5.1)
Sodium: 136 mmol/L (ref 135–145)

## 2019-10-14 MED ORDER — SPIRONOLACTONE 25 MG PO TABS
25.0000 mg | ORAL_TABLET | Freq: Every day | ORAL | Status: DC
  Administered 2019-10-15 – 2019-11-02 (×19): 25 mg via ORAL
  Filled 2019-10-14 (×20): qty 1

## 2019-10-14 NOTE — Progress Notes (Signed)
Advanced Heart Failure Rounding Note   Subjective:    Lasix switched to po yesterday.   Agitated this am. Refusing meds and lab draws. Priamry team treating for withdrawal. Now sedated   Objective:   Weight Range:  Vital Signs:   Temp:  [97.7 F (36.5 C)-98.5 F (36.9 C)] 97.7 F (36.5 C) (09/19 0523) Pulse Rate:  [56-63] 63 (09/19 0900) Resp:  [18-20] 19 (09/19 0523) BP: (111-131)/(70-86) 131/81 (09/19 0900) SpO2:  [99 %-100 %] 100 % (09/19 0900) Weight:  [4.536 kg-76.4 kg] 76.4 kg (09/19 0900) Last BM Date: 10/12/19  Weight change: Filed Weights   10/13/19 0526 10/14/19 0500 10/14/19 0900  Weight: 77.8 kg 4.536 kg 76.4 kg    Intake/Output:   Intake/Output Summary (Last 24 hours) at 10/14/2019 1006 Last data filed at 10/14/2019 0824 Gross per 24 hour  Intake 720 ml  Output 4095 ml  Net -3375 ml     Physical Exam: General:  Sedated. No resp difficulty HEENT: normal Neck: supple. no JVD. Carotids 2+ bilat; no bruits. No lymphadenopathy or thryomegaly appreciated. Cor: PMI nondisplaced. Regular rate & rhythm. No rubs, gallops or murmurs. Lungs: clear Abdomen: soft, nontender, nondistended. No hepatosplenomegaly. No bruits or masses. Good bowel sounds. Extremities: no cyanosis, clubbing, rash, edema Neuro: alert & orientedx3, cranial nerves grossly intact. moves all 4 extremities w/o difficulty. Affect sedated   Telemetry: Sinus 50-60s Personally reviewed   Labs: Basic Metabolic Panel: Recent Labs  Lab 10/11/19 1645 10/12/19 1130 10/13/19 0600  NA 141 137 139  K 4.4 4.3 3.6  CL 110 107 106  CO2 24 20* 24  GLUCOSE 135* 125* 84  BUN 27* 24* 18  CREATININE 1.23 0.93 1.13  CALCIUM 8.7* 8.4* 8.4*  MG  --   --  1.5*  PHOS  --   --  3.6    Liver Function Tests: Recent Labs  Lab 10/12/19 1130  AST 37  ALT 108*  ALKPHOS 147*  BILITOT 0.6  PROT 6.3*  ALBUMIN 3.3*   Recent Labs  Lab 10/12/19 1130  LIPASE 69*   No results for input(s):  AMMONIA in the last 168 hours.  CBC: Recent Labs  Lab 10/11/19 1645 10/13/19 0600  WBC 4.6 4.2  HGB 12.0* 12.1*  HCT 39.5 38.3*  MCV 89.4 85.5  PLT 210 208    Cardiac Enzymes: No results for input(s): CKTOTAL, CKMB, CKMBINDEX, TROPONINI in the last 168 hours.  BNP: BNP (last 3 results) Recent Labs    11/15/18 1418 04/17/19 1758 10/11/19 1645  BNP 292.9* 1,169.4* 2,015.6*    ProBNP (last 3 results) No results for input(s): PROBNP in the last 8760 hours.    Other results:  Imaging: US Abdomen Complete  Result Date: 10/13/2019 CLINICAL DATA:  Elevated lipase EXAM: ABDOMEN ULTRASOUND COMPLETE COMPARISON:  08/01/2018 FINDINGS: Gallbladder: No gallstones or wall thickening visualized. No sonographic Murphy sign noted by sonographer. Common bile duct: Diameter: 4 mm Liver: No focal lesion identified. Within normal limits in parenchymal echogenicity. Portal vein is patent on color Doppler imaging with normal direction of blood flow towards the liver. IVC: No abnormality visualized. Pancreas: Echogenic focus within the ventral aspect of the pancreatic head measures 0.5 x 0.3 x 0.7 cm this may reflect pancreatic calcification. The remainder of the pancreas is unremarkable. Spleen: Size and appearance within normal limits. Right Kidney: Length: 11.2 cm. Echogenicity is within normal limits. There is a simple 1.2 cm cyst within the ventral midportion. No other renal abnormalities. Left  Kidney: Length: 9.9 cm. Echogenicity within normal limits. No mass or hydronephrosis visualized. Abdominal aorta: No aneurysm visualized. Other findings: Trace right pleural effusion is incidentally noted. IMPRESSION: 1. Subcentimeter echogenic focus within the pancreatic head, favor calcification. 2. Trace right pleural effusion. 3. Simple right renal cyst. Electronically Signed   By: Sharlet Salina M.D.   On: 10/13/2019 01:36   ECHOCARDIOGRAM COMPLETE  Result Date: 10/12/2019    ECHOCARDIOGRAM REPORT    Patient Name:   Kerry Hamilton Date of Exam: 10/12/2019 Medical Rec #:  [id number]  Height:       72.0 in Accession #:    [id number] Weight:       175.0 lb Date of Birth:  08-21-1965  BSA:          2.013 m Patient Age:    54 years   BP:           132/85 mmHg Patient Gender: M          HR:           68 bpm. Exam Location:  Inpatient Procedure: 2D Echo Indications:    chest pain 786.50  History:        Patient has prior history of Echocardiogram examinations, most                 recent 06/11/2018. CHF; Risk Factors:Diabetes and polysubstance                 abuse.  Sonographer:    Delcie Roch Referring Phys: [id number] RONDELL A SMITH IMPRESSIONS  1. Left ventricular ejection fraction, by estimation, is 15-20%. The left ventricle has severely decreased function. The left ventricle demonstrates global hypokinesis. The left ventricular internal cavity size was severely dilated. Left ventricular diastolic parameters are consistent with Grade III diastolic dysfunction (restrictive). Elevated left atrial pressure.  2. Right ventricular systolic function is severely reduced. The right ventricular size is severely enlarged. There is moderately elevated pulmonary artery systolic pressure. The estimated right ventricular systolic pressure is 54.8 mmHg.  3. Left atrial size was severely dilated.  4. Right atrial size was moderately dilated.  5. Severe mitral regurgitation. 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 The mitral valve is abnormal. Moderate to severe mitral valve regurgitation.  6. Tricuspid valve regurgitation is moderate to severe.  7. The aortic valve is tricuspid. Aortic valve regurgitation is not visualized. No aortic stenosis is present.  8. The inferior vena cava is normal in size with <50% respiratory variability, suggesting right atrial pressure of 8 mmHg. Comparison(s): No significant change from prior study. FINDINGS  Left Ventricle: Left ventricular ejection fraction, by estimation,  is 15-20%. The left ventricle has severely decreased function. The left ventricle demonstrates global hypokinesis. The left ventricular internal cavity size was severely dilated. There is no left ventricular hypertrophy. Left ventricular diastolic parameters are consistent with Grade III diastolic dysfunction (restrictive). Elevated left atrial pressure. Right Ventricle: The right ventricular size is severely enlarged. No increase in right ventricular wall thickness. Right ventricular systolic function is severely reduced. There is moderately elevated pulmonary artery systolic pressure. The tricuspid regurgitant velocity is 3.42 m/s, and with an assumed right atrial pressure of 8 mmHg, the estimated right ventricular systolic pressure is 54.8 mmHg. Left Atrium: Left atrial size was severely dilated. Right Atrium: Right atrial size was moderately dilated. Pericardium: Trivial pericardial effusion is present. Mitral Valve: Severe mitral regurgitation. 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 The mitral valve is abnormal.  Moderate to severe mitral valve regurgitation. Tricuspid Valve: The tricuspid valve is grossly normal. Tricuspid valve regurgitation is moderate to severe. No evidence of tricuspid stenosis. Aortic Valve: The aortic valve is tricuspid. Aortic valve regurgitation is not visualized. No aortic stenosis is present. Pulmonic Valve: The pulmonic valve was grossly normal. Pulmonic valve regurgitation is trivial. No evidence of pulmonic stenosis. Aorta: The aortic root and ascending aorta are structurally normal, with no evidence of dilitation. Venous: The inferior vena cava is normal in size with less than 50% respiratory variability, suggesting right atrial pressure of 8 mmHg. IAS/Shunts: The atrial septum is grossly normal.  LEFT VENTRICLE PLAX 2D LVIDd:         7.10 cm LVIDs:         6.50 cm LV PW:         1.20 cm LV IVS:        0.90 cm LVOT diam:     2.30 cm LV SV:         25 LV  SV Index:   12 LVOT Area:     4.15 cm  LV Volumes (MOD) LV vol d, MOD A4C: 225.0 ml LV vol s, MOD A4C: 185.0 ml LV SV MOD A4C:     225.0 ml RIGHT VENTRICLE            IVC RV S prime:     6.98 cm/s  IVC diam: 2.00 cm TAPSE (M-mode): 1.2 cm LEFT ATRIUM              Index       RIGHT ATRIUM           Index LA diam:        4.80 cm  2.38 cm/m  RA Area:     17.40 cm LA Vol (A2C):   130.0 ml 64.58 ml/m RA Volume:   51.10 ml  25.38 ml/m LA Vol (A4C):   96.0 ml  47.69 ml/m LA Biplane Vol: 115.0 ml 57.13 ml/m  AORTIC VALVE LVOT Vmax:   41.10 cm/s LVOT Vmean:  26.600 cm/s LVOT VTI:    0.060 m  AORTA Ao Root diam: 3.00 cm Ao Asc diam:  3.00 cm MITRAL VALVE               TRICUSPID VALVE MV Area (PHT): 4.39 cm    TR Peak grad:   46.8 mmHg MV Decel Time: 173 msec    TR Vmax:        342.00 cm/s MR Peak grad: 104.0 mmHg MR Mean grad: 66.0 mmHg    SHUNTS MR Vmax:      510.00 cm/s  Systemic VTI:  0.06 m MR Vmean:     380.0 cm/s   Systemic Diam: 2.30 cm MV E velocity: 81.00 cm/s MV A velocity: 24.00 cm/s MV E/A ratio:  3.38 Lennie Odor MD Electronically signed by Lennie Odor MD Signature Date/Time: 10/12/2019/5:36:12 PM    Final      Medications:     Scheduled Medications:  aspirin  81 mg Oral Daily   digoxin  0.125 mg Oral Daily   enoxaparin (LOVENOX) injection  40 mg Subcutaneous Q24H   folic acid  1 mg Oral Daily   furosemide  40 mg Oral Daily   insulin aspart  0-5 Units Subcutaneous QHS   insulin aspart  0-9 Units Subcutaneous TID WC   losartan  25 mg Oral Daily   multivitamin with minerals  1 tablet Oral Daily   sodium chloride flush  3 mL Intravenous Q12H   spironolactone  12.5 mg Oral Daily   thiamine  100 mg Oral Daily   Or   thiamine  100 mg Intravenous Daily    Infusions:  sodium chloride      PRN Medications: sodium chloride, acetaminophen, LORazepam **OR** LORazepam, nicotine polacrilex, sodium chloride flush   Assessment/Plan:   1. Acute on Chronic Systolic HF -  NICM, suspect ETOH - echo 5/20 EF 15%, RV moderately reduce  - R/LHC 5/20 showed no CAD and preserved CO, CI 2.2  - now admitted w/ a/c CHF with concernfor low output, NYHA Class IIIB, in the setting of poor med complaince and ongoing polysubstance abuse  - Echo reviewed personally EF 15-20% severe RV dysfunction. Mod MR - Now on spiro and digoxin - Losartan added back yesterday  - No b-blocker yet with possible low output - Responded well to IV diuresis. Now on po diuretics. Volume status looks good - Initially planned for RHC tomorrow but may need to defer pending on mental status  - refer to paramedicine - not a candidate for advanced therapies given poor compliance and active substance use   2. DM2 - HgbA1c 8.1 in 9/20 - repeat Hgb A1c pending  - Consider SGLT2i  3. Polysubstance abuse with possibe ETOH withdrawal - pt admits that he smokes half a pack cigarettes per day on average, drinks 3 to 4 "forties"per week on average, and last smoked "crack"approximately 3 days - reinforced need for cessation - primary team managing withdrawal symptoms   4. Chest Pain w/ minimally elevated Hs Troponin  - doubt ischemic - LHC 05/2018 showed no CAD  5. Hypomag - supped yesterday. Refused f/u labs this am   Length of Stay: 2   Arvilla Meres MD 10/14/2019, 10:06 AM  Advanced Heart Failure Team Pager [phone] (M-F; 7a - 4p)  Please contact [facility] for night-coverage after hours (4p -7a ) and weekends on [email or web address]

## 2019-10-14 NOTE — Progress Notes (Addendum)
  ReDS Clip Diuretic Study Pt study # [id number]  Your patient has been enrolled in the ReDS Clip Diuretic Study  Uop -3.5 L/24 hr on lasix IV 40 x1- labs today not yet collected. Wt refusing standing wt - bed wt 3 lbs from yesterday. Patient not willing to participate in reading today after multiple attempts.   Was on lasix 40 mg PO PTA however reported as not taking.   Changes to prescribed diuretics recommended:  Consider change to lasix PO.  Provider contacted: Dr Gala Romney    REDS Clip  READING=  Unable to obtain   CHEST RULER = 38 Clip Station = D   Orthodema score = 1 Signs/Symptoms Score   Mild edema, no orthopnea 0 No congestion  Moderate edema, no orthopnea 1 Low-grade orthodema/congestion  Severe edema OR orthopnea 2   Moderate edema and orthopnea 3 High-grade orthodema/congestion  Severe edema AND orthopnea 4    Sherron Monday, PharmD, BCCCP Clinical Pharmacist  Phone: [phone] 10/14/2019 9:10 AM  Please check AMION for all [facility] phone numbers After 10:00 PM, call Main Pharmacy [phone]

## 2019-10-14 NOTE — Progress Notes (Signed)
PROGRESS NOTE    Kerry Hamilton  HLK:[id number]  DOB: 1965/05/31  DOA: 10/11/2019 PCP: Medicine, [facility]:   Hospital course:  54 year old man with EF 15%, DM 2 was admitted yesterday with shortness of breath and chest pain.  She had been drinking a lot and using crack for the last week.  Laboratory data was noted for decompensated heart failure and troponin around 25.  Patient was treated with Lasix IV and seen by the advanced heart failure team.  On day 3 after admission patient was noted to be talking to himself and somewhat confused.  Patient had been on CIWA protocol and is thought to be in DTs.  Ativan has been effective in controlling his agitation.   Subjective:  Patient himself is sleeping when I walk in the room.  However report by nurse and pharmacist states that patient was agitated and picking at his clothes and talking to himself and perhaps having visual hallucinations.  Was able to be redirected and was improved with Ativan 2 mg per CIWA protocol.   Objective: Vitals:   10/14/19 0500 10/14/19 0523 10/14/19 0900 10/14/19 1151  BP:  113/73 131/81 113/87  Pulse:  61 63 62  Resp:  19  18  Temp:  97.7 F (36.5 C)  (!) 97.5 F (36.4 C)  TempSrc:    Oral  SpO2:  100% 100% 91%  Weight: 4.536 kg  76.4 kg   Height:        Intake/Output Summary (Last 24 hours) at 10/14/2019 1227 Last data filed at 10/14/2019 0824 Gross per 24 hour  Intake 720 ml  Output 3075 ml  Net -2355 ml   Filed Weights   10/13/19 0526 10/14/19 0500 10/14/19 0900  Weight: 77.8 kg 4.536 kg 76.4 kg     Exam:  General: Lying flat in bed with no evidence of orthopnea or tachypnea, asleep  eyes: sclera anicteric, conjuctiva mild injection bilaterally CVS: S1-S2, regular  Respiratory: Lungs are clear.  I hear no rales. GI: NABS, soft, NT  LE: No edema.  Neuro: Sleeping   Assessment & Plan:   Agitation/DTs Appears to be going into DTs, patient is  already on CIWA protocol and will treat with Ativan per protocol.  May need to place patient on standing Ativan if he is not well controlled on as needed's.  Follow closely.  HFrEF Continue Lasix 40 mg p.o. daily  Continue spironolactone, digoxin and losartan No beta-blocker given low output, patient is also using crack Plan was for RHC on Monday patient appears to be going into DTs so that will likely need to be deferred  Hypomagnesemia Repeat magnesium is pending  DM2 Blood sugars under reasonable control on SSI AC at bedtime Patient is noncompliant with medications  Polysubstance use Patient is in precontemplation regarding stopping  Minimally elevated troponin Possibly demand ischemia No CAD on LHC on 05/2018  ALT 106, lipase 70 Abdominal pain is resolved Abdominal ultrasound is unrevealing Can get work-up including hepatitis C panel as an outpatient as warranted   DVT prophylaxis: Lovenox Code Status: Full Family Communication: None Disposition Plan:   Patient is from: Home  Anticipated Discharge Location: Home  Barriers to Discharge: Awaiting right heart cath per cardiology  Is patient medically stable for Discharge: Per cardiology   Consultants:  Heart failure team  Procedures:  None yet  Antimicrobials:  None   Data Reviewed:  Basic Metabolic Panel: Recent Labs  Lab 10/11/19 1645 10/12/19 1130 10/13/19 0600  NA 141 137 139  K 4.4 4.3 3.6  CL 110 107 106  CO2 24 20* 24  GLUCOSE 135* 125* 84  BUN 27* 24* 18  CREATININE 1.23 0.93 1.13  CALCIUM 8.7* 8.4* 8.4*  MG  --   --  1.5*  PHOS  --   --  3.6   Liver Function Tests: Recent Labs  Lab 10/12/19 1130  AST 37  ALT 108*  ALKPHOS 147*  BILITOT 0.6  PROT 6.3*  ALBUMIN 3.3*   Recent Labs  Lab 10/12/19 1130  LIPASE 69*   No results for input(s): AMMONIA in the last 168 hours. CBC: Recent Labs  Lab 10/11/19 1645 10/13/19 0600  WBC 4.6 4.2  HGB 12.0* 12.1*  HCT 39.5 38.3*  MCV  89.4 85.5  PLT 210 208   Cardiac Enzymes: No results for input(s): CKTOTAL, CKMB, CKMBINDEX, TROPONINI in the last 168 hours. BNP (last 3 results) No results for input(s): PROBNP in the last 8760 hours. CBG: Recent Labs  Lab 10/13/19 1205 10/13/19 1656 10/13/19 2105 10/14/19 0840 10/14/19 1148  GLUCAP 158* 173* 236* 114* 155*    Recent Results (from the past 240 hour(s))  SARS Coronavirus 2 by RT PCR (hospital order, performed in [facility] hospital lab) Nasopharyngeal Nasopharyngeal Swab     Status: None   Collection Time: 10/11/19  4:45 PM   Specimen: Nasopharyngeal Swab  Result Value Ref Range Status   SARS Coronavirus 2 NEGATIVE NEGATIVE Final    Comment: (NOTE) SARS-CoV-2 target nucleic acids are NOT DETECTED.  The SARS-CoV-2 RNA is generally detectable in upper and lower respiratory specimens during the acute phase of infection. The lowest concentration of SARS-CoV-2 viral copies this assay can detect is 250 copies / mL. A negative result does not preclude SARS-CoV-2 infection and should not be used as the sole basis for treatment or other patient management decisions.  A negative result may occur with improper specimen collection / handling, submission of specimen other than nasopharyngeal swab, presence of viral mutation(s) within the areas targeted by this assay, and inadequate number of viral copies (<250 copies / mL). A negative result must be combined with clinical observations, patient history, and epidemiological information.  Fact Sheet for Patients:   BoilerBrush.com.cy  Fact Sheet for Healthcare Providers: https://pope.com/  This test is not yet approved or  cleared by the [place] FDA and has been authorized for detection and/or diagnosis of SARS-CoV-2 by FDA under an Emergency Use Authorization (EUA).  This EUA will remain in effect (meaning this test can be used) for the duration of the COVID-19  declaration under Section 564(b)(1) of the Act, 21 U.S.C. section 360bbb-3(b)(1), unless the authorization is terminated or revoked sooner.  Performed at [facility], [address] [place], [place] [address]       Studies: US Abdomen Complete  Result Date: 10/13/2019 CLINICAL DATA:  Elevated lipase EXAM: ABDOMEN ULTRASOUND COMPLETE COMPARISON:  08/01/2018 FINDINGS: Gallbladder: No gallstones or wall thickening visualized. No sonographic Murphy sign noted by sonographer. Common bile duct: Diameter: 4 mm Liver: No focal lesion identified. Within normal limits in parenchymal echogenicity. Portal vein is patent on color Doppler imaging with normal direction of blood flow towards the liver. IVC: No abnormality visualized. Pancreas: Echogenic focus within the ventral aspect of the pancreatic head measures 0.5 x 0.3 x 0.7 cm this may reflect pancreatic calcification. The remainder of the pancreas is unremarkable. Spleen: Size and appearance within normal limits. Right Kidney: Length: 11.2 cm.  Echogenicity is within normal limits. There is a simple 1.2 cm cyst within the ventral midportion. No other renal abnormalities. Left Kidney: Length: 9.9 cm. Echogenicity within normal limits. No mass or hydronephrosis visualized. Abdominal aorta: No aneurysm visualized. Other findings: Trace right pleural effusion is incidentally noted. IMPRESSION: 1. Subcentimeter echogenic focus within the pancreatic head, favor calcification. 2. Trace right pleural effusion. 3. Simple right renal cyst. Electronically Signed   By: Sharlet Salina M.D.   On: 10/13/2019 01:36   ECHOCARDIOGRAM COMPLETE  Result Date: 10/12/2019    ECHOCARDIOGRAM REPORT   Patient Name:   Kerry Hamilton Date of Exam: 10/12/2019 Medical Rec #:  [id number]  Height:       72.0 in Accession #:    [id number] Weight:       175.0 lb Date of Birth:  1965/08/26  BSA:          2.013 m Patient Age:    54 years   BP:           132/85 mmHg Patient Gender: M           HR:           68 bpm. Exam Location:  Inpatient Procedure: 2D Echo Indications:    chest pain 786.50  History:        Patient has prior history of Echocardiogram examinations, most                 recent 06/11/2018. CHF; Risk Factors:Diabetes and polysubstance                 abuse.  Sonographer:    Delcie Roch Referring Phys: [id number] RONDELL A SMITH IMPRESSIONS  1. Left ventricular ejection fraction, by estimation, is 15-20%. The left ventricle has severely decreased function. The left ventricle demonstrates global hypokinesis. The left ventricular internal cavity size was severely dilated. Left ventricular diastolic parameters are consistent with Grade III diastolic dysfunction (restrictive). Elevated left atrial pressure.  2. Right ventricular systolic function is severely reduced. The right ventricular size is severely enlarged. There is moderately elevated pulmonary artery systolic pressure. The estimated right ventricular systolic pressure is 54.8 mmHg.  3. Left atrial size was severely dilated.  4. Right atrial size was moderately dilated.  5. Severe mitral regurgitation. 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 The mitral valve is abnormal. Moderate to severe mitral valve regurgitation.  6. Tricuspid valve regurgitation is moderate to severe.  7. The aortic valve is tricuspid. Aortic valve regurgitation is not visualized. No aortic stenosis is present.  8. The inferior vena cava is normal in size with <50% respiratory variability, suggesting right atrial pressure of 8 mmHg. Comparison(s): No significant change from prior study. FINDINGS  Left Ventricle: Left ventricular ejection fraction, by estimation, is 15-20%. The left ventricle has severely decreased function. The left ventricle demonstrates global hypokinesis. The left ventricular internal cavity size was severely dilated. There is no left ventricular hypertrophy. Left ventricular diastolic parameters are consistent with  Grade III diastolic dysfunction (restrictive). Elevated left atrial pressure. Right Ventricle: The right ventricular size is severely enlarged. No increase in right ventricular wall thickness. Right ventricular systolic function is severely reduced. There is moderately elevated pulmonary artery systolic pressure. The tricuspid regurgitant velocity is 3.42 m/s, and with an assumed right atrial pressure of 8 mmHg, the estimated right ventricular systolic pressure is 54.8 mmHg. Left Atrium: Left atrial size was severely dilated. Right Atrium: Right atrial size was moderately dilated. Pericardium: Trivial pericardial effusion  is present. Mitral Valve: Severe mitral regurgitation. 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 The mitral valve is abnormal. Moderate to severe mitral valve regurgitation. Tricuspid Valve: The tricuspid valve is grossly normal. Tricuspid valve regurgitation is moderate to severe. No evidence of tricuspid stenosis. Aortic Valve: The aortic valve is tricuspid. Aortic valve regurgitation is not visualized. No aortic stenosis is present. Pulmonic Valve: The pulmonic valve was grossly normal. Pulmonic valve regurgitation is trivial. No evidence of pulmonic stenosis. Aorta: The aortic root and ascending aorta are structurally normal, with no evidence of dilitation. Venous: The inferior vena cava is normal in size with less than 50% respiratory variability, suggesting right atrial pressure of 8 mmHg. IAS/Shunts: The atrial septum is grossly normal.  LEFT VENTRICLE PLAX 2D LVIDd:         7.10 cm LVIDs:         6.50 cm LV PW:         1.20 cm LV IVS:        0.90 cm LVOT diam:     2.30 cm LV SV:         25 LV SV Index:   12 LVOT Area:     4.15 cm  LV Volumes (MOD) LV vol d, MOD A4C: 225.0 ml LV vol s, MOD A4C: 185.0 ml LV SV MOD A4C:     225.0 ml RIGHT VENTRICLE            IVC RV S prime:     6.98 cm/s  IVC diam: 2.00 cm TAPSE (M-mode): 1.2 cm LEFT ATRIUM              Index        RIGHT ATRIUM           Index LA diam:        4.80 cm  2.38 cm/m  RA Area:     17.40 cm LA Vol (A2C):   130.0 ml 64.58 ml/m RA Volume:   51.10 ml  25.38 ml/m LA Vol (A4C):   96.0 ml  47.69 ml/m LA Biplane Vol: 115.0 ml 57.13 ml/m  AORTIC VALVE LVOT Vmax:   41.10 cm/s LVOT Vmean:  26.600 cm/s LVOT VTI:    0.060 m  AORTA Ao Root diam: 3.00 cm Ao Asc diam:  3.00 cm MITRAL VALVE               TRICUSPID VALVE MV Area (PHT): 4.39 cm    TR Peak grad:   46.8 mmHg MV Decel Time: 173 msec    TR Vmax:        342.00 cm/s MR Peak grad: 104.0 mmHg MR Mean grad: 66.0 mmHg    SHUNTS MR Vmax:      510.00 cm/s  Systemic VTI:  0.06 m MR Vmean:     380.0 cm/s   Systemic Diam: 2.30 cm MV E velocity: 81.00 cm/s MV A velocity: 24.00 cm/s MV E/A ratio:  3.38 Lennie Odor MD Electronically signed by Lennie Odor MD Signature Date/Time: 10/12/2019/5:36:12 PM    Final      Scheduled Meds: . aspirin  81 mg Oral Daily  . digoxin  0.125 mg Oral Daily  . enoxaparin (LOVENOX) injection  40 mg Subcutaneous Q24H  . folic acid  1 mg Oral Daily  . furosemide  40 mg Oral Daily  . insulin aspart  0-5 Units Subcutaneous QHS  . insulin aspart  0-9 Units Subcutaneous TID WC  . losartan  25 mg Oral  Daily  . multivitamin with minerals  1 tablet Oral Daily  . sodium chloride flush  3 mL Intravenous Q12H  . spironolactone  12.5 mg Oral Daily  . thiamine  100 mg Oral Daily   Or  . thiamine  100 mg Intravenous Daily   Continuous Infusions: . sodium chloride      Principal Problem:   Acute on chronic systolic CHF (congestive heart failure) (HCC) Active Problems:   Diabetes mellitus type 2, noninsulin dependent (HCC)   Elevated troponin   Polysubstance abuse (HCC)     Chizara Mena Tublu Blanca Carreon, [facility]  If 7PM-7AM, please contact night-coverage [email or web address] Password [email or web address] 10/14/2019, 12:27 PM    LOS: 2 days

## 2019-10-15 DIAGNOSIS — I5023 Acute on chronic systolic (congestive) heart failure: Secondary | ICD-10-CM | POA: Diagnosis not present

## 2019-10-15 LAB — BASIC METABOLIC PANEL
Anion gap: 7 (ref 5–15)
BUN: 18 mg/dL (ref 6–20)
CO2: 23 mmol/L (ref 22–32)
Calcium: 9.1 mg/dL (ref 8.9–10.3)
Chloride: 107 mmol/L (ref 98–111)
Creatinine, Ser: 1.28 mg/dL — ABNORMAL HIGH (ref 0.61–1.24)
GFR calc Af Amer: >60 mL/min (ref >60)
GFR calc non Af Amer: >60 mL/min (ref >60)
Glucose, Bld: 98 mg/dL (ref 70–99)
Potassium: 4.5 mmol/L (ref 3.5–5.1)
Sodium: 137 mmol/L (ref 135–145)

## 2019-10-15 LAB — GLUCOSE, CAPILLARY
Glucose-Capillary: 126 mg/dL — ABNORMAL HIGH (ref 70–99)
Glucose-Capillary: 113 mg/dL — ABNORMAL HIGH (ref 70–99)
Glucose-Capillary: 162 mg/dL — ABNORMAL HIGH (ref 70–99)
Glucose-Capillary: 97 mg/dL (ref 70–99)
Glucose-Capillary: 186 mg/dL — ABNORMAL HIGH (ref 70–99)

## 2019-10-15 NOTE — Progress Notes (Addendum)
Advanced Heart Failure Rounding Note   Subjective:   Yesterday he refused aspirin, dig, lovenox, folic acid, lasix, and MVI. He only took IV ativan.    Hungry wants to eat.   Objective:   Weight Range:  Vital Signs:   Temp:  [97.5 F (36.4 C)-98.4 F (36.9 C)] 98.1 F (36.7 C) (09/20 0430) Pulse Rate:  [58-63] 58 (09/20 0430) Resp:  [16-18] 16 (09/20 0430) BP: (95-131)/(60-87) 95/60 (09/20 0430) SpO2:  [91 %-100 %] 98 % (09/20 0430) Weight:  [76.2 kg-76.4 kg] 76.2 kg (09/20 0430) Last BM Date: 10/12/19  Weight change: Filed Weights   10/14/19 0500 10/14/19 0900 10/15/19 0430  Weight: 4.536 kg 76.4 kg 76.2 kg    Intake/Output:   Intake/Output Summary (Last 24 hours) at 10/15/2019 0803 Last data filed at 10/15/2019 0140 Gross per 24 hour  Intake 800 ml  Output 500 ml  Net 300 ml     Physical Exam: General:   No resp difficulty HEENT: normal Neck: supple. no JVD. Carotids 2+ bilat; no bruits. No lymphadenopathy or thryomegaly appreciated. Cor: PMI nondisplaced. Regular rate & rhythm. No rubs, gallops or murmurs. Lungs: clear Abdomen: soft, nontender, nondistended. No hepatosplenomegaly. No bruits or masses. Good bowel sounds. Extremities: no cyanosis, clubbing, rash, edema Neuro: alert & orientedx3, cranial nerves grossly intact. moves all 4 extremities w/o difficulty. Affect flat.    Telemetry: SR 60s    Labs: Basic Metabolic Panel: Recent Labs  Lab 10/11/19 1645 10/11/19 1645 10/12/19 1130 10/12/19 1130 10/13/19 0600 10/14/19 1248 10/15/19 0633  NA 141  --  137  --  139 136 137  K 4.4  --  4.3  --  3.6 4.4 4.5  CL 110  --  107  --  106 106 107  CO2 24  --  20*  --  24 23 23   GLUCOSE 135*  --  125*  --  84 102* 98  BUN 27*  --  24*  --  18 16 18   CREATININE 1.23  --  0.93  --  1.13 1.13 1.28*  CALCIUM 8.7*   < > 8.4*   < > 8.4* 8.7* 9.1  MG  --   --   --   --  1.5* 2.2  --   PHOS  --   --   --   --  3.6  --   --    < > = values in this  interval not displayed.    Liver Function Tests: Recent Labs  Lab 10/12/19 1130  AST 37  ALT 108*  ALKPHOS 147*  BILITOT 0.6  PROT 6.3*  ALBUMIN 3.3*   Recent Labs  Lab 10/12/19 1130  LIPASE 69*   No results for input(s): AMMONIA in the last 168 hours.  CBC: Recent Labs  Lab 10/11/19 1645 10/13/19 0600 10/14/19 1248  WBC 4.6 4.2 4.1  HGB 12.0* 12.1* 13.5  HCT 39.5 38.3* 43.6  MCV 89.4 85.5 87.4  PLT 210 208 244    Cardiac Enzymes: No results for input(s): CKTOTAL, CKMB, CKMBINDEX, TROPONINI in the last 168 hours.  BNP: BNP (last 3 results) Recent Labs    11/15/18 1418 04/17/19 1758 10/11/19 1645  BNP 292.9* 1,169.4* 2,015.6*    ProBNP (last 3 results) No results for input(s): PROBNP in the last 8760 hours.    Other results:  Imaging: No results found.   Medications:     Scheduled Medications: . aspirin  81 mg Oral Daily  .  digoxin  0.125 mg Oral Daily  . enoxaparin (LOVENOX) injection  40 mg Subcutaneous Q24H  . folic acid  1 mg Oral Daily  . furosemide  40 mg Oral Daily  . insulin aspart  0-5 Units Subcutaneous QHS  . insulin aspart  0-9 Units Subcutaneous TID WC  . losartan  25 mg Oral Daily  . multivitamin with minerals  1 tablet Oral Daily  . sodium chloride flush  3 mL Intravenous Q12H  . spironolactone  25 mg Oral Daily  . thiamine  100 mg Oral Daily   Or  . thiamine  100 mg Intravenous Daily    Infusions: . sodium chloride      PRN Medications: sodium chloride, acetaminophen, LORazepam **OR** LORazepam, nicotine polacrilex, sodium chloride flush   Assessment/Plan:   1. Acute on Chronic Systolic HF - NICM, suspect ETOH - echo 5/20 EF 15%, RV moderately reduce  - R/LHC 5/20 showed no CAD and preserved CO, CI 2.2  - now admitted w/ a/c CHF with concern for low output, NYHA Class IIIB, in the setting of poor med complaince and ongoing polysubstance abuse  - Echo reviewed personally EF 15-20% severe RV dysfunction. Mod  MR - He refused all meds except ativan on 9/19.  - Continue spiro, losartan dig, losartan, lasix .   - No b-blocker yet with possible low output -Defer cath given ongoing noncompliance.  - refer to paramedicine - not a candidate for advanced therapies given poor compliance and active substance use   2. DM2 - HgbA1c 8.1 in 9/20 - repeat Hgb A1c pending  - Consider SGLT2i  3. Polysubstance abuse with possibe ETOH withdrawal - pt admits that he smokes half a pack cigarettes per day on average, drinks 3 to 4 "forties"per week on average, and last smoked "crack"approximately 3 days - primary team managing withdrawal symptoms   4. Chest Pain w/ minimally elevated Hs Troponin  - doubt ischemic - LHC 05/2018 showed no CAD  5. Hypomag - Resolved after mag supp.     Length of Stay: 3   Amy Clegg NP-C  10/15/2019, 8:03 AM  Advanced Heart Failure Team Pager [phone] (M-F; 7a - 4p)  Please contact [facility] for night-coverage after hours (4p -7a ) and weekends on [email or web address]  Patient seen and examined with the above-signed Advanced Practice Provider and/or Housestaff. I personally reviewed laboratory data, imaging studies and relevant notes. I independently examined the patient and formulated the important aspects of the plan. I have edited the note to reflect any of my changes or salient points. I have personally discussed the plan with the patient and/or family.  More calm today. Refused all meds yesterday in setting of probable ETOH withdrawal. Denies SOB, orthopnea or PND.   General:  Sitting up in bed  No resp difficulty HEENT: normal Neck: supple. no JVD. Carotids 2+ bilat; no bruits. No lymphadenopathy or thryomegaly appreciated. Cor: PMI nondisplaced. Regular rate & rhythm. No rubs, gallops or murmurs. Lungs: clear Abdomen: soft, nontender, nondistended. No hepatosplenomegaly. No bruits or masses. Good bowel sounds. Extremities: no cyanosis, clubbing, rash,  edema Neuro: alert & orientedx3, cranial nerves grossly intact. moves all 4 extremities w/o difficulty. Affect pleasant  He is stable from HF standpoint. I am ok with discharging on current HF meds when medically cleared. Will attempt to arrange outpatient Paramedicine f/u.   Lasix 40 daily Spiro 25 daily Losartan 25 daily  Digoxin 0.125 daily  Would hold b-blocker for now with low output on admit.  Arvilla Meres, MD  9:31 AM

## 2019-10-15 NOTE — Progress Notes (Signed)
  ReDS Clip Diuretic Study Pt study # [id number]  Your patient has been enrolled in the ReDS Clip Diuretic Study  Uop -500 mL/24 hr w/ 2 undocumented occurrences. Wt documented down 1 lb. Patient continues to not be willing to participate in reading today after multiple attempts.   Will unenroll from trial at this time given lack of participation and inability to obtain ReDS readings.  Provider contacted: Tonye Becket, NP    REDS Clip  READING=  Unable to obtain   CHEST RULER = 38 Clip Station = D   Orthodema score = 1 Signs/Symptoms Score   Mild edema, no orthopnea 0 No congestion  Moderate edema, no orthopnea 1 Low-grade orthodema/congestion  Severe edema OR orthopnea 2   Moderate edema and orthopnea 3 High-grade orthodema/congestion  Severe edema AND orthopnea 4    Sherron Monday, PharmD, BCCCP Clinical Pharmacist  Phone: [phone] 10/15/2019 8:20 AM  Please check AMION for all [facility] phone numbers After 10:00 PM, call Main Pharmacy [phone]

## 2019-10-15 NOTE — Progress Notes (Addendum)
Unable to measure urine output today because patient refused to use urinal. Patient had total 5 urine occurrences in the bathroom.

## 2019-10-15 NOTE — Social Work (Signed)
CSW tried to provide SA resources/assessment, pt non responsive, would open eyes but not say anything and then close his eyes back again. Per RN, patient going through alcohol withdrawal.

## 2019-10-15 NOTE — Progress Notes (Signed)
PROGRESS NOTE    Kerry Hamilton  ZOX:[id number]  DOB: Jan 13, 1966  DOA: 10/11/2019 PCP: Medicine, [facility]:   Hospital course:  54 year old man with EF 15%, DM 2 was admitted yesterday with shortness of breath and chest pain.  She had been drinking a lot and using crack for the last week.  Laboratory data was noted for decompensated heart failure and troponin around 25.  Patient was treated with Lasix IV and seen by the advanced heart failure team.  On day 3 after admission patient was noted to be talking to himself and somewhat confused.  Patient had been on CIWA protocol and is thought to be in DTs.  Ativan has been effective in controlling his agitation.   Subjective:  Patient looks confused, does not remember me when I tell him who I am.  Patient thinks he might be in the hospital but is not sure.  He is not agitated but clearly confused.   Objective: Vitals:   10/14/19 1944 10/15/19 0430 10/15/19 0829 10/15/19 1116  BP: 111/70 95/60 109/77 114/84  Pulse: 60 (!) 58 61 62  Resp: 16 16  16   Temp: 98.4 F (36.9 C) 98.1 F (36.7 C)  98.3 F (36.8 C)  TempSrc: Oral Oral    SpO2: 99% 98% 98% 98%  Weight:  76.2 kg    Height:        Intake/Output Summary (Last 24 hours) at 10/15/2019 1605 Last data filed at 10/15/2019 1322 Gross per 24 hour  Intake 700 ml  Output --  Net 700 ml   Filed Weights   10/14/19 0500 10/14/19 0900 10/15/19 0430  Weight: 4.536 kg 76.4 kg 76.2 kg     Exam:  General: Sitting up in bed feeding himself scrambled eggs with his fingers looking confused.   Eyes: sclera anicteric, conjuctiva mild injection bilaterally CVS: S1-S2, regular  Respiratory: Lungs are clear.  I hear no rales. GI: NABS, soft, NT  LE: No edema.  Neuro: Confused  Assessment & Plan:   Agitation/DTs Patient seems to be in DTs, is not agitated and is being well managed on present CIWA protocol.  HFrEF Continue Lasix 40 mg p.o. daily   Continue spironolactone, digoxin and losartan--although patient is refusing his medications. No beta-blocker given low output, patient is also using crack Patient being followed by advanced heart failure team who recommend deferring catheterization given ongoing noncompliance.  Hypomagnesemia Normalized with treatment  DM2 Blood sugars under reasonable control on SSI AC at bedtime Patient is noncompliant with medications  Polysubstance use Patient is in precontemplation regarding stopping  Minimally elevated troponin Possibly demand ischemia No CAD on LHC on 05/2018  ALT 106, lipase 70 Abdominal pain is resolved Abdominal ultrasound is unrevealing Can get work-up including hepatitis C panel as an outpatient as warranted   DVT prophylaxis: Lovenox Code Status: Full Family Communication: None Disposition Plan:   Patient is from: Home  Anticipated Discharge Location: Home  Barriers to Discharge: Awaiting right heart cath per cardiology  Is patient medically stable for Discharge: Per cardiology   Consultants:  Heart failure team  Procedures:  None yet  Antimicrobials:  None   Data Reviewed:  Basic Metabolic Panel: Recent Labs  Lab 10/11/19 1645 10/12/19 1130 10/13/19 0600 10/14/19 1248 10/15/19 0633  NA 141 137 139 136 137  K 4.4 4.3 3.6 4.4 4.5  CL 110 107 106 106 107  CO2 24 20* 24 23 23   GLUCOSE 135* 125* 84 102* 98  BUN 27* 24* 18 16 18   CREATININE 1.23 0.93 1.13 1.13 1.28*  CALCIUM 8.7* 8.4* 8.4* 8.7* 9.1  MG  --   --  1.5* 2.2  --   PHOS  --   --  3.6  --   --    Liver Function Tests: Recent Labs  Lab 10/12/19 1130  AST 37  ALT 108*  ALKPHOS 147*  BILITOT 0.6  PROT 6.3*  ALBUMIN 3.3*   Recent Labs  Lab 10/12/19 1130  LIPASE 69*   No results for input(s): AMMONIA in the last 168 hours. CBC: Recent Labs  Lab 10/11/19 1645 10/13/19 0600 10/14/19 1248  WBC 4.6 4.2 4.1  HGB 12.0* 12.1* 13.5  HCT 39.5 38.3* 43.6  MCV 89.4 85.5  87.4  PLT 210 208 244   Cardiac Enzymes: No results for input(s): CKTOTAL, CKMB, CKMBINDEX, TROPONINI in the last 168 hours. BNP (last 3 results) No results for input(s): PROBNP in the last 8760 hours. CBG: Recent Labs  Lab 10/14/19 2105 10/14/19 2121 10/15/19 0130 10/15/19 0627 10/15/19 1117  GLUCAP 108* 101* 126* 113* 162*    Recent Results (from the past 240 hour(s))  SARS Coronavirus 2 by RT PCR (hospital order, performed in [facility] hospital lab) Nasopharyngeal Nasopharyngeal Swab     Status: None   Collection Time: 10/11/19  4:45 PM   Specimen: Nasopharyngeal Swab  Result Value Ref Range Status   SARS Coronavirus 2 NEGATIVE NEGATIVE Final    Comment: (NOTE) SARS-CoV-2 target nucleic acids are NOT DETECTED.  The SARS-CoV-2 RNA is generally detectable in upper and lower respiratory specimens during the acute phase of infection. The lowest concentration of SARS-CoV-2 viral copies this assay can detect is 250 copies / mL. A negative result does not preclude SARS-CoV-2 infection and should not be used as the sole basis for treatment or other patient management decisions.  A negative result may occur with improper specimen collection / handling, submission of specimen other than nasopharyngeal swab, presence of viral mutation(s) within the areas targeted by this assay, and inadequate number of viral copies (<250 copies / mL). A negative result must be combined with clinical observations, patient history, and epidemiological information.  Fact Sheet for Patients:   10/13/19  Fact Sheet for Healthcare Providers: BoilerBrush.com.cy  This test is not yet approved or  cleared by the https://pope.com/ FDA and has been authorized for detection and/or diagnosis of SARS-CoV-2 by FDA under an Emergency Use Authorization (EUA).  This EUA will remain in effect (meaning this test can be used) for the duration of the COVID-19  declaration under Section 564(b)(1) of the Act, 21 U.S.C. section 360bbb-3(b)(1), unless the authorization is terminated or revoked sooner.  Performed at [facility], [address] [place], [place]       Studies: No results found.   Scheduled Meds: . aspirin  81 mg Oral Daily  . digoxin  0.125 mg Oral Daily  . enoxaparin (LOVENOX) injection  40 mg Subcutaneous Q24H  . folic acid  1 mg Oral Daily  . furosemide  40 mg Oral Daily  . insulin aspart  0-5 Units Subcutaneous QHS  . insulin aspart  0-9 Units Subcutaneous TID WC  . losartan  25 mg Oral Daily  . multivitamin with minerals  1 tablet Oral Daily  . sodium chloride flush  3 mL Intravenous Q12H  . spironolactone  25 mg Oral Daily  . thiamine  100 mg Oral Daily   Or  .  thiamine  100 mg Intravenous Daily   Continuous Infusions: . sodium chloride      Principal Problem:   Acute on chronic systolic CHF (congestive heart failure) (HCC) Active Problems:   Diabetes mellitus type 2, noninsulin dependent (HCC)   Elevated troponin   Polysubstance abuse (HCC)     Axcel Horsch Tublu Ayeisha Lindenberger, [facility]  If 7PM-7AM, please contact night-coverage [email or web address] Password [email or web address] 10/15/2019, 4:05 PM    LOS: 3 days

## 2019-10-16 ENCOUNTER — Inpatient Hospital Stay (HOSPITAL_COMMUNITY): Payer: Medicaid Other

## 2019-10-16 DIAGNOSIS — I5023 Acute on chronic systolic (congestive) heart failure: Secondary | ICD-10-CM | POA: Diagnosis not present

## 2019-10-16 LAB — BASIC METABOLIC PANEL
Anion gap: 11 (ref 5–15)
BUN: 17 mg/dL (ref 6–20)
CO2: 22 mmol/L (ref 22–32)
Calcium: 9.3 mg/dL (ref 8.9–10.3)
Chloride: 103 mmol/L (ref 98–111)
Creatinine, Ser: 1.18 mg/dL (ref 0.61–1.24)
GFR calc Af Amer: >60 mL/min (ref >60)
GFR calc non Af Amer: >60 mL/min (ref >60)
Glucose, Bld: 87 mg/dL (ref 70–99)
Potassium: 4.3 mmol/L (ref 3.5–5.1)
Sodium: 136 mmol/L (ref 135–145)

## 2019-10-16 LAB — GLUCOSE, CAPILLARY
Glucose-Capillary: 92 mg/dL (ref 70–99)
Glucose-Capillary: 135 mg/dL — ABNORMAL HIGH (ref 70–99)
Glucose-Capillary: 115 mg/dL — ABNORMAL HIGH (ref 70–99)
Glucose-Capillary: 106 mg/dL — ABNORMAL HIGH (ref 70–99)
Glucose-Capillary: 130 mg/dL — ABNORMAL HIGH (ref 70–99)

## 2019-10-16 MED ORDER — THIAMINE HCL 100 MG/ML IJ SOLN: 100.0000 mg | Freq: Every day | INTRAMUSCULAR | Status: DC

## 2019-10-16 MED ORDER — LORAZEPAM 2 MG/ML IJ SOLN
1.0000 mg | INTRAMUSCULAR | Status: AC | PRN
  Administered 2019-10-17: 3 mg via INTRAVENOUS
  Filled 2019-10-16: qty 1
  Filled 2019-10-16: qty 2

## 2019-10-16 MED ORDER — THIAMINE HCL 100 MG/ML IJ SOLN
500.0000 mg | Freq: Three times a day (TID) | INTRAVENOUS | Status: AC
  Administered 2019-10-16 – 2019-10-18 (×6): 500 mg via INTRAVENOUS
  Filled 2019-10-16 (×6): qty 5

## 2019-10-16 MED ORDER — STROKE: EARLY STAGES OF RECOVERY BOOK: Freq: Once | Status: AC

## 2019-10-16 MED ORDER — THIAMINE HCL 100 MG/ML IJ SOLN
250.0000 mg | Freq: Every day | INTRAVENOUS | Status: AC
  Administered 2019-10-18 – 2019-10-23 (×6): 250 mg via INTRAVENOUS
  Filled 2019-10-16 (×9): qty 2.5

## 2019-10-16 MED ORDER — LORAZEPAM 1 MG PO TABS
1.0000 mg | ORAL_TABLET | ORAL | Status: AC | PRN
  Administered 2019-10-19: 2 mg via ORAL
  Filled 2019-10-16: qty 2

## 2019-10-16 NOTE — Plan of Care (Signed)
  Problem: Health Behavior/Discharge Planning: Goal: Ability to manage health-related needs will improve Outcome: Not Progressing   

## 2019-10-16 NOTE — Consult Note (Signed)
NEUROLOGY CONSULTATION NOTE   Date of service: October 16, 2019 Patient Name: Kerry Hamilton MRN:  [id number] DOB:  03/30/1965 Reason for consult: "Storke code"  History of Present Illness  Kerry Hamilton is a 54 y.o. male with PMH significant for depression, DM2, CHF with EF of 15%, EtOH use, cocaine use who is admitted with SOB and Chest pain. Was on Ativan PRN per CIWA protocol for EtOH withdrawal. He was noted to have difficulty with language specifically with garbled speech and not following commands. A CTH was obtained and was notable for a acute/subacute right posterior MCA territory infarct.  Patient is unable to provide any meaningful hx. He is perseverating and keeps on repeating the same thing. He can form full sentences that are not relevant to the situation. He is unable to comprehend.    ROS   Unable to obtain given aphasia and encephalopathy  Past History   Past Medical History:  Diagnosis Date  . Cocaine abuse (HCC)   . Depression   . Diabetes mellitus without complication (HCC)   . Systolic congestive heart failure (HCC)   . Tobacco abuse    Past Surgical History:  Procedure Laterality Date  . RIGHT/LEFT HEART CATH AND CORONARY ANGIOGRAPHY N/A 06/13/2018   Procedure: RIGHT/LEFT HEART CATH AND CORONARY ANGIOGRAPHY;  Surgeon: [place], Peter M, MD;  Location: [facility] INVASIVE CV LAB;  Service: Cardiovascular;  Laterality: N/A;   Family History  Problem Relation Age of Onset  . CAD Paternal Grandmother   . Diabetes Mellitus II Neg Hx    Social History   Socioeconomic History  . Marital status: Single    Spouse name: Not on file  . Number of children: Not on file  . Years of education: Not on file  . Highest education level: Not on file  Occupational History  . Not on file  Tobacco Use  . Smoking status: Current Every Day Smoker    Packs/day: 1.00    Types: Cigarettes, Cigars  . Smokeless tobacco: Never Used  Vaping Use  . Vaping Use: Never used  Substance and  Sexual Activity  . Alcohol use: Yes    Alcohol/week: 6.0 standard drinks    Types: 6 Cans of beer per week    Comment: daily  . Drug use: Yes    Types: "Crack" cocaine  . Sexual activity: Not Currently  Other Topics Concern  . Not on file  Social History Narrative  . Not on file   Social Determinants of Health   Financial Resource Strain:   . Difficulty of Paying Living Expenses: Not on file  Food Insecurity:   . Worried About Programme researcher, broadcasting/film/video in the Last Year: Not on file  . Ran Out of Food in the Last Year: Not on file  Transportation Needs:   . Lack of Transportation (Medical): Not on file  . Lack of Transportation (Non-Medical): Not on file  Physical Activity:   . Days of Exercise per Week: Not on file  . Minutes of Exercise per Session: Not on file  Stress:   . Feeling of Stress : Not on file  Social Connections:   . Frequency of Communication with Friends and Family: Not on file  . Frequency of Social Gatherings with Friends and Family: Not on file  . Attends Religious Services: Not on file  . Active Member of Clubs or Organizations: Not on file  . Attends Banker Meetings: Not on file  . Marital Status: Not on file  No Known Allergies  Medications   Medications Prior to Admission  Medication Sig Dispense Refill Last Dose  . carvedilol (COREG) 3.125 MG tablet Take 1 tablet (3.125 mg total) by mouth 2 (two) times daily with a meal. (Patient not taking: Reported on 10/12/2019) 60 tablet 2 Not Taking at Unknown time  . FLUoxetine (PROZAC) 20 MG capsule Take 1 capsule (20 mg total) by mouth daily. (Patient not taking: Reported on 10/12/2019) 30 capsule 0 Not Taking at Unknown time  . furosemide (LASIX) 40 MG tablet Take 1 tablet (40 mg total) by mouth daily. (Patient not taking: Reported on 10/12/2019) 30 tablet 2 Not Taking at Unknown time  . losartan (COZAAR) 25 MG tablet Take 0.5 tablets (12.5 mg total) by mouth daily. 15 tablet 2   . pantoprazole  (PROTONIX) 40 MG tablet TAKE 1 TABLET(40 MG) BY MOUTH DAILY (Patient not taking: Reported on 10/12/2019) 90 tablet 0 Not Taking at Unknown time  . QUEtiapine (SEROQUEL) 50 MG tablet Take 1 tablet (50 mg total) by mouth at bedtime. (Patient not taking: Reported on 10/12/2019) 30 tablet 0 Not Taking at Unknown time  . spironolactone (ALDACTONE) 25 MG tablet Take 0.5 tablets (12.5 mg total) by mouth daily. 45 tablet 3      Vitals  Temp:  [97.6 F (36.4 C)-98.3 F (36.8 C)] 97.6 F (36.4 C) (09/21 1445) Pulse Rate:  [55-60] 58 (09/21 1445) Resp:  [16-19] 19 (09/21 1445) BP: (103-120)/(61-72) 103/61 (09/21 1445) SpO2:  [98 %-99 %] 98 % (09/21 1445) Weight:  [74.3 kg] 74.3 kg (09/21 0437)  Body mass index is 22.22 kg/m.  Physical Exam   General: Laying comfortably in bed; in no acute distress.  HENT: Normal oropharynx and mucosa. Normal external appearance of ears and nose. Neck: Supple, no pain or tenderness CV: No JVD. No peripheral edema. Pulmonary: Symmetric Chest rise. Normal respiratory effort. Abdomen: Soft to touch, non-tender Ext: No cyanosis, edema, or deformity  Skin: No rash. Normal palpation of skin.   Musculoskeletal: Normal digits and nails by inspection. No clubbing.  Neurologic Examination  Mental status/Cognition: Alert, awake, looks around, unbale to comprehend any orientation questions. Speech/language: Fluent, no dysarthria, unable to comprehend, unable to name objects, unable to repeat. Forms full sentences that are gramatically correct but not relevant to the situation. Cranial nerves:   CN II Pupils equal and reactive to light, no VF deficits   CN III,IV,VI Follows with eyes across the room, blinks to threat, no observable nystagmus, no gaze deviation.   CN V Responds to touch on both sides of face.   CN VII no asymmetry, no nasolabial fold flattening   CN VIII Turns head towards the speech   CN IX & X    CN XI    CN XII    Motor:  Muscle bulk: normal, tone  normal Unable to do detailed testing as he does not comprehend or follow commands, moves all extremities spontaneously and antigravity. Was noted by staff earlier to be walking in the corridor out of his room. Reflexes:  Right Left Comments  Pectoralis      Biceps (C5/6) 1 1   Brachioradialis (C5/6) 1 1    Triceps (C6/7) 1 1    Patellar (L3/4) 1 1    Achilles (S1) 1 1    Hoffman      Plantar     Jaw jerk    Sensation:  Light touch Intact in all extremitites   Pin prick    Temperature  Vibration   Proprioception    Coordination/Complex Motor:  Unable to assess given difficulty following commands.  Labs   Lab Results  Component Value Date   NA 136 10/16/2019   K 4.3 10/16/2019   CL 103 10/16/2019   CO2 22 10/16/2019   GLUCOSE 87 10/16/2019   BUN 17 10/16/2019   CREATININE 1.18 10/16/2019   CALCIUM 9.3 10/16/2019   ALBUMIN 3.3 (L) 10/12/2019   AST 37 10/12/2019   ALT 108 (H) 10/12/2019   ALKPHOS 147 (H) 10/12/2019   BILITOT 0.6 10/12/2019   GFRNONAA >60 10/16/2019   GFRAA >60 10/16/2019     Imaging and Diagnostic studies  CTH without contrast: IMPRESSION: 1. Findings consistent with an acute/subacute right posterior MCA territory infarct. MRI could further characterize, if clinically indicated. 2. Mild local mass effect without midline shift. No mass occupying hemorrhagic transformation.  Impression   Kerry Hamilton is a 54 y.o. male with PMH significant for  depression, DM2, CHF with EF of 15%, EtOH use, cocaine use who is admitted with SOB and Chest pain. Found to have a R MCA stroke during workup for his aphasia out of proportion to encephalopathy. His neurologic examination is notable for aphasia out of proportion to encephalopathy but no arm or leg weakness. His inability to comprehend or follow commands limits his exam.  Recommendations   - Brain imaging- MRI Brain pending - Vascular imaging- MRA head without contrast and carotid doppler - TTE -  completed a few days ago with EF of 15% - Lipid panel - ordered  - Statin - to be ordered if LDL > 70 - A1C - ordered  - Antithrombotic - continue aspirin 81mg  daily - DVT ppx - SQ heparin - SBP goal - gradual normotension  - Telemetry monitoring for arrythmia - Swallow screen - Stroke education - PT/OT/SLP consults - High dose thiamine replacement protocol, given his CHF and EtOH use with encephalopathy suspect that there might be a component of underlyinh wernicke's and wet beriberi.  ______________________________________________________________________   Thank you for the opportunity to take part in the care of this patient. If you have any further questions, please contact the neurology consultation attending.  Signed,  [facility] Pager Number Erick Blinks

## 2019-10-16 NOTE — Progress Notes (Signed)
Pt with increased confusion. VS stable and ciwa of 4. Provider to bedside and discussed pt's symptoms. Pt to get a ct scan of the head. Will continue to monitor pt.

## 2019-10-16 NOTE — Progress Notes (Signed)
PROGRESS NOTE    Kerry Hamilton  XBM:[id number] DOB: 01-17-1966 DOA: 10/11/2019 PCP: Medicine, [facility]   Chief Complaint  Patient presents with  . Chest Pain    Brief  As per chart: 54 year old man with EF 15%, DM 2 was admitted yesterday with shortness of breath and chest pain.  She had been drinking a lot and using crack for the last week.  Laboratory data was noted for decompensated heart failure and troponin around 25.Patient was treated with Lasix IV and seen by the advanced heart failure team.  On day 3 after admission patient was noted to be talking to himself and somewhat confused.  Patient had been on CIWA protocol and is thought to be in DTs. Ativan has been effective in controlling his agitation.  9/21: Continues to be confused, not following instruction moving upper extremities, CT head obtained that showed acute stroke and neurology consulted.  Subjective: Patient is alert, awake but unable to answer other questions.  Confused, trying to speak, does not follow commands. Able to move his B/L UE well, and able to move LE as well-exam is limited as he is not following instruction. Nursing reports he has been confused even yesterday and appears to be more today.  CT head ordered to eval this am   Assessment & Plan:  Acute on chronic systolic CHF 2/2 Nonischemic cardiomyopathy, suspect EtOH abuse, echo 5/20 EF 15% right/left HC 5/20-no CAD and preserved cardiac output currently admitted with low output CHF with poor medical compliance and ongoing polysubstance abuse.  Cardiology following closely EF showed 15 to 20% on the echo with severe RV dysfunction, moderate MR patient refused all meds except Ativan on 9/19.  On a spironolactone, losartan digoxin Lasix and a beta-blocker possible.  Stable from CHF standpoint no further work-up planned per cardiology.  Confusion/acute encephalopathy: possible DTs/agitation:Initially noted on day 3, has been more confused seems to be  moving upper extremities well, not following instruction.  CT head obtained thsi am and it that showed acute CVA-neurology consulted, discussed with Dr. Iver Nestle, last known normal is unknown, has been confused since day 3 post admission.Transfer to neuro floor, slp eval, npo until slp eval, keep on neuro check and further work-up as per neurology. lipdid panel in am.  Polysubstance abuse with possible EtOH withdrawal.  Monitor closely.  Also complicated by his stroke, keep on  fall precaution, supportive care.  Diabetes mellitus type 2, noninsulin dependent.  Stable hemoglobin A1c 6.5,blood sugar controlled.  Keep on sliding scale insulin. Recent Labs  Lab 10/15/19 1117 10/15/19 1608 10/15/19 2113 10/16/19 0632 10/16/19 1133  GLUCAP 162* 97 186* 92 135*    Elevated troponin/chest pain, troponin mildly elevated: Seen by cardiology doubt ischemic no CAD on catheter in 2020.  Mild transaminitis abdomen ultrasound unrevealing, follow-up outpatient hepatitis panel. Check lfts/hepatitisc panel in next lab Recent Labs  Lab 10/12/19 1130  AST 37  ALT 108*  ALKPHOS 147*  BILITOT 0.6  PROT 6.3*  ALBUMIN 3.3*  INR 1.2   DVT prophylaxis: enoxaparin (LOVENOX) injection 40 mg Start: 10/12/19 1600 Code Status:   Code Status: Full Code   Family Communication: plan of care discussed with patient at bedside.  Status is: Inpatient Remains inpatient appropriate because:Acute stroke, confusion, acute systolic CHF  Dispo: The patient is from: Home              Anticipated d/c is to: SNF  Anticipated d/c date is: > 3 days              Patient currently is not medically stable to d/c. Nutrition: Diet Order            Diet heart healthy/carb modified Room service appropriate? Yes; Fluid consistency: Thin  Diet effective now                 Body mass index is 22.22 kg/m. Consultants:see note  Procedures:see note Microbiology:see note Blood Culture    Component Value Date/Time    SDES  06/10/2018 1803    BLOOD RIGHT FOREARM Performed at [facility], [address] [place], [place] [address]    SPECREQUEST  06/10/2018 1803    BOTTLES DRAWN AEROBIC ONLY Blood Culture results may not be optimal due to an inadequate volume of blood received in culture bottles Performed at [facility], [address] [place], [place] [address]    CULT  06/10/2018 1803    NO GROWTH 5 DAYS Performed at [facility], [address], [place], [place] [address]    REPTSTATUS 06/16/2018 FINAL 06/10/2018 1803    Other culture-see note  Medications: Scheduled Meds: . aspirin  81 mg Oral Daily  . digoxin  0.125 mg Oral Daily  . enoxaparin (LOVENOX) injection  40 mg Subcutaneous Q24H  . folic acid  1 mg Oral Daily  . furosemide  40 mg Oral Daily  . insulin aspart  0-5 Units Subcutaneous QHS  . insulin aspart  0-9 Units Subcutaneous TID WC  . losartan  25 mg Oral Daily  . multivitamin with minerals  1 tablet Oral Daily  . sodium chloride flush  3 mL Intravenous Q12H  . spironolactone  25 mg Oral Daily  . thiamine  100 mg Oral Daily   Or  . thiamine  100 mg Intravenous Daily   Continuous Infusions: . sodium chloride      Antimicrobials: Anti-infectives (From admission, onward)   None     Objective: Vitals: Today's Vitals   10/15/19 2100 10/16/19 0437 10/16/19 0830 10/16/19 1133  BP:  103/65  115/71  Pulse:  (!) 55  (!) 57  Resp:  17  19  Temp:  98.1 F (36.7 C)  98.3 F (36.8 C)  TempSrc:  Oral  Oral  SpO2:  98%  99%  Weight:  74.3 kg    Height:      PainSc: 0-No pain  0-No pain     Intake/Output Summary (Last 24 hours) at 10/16/2019 1139 Last data filed at 10/16/2019 1048 Gross per 24 hour  Intake 420 ml  Output 750 ml  Net -330 ml   Filed Weights   10/14/19 0900 10/15/19 0430 10/16/19 0437  Weight: 76.4 kg 76.2 kg 74.3 kg   Weight change: -2.1 kg  Intake/Output from previous day: 09/20 0701 - 09/21 0700 In: 480  [P.O.:480] Out: 750 [Urine:750] Intake/Output this shift: Total I/O In: 180 [P.O.:180] Out: -   Examination:  General exam: AA,NAD, weak appearing. HEENT:Oral mucosa moist, Ear/Nose WNL grossly,dentition normal. Respiratory system: bilaterally clear, no wheezing or crackles,no use of accessory muscle, non tender. Cardiovascular system: S1 & S2 +, regular, No JVD. Gastrointestinal system: Abdomen soft, NT,ND, BS+. Nervous System: Alert awake but not following commands, able to move all his extremities but limited exam due to confusion versus not following instruction Extremities: No edema, distal peripheral pulses palpable.  Skin: No rashes,no icterus. MSK: Normal muscle bulk,tone,  power  Data Reviewed: I have personally reviewed following labs and imaging studies CBC: Recent Labs  Lab 10/11/19 1645 10/13/19 0600 10/14/19 1248  WBC 4.6 4.2 4.1  HGB 12.0* 12.1* 13.5  HCT 39.5 38.3* 43.6  MCV 89.4 85.5 87.4  PLT 210 208 244   Basic Metabolic Panel: Recent Labs  Lab 10/12/19 1130 10/13/19 0600 10/14/19 1248 10/15/19 0633 10/16/19 0435  NA 137 139 136 137 136  K 4.3 3.6 4.4 4.5 4.3  CL 107 106 106 107 103  CO2 20* 24 23 23 22   GLUCOSE 125* 84 102* 98 87  BUN 24* 18 16 18 17   CREATININE 0.93 1.13 1.13 1.28* 1.18  CALCIUM 8.4* 8.4* 8.7* 9.1 9.3  MG  --  1.5* 2.2  --   --   PHOS  --  3.6  --   --   --    GFR: Estimated Creatinine Clearance: 76.1 mL/min (by C-G formula based on SCr of 1.18 mg/dL). Liver Function Tests: Recent Labs  Lab 10/12/19 1130  AST 37  ALT 108*  ALKPHOS 147*  BILITOT 0.6  PROT 6.3*  ALBUMIN 3.3*   Recent Labs  Lab 10/12/19 1130  LIPASE 69*   No results for input(s): AMMONIA in the last 168 hours. Coagulation Profile: Recent Labs  Lab 10/12/19 1130  INR 1.2   Cardiac Enzymes: No results for input(s): CKTOTAL, CKMB, CKMBINDEX, TROPONINI in the last 168 hours. BNP (last 3 results) No results for input(s): PROBNP in the last  8760 hours. HbA1C: No results for input(s): HGBA1C in the last 72 hours. CBG: Recent Labs  Lab 10/15/19 1117 10/15/19 1608 10/15/19 2113 10/16/19 0632 10/16/19 1133  GLUCAP 162* 97 186* 92 135*   Lipid Profile: No results for input(s): CHOL, HDL, LDLCALC, TRIG, CHOLHDL, LDLDIRECT in the last 72 hours. Thyroid Function Tests: No results for input(s): TSH, T4TOTAL, FREET4, T3FREE, THYROIDAB in the last 72 hours. Anemia Panel: No results for input(s): VITAMINB12, FOLATE, FERRITIN, TIBC, IRON, RETICCTPCT in the last 72 hours. Sepsis Labs: No results for input(s): PROCALCITON, LATICACIDVEN in the last 168 hours.  Recent Results (from the past 240 hour(s))  SARS Coronavirus 2 by RT PCR (hospital order, performed in [facility] hospital lab) Nasopharyngeal Nasopharyngeal Swab     Status: None   Collection Time: 10/11/19  4:45 PM   Specimen: Nasopharyngeal Swab  Result Value Ref Range Status   SARS Coronavirus 2 NEGATIVE NEGATIVE Final    Comment: (NOTE) SARS-CoV-2 target nucleic acids are NOT DETECTED.  The SARS-CoV-2 RNA is generally detectable in upper and lower respiratory specimens during the acute phase of infection. The lowest concentration of SARS-CoV-2 viral copies this assay can detect is 250 copies / mL. A negative result does not preclude SARS-CoV-2 infection and should not be used as the sole basis for treatment or other patient management decisions.  A negative result may occur with improper specimen collection / handling, submission of specimen other than nasopharyngeal swab, presence of viral mutation(s) within the areas targeted by this assay, and inadequate number of viral copies (<250 copies / mL). A negative result must be combined with clinical observations, patient history, and epidemiological information.  Fact Sheet for Patients:   [facility]  Fact Sheet for Healthcare  Providers: 10/13/19  This test is not yet approved or  cleared by the [employer] FDA and has been authorized for detection and/or diagnosis of SARS-CoV-2 by FDA under an Emergency Use Authorization (EUA).  This EUA will remain in  effect (meaning this test can be used) for the duration of the COVID-19 declaration under Section 564(b)(1) of the Act, 21 U.S.C. section 360bbb-3(b)(1), unless the authorization is terminated or revoked sooner.  Performed at [facility], [address], [place], [place] [address]      Radiology Studies: CT HEAD WO CONTRAST  Result Date: 10/16/2019 CLINICAL DATA:  Mental status change. EXAM: CT HEAD WITHOUT CONTRAST TECHNIQUE: Contiguous axial images were obtained from the base of the skull through the vertex without intravenous contrast. COMPARISON:  10/04/2018 FINDINGS: Brain: Loss of gray-white differentiation and cytotoxic edema involving the posterior right frontal and parietal lobes (MCA territory). Mild mass effect, including sulcal effacement. No midline shift. Basal cisterns are patent. No mass occupying hemorrhagic transformation. No hydrocephalus. Partially empty sella. Vascular: Calcific intracranial atherosclerosis. Skull: Normal. Negative for fracture or focal lesion. Sinuses/Orbits: No acute finding. Other: No mastoid effusions. IMPRESSION: 1. Findings consistent with an acute/subacute right posterior MCA territory infarct. MRI could further characterize, if clinically indicated. 2. Mild local mass effect without midline shift. No mass occupying hemorrhagic transformation. These results will be called to the ordering clinician or representative by the Radiologist Assistant, and communication documented in the PACS or [employer]. Electronically Signed   By: Feliberto Harts MD   On: 10/16/2019 11:21     LOS: 4 days   Lanae Boast, MD [facility]  10/16/2019, 11:39 AM

## 2019-10-16 NOTE — Progress Notes (Signed)
Report called to nurse on 3 west

## 2019-10-16 NOTE — Progress Notes (Signed)
MRI/MRA completed and images reviewed. I agree with the Radiology official report findings as follows:  MRI HEAD IMPRESSION: 1. Limited exam due to the patient's inability to tolerate the full length of the study. Diffusion-weighted imaging only was performed. 2. Moderate-sized acute ischemic infarct involving the right frontoparietal region, posterior right MCA distribution. No associated mass effect. 3. No other definite acute intracranial abnormality.  MRA HEAD IMPRESSION: Negative intracranial MRA. No large vessel occlusion or hemodynamically significant stenosis. No aneurysm.  Electronically signed: Dr. Caryl Pina

## 2019-10-16 NOTE — Evaluation (Signed)
Clinical/Bedside Swallow Evaluation Patient Details  Name: Kerry Hamilton MRN: [id number] Date of Birth: [birth year]  Today's Date: 10/16/2019 Time: SLP Start Time (ACUTE ONLY): 1210 SLP Stop Time (ACUTE ONLY): 1230 SLP Time Calculation (min) (ACUTE ONLY): 20 min  Past Medical History:  Past Medical History:  Diagnosis Date   Cocaine abuse (HCC)    Depression    Diabetes mellitus without complication (HCC)    Systolic congestive heart failure (HCC)    Tobacco abuse    Past Surgical History:  Past Surgical History:  Procedure Laterality Date   RIGHT/LEFT HEART CATH AND CORONARY ANGIOGRAPHY N/A 06/13/2018   Procedure: RIGHT/LEFT HEART CATH AND CORONARY ANGIOGRAPHY;  Surgeon: Hamilton, Kerry M, MD;  Location: [facility] INVASIVE CV LAB;  Service: Cardiovascular;  Laterality: N/A;   HPI:  54 year old man with EF 15%, DM 2 was admitted yesterday with shortness of breath and chest pain.  She had been drinking a lot and using crack for the last week.  Ultimately diagnosed with right MCA CVA.    Assessment / Plan / Recommendation Clinical Impression  Patient presents with a functional oropharyngeal swallow without seemingly normal oral and pharyngeal phases and no overt s/s of aspiration. Exam difficult however as patient with severe cognitive-communication deficits that will require further diagnostics as patient also appears to be having impairments in the areas of vision and hearing. Unable to follow directions, poor focused attention to clincian or RN however once meal tray placed in front of him, patient able to self feed and consume entire tray without indication of swallowing difficulty. No SLP f/u indicated for swallowing however will f/u for cognitive-linguistic evaluation.  SLP Visit Diagnosis: Dysphagia, unspecified (R13.10)    Aspiration Risk  Mild aspiration risk    Diet Recommendation Regular;Thin liquid   Liquid Administration via: Cup;Straw Medication Administration: Whole meds with  liquid Supervision: Patient able to self feed Compensations: Slow rate;Small sips/bites Postural Changes: Seated upright at 90 degrees    Other  Recommendations Oral Care Recommendations: Oral care BID     Swallow Study   General HPI: 54 year old man with EF 15%, DM 2 was admitted yesterday with shortness of breath and chest pain.  She had been drinking a lot and using crack for the last week.  Ultimately diagnosed with right MCA CVA.  Type of Study: Bedside Swallow Evaluation Previous Swallow Assessment: none Diet Prior to this Study: NPO Temperature Spikes Noted: No Respiratory Status: Room air History of Recent Intubation: No Behavior/Cognition: Confused Oral Cavity Assessment: Within Functional Limits Oral Care Completed by SLP: No (patient not agreeable) Oral Cavity - Dentition: Adequate natural dentition Vision: Functional for self-feeding (although appears to be disordered) Self-Feeding Abilities: Able to feed self Patient Positioning: Upright in bed Baseline Vocal Quality: Normal Volitional Cough: Cognitively unable to elicit Volitional Swallow: Unable to elicit    Oral/Motor/Sensory Function Overall Oral Motor/Sensory Function: Within functional limits   Ice Chips Ice chips: Not tested   Thin Liquid Thin Liquid: Within functional limits Presentation: Cup;Self Fed;Straw    Nectar Thick Nectar Thick Liquid: Not tested   Honey Thick Honey Thick Liquid: Not tested   Puree Puree: Not tested   Solid     Solid: Within functional limits Presentation: Self Fed     Kerry Savarese MA, CCC-SLP   Kerry Hamilton 10/16/2019,12:45 PM

## 2019-10-16 NOTE — Progress Notes (Signed)
Received a call from [facility] about pt's CT results. Paged provider to inform of CT resulting. Pt still confused and keeps saying "stroke, stroke, stroke". Provider made aware. Will continue to monitor pt.

## 2019-10-17 ENCOUNTER — Inpatient Hospital Stay (HOSPITAL_COMMUNITY): Payer: Medicaid Other

## 2019-10-17 DIAGNOSIS — I6340 Cerebral infarction due to embolism of unspecified cerebral artery: Secondary | ICD-10-CM | POA: Insufficient documentation

## 2019-10-17 DIAGNOSIS — I639 Cerebral infarction, unspecified: Secondary | ICD-10-CM | POA: Diagnosis not present

## 2019-10-17 DIAGNOSIS — I5023 Acute on chronic systolic (congestive) heart failure: Secondary | ICD-10-CM | POA: Diagnosis not present

## 2019-10-17 DIAGNOSIS — I63411 Cerebral infarction due to embolism of right middle cerebral artery: Secondary | ICD-10-CM | POA: Diagnosis not present

## 2019-10-17 LAB — GLUCOSE, CAPILLARY
Glucose-Capillary: 100 mg/dL — ABNORMAL HIGH (ref 70–99)
Glucose-Capillary: 206 mg/dL — ABNORMAL HIGH (ref 70–99)
Glucose-Capillary: 137 mg/dL — ABNORMAL HIGH (ref 70–99)
Glucose-Capillary: 163 mg/dL — ABNORMAL HIGH (ref 70–99)
Glucose-Capillary: 111 mg/dL — ABNORMAL HIGH (ref 70–99)

## 2019-10-17 LAB — CBC
HCT: 49.5 % (ref 39.0–52.0)
Hemoglobin: 15.9 g/dL (ref 13.0–17.0)
MCH: 27.9 pg (ref 26.0–34.0)
MCHC: 32.1 g/dL (ref 30.0–36.0)
MCV: 87 fL (ref 80.0–100.0)
Platelets: 224 10*3/uL (ref 150–400)
RBC: 5.69 MIL/uL (ref 4.22–5.81)
RDW: 13.2 % (ref 11.5–15.5)
WBC: 4.3 10*3/uL (ref 4.0–10.5)
nRBC: 0 % (ref 0.0–0.2)

## 2019-10-17 LAB — COMPREHENSIVE METABOLIC PANEL
ALT: 51 U/L — ABNORMAL HIGH (ref 0–44)
AST: 31 U/L (ref 15–41)
Albumin: 3.5 g/dL (ref 3.5–5.0)
Alkaline Phosphatase: 111 U/L (ref 38–126)
Anion gap: 10 (ref 5–15)
BUN: 20 mg/dL (ref 6–20)
CO2: 23 mmol/L (ref 22–32)
Calcium: 9.5 mg/dL (ref 8.9–10.3)
Chloride: 102 mmol/L (ref 98–111)
Creatinine, Ser: 1.15 mg/dL (ref 0.61–1.24)
GFR calc Af Amer: >60 mL/min (ref >60)
GFR calc non Af Amer: >60 mL/min (ref >60)
Glucose, Bld: 104 mg/dL — ABNORMAL HIGH (ref 70–99)
Potassium: 4.4 mmol/L (ref 3.5–5.1)
Sodium: 135 mmol/L (ref 135–145)
Total Bilirubin: 0.5 mg/dL (ref 0.3–1.2)
Total Protein: 7.3 g/dL (ref 6.5–8.1)

## 2019-10-17 LAB — LIPID PANEL
Cholesterol: 170 mg/dL (ref 0–200)
HDL: 50 mg/dL (ref >40)
LDL Cholesterol: 102 mg/dL — ABNORMAL HIGH (ref 0–99)
Total CHOL/HDL Ratio: 3.4 RATIO
Triglycerides: 88 mg/dL (ref <150)
VLDL: 18 mg/dL (ref 0–40)

## 2019-10-17 LAB — HEPATITIS PANEL, ACUTE
HCV Ab: NONREACTIVE
Hep A IgM: NONREACTIVE
Hep B C IgM: NONREACTIVE
Hepatitis B Surface Ag: NONREACTIVE

## 2019-10-17 LAB — HEMOGLOBIN A1C
Hgb A1c MFr Bld: 6.3 % — ABNORMAL HIGH (ref 4.8–5.6)
Mean Plasma Glucose: 134.11 mg/dL

## 2019-10-17 MED ORDER — ATORVASTATIN CALCIUM 80 MG PO TABS: 80.0000 mg | ORAL_TABLET | Freq: Every day | ORAL | Status: DC

## 2019-10-17 MED ORDER — ATORVASTATIN CALCIUM 40 MG PO TABS
40.0000 mg | ORAL_TABLET | Freq: Every day | ORAL | Status: DC
  Administered 2019-10-17 – 2019-11-01 (×15): 40 mg via ORAL
  Filled 2019-10-17 (×16): qty 1

## 2019-10-17 NOTE — Progress Notes (Signed)
Carotid duplex bilateral study completed.   Please see CV Proc for preliminary results.   Ellouise Mcwhirter, RDMS  

## 2019-10-17 NOTE — Progress Notes (Signed)
STROKE TEAM PROGRESS NOTE   INTERVAL HISTORY Sitter at bedside.  Patient lying in bed, initially talkative, able to say "it is too noisy ... hospital" "I ... stroke" with word of any difficulty and paraphasic errors.  However, as the encounter goes, he became nonverbal and not answer questions or following commands.  Still has left upper extremity flaccid, however able to move both lower extremities seems left weaker than right.  MRI showed moderate large right MCA infarct.  Vitals:   10/17/19 0500 10/17/19 0808 10/17/19 1154 10/17/19 1544  BP:  107/73 102/69 115/68  Pulse:  75 (!) 59 (!) 53  Resp:    18  Temp:  (!) 97.5 F (36.4 C) 98.2 F (36.8 C) 98.1 F (36.7 C)  TempSrc:  Axillary Oral Oral  SpO2:  98% 99% 99%  Weight: 74.5 kg     Height:       CBC:  Recent Labs  Lab 10/14/19 1248 10/17/19 0435  WBC 4.1 4.3  HGB 13.5 15.9  HCT 43.6 49.5  MCV 87.4 87.0  PLT 244 224   Basic Metabolic Panel:  Recent Labs  Lab 10/13/19 0600 10/13/19 0600 10/14/19 1248 10/15/19 0633 10/16/19 0435 10/17/19 0435  NA 139   < > 136   < > 136 135  K 3.6   < > 4.4   < > 4.3 4.4  CL 106   < > 106   < > 103 102  CO2 24   < > 23   < > 22 23  GLUCOSE 84   < > 102*   < > 87 104*  BUN 18   < > 16   < > 17 20  CREATININE 1.13   < > 1.13   < > 1.18 1.15  CALCIUM 8.4*   < > 8.7*   < > 9.3 9.5  MG 1.5*  --  2.2  --   --   --   PHOS 3.6  --   --   --   --   --    < > = values in this interval not displayed.   Lipid Panel:  Recent Labs  Lab 10/17/19 0435  CHOL 170  TRIG 88  HDL 50  CHOLHDL 3.4  VLDL 18  LDLCALC 973*   HgbA1c:  Recent Labs  Lab 10/17/19 0435  HGBA1C 6.3*   Urine Drug Screen:  Recent Labs  Lab 10/12/19 1955  LABOPIA NONE DETECTED  COCAINSCRNUR NONE DETECTED  LABBENZ NONE DETECTED  AMPHETMU NONE DETECTED  THCU NONE DETECTED  LABBARB NONE DETECTED    Alcohol Level No results for input(s): ETH in the last 168 hours.  IMAGING past 24 hours MR ANGIO HEAD WO  CONTRAST  Result Date: 10/16/2019 CLINICAL DATA:  Initial evaluation for neuro deficit, stroke suspected. EXAM: MRI HEAD WITHOUT CONTRAST MRA HEAD WITHOUT CONTRAST TECHNIQUE: Multiplanar, multiecho pulse sequences of the brain and surrounding structures were obtained without intravenous contrast. Angiographic images of the head were obtained using MRA technique without contrast. COMPARISON:  Prior CT from earlier the same day. FINDINGS: MRI HEAD FINDINGS Brain: Examination is markedly limited as the patient was unable to tolerate the full length of the study. Axial and coronal DWI sequences only were performed. Diffusion-weighted sequence demonstrates a confluent area of restricted diffusion involving the posterior right frontoparietal region, consistent with an acute posterior right MCA territory infarct. Mild involvement of the posterior right insula, with extension to involve the right periatrial white matter. No  significant regional mass effect. No obvious evidence for associated hemorrhage, although evaluation for this is markedly limited on this exam. No other diffusion abnormality to suggest acute or subacute ischemia seen elsewhere within the brain. Gray-white matter differentiation otherwise maintained. No other mass effect or midline shift. No obvious mass lesion. Ventricles normal size without hydrocephalus. No visible extra-axial fluid collection. Vascular: Not assessed on this limited exam. Skull and upper cervical spine: Not assessed on this limited exam. Sinuses/Orbits: Not assessed on this limited exam. Other: None. MRA HEAD FINDINGS ANTERIOR CIRCULATION: Visualized distal cervical segments of the internal carotid arteries widely patent with symmetric antegrade flow. Petrous, cavernous, and supraclinoid segments widely patent without stenosis or other abnormality. A1 segments patent bilaterally. Normal anterior communicating artery complex. Anterior cerebral arteries widely patent to their distal  aspects without stenosis. No M1 stenosis or occlusion. Left M1 bifurcates early. Bifurcations M cells are within normal limits. No visible proximal MCA branch occlusion. POSTERIOR CIRCULATION: Both vertebral arteries are widely patent to the vertebrobasilar junction without stenosis. Left vertebral artery slightly dominant. Neither PICA origin visualized. Basilar widely patent to its distal aspect without stenosis. Superior cerebral arteries patent bilaterally. Both PCAs primarily supplied via the basilar and are well perfused to their distal aspects. Small bilateral posterior communicating arteries noted. No intracranial aneurysm or other vascular abnormality. IMPRESSION: MRI HEAD IMPRESSION: 1. Limited exam due to the patient's inability to tolerate the full length of the study. Diffusion-weighted imaging only was performed. 2. Moderate-sized acute ischemic infarct involving the right frontoparietal region, posterior right MCA distribution. No associated mass effect. 3. No other definite acute intracranial abnormality. MRA HEAD IMPRESSION: Negative intracranial MRA. No large vessel occlusion or hemodynamically significant stenosis. No aneurysm. Electronically Signed   By: Rise Mu M.D.   On: 10/16/2019 19:02   MR BRAIN WO CONTRAST  Result Date: 10/16/2019 CLINICAL DATA:  Initial evaluation for neuro deficit, stroke suspected. EXAM: MRI HEAD WITHOUT CONTRAST MRA HEAD WITHOUT CONTRAST TECHNIQUE: Multiplanar, multiecho pulse sequences of the brain and surrounding structures were obtained without intravenous contrast. Angiographic images of the head were obtained using MRA technique without contrast. COMPARISON:  Prior CT from earlier the same day. FINDINGS: MRI HEAD FINDINGS Brain: Examination is markedly limited as the patient was unable to tolerate the full length of the study. Axial and coronal DWI sequences only were performed. Diffusion-weighted sequence demonstrates a confluent area of  restricted diffusion involving the posterior right frontoparietal region, consistent with an acute posterior right MCA territory infarct. Mild involvement of the posterior right insula, with extension to involve the right periatrial white matter. No significant regional mass effect. No obvious evidence for associated hemorrhage, although evaluation for this is markedly limited on this exam. No other diffusion abnormality to suggest acute or subacute ischemia seen elsewhere within the brain. Gray-white matter differentiation otherwise maintained. No other mass effect or midline shift. No obvious mass lesion. Ventricles normal size without hydrocephalus. No visible extra-axial fluid collection. Vascular: Not assessed on this limited exam. Skull and upper cervical spine: Not assessed on this limited exam. Sinuses/Orbits: Not assessed on this limited exam. Other: None. MRA HEAD FINDINGS ANTERIOR CIRCULATION: Visualized distal cervical segments of the internal carotid arteries widely patent with symmetric antegrade flow. Petrous, cavernous, and supraclinoid segments widely patent without stenosis or other abnormality. A1 segments patent bilaterally. Normal anterior communicating artery complex. Anterior cerebral arteries widely patent to their distal aspects without stenosis. No M1 stenosis or occlusion. Left M1 bifurcates early. Bifurcations M cells are within  normal limits. No visible proximal MCA branch occlusion. POSTERIOR CIRCULATION: Both vertebral arteries are widely patent to the vertebrobasilar junction without stenosis. Left vertebral artery slightly dominant. Neither PICA origin visualized. Basilar widely patent to its distal aspect without stenosis. Superior cerebral arteries patent bilaterally. Both PCAs primarily supplied via the basilar and are well perfused to their distal aspects. Small bilateral posterior communicating arteries noted. No intracranial aneurysm or other vascular abnormality. IMPRESSION:  MRI HEAD IMPRESSION: 1. Limited exam due to the patient's inability to tolerate the full length of the study. Diffusion-weighted imaging only was performed. 2. Moderate-sized acute ischemic infarct involving the right frontoparietal region, posterior right MCA distribution. No associated mass effect. 3. No other definite acute intracranial abnormality. MRA HEAD IMPRESSION: Negative intracranial MRA. No large vessel occlusion or hemodynamically significant stenosis. No aneurysm. Electronically Signed   By: Rise Mu M.D.   On: 10/16/2019 19:02   VAS US CAROTID (at [facility] and [facility] only)  Result Date: 10/17/2019 Carotid Arterial Duplex Study Indications:       CVA. Risk Factors:      Current smoker. Comparison Study:  No prior studies. Performing Technologist: Jean Rosenthal  Examination Guidelines: A complete evaluation includes B-mode imaging, spectral Doppler, color Doppler, and power Doppler as needed of all accessible portions of each vessel. Bilateral testing is considered an integral part of a complete examination. Limited examinations for reoccurring indications may be performed as noted.  Right Carotid Findings: +----------+--------+--------+--------+------------------+------------------+           PSV cm/sEDV cm/sStenosisPlaque DescriptionComments           +----------+--------+--------+--------+------------------+------------------+ CCA Prox  146     26                                                   +----------+--------+--------+--------+------------------+------------------+ CCA Distal148     25                                intimal thickening +----------+--------+--------+--------+------------------+------------------+ ICA Prox  152     18      1-39%   heterogenous                         +----------+--------+--------+--------+------------------+------------------+ ICA Distal85      23                                                    +----------+--------+--------+--------+------------------+------------------+ ECA       139     19                                                   +----------+--------+--------+--------+------------------+------------------+ +----------+--------+-------+----------------+-------------------+           PSV cm/sEDV cmsDescribe        Arm Pressure (mmHG) +----------+--------+-------+----------------+-------------------+ [id number]            Multiphasic, WNL                    +----------+--------+-------+----------------+-------------------+ +---------+--------+--+--------+--+---------+  VertebralPSV cm/s53EDV cm/s12Antegrade +---------+--------+--+--------+--+---------+  Left Carotid Findings: +----------+--------+--------+--------+------------------+------------------+           PSV cm/sEDV cm/sStenosisPlaque DescriptionComments           +----------+--------+--------+--------+------------------+------------------+ CCA Prox  169     28                                intimal thickening +----------+--------+--------+--------+------------------+------------------+ CCA Distal122     21                                intimal thickening +----------+--------+--------+--------+------------------+------------------+ ICA Prox  48      16      1-39%   heterogenous                         +----------+--------+--------+--------+------------------+------------------+ ICA Distal112     29                                                   +----------+--------+--------+--------+------------------+------------------+ ECA       143     18                                                   +----------+--------+--------+--------+------------------+------------------+ +----------+--------+--------+----------------+-------------------+           PSV cm/sEDV cm/sDescribe        Arm Pressure (mmHG) +----------+--------+--------+----------------+-------------------+  [id number]             Multiphasic, WNL                    +----------+--------+--------+----------------+-------------------+ +---------+--------+--+--------+--+---------+ VertebralPSV cm/s63EDV cm/s11Antegrade +---------+--------+--+--------+--+---------+   Summary: Right Carotid: Velocities in the right ICA are consistent with a 1-39% stenosis. Left Carotid: Velocities in the left ICA are consistent with a 1-39% stenosis. Vertebrals:  Bilateral vertebral arteries demonstrate antegrade flow. Subclavians: Normal flow hemodynamics were seen in bilateral subclavian              arteries. *See table(s) above for measurements and observations.     Preliminary     PHYSICAL EXAM  Temp:  [97.5 F (36.4 C)-98.8 F (37.1 C)] 98.1 F (36.7 C) (09/22 1544) Pulse Rate:  [52-75] 53 (09/22 1544) Resp:  [16-18] 18 (09/22 1544) BP: (102-129)/(64-87) 115/68 (09/22 1544) SpO2:  [98 %-100 %] 99 % (09/22 1544) Weight:  [74.5 kg] 74.5 kg (09/22 0500)  General - Well nourished, well developed, in no apparent distress.  Ophthalmologic - fundi not visualized due to noncooperation.  Cardiovascular - Regular rhythm and rate.  Neuro - awake alert, initially talkative, able to say "it is too noisy ... hospital" "I ... stroke" with word of any difficulty and paraphasic errors. However, later became nonverbal, not answer questions. Not following simple commands. Not name or repeat. With prompt, able to have bilateral gaze, PERRL, not consistent blinking to visual threat bilaterally. Mild left facial droop. RUE at least 4/5. Left UE flaccid. BLEs withdraw to pain. RLE stronger than left. No babinski. Sensation, coordination not corporative and gait not tested.   ASSESSMENT/PLAN [name] is a  54 y.o. male with history of  depression, DM2, CHF with EF of 15%, EtOH use, cocaine use who is admitted 10/12/2019 with SOB and chest pain. On Ativan PRN per CIWA protocol. On 10/16/2019 noted to have difficulty  with language (garbled speech and not following commands). A CTH showed an acute/subacute right posterior MCA territory infarct.   Stroke:   R MCA infarct embolic most likely secondary to cardiomyopathy w/ low EF in setting of alcohol and cocaine use  CT head acute/subacute R poster MCA infarct w/ mild local mass effect.  MRI  Moderate R frontoparietal, posterior R MCA infarct  MRA  Unremarkable   Carotid Doppler  B ICA 1-39% stenosis, VAs antegrade   2D Echo EF 15-20%. No source of embolus. LA severely dilated  LDL 102  HgbA1c 6.3  VTE prophylaxis - Lovenox 40 mg sq daily   No antithrombotic prior to admission, now on aspirin 81 mg daily. From neuro standpoint, recommend anticoagulation in 7-10 days given severe cardiomyopathy with stroke.   Therapy recommendations:  CIR  Disposition:  pending   Cardiomyopathy  Acute on chronic systolic Congestive heart failure  Admitted for short of breath and chest pain  Elevated troponin  2D Echo EF 15-20%.   Could related to alcohol and cocaine  Likely the source of current stroke  now on aspirin 81 mg daily.   Recommend anticoagulation in 7-10 days given severe cardiomyopathy with stroke.  AC can be discontinued once EF > 30%-35%.   Alcohol withdrawal  On CIWA protocol  Has sitter at bedside  advised to drink no more than 2 drink(s) a day from stroke perspective  Tobacco abuse  Current smoker  Smoking cessation counseling provided  Pt is willing to quit  Hypertension  Stable . Permissive hypertension (OK if < 220/120) but gradually normalize in 5-7 days . Long-term BP goal normotensive  Hyperlipidemia  Home meds:  No statin  Now on lipitor 40  LDL 102, goal < 70  AST/ALT 31/51 - monitor LFT as outpt  Continue statin at discharge  Diabetes type II Controlled  HgbA1c 6.3, goal < 7.0  CBGs  SSI  PCP follow-up  Other Stroke Risk Factors    Other Active Problems  Acute encephalopathy,  possible DTs, Wernicke's given alcohol abuse  Mild transaminitis, improved  Hospital day # 5  Marvel Plan, MD PhD Stroke Neurology 10/17/2019 5:28 PM   To contact Stroke Continuity provider, please refer to [email or web address]. After hours, contact General Neurology

## 2019-10-17 NOTE — Evaluation (Signed)
Occupational Therapy Evaluation Patient Details Name: Kerry Hamilton MRN: [id number] DOB: April 14, 1965 Today's Date: 10/17/2019    History of Present Illness Pt is a 54 y.o. male admitted 10/11/19 with SOB and chest pain; chart states pt with ETOH and cocaine use for the past week. Workup for acute on chronic CHF secondary to nonischemic cardiomyopathy, suspect ETOH abuse. Worsening confusion noted 9/20-9/21; MRI with acute, moderate-sized infarct in R frontoparietal region. PMH includes substance abuse, DM2, CAD, depression.   Clinical Impression   PT admitted with CVA. Pt currently with functional limitiations due to the deficits listed below (see OT problem list). Pt with expressive language deficits, cognitive deficits and balance deficits affecting all adls.  Pt will benefit from skilled OT to increase their independence and safety with adls and balance to allow discharge CIR.  Pt requires total +2 mod (A) for basic transfer. Pt prefers to watch channel 13 HLN and likes sweets as a motivator.      Follow Up Recommendations  CIR    Equipment Recommendations  None recommended by OT    Recommendations for Other Services Rehab consult     Precautions / Restrictions Precautions Precautions: Fall Restrictions Weight Bearing Restrictions: No      Mobility Bed Mobility Overal bed mobility: Needs Assistance Bed Mobility: Supine to Sit;Sit to Supine     Supine to sit: Min assist;HOB elevated Sit to supine: Min assist;HOB elevated   General bed mobility comments: pt needs encouragement to get oob and immediately wants back in the bed. posey in place at [facility] stime  Transfers Overall transfer level: Needs assistance Equipment used: 2 person hand held assist Transfers: Sit to/from Stand Sit to Stand: Mod assist;+2 physical assistance;+2 safety/equipment         General transfer comment: ModA+2 to initiate standing and maintain stability; pt not following verbal commands  consistently    Balance Overall balance assessment: Needs assistance   Sitting balance-Leahy Scale: Fair       Standing balance-Leahy Scale: Poor Standing balance comment: Reliant on HHA or external assist to maintain static standing                           ADL either performed or assessed with clinical judgement   ADL Overall ADL's : Needs assistance/impaired Eating/Feeding: Set up;Bed level Eating/Feeding Details (indicate cue type and reason): pt offered drink but ignores OT and searches for containers shaking them. When OT places back on tray and pt and searches to find it without (A) accepts the drink and drinks it                  Lower Body Dressing: Total assistance   Toilet Transfer: +2 for physical assistance;Moderate assistance             General ADL Comments: pt fixated on eating on arrival and used this to motivate pt to move. pt states "give me my cracker" pt moves toward crackers and static stands. pt then starts waving arm in the air without purpose. pt accepts urinal from OT and made a voice request of bottle. pt needs mod (A) to correct position. pt attempting to place top on urinal multiple times without top present. pt states "no top> pt asking for "HLN" for tv change. pt repeating. Ot states Its on HLN i found it. pt ignores comment pt tilting head and sees tV yells STOP Thats it thats it. Ot was not holding remote and task has  been expressed as finding the channel  prior to this.      Vision   Vision Assessment?: Vision impaired- to be further tested in functional context Additional Comments: pt tilting head and keeping L eye slightly closed. pt with R eye dominance noticed     Perception     Praxis      Pertinent Vitals/Pain Pain Assessment: No/denies pain Faces Pain Scale: No hurt Pain Intervention(s): Monitored during session     Hand Dominance Right   Extremity/Trunk Assessment Upper Extremity Assessment Upper Extremity  Assessment: Overall WFL for tasks assessed   Lower Extremity Assessment Lower Extremity Assessment: Defer to PT evaluation   Cervical / Trunk Assessment Cervical / Trunk Assessment: Normal   Communication Communication Communication: Expressive difficulties;Receptive difficulties (baseline stutter per tech in room from family report)   Cognition Arousal/Alertness: Awake/alert Behavior During Therapy: Restless;Flat affect Overall Cognitive Status: Difficult to assess                                 General Comments: Pt with minimal interaction with therapists this session; not answering questions and inconsistently forming sentences; stuttering speech at times, but presents more as difficulty word-finding; difficult to determine true cognitive impairment (receptive/expressive difficulties?) vs. desire to interact with PT/OT   General Comments       Exercises     Shoulder Instructions      Home Living Family/patient expects to be discharged to:: Inpatient rehab                                 Additional Comments: Pt poor historian, not answering questions regarding home set-up or PLOF      Prior Functioning/Environment Level of Independence: Independent        Comments: Pt poor historian, not answering questions regarding home set-up or PLOF. Per chart, was appropriately conversive upon admission regarding chest pain; sister reports h/o "stuttering"        OT Problem List: Decreased strength;Decreased activity tolerance;Impaired balance (sitting and/or standing);Impaired vision/perception;Decreased cognition;Decreased safety awareness;Decreased knowledge of precautions;Decreased knowledge of use of DME or AE      OT Treatment/Interventions: Self-care/ADL training;Therapeutic exercise;Neuromuscular education;Energy conservation;DME and/or AE instruction;Manual therapy;Therapeutic activities;Patient/family education;Balance training;Cognitive  remediation/compensation;Visual/perceptual remediation/compensation    OT Goals(Current goals can be found in the care plan section) Acute Rehab OT Goals Patient Stated Goal: "I gotta have my sweet" OT Goal Formulation: Patient unable to participate in goal setting Time For Goal Achievement: 10/31/19 Potential to Achieve Goals: Good  OT Frequency: Min 2X/week   Barriers to D/C:            Co-evaluation PT/OT/SLP Co-Evaluation/Treatment: Yes Reason for Co-Treatment: For patient/therapist safety;To address functional/ADL transfers;Necessary to address cognition/behavior during functional activity PT goals addressed during session: Mobility/safety with mobility;Balance OT goals addressed during session: ADL's and self-care;Proper use of Adaptive equipment and DME;Strengthening/ROM      AM-PAC OT "6 Clicks" Daily Activity     Outcome Measure Help from another person eating meals?: A Little Help from another person taking care of personal grooming?: A Little Help from another person toileting, which includes using toliet, bedpan, or urinal?: A Lot Help from another person bathing (including washing, rinsing, drying)?: A Lot Help from another person to put on and taking off regular upper body clothing?: A Little Help from another person to put on and taking off regular  lower body clothing?: A Lot 6 Click Score: 15   End of Session Nurse Communication: Mobility status;Weight bearing status  Activity Tolerance: Patient tolerated treatment well Patient left: in bed;with call bell/phone within reach;with bed alarm set;with nursing/sitter in room;with restraints reapplied (posey)  OT Visit Diagnosis: Unsteadiness on feet (R26.81);Muscle weakness (generalized) (M62.81)                Time: 1443-1540 OT Time Calculation (min): 20 min Charges:  OT General Charges $OT Visit: 1 Visit OT Evaluation $OT Eval Moderate Complexity: 1 Mod   Brynn, OTR/L  Acute Rehabilitation Services Pager:  [phone] Office: [phone] .   Mateo Flow 10/17/2019, 4:16 PM

## 2019-10-17 NOTE — Evaluation (Signed)
Speech Language Pathology Evaluation Patient Details Name: Kerry Hamilton MRN: [id number] DOB: 02-Sep-1965 Today's Date: 10/17/2019 Time: 0973-5329 SLP Time Calculation (min) (ACUTE ONLY): 21 min  Problem List:  Patient Active Problem List   Diagnosis Date Noted  . Cerebral embolism with cerebral infarction 10/17/2019  . Schizoaffective disorder (HCC) 10/05/2018  . Acute CHF (congestive heart failure) (HCC) 08/01/2018  . Elevated liver enzymes 08/01/2018  . Acute on chronic systolic CHF (congestive heart failure) (HCC) 07/13/2018  . ARF (acute renal failure) (HCC) 07/13/2018  . Acute systolic CHF (congestive heart failure) (HCC)   . Elevated troponin   . Shortness of breath   . LFT elevation   . Prolonged QT interval   . Polysubstance abuse (HCC)   . ACS (acute coronary syndrome) (HCC) 06/10/2018  . Diabetes mellitus type 2, noninsulin dependent (HCC) 06/10/2018  . Depression 06/10/2018   Past Medical History:  Past Medical History:  Diagnosis Date  . Cocaine abuse (HCC)   . Depression   . Diabetes mellitus without complication (HCC)   . Systolic congestive heart failure (HCC)   . Tobacco abuse    Past Surgical History:  Past Surgical History:  Procedure Laterality Date  . RIGHT/LEFT HEART CATH AND CORONARY ANGIOGRAPHY N/A 06/13/2018   Procedure: RIGHT/LEFT HEART CATH AND CORONARY ANGIOGRAPHY;  Surgeon: [place], Peter M, MD;  Location: [facility] INVASIVE CV LAB;  Service: Cardiovascular;  Laterality: N/A;   HPI:  Pt is a 54 y.o. male admitted 10/11/19 with SOB and chest pain; chart states pt with ETOH and cocaine use for the past week. Workup for acute on chronic CHF secondary to nonischemic cardiomyopathy, suspect ETOH abuse. Worsening confusion noted 9/20-9/21; MRI with acute, moderate-sized infarct in R frontoparietal region. PMH includes substance abuse, DM2, CAD, depression.   Assessment / Plan / Recommendation Clinical Impression  Pt was seen for speech/language/cognition  evaluation. He did not provide any information regarding his history and required some persistence for participation in the evaluation. 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 Word and phrase repetitions were noted during communication which is likely his baseline. However, he also demonstrated symptoms of aphasia characterized by impaired reading comprehension and word retrieval difficulty during sentence formulation. Pt exhibited paraphasias (e.g., tober for table) but inconsistent error patterns were also noted. Pt did not follow any commands or respond appropriately to any of the SLP's questions. The impact of his willingness to perform these tasks is considered; however, due to the nature of some of his responses, impaired comprehension is questioned. Skilled SLP services are clinically indicated at this time to target language and further investigate motor speech function.    SLP Assessment  SLP Recommendation/Assessment: Patient needs continued Speech Lanaguage Pathology Services SLP Visit Diagnosis: Aphasia (R47.01)    Follow Up Recommendations  Inpatient Rehab    Frequency and Duration min 2x/week  2 weeks      SLP Evaluation Cognition  Overall Cognitive Status: No family/caregiver present to determine baseline cognitive functioning Orientation Level: Disoriented X4       Comprehension  Auditory Comprehension Overall Auditory Comprehension: Impaired (Difficult to assess) Yes/No Questions: Impaired Basic Immediate Environment Questions: 0-24% accurate Commands: Impaired One Step Basic Commands: 0-24% accurate Conversation: Simple Reading Comprehension Reading Status: Impaired Word level: Impaired    Expression Expression Primary Mode of Expression: Verbal Verbal Expression Overall Verbal Expression: Impaired Initiation: Impaired Level of Generative/Spontaneous Verbalization: Sentence Pragmatics: Impairment Impairments: Topic  maintenance;Eye contact;Abnormal affect Written Expression Dominant Hand: Right  Oral / Motor  Oral Motor/Sensory Function Overall Oral Motor/Sensory Function: Within functional limits Motor Speech Overall Motor Speech: Appears within functional limits for tasks assessed Respiration: Within functional limits Phonation: Normal Articulation: Within functional limitis Intelligibility: Intelligible   Kerry Hamilton I. Kerry Clock, Kerry Hamilton, Kerry Hamilton Acute Rehabilitation Services Office number [phone] Pager [phone]                    Kerry Hamilton 10/17/2019, 4:56 PM

## 2019-10-17 NOTE — Progress Notes (Signed)
STROKE TEAM PROGRESS NOTE   INTERVAL HISTORY Sitter at bedside. Pt able to say "weak" "blurry" to me in the beginning, but then non verbal even with prompt and encouragement. Dr. Gertie Exon from CIR came by at the end of encounter, pt made some sound but not words. Neuro stable, still has left UE pronator drift.   Vitals:   10/17/19 2101 10/18/19 0007 10/18/19 0351 10/18/19 0851  BP: 104/70 102/75 108/70 104/77  Pulse: (!) 53 67 (!) 58 61  Resp: 19 18 17 16   Temp: 98.2 F (36.8 C) 97.7 F (36.5 C) 97.7 F (36.5 C) (!) 97.5 F (36.4 C)  TempSrc: Oral  Oral Oral  SpO2: 99% 99% 96% 100%  Weight:      Height:       CBC:  Recent Labs  Lab 10/14/19 1248 10/17/19 0435  WBC 4.1 4.3  HGB 13.5 15.9  HCT 43.6 49.5  MCV 87.4 87.0  PLT 244 224   Basic Metabolic Panel:  Recent Labs  Lab 10/13/19 0600 10/13/19 0600 10/14/19 1248 10/15/19 0633 10/16/19 0435 10/17/19 0435  NA 139   < > 136   < > 136 135  K 3.6   < > 4.4   < > 4.3 4.4  CL 106   < > 106   < > 103 102  CO2 24   < > 23   < > 22 23  GLUCOSE 84   < > 102*   < > 87 104*  BUN 18   < > 16   < > 17 20  CREATININE 1.13   < > 1.13   < > 1.18 1.15  CALCIUM 8.4*   < > 8.7*   < > 9.3 9.5  MG 1.5*  --  2.2  --   --   --   PHOS 3.6  --   --   --   --   --    < > = values in this interval not displayed.   Lipid Panel:  Recent Labs  Lab 10/17/19 0435  CHOL 170  TRIG 88  HDL 50  CHOLHDL 3.4  VLDL 18  LDLCALC 10/19/19*   HgbA1c:  Recent Labs  Lab 10/17/19 0435  HGBA1C 6.3*   Urine Drug Screen:  Recent Labs  Lab 10/12/19 1955  LABOPIA NONE DETECTED  COCAINSCRNUR NONE DETECTED  LABBENZ NONE DETECTED  AMPHETMU NONE DETECTED  THCU NONE DETECTED  LABBARB NONE DETECTED    Alcohol Level No results for input(s): ETH in the last 168 hours.  IMAGING past 24 hours VAS 10/14/19 CAROTID (at [facility] and [facility] only)  Result Date: 10/17/2019 Carotid Arterial Duplex Study Indications:       CVA. Risk Factors:      Current smoker. Comparison  Study:  No prior studies. Performing Technologist: 10/19/2019  Examination Guidelines: A complete evaluation includes B-mode imaging, spectral Doppler, color Doppler, and power Doppler as needed of all accessible portions of each vessel. Bilateral testing is considered an integral part of a complete examination. Limited examinations for reoccurring indications may be performed as noted.  Right Carotid Findings: +----------+--------+--------+--------+------------------+------------------+           PSV cm/sEDV cm/sStenosisPlaque DescriptionComments           +----------+--------+--------+--------+------------------+------------------+ CCA Prox  146     26                                                   +----------+--------+--------+--------+------------------+------------------+  CCA Distal148     25                                intimal thickening +----------+--------+--------+--------+------------------+------------------+ ICA Prox  152     18      1-39%   heterogenous                         +----------+--------+--------+--------+------------------+------------------+ ICA Distal85      23                                                   +----------+--------+--------+--------+------------------+------------------+ ECA       139     19                                                   +----------+--------+--------+--------+------------------+------------------+ +----------+--------+-------+----------------+-------------------+           PSV cm/sEDV cmsDescribe        Arm Pressure (mmHG) +----------+--------+-------+----------------+-------------------+ [id number]            Multiphasic, WNL                    +----------+--------+-------+----------------+-------------------+ +---------+--------+--+--------+--+---------+ VertebralPSV cm/s53EDV cm/s12Antegrade +---------+--------+--+--------+--+---------+  Left Carotid Findings:  +----------+--------+--------+--------+------------------+------------------+           PSV cm/sEDV cm/sStenosisPlaque DescriptionComments           +----------+--------+--------+--------+------------------+------------------+ CCA Prox  169     28                                intimal thickening +----------+--------+--------+--------+------------------+------------------+ CCA Distal122     21                                intimal thickening +----------+--------+--------+--------+------------------+------------------+ ICA Prox  48      16      1-39%   heterogenous                         +----------+--------+--------+--------+------------------+------------------+ ICA Distal112     29                                                   +----------+--------+--------+--------+------------------+------------------+ ECA       143     18                                                   +----------+--------+--------+--------+------------------+------------------+ +----------+--------+--------+----------------+-------------------+           PSV cm/sEDV cm/sDescribe        Arm Pressure (mmHG) +----------+--------+--------+----------------+-------------------+ [id number]             Multiphasic, WNL                    +----------+--------+--------+----------------+-------------------+ +---------+--------+--+--------+--+---------+  VertebralPSV cm/s63EDV cm/s11Antegrade +---------+--------+--+--------+--+---------+   Summary: Right Carotid: Velocities in the right ICA are consistent with a 1-39% stenosis. Left Carotid: Velocities in the left ICA are consistent with a 1-39% stenosis. Vertebrals:  Bilateral vertebral arteries demonstrate antegrade flow. Subclavians: Normal flow hemodynamics were seen in bilateral subclavian              arteries. *See table(s) above for measurements and observations.  Electronically signed by Coral Else MD on 10/17/2019 at 9:32:25  PM.    Final     PHYSICAL EXAM   Vitals:   10/18/19 0351 10/18/19 0851  BP: 108/70 104/77  Pulse: (!) 58 61  Resp: 17 16  Temp: 97.7 F (36.5 C) (!) 97.5 F (36.4 C)  SpO2: 96% 100%    General - Well nourished, well developed, in no apparent distress.  Ophthalmologic - fundi not visualized due to noncooperation.  Cardiovascular - Regular rhythm and rate.  Neuro - awake alert, initially talkative, able to say "weak" "blurry" but then became nonverbal, not answer questions even with prompt and encouragement. Not following simple commands. Not name or repeat. With prompt, able to have bilateral gaze, PERRL, not consistent blinking to visual threat bilaterally. Mild left facial droop. RUE at least 4/5 no drift. Left UE able to hold in air but with pronator drift. BLEs at least 3-/5 with drift but seems symmetrical. No babinski. Sensation, coordination not corporative and gait not tested.   ASSESSMENT/PLAN Mr. Raijon Lindfors is a 54 y.o. male with history of  depression, DM2, CHF with EF of 15%, EtOH use, cocaine use who is admitted 10/12/2019 with SOB and chest pain. On Ativan PRN per CIWA protocol. On 10/16/2019 noted to have difficulty with language (garbled speech and not following commands). A CTH showed an acute/subacute right posterior MCA territory infarct.   Stroke:   R MCA infarct embolic most likely secondary to cardiomyopathy w/ low EF in setting of alcohol and cocaine use  CT head acute/subacute R poster MCA infarct w/ mild local mass effect.  MRI  Moderate R frontoparietal, posterior R MCA infarct  MRA  Unremarkable   Carotid Doppler  B ICA 1-39% stenosis, VAs antegrade   2D Echo EF 15-20%. No source of embolus. LA severely dilated  LDL 102  HgbA1c 6.3  UDS neg  VTE prophylaxis - Lovenox 40 mg sq daily   No antithrombotic prior to admission, now on aspirin 81 mg daily. From neuro standpoint, recommend anticoagulation in 7-10 days post stroke (9/27-9/30) given  severe cardiomyopathy with stroke.   Therapy recommendations:  CIR  Disposition:  pending   Cardiomyopathy  Acute on chronic systolic Congestive heart failure  Admitted for short of breath and chest pain  Elevated troponin  2D Echo EF 15-20%.   Could related to alcohol and cocaine  Likely the source of current stroke  now on aspirin 81 mg daily.   Recommend anticoagulation in 7-10 days post stroke (9/27-9/30) given severe cardiomyopathy with stroke.  AC can be discontinued once EF > 30%-35%.   Once off AC, recommend 30 day cardiac event monitoring or loop recorder to rule out afib.  Alcohol withdrawal  On CIWA protocol  Has sitter at bedside  advised to drink no more than 2 drink(s) a day from stroke perspective  Tobacco abuse  Current smoker  Smoking cessation counseling provided  Pt is willing to quit  Hypertension  Stable on the low side - likely due to cardiomyopathy  Avoid low BP . Long-term BP  goal normotensive  Hyperlipidemia  Home meds:  No statin  Now on lipitor 40  LDL 102, goal < 70  AST/ALT 31/51 - monitor LFT as outpt  Continue statin at discharge  Diabetes type II Controlled  HgbA1c 6.3, goal < 7.0  CBGs  SSI  PCP follow up  Other Stroke Risk Factors  Polysubstance abuse  Other Active Problems  Acute encephalopathy, possible DTs, Wernicke's given alcohol abuse  Mild transaminitis, improved  Elevated troponins/CP  Schizophrenia   Hospital day # 6  Neurology will sign off. Please call with questions. Pt will follow up with stroke clinic NP at [facility] in about 4 weeks. Thanks for the consult.  Marvel Plan, MD PhD Stroke Neurology 10/18/2019 4:24 PM  To contact Stroke Continuity provider, please refer to [email or web address]. After hours, contact General Neurology

## 2019-10-17 NOTE — TOC CAGE-AID Note (Signed)
Transition of Care [facility]) - CAGE-AID Screening   Patient Details  Name: Kerry Hamilton MRN: [id number] Date of Birth: 1965/06/26  [facility] CM/SW Contact:    Jimmy Picket, [place] Phone Number: 10/17/2019, 12:22 PM   Clinical Narrative:  PT is unable to participate in assessment due to only being oriented to self. Please re-consult when medically stable.  CAGE-AID Screening: Substance Abuse Screening unable to be completed due to: : Patient unable to participate            Isabella Stalling Clinical Social Worker [phone]

## 2019-10-17 NOTE — Progress Notes (Signed)
PROGRESS NOTE    Kerry Hamilton  OJJ:[id number] DOB: 04-11-1965 DOA: 10/11/2019 PCP: Medicine, [facility]   Chief Complaint  Patient presents with  . Chest Pain    Brief  Per chart: 54 year old man with EF 15%, DM 2 was admitted yesterday with shortness of breath and chest pain.  She had been drinking a lot and using crack for the last week.  Laboratory data was noted for decompensated heart failure and troponin around 25.Patient was treated with Lasix IV and seen by the advanced heart failure team.  On day 3 after admission patient was noted to be talking to himself and somewhat confused.  Patient had been on CIWA protocol and is thought to be in DTs. Ativan has been effective in controlling his agitation.  9/21: Continues to be confused, not following instruction moving upper extremities, CT head obtained that showed acute stroke and neurology was consulted and transferred to Neuro floor 3W.  Subjective:  Alert,awake Walked to bathroom with assistance, says few words, not following commands?/ Afebrile, labs stable spoke  With sister  Assessment & Plan:  Acute on chronic systolic CHF 2/2 Nonischemic cardiomyopathy, suspect EtOH abuse, echo 5/20 EF 15% right/left HC 5/20-no CAD and preserved cardiac output currently admitted with low output CHF with poor medical compliance and ongoing polysubstance abuse.Cardiology following closely EF showed 15 to 20% on the echo with severe RV dysfunction, moderate MR. On a spironolactone, losartan digoxin Lasix as per cardiology. Stable from CHF standpoint no further work-up planned as per cardiology.  Confusion/acute encephalopathy: possible DTs, werneckie's? ginve his alcohol abuse-on high dose thiamine as per neuro. MRI w/ acute stroke. Has been on CIWA but not needing ativan, score low. Cont supportive care, reorientation, fall precautions.  Acute stroke:Neurology on board.lipdid panel LDL at 102,hba1c 6.3. MRA no LVO or stenosis. MRI  limited- mod sized acute ischemic infarct rt frontoparietal region, post Rt MCA. Cont asa, added lipitor 80 mg. monitor neurochecks.  Follow-up carotid duplex.  Polysubstance abuse:with cocaine, etoh.  Patient sister confirmed that  And uses drugs, drinks alcohol.  Diabetes mellitus type 2, noninsulin dependent. Controlled hba1c 6.3, sugar stable on ssi, cont to monitor cbg Recent Labs  Lab 10/16/19 1535 10/16/19 1823 10/16/19 2246 10/17/19 0617 10/17/19 0829  GLUCAP 115* 106* 130* 100* 206*    Elevated troponin/chest pain, troponin mildly elevated: Seen by cardiology doubt ischemic no CAD on catheter in 2020.  Mild transaminitis abdomen ultrasound unrevealing, LFTs have improved. Recent Labs  Lab 10/12/19 1130 10/17/19 0435  AST 37 31  ALT 108* 51*  ALKPHOS 147* 111  BILITOT 0.6 0.5  PROT 6.3* 7.3  ALBUMIN 3.3* 3.5  INR 1.2  --    DVT prophylaxis: enoxaparin (LOVENOX) injection 40 mg Start: 10/12/19 1600 Code Status:   Code Status: Full Code   Family Communication: plan of care discussed with patient at bedside. I UPDATED HIS SISTER OVER THE PHONE  Status is: Inpatient Remains inpatient appropriate because:Acute stroke, confusion, acute systolic CHF Dispo: The patient is from: Home              Anticipated d/c is to: SNF              Anticipated d/c date is: > 3 days              Patient currently is not medically stable to d/c. Nutrition: Diet Order            Diet Carb Modified Fluid consistency: Thin; Room  service appropriate? No  Diet effective now                 Body mass index is 22.28 kg/m. Consultants:see note  Procedures: TTE 9/17 1. Left ventricular ejection fraction, by estimation, is 15-20%. The left  ventricle has severely decreased function. The left ventricle demonstrates  global hypokinesis. The left ventricular internal cavity size was severely  dilated. Left ventricular  diastolic parameters are consistent with Grade III diastolic  dysfunction  (restrictive). Elevated left atrial pressure.  2. Right ventricular systolic function is severely reduced. The right  ventricular size is severely enlarged. There is moderately elevated  pulmonary artery systolic pressure. The estimated right ventricular  systolic pressure is 54.8 mmHg.  3. Left atrial size was severely dilated.  4. Right atrial size was moderately dilated.  5. Severe mitral regurgitation. Restricted PMVL in systole due to dilated  cardiomyopathy (IIIb). The mitral valve is abnormal. Moderate to severe  mitral valve regurgitation.  6. Tricuspid valve regurgitation is moderate to severe.  7. The aortic valve is tricuspid. Aortic valve regurgitation is not  visualized. No aortic stenosis is present.  8. The inferior vena cava is normal in size with <50% respiratory  variability, suggesting right atrial pressure of 8 mmHg  MRI HEAD IMPRESSION: 9/21  1. Limited exam due to the patient's inability to tolerate the full length of the study. Diffusion-weighted imaging only was performed. 2. Moderate-sized acute ischemic infarct involving the right frontoparietal region, posterior right MCA distribution. No associated mass effect. 3. No other definite acute intracranial abnormality.  MRA HEAD IMPRESSION: 9/21  Negative intracranial MRA. No large vessel occlusion or hemodynamically significant stenosis. No aneurysm  Microbiology:see note Blood Culture    Component Value Date/Time   SDES  06/10/2018 1803    BLOOD RIGHT FOREARM Performed at [facility], [address], [place], [place] [address]    SPECREQUEST  06/10/2018 1803    BOTTLES DRAWN AEROBIC ONLY Blood Culture results may not be optimal due to an inadequate volume of blood received in culture bottles Performed at [facility], [address] [place], [place] [address]    CULT  06/10/2018 1803    NO GROWTH 5 DAYS Performed at [facility], [address], [place], [place] [address]    REPTSTATUS 06/16/2018 FINAL 06/10/2018 1803    Other culture-see note  Medications: Scheduled Meds: . aspirin  81 mg Oral Daily  . atorvastatin  80 mg Oral QHS  . digoxin  0.125 mg Oral Daily  . enoxaparin (LOVENOX) injection  40 mg Subcutaneous Q24H  . folic acid  1 mg Oral Daily  . furosemide  40 mg Oral Daily  . insulin aspart  0-5 Units Subcutaneous QHS  . insulin aspart  0-9 Units Subcutaneous TID WC  . losartan  25 mg Oral Daily  . multivitamin with minerals  1 tablet Oral Daily  . sodium chloride flush  3 mL Intravenous Q12H  . spironolactone  25 mg Oral Daily  . [START ON 10/24/2019] thiamine injection  100 mg Intravenous Daily   Continuous Infusions: . sodium chloride 250 mL (10/16/19 2306)  . thiamine injection 500 mg (10/17/19 0659)   Followed by  . [START ON 10/18/2019] thiamine injection      Antimicrobials: Anti-infectives (From admission, onward)   None     Objective: Vitals: Today's Vitals   10/17/19 0025 10/17/19 0341 10/17/19 0500 10/17/19 0808  BP: 120/87 103/64  107/73  Pulse: (!) 55 (!) 52  75  Resp: 18 18    Temp: 98.8 F (37.1 C) 97.6 F (36.4 C)  (!) 97.5 F (36.4 C)  TempSrc:    Axillary  SpO2: 100% 99%  98%  Weight:   74.5 kg   Height:      PainSc:        Intake/Output Summary (Last 24 hours) at 10/17/2019 1029 Last data filed at 10/17/2019 0900 Gross per 24 hour  Intake 517.69 ml  Output --  Net 517.69 ml   Filed Weights   10/15/19 0430 10/16/19 0437 10/17/19 0500  Weight: 76.2 kg 74.3 kg 74.5 kg   Weight change: 0.2 kg  Intake/Output from previous day: 09/21 0701 - 09/22 0700 In: 337.7 [P.O.:180; I.V.:57.7; IV Piggyback:100] Out: -  Intake/Output this shift: Total I/O In: 180 [P.O.:180] Out: -   Examination:  General exam: Alert awake, not speaking this morning.  NAD, weak appearing. HEENT:Oral mucosa moist, Ear/Nose WNL grossly, dentition normal. Respiratory system: bilaterally  clear,no wheezing or crackles,no use of accessory muscle Cardiovascular system: S1 & S2 +, No JVD,. Gastrointestinal system: Abdomen soft, NT,ND, BS+ Nervous System:Alert, awake,  Unclear if he is following any commands but per nursing he did walk with full strength with assistance to the bathroom Extremities: No edema, distal peripheral pulses palpable.  Skin: No rashes,no icterus. MSK: Normal muscle bulk,tone, power  Data Reviewed: I have personally reviewed following labs and imaging studies CBC: Recent Labs  Lab 10/11/19 1645 10/13/19 0600 10/14/19 1248 10/17/19 0435  WBC 4.6 4.2 4.1 4.3  HGB 12.0* 12.1* 13.5 15.9  HCT 39.5 38.3* 43.6 49.5  MCV 89.4 85.5 87.4 87.0  PLT 210 208 244 224   Basic Metabolic Panel: Recent Labs  Lab 10/13/19 0600 10/14/19 1248 10/15/19 0633 10/16/19 0435 10/17/19 0435  NA 139 136 137 136 135  K 3.6 4.4 4.5 4.3 4.4  CL 106 106 107 103 102  CO2 GLUCOSE 84 102* 98 87 104*  BUN CREATININE 1.13 1.13 1.28* 1.18 1.15  CALCIUM 8.4* 8.7* 9.1 9.3 9.5  MG 1.5* 2.2  --   --   --   PHOS 3.6  --   --   --   --    GFR: Estimated Creatinine Clearance: 78.3 mL/min (by C-G formula based on SCr of 1.15 mg/dL). Liver Function Tests: Recent Labs  Lab 10/12/19 1130 10/17/19 0435  AST 37 31  ALT 108* 51*  ALKPHOS 147* 111  BILITOT 0.6 0.5  PROT 6.3* 7.3  ALBUMIN 3.3* 3.5   Recent Labs  Lab 10/12/19 1130  LIPASE 69*   No results for input(s): AMMONIA in the last 168 hours. Coagulation Profile: Recent Labs  Lab 10/12/19 1130  INR 1.2   Cardiac Enzymes: No results for input(s): CKTOTAL, CKMB, CKMBINDEX, TROPONINI in the last 168 hours. BNP (last 3 results) No results for input(s): PROBNP in the last 8760 hours. HbA1C: Recent Labs    10/17/19 0435  HGBA1C 6.3*   CBG: Recent Labs  Lab 10/16/19 1535 10/16/19 1823 10/16/19 2246 10/17/19 0617 10/17/19 0829  GLUCAP 115* 106* 130* 100* 206*   Lipid  Profile: Recent Labs    10/17/19 0435  CHOL 170  HDL 50  LDLCALC 102*  TRIG 88  CHOLHDL 3.4   Thyroid Function Tests: No results for input(s): TSH, T4TOTAL, FREET4, T3FREE, THYROIDAB in the last 72 hours. Anemia Panel: No results for  input(s): VITAMINB12, FOLATE, FERRITIN, TIBC, IRON, RETICCTPCT in the last 72 hours. Sepsis Labs: No results for input(s): PROCALCITON, LATICACIDVEN in the last 168 hours.  Recent Results (from the past 240 hour(s))  SARS Coronavirus 2 by RT PCR (hospital order, performed in [facility] hospital lab) Nasopharyngeal Nasopharyngeal Swab     Status: None   Collection Time: 10/11/19  4:45 PM   Specimen: Nasopharyngeal Swab  Result Value Ref Range Status   SARS Coronavirus 2 NEGATIVE NEGATIVE Final    Comment: (NOTE) SARS-CoV-2 target nucleic acids are NOT DETECTED.  The SARS-CoV-2 RNA is generally detectable in upper and lower respiratory specimens during the acute phase of infection. The lowest concentration of SARS-CoV-2 viral copies this assay can detect is 250 copies / mL. A negative result does not preclude SARS-CoV-2 infection and should not be used as the sole basis for treatment or other patient management decisions.  A negative result may occur with improper specimen collection / handling, submission of specimen other than nasopharyngeal swab, presence of viral mutation(s) within the areas targeted by this assay, and inadequate number of viral copies (<250 copies / mL). A negative result must be combined with clinical observations, patient history, and epidemiological information.  Fact Sheet for Patients:   BoilerBrush.com.cy  Fact Sheet for Healthcare Providers: https://pope.com/  This test is not yet approved or  cleared by the [place] FDA and has been authorized for detection and/or diagnosis of SARS-CoV-2 by FDA under an Emergency Use Authorization (EUA).  This EUA will  remain in effect (meaning this test can be used) for the duration of the COVID-19 declaration under Section 564(b)(1) of the Act, 21 U.S.C. section 360bbb-3(b)(1), unless the authorization is terminated or revoked sooner.  Performed at [facility], [address], [place], [place] [address]      Radiology Studies: CT HEAD WO CONTRAST  Result Date: 10/16/2019 CLINICAL DATA:  Mental status change. EXAM: CT HEAD WITHOUT CONTRAST TECHNIQUE: Contiguous axial images were obtained from the base of the skull through the vertex without intravenous contrast. COMPARISON:  10/04/2018 FINDINGS: Brain: Loss of gray-white differentiation and cytotoxic edema involving the posterior right frontal and parietal lobes (MCA territory). Mild mass effect, including sulcal effacement. No midline shift. Basal cisterns are patent. No mass occupying hemorrhagic transformation. No hydrocephalus. Partially empty sella. Vascular: Calcific intracranial atherosclerosis. Skull: Normal. Negative for fracture or focal lesion. Sinuses/Orbits: No acute finding. Other: No mastoid effusions. IMPRESSION: 1. Findings consistent with an acute/subacute right posterior MCA territory infarct. MRI could further characterize, if clinically indicated. 2. Mild local mass effect without midline shift. No mass occupying hemorrhagic transformation. These results will be called to the ordering clinician or representative by the Radiologist Assistant, and communication documented in the PACS or [employer]. Electronically Signed   By: Feliberto Harts MD   On: 10/16/2019 11:21   MR ANGIO HEAD WO CONTRAST  Result Date: 10/16/2019 CLINICAL DATA:  Initial evaluation for neuro deficit, stroke suspected. EXAM: MRI HEAD WITHOUT CONTRAST MRA HEAD WITHOUT CONTRAST TECHNIQUE: Multiplanar, multiecho pulse sequences of the brain and surrounding structures were obtained without intravenous contrast. Angiographic images of the head were obtained using  MRA technique without contrast. COMPARISON:  Prior CT from earlier the same day. FINDINGS: MRI HEAD FINDINGS Brain: Examination is markedly limited as the patient was unable to tolerate the full length of the study. Axial and coronal DWI sequences only were performed. Diffusion-weighted sequence demonstrates a confluent area of restricted diffusion involving the posterior right  frontoparietal region, consistent with an acute posterior right MCA territory infarct. Mild involvement of the posterior right insula, with extension to involve the right periatrial white matter. No significant regional mass effect. No obvious evidence for associated hemorrhage, although evaluation for this is markedly limited on this exam. No other diffusion abnormality to suggest acute or subacute ischemia seen elsewhere within the brain. Gray-white matter differentiation otherwise maintained. No other mass effect or midline shift. No obvious mass lesion. Ventricles normal size without hydrocephalus. No visible extra-axial fluid collection. Vascular: Not assessed on this limited exam. Skull and upper cervical spine: Not assessed on this limited exam. Sinuses/Orbits: Not assessed on this limited exam. Other: None. MRA HEAD FINDINGS ANTERIOR CIRCULATION: Visualized distal cervical segments of the internal carotid arteries widely patent with symmetric antegrade flow. Petrous, cavernous, and supraclinoid segments widely patent without stenosis or other abnormality. A1 segments patent bilaterally. Normal anterior communicating artery complex. Anterior cerebral arteries widely patent to their distal aspects without stenosis. No M1 stenosis or occlusion. Left M1 bifurcates early. Bifurcations M cells are within normal limits. No visible proximal MCA branch occlusion. POSTERIOR CIRCULATION: Both vertebral arteries are widely patent to the vertebrobasilar junction without stenosis. Left vertebral artery slightly dominant. Neither PICA origin  visualized. Basilar widely patent to its distal aspect without stenosis. Superior cerebral arteries patent bilaterally. Both PCAs primarily supplied via the basilar and are well perfused to their distal aspects. Small bilateral posterior communicating arteries noted. No intracranial aneurysm or other vascular abnormality. IMPRESSION: MRI HEAD IMPRESSION: 1. Limited exam due to the patient's inability to tolerate the full length of the study. Diffusion-weighted imaging only was performed. 2. Moderate-sized acute ischemic infarct involving the right frontoparietal region, posterior right MCA distribution. No associated mass effect. 3. No other definite acute intracranial abnormality. MRA HEAD IMPRESSION: Negative intracranial MRA. No large vessel occlusion or hemodynamically significant stenosis. No aneurysm. Electronically Signed   By: Rise Mu M.D.   On: 10/16/2019 19:02   MR BRAIN WO CONTRAST  Result Date: 10/16/2019 CLINICAL DATA:  Initial evaluation for neuro deficit, stroke suspected. EXAM: MRI HEAD WITHOUT CONTRAST MRA HEAD WITHOUT CONTRAST TECHNIQUE: Multiplanar, multiecho pulse sequences of the brain and surrounding structures were obtained without intravenous contrast. Angiographic images of the head were obtained using MRA technique without contrast. COMPARISON:  Prior CT from earlier the same day. FINDINGS: MRI HEAD FINDINGS Brain: Examination is markedly limited as the patient was unable to tolerate the full length of the study. Axial and coronal DWI sequences only were performed. Diffusion-weighted sequence demonstrates a confluent area of restricted diffusion involving the posterior right frontoparietal region, consistent with an acute posterior right MCA territory infarct. Mild involvement of the posterior right insula, with extension to involve the right periatrial white matter. No significant regional mass effect. No obvious evidence for associated hemorrhage, although evaluation for  this is markedly limited on this exam. No other diffusion abnormality to suggest acute or subacute ischemia seen elsewhere within the brain. Gray-white matter differentiation otherwise maintained. No other mass effect or midline shift. No obvious mass lesion. Ventricles normal size without hydrocephalus. No visible extra-axial fluid collection. Vascular: Not assessed on this limited exam. Skull and upper cervical spine: Not assessed on this limited exam. Sinuses/Orbits: Not assessed on this limited exam. Other: None. MRA HEAD FINDINGS ANTERIOR CIRCULATION: Visualized distal cervical segments of the internal carotid arteries widely patent with symmetric antegrade flow. Petrous, cavernous, and supraclinoid segments widely patent without stenosis or other abnormality. A1 segments patent bilaterally. Normal anterior  communicating artery complex. Anterior cerebral arteries widely patent to their distal aspects without stenosis. No M1 stenosis or occlusion. Left M1 bifurcates early. Bifurcations M cells are within normal limits. No visible proximal MCA branch occlusion. POSTERIOR CIRCULATION: Both vertebral arteries are widely patent to the vertebrobasilar junction without stenosis. Left vertebral artery slightly dominant. Neither PICA origin visualized. Basilar widely patent to its distal aspect without stenosis. Superior cerebral arteries patent bilaterally. Both PCAs primarily supplied via the basilar and are well perfused to their distal aspects. Small bilateral posterior communicating arteries noted. No intracranial aneurysm or other vascular abnormality. IMPRESSION: MRI HEAD IMPRESSION: 1. Limited exam due to the patient's inability to tolerate the full length of the study. Diffusion-weighted imaging only was performed. 2. Moderate-sized acute ischemic infarct involving the right frontoparietal region, posterior right MCA distribution. No associated mass effect. 3. No other definite acute intracranial abnormality.  MRA HEAD IMPRESSION: Negative intracranial MRA. No large vessel occlusion or hemodynamically significant stenosis. No aneurysm. Electronically Signed   By: Rise Mu M.D.   On: 10/16/2019 19:02     LOS: 5 days   Lanae Boast, MD [facility]  10/17/2019, 10:29 AM

## 2019-10-17 NOTE — Evaluation (Addendum)
Physical Therapy Evaluation Patient Details Name: Kerry Hamilton MRN: [id number] DOB: 13-Jul-1965 Today's Date: 10/17/2019   History of Present Illness  Pt is a 54 y.o. male admitted 10/11/19 with SOB and chest pain; chart states pt with ETOH and cocaine use for the past week. Workup for acute on chronic CHF secondary to nonischemic cardiomyopathy, suspect ETOH abuse. Worsening confusion noted 9/20-9/21; MRI with acute, moderate-sized infarct in R frontoparietal region. PMH includes substance abuse, DM2, CAD, depression.    Clinical Impression  Pt presents with an overall decrease in functional mobility secondary to above. Pt poor historian with minimal communication this session. Today, pt required modA+2 for mobility. Pt limited by generalized weakness, poor balance strategies, and impaired cognition, including not following simple commands; difficult to address true cognitive impairment secondary to apparent aphasia. Pt would benefit from continued acute PT services to maximize functional mobility and independence prior to d/c with post-acute rehab services.     Follow Up Recommendations CIR;Supervision/Assistance - 24 hour    Equipment Recommendations   (defer)    Recommendations for Other Services       Precautions / Restrictions Precautions Precautions: Fall Restrictions Weight Bearing Restrictions: No      Mobility  Bed Mobility Overal bed mobility: Needs Assistance Bed Mobility: Supine to Sit;Sit to Supine     Supine to sit: Min assist Sit to supine: Supervision   General bed mobility comments: MinA to initiate movement to EOB, pt restless requiring multimodal cues to task; return to supine with supervision for safety  Transfers Overall transfer level: Needs assistance Equipment used: 2 person hand held assist Transfers: Sit to/from Stand Sit to Stand: Mod assist;+2 physical assistance;+2 safety/equipment         General transfer comment: ModA+2 to initiate standing  and maintain stability; pt not following verbal commands consistently  Ambulation/Gait Ambulation/Gait assistance: Mod assist;+2 physical assistance;+2 safety/equipment Gait Distance (Feet): 12 Feet Assistive device: 2 person hand held assist Gait Pattern/deviations: Step-to pattern;Wide base of support;Staggering right;Staggering left;Trunk flexed;Leaning posteriorly Gait velocity: Decreased   General Gait Details: Slow, unsteady gait with intermittent HHA and modA+2 to maintain balance; pt attempting to grab snacks from table, reaching outside BOS requiring assist to correct  Stairs            Wheelchair Mobility    Modified Rankin (Stroke Patients Only) Modified Rankin (Stroke Patients Only) Pre-Morbid Rankin Score: No symptoms Modified Rankin: Moderately severe disability     Balance Overall balance assessment: Needs assistance   Sitting balance-Leahy Scale: Fair       Standing balance-Leahy Scale: Poor Standing balance comment: Reliant on HHA or external assist to maintain static standing                             Pertinent Vitals/Pain Pain Assessment: Faces Faces Pain Scale: No hurt Pain Intervention(s): Monitored during session    Home Living Family/patient expects to be discharged to:: Inpatient rehab                 Additional Comments: Pt poor historian, not answering questions regarding home set-up or PLOF    Prior Function           Comments: Pt poor historian, not answering questions regarding home set-up or PLOF. Per chart, was appropriately conversive upon admission regarding chest pain; sister reports h/o "stuttering"     Hand Dominance        Extremity/Trunk Assessment   Upper Extremity  Assessment Upper Extremity Assessment: Generalized weakness    Lower Extremity Assessment Lower Extremity Assessment: Generalized weakness       Communication   Communication: Expressive difficulties;Receptive difficulties  (??)  Cognition Arousal/Alertness: Awake/alert Behavior During Therapy: Restless;Flat affect Overall Cognitive Status: Difficult to assess                                 General Comments: Pt with minimal interaction with therapists this session; not answering questions and inconsistently forming sentences; stuttering speech at times, but presents more as difficulty word-finding; difficult to determine true cognitive impairment (receptive/expressive difficulties?) vs. desire to interact with PT/OT      General Comments      Exercises     Assessment/Plan    PT Assessment Patient needs continued PT services  PT Problem List Decreased activity tolerance;Decreased strength;Decreased balance;Decreased mobility;Decreased cognition;Decreased knowledge of use of DME;Decreased safety awareness       PT Treatment Interventions DME instruction;Gait training;Stair training;Functional mobility training;Therapeutic activities;Therapeutic exercise;Balance training;Neuromuscular re-education;Cognitive remediation;Patient/family education    PT Goals (Current goals can be found in the Care Plan section)  Acute Rehab PT Goals Patient Stated Goal: "I gotta have my sweet" PT Goal Formulation: With patient Time For Goal Achievement: 10/31/19 Potential to Achieve Goals: Fair    Frequency Min 4X/week   Barriers to discharge        Co-evaluation PT/OT/SLP Co-Evaluation/Treatment: Yes Reason for Co-Treatment: Necessary to address cognition/behavior during functional activity;For patient/therapist safety;To address functional/ADL transfers PT goals addressed during session: Mobility/safety with mobility;Balance         AM-PAC PT "6 Clicks" Mobility  Outcome Measure Help needed turning from your back to your side while in a flat bed without using bedrails?: A Little Help needed moving from lying on your back to sitting on the side of a flat bed without using bedrails?: A  Little Help needed moving to and from a bed to a chair (including a wheelchair)?: A Lot Help needed standing up from a chair using your arms (e.g., wheelchair or bedside chair)?: A Lot Help needed to walk in hospital room?: A Lot Help needed climbing 3-5 steps with a railing? : A Lot 6 Click Score: 14    End of Session   Activity Tolerance: Patient tolerated treatment well;Other (comment) Patient left: in bed;with call bell/phone within reach;with bed alarm set;with restraints reapplied;with nursing/sitter in room Nurse Communication: Mobility status PT Visit Diagnosis: Other abnormalities of gait and mobility (R26.89);Muscle weakness (generalized) (M62.81);Other symptoms and signs involving the nervous system (R29.898)    Time: 3825-0539 PT Time Calculation (min) (ACUTE ONLY): 24 min   Charges:   PT Evaluation $PT Eval Moderate Complexity: 1 Mod     Ina Homes, PT, DPT Acute Rehabilitation Services  Pager [phone] Office [phone]  Malachy Chamber 10/17/2019, 4:08 PM

## 2019-10-18 DIAGNOSIS — I5023 Acute on chronic systolic (congestive) heart failure: Secondary | ICD-10-CM | POA: Diagnosis not present

## 2019-10-18 DIAGNOSIS — I63411 Cerebral infarction due to embolism of right middle cerebral artery: Secondary | ICD-10-CM | POA: Diagnosis not present

## 2019-10-18 LAB — CBC
HCT: 47 % (ref 39.0–52.0)
Hemoglobin: 15 g/dL (ref 13.0–17.0)
MCH: 27.2 pg (ref 26.0–34.0)
MCHC: 31.9 g/dL (ref 30.0–36.0)
MCV: 85.3 fL (ref 80.0–100.0)
Platelets: 255 K/uL (ref 150–400)
RBC: 5.51 MIL/uL (ref 4.22–5.81)
RDW: 12.8 % (ref 11.5–15.5)
WBC: 3.2 K/uL — ABNORMAL LOW (ref 4.0–10.5)
nRBC: 0 % (ref 0.0–0.2)

## 2019-10-18 LAB — COMPREHENSIVE METABOLIC PANEL
ALT: 47 U/L — ABNORMAL HIGH (ref 0–44)
AST: 34 U/L (ref 15–41)
Albumin: 3.3 g/dL — ABNORMAL LOW (ref 3.5–5.0)
Alkaline Phosphatase: 111 U/L (ref 38–126)
Anion gap: 10 (ref 5–15)
BUN: 22 mg/dL — ABNORMAL HIGH (ref 6–20)
CO2: 22 mmol/L (ref 22–32)
Calcium: 9.4 mg/dL (ref 8.9–10.3)
Chloride: 102 mmol/L (ref 98–111)
Creatinine, Ser: 1.41 mg/dL — ABNORMAL HIGH (ref 0.61–1.24)
GFR calc Af Amer: >60 mL/min (ref >60)
GFR calc non Af Amer: 56 mL/min — ABNORMAL LOW (ref >60)
Glucose, Bld: 171 mg/dL — ABNORMAL HIGH (ref 70–99)
Potassium: 4.6 mmol/L (ref 3.5–5.1)
Sodium: 134 mmol/L — ABNORMAL LOW (ref 135–145)
Total Bilirubin: 0.8 mg/dL (ref 0.3–1.2)
Total Protein: 7 g/dL (ref 6.5–8.1)

## 2019-10-18 LAB — GLUCOSE, CAPILLARY
Glucose-Capillary: 86 mg/dL (ref 70–99)
Glucose-Capillary: 162 mg/dL — ABNORMAL HIGH (ref 70–99)
Glucose-Capillary: 136 mg/dL — ABNORMAL HIGH (ref 70–99)
Glucose-Capillary: 157 mg/dL — ABNORMAL HIGH (ref 70–99)
Glucose-Capillary: 115 mg/dL — ABNORMAL HIGH (ref 70–99)

## 2019-10-18 NOTE — NC FL2 (Signed)
[place] MEDICAID FL2 LEVEL OF CARE SCREENING TOOL     IDENTIFICATION  Patient Name: Kerry Hamilton Birthdate: 1966/01/01 Sex: male Admission Date (Current Location): 10/11/2019  [facility] [place] and [place] Number:  Producer, television/film/video and Address:  [facility], [address] [place], [place] [address]      Provider Number: [id number]  Attending Physician Name and Address:  Lanae Boast, MD  Relative Name and Phone Number:       Current Level of Care: Hospital Recommended Level of Care: Skilled Nursing Facility Prior Approval Number:    Date Approved/Denied:   PASRR Number: Manual review  Discharge Plan: SNF    Current Diagnoses: Patient Active Problem List   Diagnosis Date Noted  . Cerebral embolism with cerebral infarction 10/17/2019  . Schizoaffective disorder (HCC) 10/05/2018  . Acute CHF (congestive heart failure) (HCC) 08/01/2018  . Elevated liver enzymes 08/01/2018  . Acute on chronic systolic CHF (congestive heart failure) (HCC) 07/13/2018  . ARF (acute renal failure) (HCC) 07/13/2018  . Acute systolic CHF (congestive heart failure) (HCC)   . Elevated troponin   . Shortness of breath   . LFT elevation   . Prolonged QT interval   . Polysubstance abuse (HCC)   . ACS (acute coronary syndrome) (HCC) 06/10/2018  . Diabetes mellitus type 2, noninsulin dependent (HCC) 06/10/2018  . Depression 06/10/2018    Orientation RESPIRATION BLADDER Height & Weight      (disoriented)  Normal Continent Weight: 164 lb 3.9 oz (74.5 kg) Height:  6' (182.9 cm)  BEHAVIORAL SYMPTOMS/MOOD NEUROLOGICAL BOWEL NUTRITION STATUS      Continent Diet (see DC summary)  AMBULATORY STATUS COMMUNICATION OF NEEDS Skin   Limited Assist Verbally Normal                       Personal Care Assistance Level of Assistance  Bathing, Feeding, Dressing Bathing Assistance: Limited assistance Feeding assistance: Independent Dressing Assistance: Limited assistance      Functional Limitations Info  Speech     Speech Info: Impaired (dysarthria)    SPECIAL CARE FACTORS FREQUENCY  PT (By licensed PT), OT (By licensed OT), Speech therapy     PT Frequency: 5x/wk OT Frequency: 5x/wk     Speech Therapy Frequency: 5x/wk      Contractures Contractures Info: Not present    Additional Factors Info  Code Status, Allergies, Insulin Sliding Scale Code Status Info: Full Allergies Info: NKA   Insulin Sliding Scale Info: 0-9 units 3x/day with meals; 0-5 units daily at bed       Current Medications (10/18/2019):  This is the current hospital active medication list Current Facility-Administered Medications  Medication Dose Route Frequency Provider Last Rate Last Admin  . 0.9 %  sodium chloride infusion  250 mL Intravenous PRN Madelyn Flavors A, MD 10 mL/hr at 10/17/19 2317 Restarted at 10/17/19 2317  . acetaminophen (TYLENOL) tablet 650 mg  650 mg Oral Q4H PRN Madelyn Flavors A, MD   650 mg at 10/17/19 1647  . aspirin chewable tablet 81 mg  81 mg Oral Daily Smith, Rondell A, MD   81 mg at 10/18/19 1337  . atorvastatin (LIPITOR) tablet 40 mg  40 mg Oral QHS Marvel Plan, MD   40 mg at 10/17/19 2318  . digoxin (LANOXIN) tablet 0.125 mg  0.125 mg Oral Daily Bensimhon, Bevelyn Buckles, MD   0.125 mg at 10/18/19 1338  . enoxaparin (LOVENOX) injection 40 mg  40 mg Subcutaneous  Q24H Madelyn Flavors A, MD   40 mg at 10/17/19 1626  . folic acid (FOLVITE) tablet 1 mg  1 mg Oral Daily Smith, Rondell A, MD   1 mg at 10/18/19 1338  . furosemide (LASIX) tablet 40 mg  40 mg Oral Daily Leandro Reasoner Tublu, MD   40 mg at 10/18/19 1337  . insulin aspart (novoLOG) injection 0-5 Units  0-5 Units Subcutaneous QHS Clydie Braun, MD   2 Units at 10/13/19 2255  . insulin aspart (novoLOG) injection 0-9 Units  0-9 Units Subcutaneous TID WC Clydie Braun, MD   2 Units at 10/17/19 1635  . LORazepam (ATIVAN) tablet 1-4 mg  1-4 mg Oral Q1H PRN Lanae Boast, MD       Or  . LORazepam  (ATIVAN) injection 1-4 mg  1-4 mg Intravenous Q1H PRN Lanae Boast, MD   3 mg at 10/17/19 2329  . losartan (COZAAR) tablet 25 mg  25 mg Oral Daily Bensimhon, Bevelyn Buckles, MD   25 mg at 10/18/19 1337  . multivitamin with minerals tablet 1 tablet  1 tablet Oral Daily Madelyn Flavors A, MD   1 tablet at 10/18/19 1335  . nicotine polacrilex (NICORETTE) gum 2 mg  2 mg Oral PRN Madelyn Flavors A, MD      . sodium chloride flush (NS) 0.9 % injection 3 mL  3 mL Intravenous Q12H Smith, Rondell A, MD   3 mL at 10/17/19 2200  . sodium chloride flush (NS) 0.9 % injection 3 mL  3 mL Intravenous PRN Madelyn Flavors A, MD   3 mL at 10/12/19 2223  . spironolactone (ALDACTONE) tablet 25 mg  25 mg Oral Daily Bensimhon, Bevelyn Buckles, MD   25 mg at 10/18/19 1335  . thiamine (B-1) 250 mg in sodium chloride 0.9 % 50 mL IVPB  250 mg Intravenous Daily Erick Blinks, MD 100 mL/hr at 10/18/19 1345 250 mg at 10/18/19 1345   Followed by  . [START ON 10/24/2019] thiamine (B-1) injection 100 mg  100 mg Intravenous Daily Erick Blinks, MD         Discharge Medications: Please see discharge summary for a list of discharge medications.  Relevant Imaging Results:  Relevant Lab Results:   Additional Information SS#: [id number]  Baldemar Lenis, LCSW

## 2019-10-18 NOTE — Progress Notes (Signed)
Physical Therapy Treatment Patient Details Name: Kerry Hamilton MRN: [id number] DOB: December 13, 1965 Today's Date: 10/18/2019    History of Present Illness Pt is a 54 y.o. male admitted 10/11/19 with SOB and chest pain; chart states pt with ETOH and cocaine use for the past week. Workup for acute on chronic CHF secondary to nonischemic cardiomyopathy, suspect ETOH abuse. Worsening confusion noted 9/20-9/21; MRI with acute, moderate-sized infarct in R frontoparietal region. PMH includes substance abuse, DM2, CAD, depression.    PT Comments    Pt received in bed. Mod assist required for supine <> sit and +2 mod/HHA sit <> stand. During session, pt began perseverating on pulling out IV. Unable to redirect, requiring return to bed and placement of bilat mitts. RN notified and in room with sitter at end of session.    Follow Up Recommendations  CIR;Supervision/Assistance - 24 hour     Equipment Recommendations  Other (comment) (TBD)    Recommendations for Other Services       Precautions / Restrictions Precautions Precautions: Fall    Mobility  Bed Mobility Overal bed mobility: Needs Assistance Bed Mobility: Supine to Sit;Sit to Supine     Supine to sit: Mod assist;HOB elevated Sit to supine: Mod assist;HOB elevated   General bed mobility comments: cues to initiate and sequence, assist with BLE and trunk  Transfers Overall transfer level: Needs assistance Equipment used: 2 person hand held assist Transfers: Sit to/from Stand Sit to Stand: Mod assist;+2 physical assistance         General transfer comment: +2 required for bilat cues to initiate stand  Ambulation/Gait                 Stairs             Wheelchair Mobility    Modified Rankin (Stroke Patients Only) Modified Rankin (Stroke Patients Only) Pre-Morbid Rankin Score: No symptoms Modified Rankin: Moderately severe disability     Balance Overall balance assessment: Needs assistance   Sitting  balance-Leahy Scale: Fair                                      Cognition Arousal/Alertness: Awake/alert Behavior During Therapy: Flat affect;Restless;Agitated Overall Cognitive Status: No family/caregiver present to determine baseline cognitive functioning                                 General Comments: Minimal interaction with therapist. Word-finding difficulty. Perseverating on attempts to pull out IV. Unable to redirect. RN notified. Pt grimacing with palpation near IV site.      Exercises      General Comments        Pertinent Vitals/Pain Pain Assessment: Faces Faces Pain Scale: Hurts a little bit Pain Location: IV site R forearm Pain Descriptors / Indicators: Grimacing Pain Intervention(s): Monitored during session;Other (comment) (RN notified)    Home Living                      Prior Function            PT Goals (current goals can now be found in the care plan section) Acute Rehab PT Goals Patient Stated Goal: not stated Progress towards PT goals: Progressing toward goals    Frequency    Min 4X/week      PT Plan Current plan remains appropriate  Co-evaluation              AM-PAC PT "6 Clicks" Mobility   Outcome Measure  Help needed turning from your back to your side while in a flat bed without using bedrails?: A Little Help needed moving from lying on your back to sitting on the side of a flat bed without using bedrails?: A Lot Help needed moving to and from a bed to a chair (including a wheelchair)?: A Lot Help needed standing up from a chair using your arms (e.g., wheelchair or bedside chair)?: A Lot Help needed to walk in hospital room?: A Lot Help needed climbing 3-5 steps with a railing? : A Lot 6 Click Score: 13    End of Session Equipment Utilized During Treatment: Gait belt Activity Tolerance: Treatment limited secondary to agitation (perseverating on pulling out IV) Patient left: in  bed;with call bell/phone within reach;with nursing/sitter in room;with restraints reapplied Nurse Communication: Mobility status;Other (comment) (Pt attempting to pull out IV.) PT Visit Diagnosis: Other abnormalities of gait and mobility (R26.89);Muscle weakness (generalized) (M62.81);Other symptoms and signs involving the nervous system (J62.831)     Time: 5176-1607 PT Time Calculation (min) (ACUTE ONLY): 28 min  Charges:  $Therapeutic Activity: 23-37 mins                     Aida Raider, PT  Office # [phone] Pager [phone]    Ilda Foil 10/18/2019, 10:47 AM

## 2019-10-18 NOTE — Plan of Care (Signed)
  Problem: Education: Goal: Knowledge of General Education information will improve Description: Including pain rating scale, medication(s)/side effects and non-pharmacologic comfort measures Outcome: Progressing   Problem: Health Behavior/Discharge Planning: Goal: Ability to manage health-related needs will improve Outcome: Progressing   Problem: Clinical Measurements: Goal: Ability to maintain clinical measurements within normal limits will improve Outcome: Progressing Goal: Will remain free from infection Outcome: Progressing Goal: Diagnostic test results will improve Outcome: Progressing Goal: Respiratory complications will improve Outcome: Progressing Goal: Cardiovascular complication will be avoided Outcome: Progressing   Problem: Activity: Goal: Risk for activity intolerance will decrease Outcome: Progressing   Problem: Nutrition: Goal: Adequate nutrition will be maintained Outcome: Progressing   Problem: Coping: Goal: Level of anxiety will decrease Outcome: Progressing   Problem: Elimination: Goal: Will not experience complications related to bowel motility Outcome: Progressing Goal: Will not experience complications related to urinary retention Outcome: Progressing   Problem: Pain Managment: Goal: General experience of comfort will improve Outcome: Progressing   Problem: Safety: Goal: Ability to remain free from injury will improve Outcome: Progressing   Problem: Skin Integrity: Goal: Risk for impaired skin integrity will decrease Outcome: Progressing   Problem: Education: Goal: Ability to demonstrate management of disease process will improve Outcome: Progressing Goal: Ability to verbalize understanding of medication therapies will improve Outcome: Progressing Goal: Individualized Educational Video(s) Outcome: Progressing   Problem: Activity: Goal: Capacity to carry out activities will improve Outcome: Progressing   Problem: Cardiac: Goal:  Ability to achieve and maintain adequate cardiopulmonary perfusion will improve Outcome: Progressing   Problem: Education: Goal: Knowledge of secondary prevention will improve Outcome: Progressing Goal: Knowledge of patient specific risk factors addressed and post discharge goals established will improve Outcome: Progressing Goal: Individualized Educational Video(s) Outcome: Progressing   Problem: Self-Care: Goal: Verbalization of feelings and concerns over difficulty with self-care will improve Outcome: Progressing Goal: Ability to communicate needs accurately will improve Outcome: Progressing   

## 2019-10-18 NOTE — Progress Notes (Signed)
Name: Kerry Hamilton DOB: [birth year]  Please be advised that the above-named patient will require a short-term nursing home stay -- anticipated 30 days or less for rehabilitation and strengthening. The plan is for return home.

## 2019-10-18 NOTE — Progress Notes (Addendum)
Advanced Heart Failure Rounding Note   Subjective:    Moderate-large R MCA infarct on 9/21, felt to be embolic, most likely secondary to cardiomyopathy w/ low EF in setting of alcohol and cocaine use, per neuro.   No Afib detection on tele. Echo w/ EF 15-20%. No thrombus identified.  Continues w/ paraphasia.  Limited neuro exam. Attempted to check upper and lower extremity strength but unable to gauge how much is deficit vs pt's unwillingness to participate.    BP controlled, SBPs low 100s-110s.  Wt stable.   Will need CIR and continued speech therapy.     Objective:   Weight Range:  Vital Signs:   Temp:  [97.5 F (36.4 C)-98.2 F (36.8 C)] 97.5 F (36.4 C) (09/23 0851) Pulse Rate:  [53-67] 61 (09/23 0851) Resp:  [16-19] 16 (09/23 0851) BP: (102-115)/(68-77) 104/77 (09/23 0851) SpO2:  [96 %-100 %] 100 % (09/23 0851) Last BM Date: 10/16/19  Weight change: Filed Weights   10/15/19 0430 10/16/19 0437 10/17/19 0500  Weight: 76.2 kg 74.3 kg 74.5 kg    Intake/Output:   Intake/Output Summary (Last 24 hours) at 10/18/2019 0946 Last data filed at 10/17/2019 2254 Gross per 24 hour  Intake --  Output 1125 ml  Net -1125 ml     Physical Exam: General:   Middle aged male lying in bed, No resp difficulty. garbled speech  HEENT: normal Neck: supple. no JVD. Carotids 2+ bilat; no bruits. No lymphadenopathy or thryomegaly appreciated. Cor: PMI nondisplaced. Regular rate & rhythm. No rubs, gallops or murmurs. Lungs: clear Abdomen: soft, nontender, nondistended. No hepatosplenomegaly. No bruits or masses. Good bowel sounds. Extremities: no cyanosis, clubbing, rash, edema Neuro: alert w/ garbled speech. Neuro exam limited (see above)     Telemetry:  NSR 70s-80s. No Afib.   Labs: Basic Metabolic Panel: Recent Labs  Lab 10/13/19 0600 10/13/19 0600 10/14/19 1248 10/14/19 1248 10/15/19 0633 10/16/19 0435 10/17/19 0435  NA 139  --  136  --  137 136 135  K 3.6  --   4.4  --  4.5 4.3 4.4  CL 106  --  106  --  107 103 102  CO2 24  --  23  --  23 22 23   GLUCOSE 84  --  102*  --  98 87 104*  BUN 18  --  16  --  18 17 20   CREATININE 1.13  --  1.13  --  1.28* 1.18 1.15  CALCIUM 8.4*   < > 8.7*   < > 9.1 9.3 9.5  MG 1.5*  --  2.2  --   --   --   --   PHOS 3.6  --   --   --   --   --   --    < > = values in this interval not displayed.    Liver Function Tests: Recent Labs  Lab 10/12/19 1130 10/17/19 0435  AST 37 31  ALT 108* 51*  ALKPHOS 147* 111  BILITOT 0.6 0.5  PROT 6.3* 7.3  ALBUMIN 3.3* 3.5   Recent Labs  Lab 10/12/19 1130  LIPASE 69*   No results for input(s): AMMONIA in the last 168 hours.  CBC: Recent Labs  Lab 10/11/19 1645 10/13/19 0600 10/14/19 1248 10/17/19 0435  WBC 4.6 4.2 4.1 4.3  HGB 12.0* 12.1* 13.5 15.9  HCT 39.5 38.3* 43.6 49.5  MCV 89.4 85.5 87.4 87.0  PLT 210 208 244 224    Cardiac  Enzymes: No results for input(s): CKTOTAL, CKMB, CKMBINDEX, TROPONINI in the last 168 hours.  BNP: BNP (last 3 results) Recent Labs    11/15/18 1418 04/17/19 1758 10/11/19 1645  BNP 292.9* 1,169.4* 2,015.6*    ProBNP (last 3 results) No results for input(s): PROBNP in the last 8760 hours.    Other results:  Imaging: CT HEAD WO CONTRAST  Result Date: 10/16/2019 CLINICAL DATA:  Mental status change. EXAM: CT HEAD WITHOUT CONTRAST TECHNIQUE: Contiguous axial images were obtained from the base of the skull through the vertex without intravenous contrast. COMPARISON:  10/04/2018 FINDINGS: Brain: Loss of gray-white differentiation and cytotoxic edema involving the posterior right frontal and parietal lobes (MCA territory). Mild mass effect, including sulcal effacement. No midline shift. Basal cisterns are patent. No mass occupying hemorrhagic transformation. No hydrocephalus. Partially empty sella. Vascular: Calcific intracranial atherosclerosis. Skull: Normal. Negative for fracture or focal lesion. Sinuses/Orbits: No acute  finding. Other: No mastoid effusions. IMPRESSION: 1. Findings consistent with an acute/subacute right posterior MCA territory infarct. MRI could further characterize, if clinically indicated. 2. Mild local mass effect without midline shift. No mass occupying hemorrhagic transformation. These results will be called to the ordering clinician or representative by the Radiologist Assistant, and communication documented in the PACS or [employer]. Electronically Signed   By: Feliberto Harts MD   On: 10/16/2019 11:21   MR ANGIO HEAD WO CONTRAST  Result Date: 10/16/2019 CLINICAL DATA:  Initial evaluation for neuro deficit, stroke suspected. EXAM: MRI HEAD WITHOUT CONTRAST MRA HEAD WITHOUT CONTRAST TECHNIQUE: Multiplanar, multiecho pulse sequences of the brain and surrounding structures were obtained without intravenous contrast. Angiographic images of the head were obtained using MRA technique without contrast. COMPARISON:  Prior CT from earlier the same day. FINDINGS: MRI HEAD FINDINGS Brain: Examination is markedly limited as the patient was unable to tolerate the full length of the study. Axial and coronal DWI sequences only were performed. Diffusion-weighted sequence demonstrates a confluent area of restricted diffusion involving the posterior right frontoparietal region, consistent with an acute posterior right MCA territory infarct. Mild involvement of the posterior right insula, with extension to involve the right periatrial white matter. No significant regional mass effect. No obvious evidence for associated hemorrhage, although evaluation for this is markedly limited on this exam. No other diffusion abnormality to suggest acute or subacute ischemia seen elsewhere within the brain. Gray-white matter differentiation otherwise maintained. No other mass effect or midline shift. No obvious mass lesion. Ventricles normal size without hydrocephalus. No visible extra-axial fluid collection. Vascular: Not  assessed on this limited exam. Skull and upper cervical spine: Not assessed on this limited exam. Sinuses/Orbits: Not assessed on this limited exam. Other: None. MRA HEAD FINDINGS ANTERIOR CIRCULATION: Visualized distal cervical segments of the internal carotid arteries widely patent with symmetric antegrade flow. Petrous, cavernous, and supraclinoid segments widely patent without stenosis or other abnormality. A1 segments patent bilaterally. Normal anterior communicating artery complex. Anterior cerebral arteries widely patent to their distal aspects without stenosis. No M1 stenosis or occlusion. Left M1 bifurcates early. Bifurcations M cells are within normal limits. No visible proximal MCA branch occlusion. POSTERIOR CIRCULATION: Both vertebral arteries are widely patent to the vertebrobasilar junction without stenosis. Left vertebral artery slightly dominant. Neither PICA origin visualized. Basilar widely patent to its distal aspect without stenosis. Superior cerebral arteries patent bilaterally. Both PCAs primarily supplied via the basilar and are well perfused to their distal aspects. Small bilateral posterior communicating arteries noted. No intracranial aneurysm or other vascular abnormality. IMPRESSION:  MRI HEAD IMPRESSION: 1. Limited exam due to the patient's inability to tolerate the full length of the study. Diffusion-weighted imaging only was performed. 2. Moderate-sized acute ischemic infarct involving the right frontoparietal region, posterior right MCA distribution. No associated mass effect. 3. No other definite acute intracranial abnormality. MRA HEAD IMPRESSION: Negative intracranial MRA. No large vessel occlusion or hemodynamically significant stenosis. No aneurysm. Electronically Signed   By: Rise Mu M.D.   On: 10/16/2019 19:02   MR BRAIN WO CONTRAST  Result Date: 10/16/2019 CLINICAL DATA:  Initial evaluation for neuro deficit, stroke suspected. EXAM: MRI HEAD WITHOUT CONTRAST  MRA HEAD WITHOUT CONTRAST TECHNIQUE: Multiplanar, multiecho pulse sequences of the brain and surrounding structures were obtained without intravenous contrast. Angiographic images of the head were obtained using MRA technique without contrast. COMPARISON:  Prior CT from earlier the same day. FINDINGS: MRI HEAD FINDINGS Brain: Examination is markedly limited as the patient was unable to tolerate the full length of the study. Axial and coronal DWI sequences only were performed. Diffusion-weighted sequence demonstrates a confluent area of restricted diffusion involving the posterior right frontoparietal region, consistent with an acute posterior right MCA territory infarct. Mild involvement of the posterior right insula, with extension to involve the right periatrial white matter. No significant regional mass effect. No obvious evidence for associated hemorrhage, although evaluation for this is markedly limited on this exam. No other diffusion abnormality to suggest acute or subacute ischemia seen elsewhere within the brain. Gray-white matter differentiation otherwise maintained. No other mass effect or midline shift. No obvious mass lesion. Ventricles normal size without hydrocephalus. No visible extra-axial fluid collection. Vascular: Not assessed on this limited exam. Skull and upper cervical spine: Not assessed on this limited exam. Sinuses/Orbits: Not assessed on this limited exam. Other: None. MRA HEAD FINDINGS ANTERIOR CIRCULATION: Visualized distal cervical segments of the internal carotid arteries widely patent with symmetric antegrade flow. Petrous, cavernous, and supraclinoid segments widely patent without stenosis or other abnormality. A1 segments patent bilaterally. Normal anterior communicating artery complex. Anterior cerebral arteries widely patent to their distal aspects without stenosis. No M1 stenosis or occlusion. Left M1 bifurcates early. Bifurcations M cells are within normal limits. No visible  proximal MCA branch occlusion. POSTERIOR CIRCULATION: Both vertebral arteries are widely patent to the vertebrobasilar junction without stenosis. Left vertebral artery slightly dominant. Neither PICA origin visualized. Basilar widely patent to its distal aspect without stenosis. Superior cerebral arteries patent bilaterally. Both PCAs primarily supplied via the basilar and are well perfused to their distal aspects. Small bilateral posterior communicating arteries noted. No intracranial aneurysm or other vascular abnormality. IMPRESSION: MRI HEAD IMPRESSION: 1. Limited exam due to the patient's inability to tolerate the full length of the study. Diffusion-weighted imaging only was performed. 2. Moderate-sized acute ischemic infarct involving the right frontoparietal region, posterior right MCA distribution. No associated mass effect. 3. No other definite acute intracranial abnormality. MRA HEAD IMPRESSION: Negative intracranial MRA. No large vessel occlusion or hemodynamically significant stenosis. No aneurysm. Electronically Signed   By: Rise Mu M.D.   On: 10/16/2019 19:02   VAS US CAROTID (at [facility] and [facility] only)  Result Date: 10/17/2019 Carotid Arterial Duplex Study Indications:       CVA. Risk Factors:      Current smoker. Comparison Study:  No prior studies. Performing Technologist: Jean Rosenthal  Examination Guidelines: A complete evaluation includes B-mode imaging, spectral Doppler, color Doppler, and power Doppler as needed of all accessible portions of each vessel. Bilateral testing is considered an  integral part of a complete examination. Limited examinations for reoccurring indications may be performed as noted.  Right Carotid Findings: +----------+--------+--------+--------+------------------+------------------+           PSV cm/sEDV cm/sStenosisPlaque DescriptionComments           +----------+--------+--------+--------+------------------+------------------+ CCA Prox  146     26                                                    +----------+--------+--------+--------+------------------+------------------+ CCA Distal148     25                                intimal thickening +----------+--------+--------+--------+------------------+------------------+ ICA Prox  152     18      1-39%   heterogenous                         +----------+--------+--------+--------+------------------+------------------+ ICA Distal85      23                                                   +----------+--------+--------+--------+------------------+------------------+ ECA       139     19                                                   +----------+--------+--------+--------+------------------+------------------+ +----------+--------+-------+----------------+-------------------+           PSV cm/sEDV cmsDescribe        Arm Pressure (mmHG) +----------+--------+-------+----------------+-------------------+ [id number]            Multiphasic, WNL                    +----------+--------+-------+----------------+-------------------+ +---------+--------+--+--------+--+---------+ VertebralPSV cm/s53EDV cm/s12Antegrade +---------+--------+--+--------+--+---------+  Left Carotid Findings: +----------+--------+--------+--------+------------------+------------------+           PSV cm/sEDV cm/sStenosisPlaque DescriptionComments           +----------+--------+--------+--------+------------------+------------------+ CCA Prox  169     28                                intimal thickening +----------+--------+--------+--------+------------------+------------------+ CCA Distal122     21                                intimal thickening +----------+--------+--------+--------+------------------+------------------+ ICA Prox  48      16      1-39%   heterogenous                         +----------+--------+--------+--------+------------------+------------------+  ICA Distal112     29                                                   +----------+--------+--------+--------+------------------+------------------+ ECA  143     18                                                   +----------+--------+--------+--------+------------------+------------------+ +----------+--------+--------+----------------+-------------------+           PSV cm/sEDV cm/sDescribe        Arm Pressure (mmHG) +----------+--------+--------+----------------+-------------------+ [id number]             Multiphasic, WNL                    +----------+--------+--------+----------------+-------------------+ +---------+--------+--+--------+--+---------+ VertebralPSV cm/s63EDV cm/s11Antegrade +---------+--------+--+--------+--+---------+   Summary: Right Carotid: Velocities in the right ICA are consistent with a 1-39% stenosis. Left Carotid: Velocities in the left ICA are consistent with a 1-39% stenosis. Vertebrals:  Bilateral vertebral arteries demonstrate antegrade flow. Subclavians: Normal flow hemodynamics were seen in bilateral subclavian              arteries. *See table(s) above for measurements and observations.  Electronically signed by Coral Else MD on 10/17/2019 at 9:32:25 PM.    Final      Medications:     Scheduled Medications: . aspirin  81 mg Oral Daily  . atorvastatin  40 mg Oral QHS  . digoxin  0.125 mg Oral Daily  . enoxaparin (LOVENOX) injection  40 mg Subcutaneous Q24H  . folic acid  1 mg Oral Daily  . furosemide  40 mg Oral Daily  . insulin aspart  0-5 Units Subcutaneous QHS  . insulin aspart  0-9 Units Subcutaneous TID WC  . losartan  25 mg Oral Daily  . multivitamin with minerals  1 tablet Oral Daily  . sodium chloride flush  3 mL Intravenous Q12H  . spironolactone  25 mg Oral Daily  . [START ON 10/24/2019] thiamine injection  100 mg Intravenous Daily    Infusions: . sodium chloride 10 mL/hr at 10/17/19 2317  . thiamine  injection      PRN Medications: sodium chloride, acetaminophen, LORazepam **OR** LORazepam, nicotine polacrilex, sodium chloride flush   Assessment/Plan:   1. Acute on Chronic Systolic HF - NICM, suspect ETOH - echo 5/20 EF 15%, RV moderately reduced - R/LHC 5/20 showed no CAD and preserved CO, CI 2.2  - now admitted w/ a/c CHF with concern for low output, NYHA Class IIIB, in the setting of poor med complaince and ongoing polysubstance abuse  - Echo reviewed personally EF 15-20% severe RV dysfunction. Mod MR - He refused all meds except ativan on 9/19.  - 9/21 had Rt MCA stroke  - stable from CHF standpoint. Volume status ok.  - Continue spiro, losartan, dig and lasix .   - No b-blocker yet with possible low output - refer to paramedicine once discharged  - not a candidate for advanced therapies given poor compliance and active substance use   2. DM2 - Hgb A1c 6.3  - Consider SGLT2i  3. Polysubstance abuse with possibe ETOH withdrawal - pt admits that he smokes half a pack cigarettes per day on average, drinks 3 to 4 "forties"per week on average, and last smoked "crack"approximately 3 days - primary team managing withdrawal symptoms   4. Chest Pain w/ minimally elevated Hs Troponin  - doubt ischemic - LHC 05/2018 showed no CAD  5. Hypomag - Resolved after mag supp.   6. Rt MCA Stroke 9/21 - neuro feels most likely  secondary to cardiomyopathy w/ low EF in setting of alcohol and cocaine use - no afib detection on tele. Consider implantable loop recorder for ongoing surveillance - per neuro, plan to start a/c in 7-10 days - continue ASA, statin and antihypertensive regimen - continues w/ speech deficit. Continue speech therapy - will need CIR     Length of Stay: 6   Brittainy Simmons NP-C  10/18/2019, 9:46 AM  Advanced Heart Failure Team Pager [phone] (M-F; 7a - 4p)  Please contact [facility] for night-coverage after hours (4p -7a ) and weekends on  [email or web address]  Patient seen and examined with the above-signed Advanced Practice Provider and/or Housestaff. I personally reviewed laboratory data, imaging studies and relevant notes. I independently examined the patient and formulated the important aspects of the plan. I have edited the note to reflect any of my changes or salient points. I have personally discussed the plan with the patient and/or family.  Remains aphasic and confused. HF stable. No AF  General: Aphasic confused HEENT: normal Neck: supple. no JVD. Carotids 2+ bilat; no bruits. No lymphadenopathy or thryomegaly appreciated. Cor: PMI nondisplaced. Regular rate & rhythm. No rubs, gallops or murmurs. Lungs: clear Abdomen: soft, nontender, nondistended. No hepatosplenomegaly. No bruits or masses. Good bowel sounds. Extremities: no cyanosis, clubbing, rash, edema Neuro: aphasic and confused. Will not follow commands  Stable from HF perspective. Continue current meds. Suspect CVA is cardio-embolic. Will defer to Neuro regarding anticoagulation.   The HF team will sign off. Please let us know prior to d/c and we can arrange outpatient f/u.   Arvilla Meres, MD  4:23 PM

## 2019-10-18 NOTE — Progress Notes (Signed)
Rehab Admissions Coordinator Note:  Patient was screened by Clois Dupes for appropriateness for an Inpatient Acute Rehab Consult per therapy recs. .  At this time, we are recommending Inpatient Rehab consult. I will place order per protocol.  Clois Dupes RN MSN 10/18/2019, 8:52 AM  I can be reached at [phone].

## 2019-10-18 NOTE — Progress Notes (Signed)
Inpatient Rehabilitation Admissions Coordinator  Inpatient rehab consult received. I met with patient at bedside with his Air cabin crew and RN at bedside. I have placed a call to his sister, Lannette Donath to clarify PLOF as well as caregiver supports available to assist with planning dispo options.  Danne Baxter, RN, MSN Rehab Admissions Coordinator [phone] 10/18/2019 2:13 PM

## 2019-10-18 NOTE — Progress Notes (Signed)
Inpatient Rehabilitation Admissions Coordinator  I met with patient's sister. Patient lives in a boarding house alone for the past 2 to 3 years. He does not have caregiver support for she works. He is schizophrenic and not managing his medications per sister. She is requesting SNF with eventual ALF placement. I have alerted TOC team and we will sign off at this time.  Danne Baxter, RN, MSN Rehab Admissions Coordinator [phone] 10/18/2019 3:56 PM

## 2019-10-18 NOTE — Progress Notes (Signed)
PROGRESS NOTE    Kerry Hamilton  JOI:[id number] DOB: [birth year] DOA: 10/11/2019 PCP: Medicine, [facility]   Chief Complaint  Patient presents with  . Chest Pain    Brief  Per chart: 54 year old man with EF 15%, DM 2 was admitted yesterday with shortness of breath and chest pain.  She had been drinking a lot and using crack for the last week.  Laboratory data was noted for decompensated heart failure and troponin around 25.Patient was treated with Lasix IV and seen by the advanced heart failure team.  On day 3 after admission patient was noted to be talking to himself and somewhat confused.  Patient had been on CIWA protocol and is thought to be in DTs. Ativan has been effective in controlling his agitation.  9/21: Continues to be confused, not following instruction moving upper extremities, CT head obtained that showed acute stroke and neurology was consulted and transferred to Neuro floor 3W. Neurology following, given his alcohol abuse letter cardiology question Wet [place] Menghini s/p high-dose IV thiamine.  Patient remains alert awake at times agitated needing sitter.  Completed stroke work-up.  Lab works cardiac duplex MRI brain MRA.  Subjective: Sister at the bedside.  One-to-one sitter at the bedside. Is alert awake not following instruction.  Intermittently moving his hands Sister reports he was a stuttering earlier when he was trying to talk to her. She would like to talk to Child psychotherapist as she wont be able to take care of upon discharge   Assessment & Plan:  Acute on chronic systolic CHF 2/2 Nonischemic cardiomyopathy, ?Wet Beri beri from [place] abuse/thiamine deficiency-echo 5/20 EF 15% right/left HC 5/20-no CAD and preserved cardiac output currently admitted with low output CHF with poor medical compliance and ongoing polysubstance abuse.Cardiology following closely EF showed 15 to 20% on the echo with severe RV dysfunction, moderate MR. Per cardiology continue Lasix 40 mg,  losartan, Lanoxin and Aldactone.   Confusion/acute metabolic encephalopathy: possible DTs, werneckie's? given his alcohol abuse-on high dose thiamine as per neuro. MRI w/ acute stroke.  Needed Ativan last night for agitation.Cont supportive care, reorientation, fall precautions.  Acute stroke:Neurology on board.lipdid panel LDL at 102,hba1c 6.3. MRA no LVO or stenosis. MRI limited- mod sized acute ischemic infarct rt frontoparietal region, post Rt MCA.  On aspirin 81, Lipitor.  Carotid duplex no acute finding, neurology following and has recommended- anticoagulation 7 to 10 days given severe cardiomyopathy w/ stroke( defer to Neuro)  Polysubstance abuse:with cocaine, etoh.Patient sister confirmed that patient uses drugs and drinks alcohol.   Diabetes mellitus type 2, noninsulin dependent. Controlled hba1c 6.3, blood sugar is stable continue sliding scale and monitor.   Recent Labs  Lab 10/17/19 0829 10/17/19 1156 10/17/19 1551 10/17/19 2112 10/18/19 0624  GLUCAP 206* 137* 163* 111* 86    Elevated troponin/chest pain, troponin mildly elevated: No further plan per cardiology likely demand ischemia.  He had stable cardiac cath in 2020  Mild transaminitis abdomen ultrasound unrevealing, LFTs have improved. Recent Labs  Lab 10/12/19 1130 10/17/19 0435  AST 37 31  ALT 108* 51*  ALKPHOS 147* 111  BILITOT 0.6 0.5  PROT 6.3* 7.3  ALBUMIN 3.3* 3.5  INR 1.2  --    DVT prophylaxis: enoxaparin (LOVENOX) injection 40 mg Start: 10/12/19 1600 Code Status:   Code Status: Full Code   Family Communication: plan of care discussed with patient and sister over the phone.  Discussed again at the bedside today.  Status is: Inpatient Remains inpatient appropriate  because:Acute stroke, confusion, acute systolic CHF Dispo: The patient is from: Home              Anticipated d/c is to: cir consulted              Anticipated d/c date is: > 3 days              Patient currently is not medically stable to  d/c. Nutrition: Diet Order            Diet Carb Modified Fluid consistency: Thin; Room service appropriate? No  Diet effective now                 Body mass index is 22.28 kg/m. Consultants:see note  Procedures: TTE 9/17 1. Left ventricular ejection fraction, by estimation, is 15-20%. The left  ventricle has severely decreased function. The left ventricle demonstrates  global hypokinesis. The left ventricular internal cavity size was severely  dilated. Left ventricular  diastolic parameters are consistent with Grade III diastolic dysfunction  (restrictive). Elevated left atrial pressure.  2. Right ventricular systolic function is severely reduced. The right  ventricular size is severely enlarged. There is moderately elevated  pulmonary artery systolic pressure. The estimated right ventricular  systolic pressure is 54.8 mmHg.  3. Left atrial size was severely dilated.  4. Right atrial size was moderately dilated.  5. Severe mitral regurgitation. Restricted PMVL in systole due to dilated  cardiomyopathy (IIIb). The mitral valve is abnormal. Moderate to severe  mitral valve regurgitation.  6. Tricuspid valve regurgitation is moderate to severe.  7. The aortic valve is tricuspid. Aortic valve regurgitation is not  visualized. No aortic stenosis is present.  8. The inferior vena cava is normal in size with <50% respiratory  variability, suggesting right atrial pressure of 8 mmHg  MRI HEAD IMPRESSION: 9/21  1. Limited exam due to the patient's inability to tolerate the full length of the study. Diffusion-weighted imaging only was performed. 2. Moderate-sized acute ischemic infarct involving the right frontoparietal region, posterior right MCA distribution. No associated mass effect. 3. No other definite acute intracranial abnormality.  MRA HEAD IMPRESSION: 9/21  Negative intracranial MRA. No large vessel occlusion or hemodynamically significant stenosis. No  aneurysm  Microbiology:see note Blood Culture    Component Value Date/Time   SDES  06/10/2018 1803    BLOOD RIGHT FOREARM Performed at [facility], [address] [place], [place] [address]    SPECREQUEST  06/10/2018 1803    BOTTLES DRAWN AEROBIC ONLY Blood Culture results may not be optimal due to an inadequate volume of blood received in culture bottles Performed at [facility], [address], [place], [place] [address]    CULT  06/10/2018 1803    NO GROWTH 5 DAYS Performed at [facility], [address], [place], [place] [address]    REPTSTATUS 06/16/2018 FINAL 06/10/2018 1803    Other culture-see note  Medications: Scheduled Meds: . aspirin  81 mg Oral Daily  . atorvastatin  40 mg Oral QHS  . digoxin  0.125 mg Oral Daily  . enoxaparin (LOVENOX) injection  40 mg Subcutaneous Q24H  . folic acid  1 mg Oral Daily  . furosemide  40 mg Oral Daily  . insulin aspart  0-5 Units Subcutaneous QHS  . insulin aspart  0-9 Units Subcutaneous TID WC  . losartan  25 mg Oral Daily  . multivitamin with minerals  1 tablet Oral Daily  . sodium chloride flush  3 mL Intravenous Q12H  . spironolactone  25 mg Oral Daily  . [START ON 10/24/2019] thiamine injection  100 mg Intravenous Daily   Continuous Infusions: . sodium chloride 10 mL/hr at 10/17/19 2317  . thiamine injection      Antimicrobials: Anti-infectives (From admission, onward)   None     Objective: Vitals: Today's Vitals   10/17/19 2101 10/18/19 0007 10/18/19 0351 10/18/19 0851  BP: 104/70 102/75 108/70 104/77  Pulse: (!) 53 67 (!) 58 61  Resp: Temp: 98.2 F (36.8 C) 97.7 F (36.5 C) 97.7 F (36.5 C) (!) 97.5 F (36.4 C)  TempSrc: Oral  Oral Oral  SpO2: 99% 99% 96% 100%  Weight:      Height:      PainSc:    0-No pain    Intake/Output Summary (Last 24 hours) at 10/18/2019 0942 Last data filed at 10/17/2019 2254 Gross per 24 hour  Intake --  Output 1125 ml  Net  -1125 ml   Filed Weights   10/15/19 0430 10/16/19 0437 10/17/19 0500  Weight: 76.2 kg 74.3 kg 74.5 kg   Weight change:   Intake/Output from previous day: 09/22 0701 - 09/23 0700 In: 180 [P.O.:180] Out: 1125 [Urine:1125] Intake/Output this shift: No intake/output data recorded.  Examination:  General exam: AA, stutters when trying to talk, not following instruction, weak appearing. HEENT:Oral mucosa moist, Ear/Nose WNL grossly, dentition normal. Respiratory system: bilaterally clear no wheezing or crackles,no use of accessory muscle Cardiovascular system: S1 & S2 +, No JVD,. Gastrointestinal system: Abdomen soft, NT,ND, BS+ Nervous System:Alert, awake, moving all extremities on his own but not following instruction.   Extremities: No edema, distal peripheral pulses palpable.  Skin: No rashes,no icterus. MSK: Normal muscle bulk,tone, power  Data Reviewed: I have personally reviewed following labs and imaging studies CBC: Recent Labs  Lab 10/11/19 1645 10/13/19 0600 10/14/19 1248 10/17/19 0435  WBC 4.6 4.2 4.1 4.3  HGB 12.0* 12.1* 13.5 15.9  HCT 39.5 38.3* 43.6 49.5  MCV 89.4 85.5 87.4 87.0  PLT 210 208 244 224   Basic Metabolic Panel: Recent Labs  Lab 10/13/19 0600 10/14/19 1248 10/15/19 0633 10/16/19 0435 10/17/19 0435  NA 139 136 137 136 135  K 3.6 4.4 4.5 4.3 4.4  CL 106 106 107 103 102  CO2 GLUCOSE 84 102* 98 87 104*  BUN CREATININE 1.13 1.13 1.28* 1.18 1.15  CALCIUM 8.4* 8.7* 9.1 9.3 9.5  MG 1.5* 2.2  --   --   --   PHOS 3.6  --   --   --   --    GFR: Estimated Creatinine Clearance: 78.3 mL/min (by C-G formula based on SCr of 1.15 mg/dL). Liver Function Tests: Recent Labs  Lab 10/12/19 1130 10/17/19 0435  AST 37 31  ALT 108* 51*  ALKPHOS 147* 111  BILITOT 0.6 0.5  PROT 6.3* 7.3  ALBUMIN 3.3* 3.5   Recent Labs  Lab 10/12/19 1130  LIPASE 69*   No results for input(s): AMMONIA in the last 168  hours. Coagulation Profile: Recent Labs  Lab 10/12/19 1130  INR 1.2   Cardiac Enzymes: No results for input(s): CKTOTAL, CKMB, CKMBINDEX, TROPONINI in the last 168 hours. BNP (last 3 results) No results for input(s): PROBNP in the last 8760 hours. HbA1C: Recent Labs    10/17/19 0435  HGBA1C 6.3*   CBG: Recent Labs  Lab 10/17/19 [id number]  10/17/19 1156 10/17/19 1551 10/17/19 2112 10/18/19 0624  GLUCAP 206* 137* 163* 111* 86   Lipid Profile: Recent Labs    10/17/19 0435  CHOL 170  HDL 50  LDLCALC 102*  TRIG 88  CHOLHDL 3.4   Thyroid Function Tests: No results for input(s): TSH, T4TOTAL, FREET4, T3FREE, THYROIDAB in the last 72 hours. Anemia Panel: No results for input(s): VITAMINB12, FOLATE, FERRITIN, TIBC, IRON, RETICCTPCT in the last 72 hours. Sepsis Labs: No results for input(s): PROCALCITON, LATICACIDVEN in the last 168 hours.  Recent Results (from the past 240 hour(s))  SARS Coronavirus 2 by RT PCR (hospital order, performed in [facility] hospital lab) Nasopharyngeal Nasopharyngeal Swab     Status: None   Collection Time: 10/11/19  4:45 PM   Specimen: Nasopharyngeal Swab  Result Value Ref Range Status   SARS Coronavirus 2 NEGATIVE NEGATIVE Final    Comment: (NOTE) SARS-CoV-2 target nucleic acids are NOT DETECTED.  The SARS-CoV-2 RNA is generally detectable in upper and lower respiratory specimens during the acute phase of infection. The lowest concentration of SARS-CoV-2 viral copies this assay can detect is 250 copies / mL. A negative result does not preclude SARS-CoV-2 infection and should not be used as the sole basis for treatment or other patient management decisions.  A negative result may occur with improper specimen collection / handling, submission of specimen other than nasopharyngeal swab, presence of viral mutation(s) within the areas targeted by this assay, and inadequate number of viral copies (<250 copies / mL). A negative result must be  combined with clinical observations, patient history, and epidemiological information.  Fact Sheet for Patients:   BoilerBrush.com.cy  Fact Sheet for Healthcare Providers: https://pope.com/  This test is not yet approved or  cleared by the [place] FDA and has been authorized for detection and/or diagnosis of SARS-CoV-2 by FDA under an Emergency Use Authorization (EUA).  This EUA will remain in effect (meaning this test can be used) for the duration of the COVID-19 declaration under Section 564(b)(1) of the Act, 21 U.S.C. section 360bbb-3(b)(1), unless the authorization is terminated or revoked sooner.  Performed at [facility], [address], [place], [place] [address]      Radiology Studies: CT HEAD WO CONTRAST  Result Date: 10/16/2019 CLINICAL DATA:  Mental status change. EXAM: CT HEAD WITHOUT CONTRAST TECHNIQUE: Contiguous axial images were obtained from the base of the skull through the vertex without intravenous contrast. COMPARISON:  10/04/2018 FINDINGS: Brain: Loss of gray-white differentiation and cytotoxic edema involving the posterior right frontal and parietal lobes (MCA territory). Mild mass effect, including sulcal effacement. No midline shift. Basal cisterns are patent. No mass occupying hemorrhagic transformation. No hydrocephalus. Partially empty sella. Vascular: Calcific intracranial atherosclerosis. Skull: Normal. Negative for fracture or focal lesion. Sinuses/Orbits: No acute finding. Other: No mastoid effusions. IMPRESSION: 1. Findings consistent with an acute/subacute right posterior MCA territory infarct. MRI could further characterize, if clinically indicated. 2. Mild local mass effect without midline shift. No mass occupying hemorrhagic transformation. These results will be called to the ordering clinician or representative by the Radiologist Assistant, and communication documented in the PACS or [employer]. Electronically Signed   By: Feliberto Harts MD   On: 10/16/2019 11:21   MR ANGIO HEAD WO CONTRAST  Result Date: 10/16/2019 CLINICAL DATA:  Initial evaluation for neuro deficit, stroke suspected. EXAM: MRI HEAD WITHOUT CONTRAST MRA HEAD WITHOUT CONTRAST TECHNIQUE: Multiplanar, multiecho pulse sequences of the brain and surrounding structures were obtained without intravenous  contrast. Angiographic images of the head were obtained using MRA technique without contrast. COMPARISON:  Prior CT from earlier the same day. FINDINGS: MRI HEAD FINDINGS Brain: Examination is markedly limited as the patient was unable to tolerate the full length of the study. Axial and coronal DWI sequences only were performed. Diffusion-weighted sequence demonstrates a confluent area of restricted diffusion involving the posterior right frontoparietal region, consistent with an acute posterior right MCA territory infarct. Mild involvement of the posterior right insula, with extension to involve the right periatrial white matter. No significant regional mass effect. No obvious evidence for associated hemorrhage, although evaluation for this is markedly limited on this exam. No other diffusion abnormality to suggest acute or subacute ischemia seen elsewhere within the brain. Gray-white matter differentiation otherwise maintained. No other mass effect or midline shift. No obvious mass lesion. Ventricles normal size without hydrocephalus. No visible extra-axial fluid collection. Vascular: Not assessed on this limited exam. Skull and upper cervical spine: Not assessed on this limited exam. Sinuses/Orbits: Not assessed on this limited exam. Other: None. MRA HEAD FINDINGS ANTERIOR CIRCULATION: Visualized distal cervical segments of the internal carotid arteries widely patent with symmetric antegrade flow. Petrous, cavernous, and supraclinoid segments widely patent without stenosis or other abnormality. A1 segments patent bilaterally.  Normal anterior communicating artery complex. Anterior cerebral arteries widely patent to their distal aspects without stenosis. No M1 stenosis or occlusion. Left M1 bifurcates early. Bifurcations M cells are within normal limits. No visible proximal MCA branch occlusion. POSTERIOR CIRCULATION: Both vertebral arteries are widely patent to the vertebrobasilar junction without stenosis. Left vertebral artery slightly dominant. Neither PICA origin visualized. Basilar widely patent to its distal aspect without stenosis. Superior cerebral arteries patent bilaterally. Both PCAs primarily supplied via the basilar and are well perfused to their distal aspects. Small bilateral posterior communicating arteries noted. No intracranial aneurysm or other vascular abnormality. IMPRESSION: MRI HEAD IMPRESSION: 1. Limited exam due to the patient's inability to tolerate the full length of the study. Diffusion-weighted imaging only was performed. 2. Moderate-sized acute ischemic infarct involving the right frontoparietal region, posterior right MCA distribution. No associated mass effect. 3. No other definite acute intracranial abnormality. MRA HEAD IMPRESSION: Negative intracranial MRA. No large vessel occlusion or hemodynamically significant stenosis. No aneurysm. Electronically Signed   By: Rise Mu M.D.   On: 10/16/2019 19:02   MR BRAIN WO CONTRAST  Result Date: 10/16/2019 CLINICAL DATA:  Initial evaluation for neuro deficit, stroke suspected. EXAM: MRI HEAD WITHOUT CONTRAST MRA HEAD WITHOUT CONTRAST TECHNIQUE: Multiplanar, multiecho pulse sequences of the brain and surrounding structures were obtained without intravenous contrast. Angiographic images of the head were obtained using MRA technique without contrast. COMPARISON:  Prior CT from earlier the same day. FINDINGS: MRI HEAD FINDINGS Brain: Examination is markedly limited as the patient was unable to tolerate the full length of the study. Axial and coronal  DWI sequences only were performed. Diffusion-weighted sequence demonstrates a confluent area of restricted diffusion involving the posterior right frontoparietal region, consistent with an acute posterior right MCA territory infarct. Mild involvement of the posterior right insula, with extension to involve the right periatrial white matter. No significant regional mass effect. No obvious evidence for associated hemorrhage, although evaluation for this is markedly limited on this exam. No other diffusion abnormality to suggest acute or subacute ischemia seen elsewhere within the brain. Gray-white matter differentiation otherwise maintained. No other mass effect or midline shift. No obvious mass lesion. Ventricles normal size without hydrocephalus. No visible extra-axial fluid collection.  Vascular: Not assessed on this limited exam. Skull and upper cervical spine: Not assessed on this limited exam. Sinuses/Orbits: Not assessed on this limited exam. Other: None. MRA HEAD FINDINGS ANTERIOR CIRCULATION: Visualized distal cervical segments of the internal carotid arteries widely patent with symmetric antegrade flow. Petrous, cavernous, and supraclinoid segments widely patent without stenosis or other abnormality. A1 segments patent bilaterally. Normal anterior communicating artery complex. Anterior cerebral arteries widely patent to their distal aspects without stenosis. No M1 stenosis or occlusion. Left M1 bifurcates early. Bifurcations M cells are within normal limits. No visible proximal MCA branch occlusion. POSTERIOR CIRCULATION: Both vertebral arteries are widely patent to the vertebrobasilar junction without stenosis. Left vertebral artery slightly dominant. Neither PICA origin visualized. Basilar widely patent to its distal aspect without stenosis. Superior cerebral arteries patent bilaterally. Both PCAs primarily supplied via the basilar and are well perfused to their distal aspects. Small bilateral posterior  communicating arteries noted. No intracranial aneurysm or other vascular abnormality. IMPRESSION: MRI HEAD IMPRESSION: 1. Limited exam due to the patient's inability to tolerate the full length of the study. Diffusion-weighted imaging only was performed. 2. Moderate-sized acute ischemic infarct involving the right frontoparietal region, posterior right MCA distribution. No associated mass effect. 3. No other definite acute intracranial abnormality. MRA HEAD IMPRESSION: Negative intracranial MRA. No large vessel occlusion or hemodynamically significant stenosis. No aneurysm. Electronically Signed   By: Rise Mu M.D.   On: 10/16/2019 19:02   VAS US CAROTID (at [facility] only)  Result Date: 10/17/2019 Carotid Arterial Duplex Study Indications:       CVA. Risk Factors:      Current smoker. Comparison Study:  No prior studies. Performing Technologist: Jean Rosenthal  Examination Guidelines: A complete evaluation includes B-mode imaging, spectral Doppler, color Doppler, and power Doppler as needed of all accessible portions of each vessel. Bilateral testing is considered an integral part of a complete examination. Limited examinations for reoccurring indications may be performed as noted.  Right Carotid Findings: +----------+--------+--------+--------+------------------+------------------+           PSV cm/sEDV cm/sStenosisPlaque DescriptionComments           +----------+--------+--------+--------+------------------+------------------+ CCA Prox  146     26                                                   +----------+--------+--------+--------+------------------+------------------+ CCA Distal148     25                                intimal thickening +----------+--------+--------+--------+------------------+------------------+ ICA Prox  152     18      1-39%   heterogenous                         +----------+--------+--------+--------+------------------+------------------+ ICA  Distal85      23                                                   +----------+--------+--------+--------+------------------+------------------+ ECA       139     19                                                   +----------+--------+--------+--------+------------------+------------------+ +----------+--------+-------+----------------+-------------------+  PSV cm/sEDV cmsDescribe        Arm Pressure (mmHG) +----------+--------+-------+----------------+-------------------+ [id number]            Multiphasic, WNL                    +----------+--------+-------+----------------+-------------------+ +---------+--------+--+--------+--+---------+ VertebralPSV cm/s53EDV cm/s12Antegrade +---------+--------+--+--------+--+---------+  Left Carotid Findings: +----------+--------+--------+--------+------------------+------------------+           PSV cm/sEDV cm/sStenosisPlaque DescriptionComments           +----------+--------+--------+--------+------------------+------------------+ CCA Prox  169     28                                intimal thickening +----------+--------+--------+--------+------------------+------------------+ CCA Distal122     21                                intimal thickening +----------+--------+--------+--------+------------------+------------------+ ICA Prox  48      16      1-39%   heterogenous                         +----------+--------+--------+--------+------------------+------------------+ ICA Distal112     29                                                   +----------+--------+--------+--------+------------------+------------------+ ECA       143     18                                                   +----------+--------+--------+--------+------------------+------------------+ +----------+--------+--------+----------------+-------------------+           PSV cm/sEDV cm/sDescribe        Arm Pressure  (mmHG) +----------+--------+--------+----------------+-------------------+ [id number]             Multiphasic, WNL                    +----------+--------+--------+----------------+-------------------+ +---------+--------+--+--------+--+---------+ VertebralPSV cm/s63EDV cm/s11Antegrade +---------+--------+--+--------+--+---------+   Summary: Right Carotid: Velocities in the right ICA are consistent with a 1-39% stenosis. Left Carotid: Velocities in the left ICA are consistent with a 1-39% stenosis. Vertebrals:  Bilateral vertebral arteries demonstrate antegrade flow. Subclavians: Normal flow hemodynamics were seen in bilateral subclavian              arteries. *See table(s) above for measurements and observations.  Electronically signed by Coral Else MD on 10/17/2019 at 9:32:25 PM.    Final      LOS: 6 days   Lanae Boast, MD [facility]  10/18/2019, 9:42 AM

## 2019-10-19 DIAGNOSIS — I5023 Acute on chronic systolic (congestive) heart failure: Secondary | ICD-10-CM | POA: Diagnosis not present

## 2019-10-19 LAB — CBC
HCT: 48.6 % (ref 39.0–52.0)
Hemoglobin: 15.5 g/dL (ref 13.0–17.0)
MCH: 27.1 pg (ref 26.0–34.0)
MCHC: 31.9 g/dL (ref 30.0–36.0)
MCV: 84.8 fL (ref 80.0–100.0)
Platelets: 253 10*3/uL (ref 150–400)
RBC: 5.73 MIL/uL (ref 4.22–5.81)
RDW: 12.6 % (ref 11.5–15.5)
WBC: 3.5 10*3/uL — ABNORMAL LOW (ref 4.0–10.5)
nRBC: 0 % (ref 0.0–0.2)

## 2019-10-19 LAB — COMPREHENSIVE METABOLIC PANEL
ALT: 47 U/L — ABNORMAL HIGH (ref 0–44)
AST: 32 U/L (ref 15–41)
Albumin: 3.6 g/dL (ref 3.5–5.0)
Alkaline Phosphatase: 116 U/L (ref 38–126)
Anion gap: 11 (ref 5–15)
BUN: 23 mg/dL — ABNORMAL HIGH (ref 6–20)
CO2: 23 mmol/L (ref 22–32)
Calcium: 9.4 mg/dL (ref 8.9–10.3)
Chloride: 100 mmol/L (ref 98–111)
Creatinine, Ser: 1.28 mg/dL — ABNORMAL HIGH (ref 0.61–1.24)
GFR calc Af Amer: >60 mL/min (ref >60)
GFR calc non Af Amer: >60 mL/min (ref >60)
Glucose, Bld: 101 mg/dL — ABNORMAL HIGH (ref 70–99)
Potassium: 4.4 mmol/L (ref 3.5–5.1)
Sodium: 134 mmol/L — ABNORMAL LOW (ref 135–145)
Total Bilirubin: 0.8 mg/dL (ref 0.3–1.2)
Total Protein: 7.4 g/dL (ref 6.5–8.1)

## 2019-10-19 LAB — GLUCOSE, CAPILLARY
Glucose-Capillary: 102 mg/dL — ABNORMAL HIGH (ref 70–99)
Glucose-Capillary: 237 mg/dL — ABNORMAL HIGH (ref 70–99)
Glucose-Capillary: 99 mg/dL (ref 70–99)
Glucose-Capillary: 129 mg/dL — ABNORMAL HIGH (ref 70–99)

## 2019-10-19 NOTE — Consult Note (Addendum)
  Patient is a 54 year old male admitted September 16 with shortness of breath and chest pain, patient with history of alcohol and cocaine use for the past week.  Psych consult was placed due to impulsive behaviors, history of schizophrenia, new confusion and stroke.  On evaluation patient open eyes, then drifted back to sleep and would not respond.  Patient would not agree to participate and psychiatric evaluation.  Writer also multiple verbal prompts in order to get patient to engage during the evaluation, however he continued to rest with his eyes closed.  Chart reviewed from attending provider patient was more alert and awake this morning, however has been impulsive needing a Posey at nighttime.  Staff also reported that patient is able to walk to the bathroom, however once he is there he is unaware of what to do next.  It is suspected that patient have acute metabolic encephalopathy possibly due to delirium tremens, Warnicke's given his alcohol abuse.  His MRI also showed acute stroke.  His history was obtained by his sister who reports history of schizophrenia and has not been compliant on his medication.  Chart review also indicates patient is currently having a CHF exacerbation, with a history of nonischemic cardiomyopathy, current EF 15 to 20% with severe right ventricular dysfunction, moderate mitral regurgitation.  Will agree with current treatment plan to include Ativan detox, and CIWA protocol.  However due to patient's current cardiac condition will limit use of antipsychotics unless absolutely necessary.  Last EKG obtained on September 16 QTC of 480.  Patient does not appear to be acutely psychotic, delusional, and or having hallucinations.  Therefore again will limit use of antipsychotics at this time until he is medically stable to resume his home medication.  UDS obtained was negative.  No additional toxicology levels were drawn unaware of patient's EtOH level on admission.  Due to recent stroke,  there may be some concerns for post CVA agitation and delirium, if this presents may consider adding low-dose antidepressant to treat the symptoms.  Will need an additional evaluation once patient is medically stable and able to participate and evaluation.  Will keep patient on consult list at this time, as he is unable to participate in today's assessment.

## 2019-10-19 NOTE — Progress Notes (Addendum)
Physical Therapy Treatment Patient Details Name: Kerry Hamilton MRN: [id number] DOB: 06/21/1965 Today's Date: 10/19/2019    History of Present Illness Pt is a 54 y.o. male admitted 10/11/19 with SOB and chest pain; chart states pt with ETOH and cocaine use for the past week. Workup for acute on chronic CHF secondary to nonischemic cardiomyopathy, suspect ETOH abuse. Worsening confusion noted 9/20-9/21; MRI with acute, moderate-sized infarct in R frontoparietal region. PMH includes substance abuse, DM2, CAD, depression.    PT Comments    Pt very flat during treatment, minimal interaction with therapist and does not follow commands. He was able to progress OOB with mod A +2. Pt will not initiate movement despite multimodal cues; however once therapist and tech initiated movement pt would activate muscles to participate. At this time CIR has denied pt as he has no caregiver support at d/c. Updated d/c recs for SNF placement as pt will need SNF level care to maximize functional independence and safety with mobility.     Follow Up Recommendations  SNF;Supervision/Assistance - 24 hour     Equipment Recommendations  Other (comment) (TBD)    Recommendations for Other Services       Precautions / Restrictions Precautions Precautions: Fall    Mobility  Bed Mobility Overal bed mobility: Needs Assistance Bed Mobility: Supine to Sit     Supine to sit: Mod assist;HOB elevated     General bed mobility comments: Pt does not initiate movement despite multimodal cues. Once therapist progressed LEs and began elevating trunk, pt's muscles activated to assist.   Transfers Overall transfer level: Needs assistance Equipment used: 2 person hand held assist Transfers: Squat Pivot Transfers     Squat pivot transfers: Mod assist;+2 safety/equipment     General transfer comment: Pt did not activate LEs when therapist and tech attempted assist pt with standing. Instead performed squat pivot to recliner  chair. Pt does not initiate movement, but does assist once movement is initiated by therapy.   Ambulation/Gait             General Gait Details: not following commands or assisting enough to attempt today.    Stairs             Wheelchair Mobility    Modified Rankin (Stroke Patients Only) Modified Rankin (Stroke Patients Only) Pre-Morbid Rankin Score: No symptoms Modified Rankin: Moderately severe disability     Balance Overall balance assessment: Needs assistance   Sitting balance-Leahy Scale: Fair       Standing balance-Leahy Scale: Poor Standing balance comment: Reliant on HHA or external assist to maintain static standing                            Cognition Arousal/Alertness: Awake/alert Behavior During Therapy: Flat affect Overall Cognitive Status: No family/caregiver present to determine baseline cognitive functioning                                 General Comments: Minimal interaction with therapist. Word-finding difficulty. difficult to determine true cognitive impairment (receptive/expressive difficulties?) vs. desire to interact with PT/OT      Exercises      General Comments        Pertinent Vitals/Pain Pain Assessment: Faces Faces Pain Scale: Hurts a little bit Pain Location: IV site R forearm Pain Descriptors / Indicators: Grimacing Pain Intervention(s): Monitored during session;Limited activity within patient's tolerance;Repositioned  Home Living                      Prior Function            PT Goals (current goals can now be found in the care plan section) Acute Rehab PT Goals Patient Stated Goal: not stated PT Goal Formulation: With patient Time For Goal Achievement: 10/31/19 Potential to Achieve Goals: Fair Progress towards PT goals: Progressing toward goals    Frequency    Min 3X/week      PT Plan Discharge plan needs to be updated;Frequency needs to be updated     Co-evaluation              AM-PAC PT "6 Clicks" Mobility   Outcome Measure  Help needed turning from your back to your side while in a flat bed without using bedrails?: A Little Help needed moving from lying on your back to sitting on the side of a flat bed without using bedrails?: A Lot Help needed moving to and from a bed to a chair (including a wheelchair)?: A Lot Help needed standing up from a chair using your arms (e.g., wheelchair or bedside chair)?: A Lot Help needed to walk in hospital room?: A Lot Help needed climbing 3-5 steps with a railing? : A Lot 6 Click Score: 13    End of Session Equipment Utilized During Treatment: Gait belt Activity Tolerance: Other (comment) (pt may not want to participate or cognition is limiting.) Patient left: with call bell/phone within reach;with restraints reapplied;in chair;with chair alarm set Nurse Communication: Mobility status;Other (comment) (sitting up with posy belt on) PT Visit Diagnosis: Other abnormalities of gait and mobility (R26.89);Muscle weakness (generalized) (M62.81);Other symptoms and signs involving the nervous system (R29.898)     Time: 1937-9024 PT Time Calculation (min) (ACUTE ONLY): 24 min  Charges:  $Therapeutic Activity: 23-37 mins                     Kallie Locks, [place] Pager [phone] Acute Rehab  Sheral Apley 10/19/2019, 12:41 PM

## 2019-10-19 NOTE — TOC Initial Note (Signed)
Transition of Care [facility]) - Initial/Assessment Note    Patient Details  Name: Kerry Hamilton MRN: [id number] Date of Birth: Mar 14, 1965  Transition [facility] CM/SW Contact:    Geralynn Ochs, LCSW Phone Number: 10/19/2019, 4:24 PM  Clinical Narrative:      CSW met with patient's sister at bedside to discuss need for SNF placement. Sister was concerned that the patient would be released back to his boarding house, and CSW explained that the patient is really not safe to be on his own, so we could not do that. Sister appreciative of information, said that the patient really needs to be in a nursing home long term. CSW discussed barriers, including Medicaid and his substance abuse history, and sister understood. Patient has been faxed out with no bed offers at this time. CSW to follow.             Expected Discharge Plan: Skilled Nursing Facility Barriers to Discharge: Continued Medical Work up, Active Substance Use - Placement, Awaiting State Approval (PASRR), Inadequate or no insurance, SNF Pending bed offer, Facility will not accept until restraint criteria met   Patient Goals and CMS Choice Patient states their goals for this hospitalization and ongoing recovery are:: patient unable to participate in goal setting due to disorientation CMS Medicare.gov Compare Post Acute Care list provided to:: Patient Represenative (must comment) Choice offered to / list presented to : Sibling  Expected Discharge Plan and Services Expected Discharge Plan: [place] Acute Care Choice: [place] Living arrangements for the past 2 months: [place]                                      Prior Living Arrangements/Services Living arrangements for the past 2 months: [place] with:: Self Patient language and need for interpreter reviewed:: No        Need for Family Participation in Patient Care: Yes (Comment) Care giver support system in  place?: No (comment)   Criminal Activity/Legal Involvement Pertinent to Current Situation/Hospitalization: No - Comment as needed  Activities of Daily Living Home Assistive Devices/Equipment: None ADL Screening (condition at time of admission) Patient's cognitive ability adequate to safely complete daily activities?: Yes Is the patient deaf or have difficulty hearing?: No Does the patient have difficulty seeing, even when wearing glasses/contacts?: No Does the patient have difficulty concentrating, remembering, or making decisions?: No Patient able to express need for assistance with ADLs?: Yes Does the patient have difficulty dressing or bathing?: No Independently performs ADLs?: Yes (appropriate for developmental age) Does the patient have difficulty walking or climbing stairs?: No Weakness of Legs: None Weakness of Arms/Hands: None  Permission Sought/Granted Permission sought to share information with : Facility Sport and exercise psychologist, Family Supports Permission granted to share information with : Yes, Verbal Permission Granted  Share Information with NAME: Lannette Donath  Permission granted to share info w AGENCY: SNF  Permission granted to share info w Relationship: Sister     Emotional Assessment   Attitude/Demeanor/Rapport: Unable to Assess Affect (typically observed): Unable to Assess   Alcohol / Substance Use: Illicit Drugs Psych Involvement: No (comment)  Admission diagnosis:  Acute on chronic systolic CHF (congestive heart failure) (HCC) [I50.23] Acute on chronic congestive heart failure, unspecified heart failure type [facility]) [I50.9] Patient Active Problem List   Diagnosis Date Noted  . Cerebral embolism with cerebral infarction 10/17/2019  .  Schizoaffective disorder ([place]) 10/05/2018  . Acute CHF (congestive heart failure) ([place]) 08/01/2018  . Elevated liver enzymes 08/01/2018  . Acute on chronic systolic CHF (congestive heart failure) ([place]) 07/13/2018  . ARF (acute  renal failure) ([place]) 07/13/2018  . Acute systolic CHF (congestive heart failure) ([place])   . Elevated troponin   . Shortness of breath   . LFT elevation   . Prolonged QT interval   . Polysubstance abuse ([place])   . ACS (acute coronary syndrome) ([place]) 06/10/2018  . Diabetes mellitus type 2, noninsulin dependent ([place]) 06/10/2018  . Depression 06/10/2018   PCP:  Medicine, [facility]:   [facility] [place], [place] - [address] [place] [address] Phone: [phone] Fax: [place], [place] - [address] [place] [address] Phone: [phone] Fax: [place], [place] - [address] [facility] [address] [place] [address] Phone: [phone] Fax: [phone]     Social Determinants of Health ([place]) Interventions    Readmission Risk Interventions Readmission Risk Prevention Plan 08/02/2018  PCP or Specialist Appt within 5-7 Days Complete  Home Care Screening Complete  Medication Review (RN CM) Complete  Some recent data might be hidden

## 2019-10-19 NOTE — Progress Notes (Addendum)
PROGRESS NOTE    Kerry Hamilton  UXL:[id number] DOB: 04-Aug-1965 DOA: 10/11/2019 PCP: Medicine, [facility]   Chief Complaint  Patient presents with  . Chest Pain    Brief  Per chart: 54 year old man with EF 15%, DM 2 was admitted yesterday with shortness of breath and chest pain.  She had been drinking a lot and using crack for the last week.  Laboratory data was noted for decompensated heart failure and troponin around 25.Patient was treated with Lasix IV and seen by the advanced heart failure team.  On day 3 after admission patient was noted to be talking to himself and somewhat confused.  Patient had been on CIWA protocol and is thought to be in DTs. Ativan has been effective in controlling his agitation.  9/21:Continues to be confused, not following instruction moving upper extremities, CT head obtained that showed acute stroke and neurology was consulted and transferred to Neuro floor 3W. Neurology following, given his alcohol abuse letter cardiology question wet [place] Menghini s/p high-dose IV thiamine.  Patient remains alert awake at times agitated needing sitter.  Completed stroke work-up.Lab works cardiac duplex MRI brain MRA.  Subjective: Seen this morning is alert awake. Has been impulsive needing Posey at night. Nursing reports he is able to walk with assist to the bathroom but does not know what to do when he goes the bathroom and needs assistance to help him void  Sister is not at the bedside today  Assessment & Plan:  Acute on chronic systolic CHF 2/2 Nonischemic cardiomyopathy, ?Wet Beri beri from [place] abuse/thiamine deficiency-echo 5/20 EF 15% right/left HC 5/20-no CAD and preserved cardiac output currently admitted with low output CHF with poor medical compliance and ongoing polysubstance abuse.Cardiology following closely EF showed 15 to 20% on the echo with severe RV dysfunction, moderate MR. Appreciate cardiology input, not a candidate for advanced therapies given  his poor compliance and active substance use.  Continue on Lasix 40 mg, Lanoxin, losartan and Aldactone.  Not on beta-blocker due to low EF.    Acute metabolic encephalopathy/ possible DTs, werneckie's? given his alcohol abuse- S/P high dose thiamine as per neuro. MRI w/ acute stroke.  Not needed Ativan.  Sister reports a history of schizophrenia and not on medication.  Will request psychiatry input to help manage his medication. Cont supportive care, reorientation, fall precautions.  Acute stroke:Neurology on board.lipdid panel LDL at 102,hba1c 6.3. MRA no LVO or stenosis. MRI limited- mod sized acute ischemic infarct rt frontoparietal region, post Rt MCA.  On aspirin 81, Lipitor.  Carotid duplex no acute finding, neurology following and has recommended-anticoagulation 7 to 10 days post stroke ( 9-27-9-30) given his severe cardiomyopathy with acute stroke and anticoagulation to be discontinued once LVEF >30-35%.  He has history of noncompliance substance abuse unclear how much compliance she will be. Once he is off anticoagulation neuro recommend 30-day cardiac event monitoring or loop recorder to rule out A. Fib: We will defer to neurology for anticoagulation decision  Polysubstance abuse:with cocaine, etoh.Patient sister confirmed that patient uses drugs and drinks alcohol.   History of schizophrenia not taking medication as per sister.  Will request psychiatry consult in the setting of current behavioral issues/to help with meds management  Mild AKI monitor closely on Lasix losartan Aldactone therapy Recent Labs  Lab 10/15/19 0633 10/16/19 0435 10/17/19 0435 10/18/19 1305 10/19/19 0242  BUN 18 17 20  22* 23*  CREATININE 1.28* 1.18 1.15 1.41* 1.28*    Diabetes mellitus type 2, noninsulin  dependent. Controlled hba1c 6.3, blood sugar is stable keep on sliding scale insulin.  Recent Labs  Lab 10/18/19 1154 10/18/19 1557 10/18/19 1750 10/18/19 2137 10/19/19 0630  GLUCAP 162* 136* 157*  115* 102*    Elevated troponin/chest pain, troponin mildly elevated: No further plan per cardiology likely demand ischemia.  He had stable cardiac cath in 2020  Mild transaminitis abdomen ultrasound unrevealing, LFTs have improved.  Slightly elevated ALT  DVT prophylaxis: enoxaparin (LOVENOX) injection 40 mg Start: 10/12/19 1600 Code Status:   Code Status: Full Code   Family Communication: plan of care discussed with patient and sister over the phone.  Discussed again at the bedside 9/23  Status is: Inpatient Remains inpatient appropriate because:Acute stroke, confusion, acute systolic CHF Dispo: The patient is from: Home-was living in a boarding home              Anticipated d/c is to: Cir evaluated-not a candidate for CIR as has no safe disposition post CIR and has recommended SNF first then ALF. Sister desires the same.              Anticipated d/c date is: > 3 days              Patient currently is not medically stable to d/c. Nutrition: Diet Order            Diet Carb Modified Fluid consistency: Thin; Room service appropriate? No  Diet effective now                 Body mass index is 22.28 kg/m. Consultants:see note  Procedures: TTE 9/17 1. Left ventricular ejection fraction, by estimation, is 15-20%. The left  ventricle has severely decreased function. The left ventricle demonstrates  global hypokinesis. The left ventricular internal cavity size was severely  dilated. Left ventricular  diastolic parameters are consistent with Grade III diastolic dysfunction  (restrictive). Elevated left atrial pressure.  2. Right ventricular systolic function is severely reduced. The right  ventricular size is severely enlarged. There is moderately elevated  pulmonary artery systolic pressure. The estimated right ventricular  systolic pressure is 54.8 mmHg.  3. Left atrial size was severely dilated.  4. Right atrial size was moderately dilated.  5. Severe mitral regurgitation.  Restricted PMVL in systole due to dilated  cardiomyopathy (IIIb). The mitral valve is abnormal. Moderate to severe  mitral valve regurgitation.  6. Tricuspid valve regurgitation is moderate to severe.  7. The aortic valve is tricuspid. Aortic valve regurgitation is not  visualized. No aortic stenosis is present.  8. The inferior vena cava is normal in size with <50% respiratory  variability, suggesting right atrial pressure of 8 mmHg  MRI HEAD IMPRESSION: 9/21  1. Limited exam due to the patient's inability to tolerate the full length of the study. Diffusion-weighted imaging only was performed. 2. Moderate-sized acute ischemic infarct involving the right frontoparietal region, posterior right MCA distribution. No associated mass effect. 3. No other definite acute intracranial abnormality.  MRA HEAD IMPRESSION: 9/21  Negative intracranial MRA. No large vessel occlusion or hemodynamically significant stenosis. No aneurysm  Microbiology:see note Blood Culture    Component Value Date/Time   SDES  06/10/2018 1803    BLOOD RIGHT FOREARM Performed at [facility], [address], [place], [place] [address]    SPECREQUEST  06/10/2018 1803    BOTTLES DRAWN AEROBIC ONLY Blood Culture results may not be optimal due to an inadequate volume of blood received in culture bottles Performed at  [facility], [address] [place], [place] [address]    CULT  06/10/2018 1803    NO GROWTH 5 DAYS Performed at [facility], [address] [place], [place] [address]    REPTSTATUS 06/16/2018 FINAL 06/10/2018 1803    Other culture-see note  Medications: Scheduled Meds: . aspirin  81 mg Oral Daily  . atorvastatin  40 mg Oral QHS  . digoxin  0.125 mg Oral Daily  . enoxaparin (LOVENOX) injection  40 mg Subcutaneous Q24H  . folic acid  1 mg Oral Daily  . furosemide  40 mg Oral Daily  . insulin aspart  0-5 Units Subcutaneous QHS  . insulin aspart  0-9  Units Subcutaneous TID WC  . losartan  25 mg Oral Daily  . multivitamin with minerals  1 tablet Oral Daily  . sodium chloride flush  3 mL Intravenous Q12H  . spironolactone  25 mg Oral Daily  . [START ON 10/24/2019] thiamine injection  100 mg Intravenous Daily   Continuous Infusions: . sodium chloride 10 mL/hr at 10/17/19 2317  . thiamine injection 250 mg (10/18/19 1345)    Antimicrobials: Anti-infectives (From admission, onward)   None     Objective: Vitals: Today's Vitals   10/18/19 2000 10/18/19 2319 10/19/19 0341 10/19/19 0845  BP:  111/75 114/79 118/81  Pulse:  63 62 63  Resp:  Temp:  98 F (36.7 C) 98 F (36.7 C) 97.9 F (36.6 C)  TempSrc:  Oral Oral Oral  SpO2:  100% 100% 99%  Weight:      Height:      PainSc: 0-No pain       Intake/Output Summary (Last 24 hours) at 10/19/2019 0925 Last data filed at 10/19/2019 0500 Gross per 24 hour  Intake 754.9 ml  Output 1820 ml  Net -1065.1 ml   Filed Weights   10/15/19 0430 10/16/19 0437 10/17/19 0500  Weight: 76.2 kg 74.3 kg 74.5 kg   Weight change:   Intake/Output from previous day: 09/23 0701 - 09/24 0700 In: 754.9 [P.O.:620; I.V.:19.9; IV Piggyback:115] Out: 1820 [Urine:1820] Intake/Output this shift: No intake/output data recorded.  Examination:  General exam: Alert awake not following commands or instruction. NAD, weak appearing. HEENT:Oral mucosa moist, Ear/Nose WNL grossly, dentition normal. Respiratory system: bilaterally clear,no wheezing or crackles,no use of accessory muscle Cardiovascular system: S1 & S2 +, No JVD,. Gastrointestinal system: Abdomen soft, NT,ND, BS+ Nervous System:Alert, awake, moving extremities on his own but limited exam as he does not follow instruction.   Extremities: No edema, distal peripheral pulses palpable.  Skin: No rashes,no icterus. MSK: Normal muscle bulk,tone, power  Data Reviewed: I have personally reviewed following labs and imaging  studies CBC: Recent Labs  Lab 10/13/19 0600 10/14/19 1248 10/17/19 0435 10/18/19 1305 10/19/19 0242  WBC 4.2 4.1 4.3 3.2* 3.5*  HGB 12.1* 13.5 15.9 15.0 15.5  HCT 38.3* 43.6 49.5 47.0 48.6  MCV 85.5 87.4 87.0 85.3 84.8  PLT 208 244 224 255 253   Basic Metabolic Panel: Recent Labs  Lab 10/13/19 0600 10/13/19 0600 10/14/19 1248 10/14/19 1248 10/15/19 0633 10/16/19 0435 10/17/19 0435 10/18/19 1305 10/19/19 0242  NA 139   < > 136   < > 137 136 135 134* 134*  K 3.6   < > 4.4   < > 4.5 4.3 4.4 4.6 4.4  CL 106   < > 106   < > 107 103 102 102 100  CO2 24   < >  23   < > GLUCOSE 84   < > 102*   < > 98 87 104* 171* 101*  BUN 18   < > 16   < > 22* 23*  CREATININE 1.13   < > 1.13   < > 1.28* 1.18 1.15 1.41* 1.28*  CALCIUM 8.4*   < > 8.7*   < > 9.1 9.3 9.5 9.4 9.4  MG 1.5*  --  2.2  --   --   --   --   --   --   PHOS 3.6  --   --   --   --   --   --   --   --    < > = values in this interval not displayed.   GFR: Estimated Creatinine Clearance: 70.3 mL/min (A) (by C-G formula based on SCr of 1.28 mg/dL (H)). Liver Function Tests: Recent Labs  Lab 10/12/19 1130 10/17/19 0435 10/18/19 1305 10/19/19 0242  AST 37 31 34 32  ALT 108* 51* 47* 47*  ALKPHOS 147* 111 111 116  BILITOT 0.6 0.5 0.8 0.8  PROT 6.3* 7.3 7.0 7.4  ALBUMIN 3.3* 3.5 3.3* 3.6   Recent Labs  Lab 10/12/19 1130  LIPASE 69*   No results for input(s): AMMONIA in the last 168 hours. Coagulation Profile: Recent Labs  Lab 10/12/19 1130  INR 1.2   Cardiac Enzymes: No results for input(s): CKTOTAL, CKMB, CKMBINDEX, TROPONINI in the last 168 hours. BNP (last 3 results) No results for input(s): PROBNP in the last 8760 hours. HbA1C: Recent Labs    10/17/19 0435  HGBA1C 6.3*   CBG: Recent Labs  Lab 10/18/19 1154 10/18/19 1557 10/18/19 1750 10/18/19 2137 10/19/19 0630  GLUCAP 162* 136* 157* 115* 102*   Lipid Profile: Recent Labs    10/17/19 0435  CHOL 170  HDL 50   LDLCALC 102*  TRIG 88  CHOLHDL 3.4   Thyroid Function Tests: No results for input(s): TSH, T4TOTAL, FREET4, T3FREE, THYROIDAB in the last 72 hours. Anemia Panel: No results for input(s): VITAMINB12, FOLATE, FERRITIN, TIBC, IRON, RETICCTPCT in the last 72 hours. Sepsis Labs: No results for input(s): PROCALCITON, LATICACIDVEN in the last 168 hours.  Recent Results (from the past 240 hour(s))  SARS Coronavirus 2 by RT PCR (hospital order, performed in [facility] hospital lab) Nasopharyngeal Nasopharyngeal Swab     Status: None   Collection Time: 10/11/19  4:45 PM   Specimen: Nasopharyngeal Swab  Result Value Ref Range Status   SARS Coronavirus 2 NEGATIVE NEGATIVE Final    Comment: (NOTE) SARS-CoV-2 target nucleic acids are NOT DETECTED.  The SARS-CoV-2 RNA is generally detectable in upper and lower respiratory specimens during the acute phase of infection. The lowest concentration of SARS-CoV-2 viral copies this assay can detect is 250 copies / mL. A negative result does not preclude SARS-CoV-2 infection and should not be used as the sole basis for treatment or other patient management decisions.  A negative result may occur with improper specimen collection / handling, submission of specimen other than nasopharyngeal swab, presence of viral mutation(s) within the areas targeted by this assay, and inadequate number of viral copies (<250 copies / mL). A negative result must be combined with clinical observations, patient history, and epidemiological information.  Fact Sheet for Patients:   BoilerBrush.com.cy  Fact Sheet for Healthcare Providers: https://pope.com/  This test is not yet approved or  cleared by the [place]  FDA and has been authorized for detection and/or diagnosis of SARS-CoV-2 by FDA under an Emergency Use Authorization (EUA).  This EUA will remain in effect (meaning this test can be used) for the duration of  the COVID-19 declaration under Section 564(b)(1) of the Act, 21 U.S.C. section 360bbb-3(b)(1), unless the authorization is terminated or revoked sooner.  Performed at [facility], [address], [place], [place] [address]      Radiology Studies: VAS US CAROTID (at [facility] and [facility] only)  Result Date: 10/17/2019 Carotid Arterial Duplex Study Indications:       CVA. Risk Factors:      Current smoker. Comparison Study:  No prior studies. Performing Technologist: Jean Rosenthal  Examination Guidelines: A complete evaluation includes B-mode imaging, spectral Doppler, color Doppler, and power Doppler as needed of all accessible portions of each vessel. Bilateral testing is considered an integral part of a complete examination. Limited examinations for reoccurring indications may be performed as noted.  Right Carotid Findings: +----------+--------+--------+--------+------------------+------------------+           PSV cm/sEDV cm/sStenosisPlaque DescriptionComments           +----------+--------+--------+--------+------------------+------------------+ CCA Prox  146     26                                                   +----------+--------+--------+--------+------------------+------------------+ CCA Distal148     25                                intimal thickening +----------+--------+--------+--------+------------------+------------------+ ICA Prox  152     18      1-39%   heterogenous                         +----------+--------+--------+--------+------------------+------------------+ ICA Distal85      23                                                   +----------+--------+--------+--------+------------------+------------------+ ECA       139     19                                                   +----------+--------+--------+--------+------------------+------------------+ +----------+--------+-------+----------------+-------------------+           PSV cm/sEDV  cmsDescribe        Arm Pressure (mmHG) +----------+--------+-------+----------------+-------------------+ [id number]            Multiphasic, WNL                    +----------+--------+-------+----------------+-------------------+ +---------+--------+--+--------+--+---------+ VertebralPSV cm/s53EDV cm/s12Antegrade +---------+--------+--+--------+--+---------+  Left Carotid Findings: +----------+--------+--------+--------+------------------+------------------+           PSV cm/sEDV cm/sStenosisPlaque DescriptionComments           +----------+--------+--------+--------+------------------+------------------+ CCA Prox  169     28  intimal thickening +----------+--------+--------+--------+------------------+------------------+ CCA Distal122     21                                intimal thickening +----------+--------+--------+--------+------------------+------------------+ ICA Prox  48      16      1-39%   heterogenous                         +----------+--------+--------+--------+------------------+------------------+ ICA Distal112     29                                                   +----------+--------+--------+--------+------------------+------------------+ ECA       143     18                                                   +----------+--------+--------+--------+------------------+------------------+ +----------+--------+--------+----------------+-------------------+           PSV cm/sEDV cm/sDescribe        Arm Pressure (mmHG) +----------+--------+--------+----------------+-------------------+ [id number]             Multiphasic, WNL                    +----------+--------+--------+----------------+-------------------+ +---------+--------+--+--------+--+---------+ VertebralPSV cm/s63EDV cm/s11Antegrade +---------+--------+--+--------+--+---------+   Summary: Right Carotid: Velocities in the right  ICA are consistent with a 1-39% stenosis. Left Carotid: Velocities in the left ICA are consistent with a 1-39% stenosis. Vertebrals:  Bilateral vertebral arteries demonstrate antegrade flow. Subclavians: Normal flow hemodynamics were seen in bilateral subclavian              arteries. *See table(s) above for measurements and observations.  Electronically signed by Coral Else MD on 10/17/2019 at 9:32:25 PM.    Final      LOS: 7 days   Lanae Boast, MD [facility]  10/19/2019, 9:25 AM

## 2019-10-19 NOTE — Plan of Care (Signed)
  Problem: Education: Goal: Knowledge of General Education information will improve Description: Including pain rating scale, medication(s)/side effects and non-pharmacologic comfort measures Outcome: Progressing   Problem: Health Behavior/Discharge Planning: Goal: Ability to manage health-related needs will improve Outcome: Progressing   Problem: Clinical Measurements: Goal: Ability to maintain clinical measurements within normal limits will improve Outcome: Progressing Goal: Will remain free from infection Outcome: Progressing Goal: Diagnostic test results will improve Outcome: Progressing Goal: Respiratory complications will improve Outcome: Progressing Goal: Cardiovascular complication will be avoided Outcome: Progressing   Problem: Activity: Goal: Risk for activity intolerance will decrease Outcome: Progressing   Problem: Nutrition: Goal: Adequate nutrition will be maintained Outcome: Progressing   Problem: Coping: Goal: Level of anxiety will decrease Outcome: Progressing   Problem: Elimination: Goal: Will not experience complications related to bowel motility Outcome: Progressing Goal: Will not experience complications related to urinary retention Outcome: Progressing   Problem: Pain Managment: Goal: General experience of comfort will improve Outcome: Progressing   Problem: Safety: Goal: Ability to remain free from injury will improve Outcome: Progressing   Problem: Skin Integrity: Goal: Risk for impaired skin integrity will decrease Outcome: Progressing   Problem: Education: Goal: Ability to demonstrate management of disease process will improve Outcome: Progressing Goal: Ability to verbalize understanding of medication therapies will improve Outcome: Progressing Goal: Individualized Educational Video(s) Outcome: Progressing   Problem: Activity: Goal: Capacity to carry out activities will improve Outcome: Progressing   Problem: Cardiac: Goal:  Ability to achieve and maintain adequate cardiopulmonary perfusion will improve Outcome: Progressing   Problem: Education: Goal: Knowledge of secondary prevention will improve Outcome: Progressing Goal: Knowledge of patient specific risk factors addressed and post discharge goals established will improve Outcome: Progressing Goal: Individualized Educational Video(s) Outcome: Progressing   Problem: Self-Care: Goal: Verbalization of feelings and concerns over difficulty with self-care will improve Outcome: Progressing Goal: Ability to communicate needs accurately will improve Outcome: Progressing

## 2019-10-19 NOTE — Progress Notes (Signed)
  Speech Language Pathology Treatment: Cognitive-Linquistic  Patient Details Name: Kerry Hamilton MRN: [id number] DOB: 06/20/1965 Today's Date: 10/19/2019 Time: 8841-6606 SLP Time Calculation (min) (ACUTE ONLY): 17 min  Assessment / Plan / Recommendation Clinical Impression  Pt was seen for treatment and was alert throughout the session. He did not respond to the majority of the SLP's questions or participate in any structured tasks despite education and encouragement. Pt verbalized his wants and needs including his desire to have a ginger ale and his need to go to the bathroom. He communicated at the sentence level with dysfluencies but no significant word retrieval difficulty was noted. Pt was advised to wait for assistance to go to the bathroom; however, he proceeded to ambulate to the bathroom without assistance. Pt's nurse was nearby and ultimately supervised pt. The session was terminated to allow him to use the commode. SLP will continue to follow pt, but frequency will be reduced to once per week until pt demonstrates willingness to participate in treatment.   HPI HPI: Pt is a 53 y.o. male admitted 10/11/19 with SOB and chest pain; chart states pt with ETOH and cocaine use for the past week. Workup for acute on chronic CHF secondary to nonischemic cardiomyopathy, suspect ETOH abuse. Worsening confusion noted 9/20-9/21; MRI with acute, moderate-sized infarct in R frontoparietal region. PMH includes substance abuse, DM2, CAD, depression.      SLP Plan  Continue with current plan of care       Recommendations  Diet recommendations: Regular;Thin liquid Liquids provided via: Straw;Cup Medication Administration: Whole meds with liquid Supervision: Patient able to self feed Compensations: Slow rate;Small sips/bites                Oral Care Recommendations: Oral care BID Follow up Recommendations: Skilled Nursing facility SLP Visit Diagnosis: Aphasia (R47.01) Plan: Continue with current  plan of care       Marvie Calender I. Vear Clock, MS, CCC-SLP Acute Rehabilitation Services Office number [phone] Pager [phone]                 Scheryl Marten 10/19/2019, 5:28 PM

## 2019-10-20 ENCOUNTER — Encounter (HOSPITAL_COMMUNITY): Payer: Self-pay | Admitting: Internal Medicine

## 2019-10-20 DIAGNOSIS — I5023 Acute on chronic systolic (congestive) heart failure: Secondary | ICD-10-CM | POA: Diagnosis not present

## 2019-10-20 LAB — BASIC METABOLIC PANEL
Anion gap: 9 (ref 5–15)
BUN: 28 mg/dL — ABNORMAL HIGH (ref 6–20)
CO2: 23 mmol/L (ref 22–32)
Calcium: 9.6 mg/dL (ref 8.9–10.3)
Chloride: 101 mmol/L (ref 98–111)
Creatinine, Ser: 1.34 mg/dL — ABNORMAL HIGH (ref 0.61–1.24)
GFR calc Af Amer: >60 mL/min (ref >60)
GFR calc non Af Amer: >60 mL/min (ref >60)
Glucose, Bld: 114 mg/dL — ABNORMAL HIGH (ref 70–99)
Potassium: 4.6 mmol/L (ref 3.5–5.1)
Sodium: 133 mmol/L — ABNORMAL LOW (ref 135–145)

## 2019-10-20 LAB — GLUCOSE, CAPILLARY
Glucose-Capillary: 112 mg/dL — ABNORMAL HIGH (ref 70–99)
Glucose-Capillary: 151 mg/dL — ABNORMAL HIGH (ref 70–99)
Glucose-Capillary: 223 mg/dL — ABNORMAL HIGH (ref 70–99)
Glucose-Capillary: 111 mg/dL — ABNORMAL HIGH (ref 70–99)

## 2019-10-20 MED ORDER — LORAZEPAM 2 MG/ML IJ SOLN
0.5000 mg | Freq: Three times a day (TID) | INTRAMUSCULAR | Status: DC | PRN
  Filled 2019-10-20: qty 1

## 2019-10-20 NOTE — Progress Notes (Signed)
PROGRESS NOTE    Kerry Hamilton  WUJ:[id number] DOB: 22-Feb-1965 DOA: 10/11/2019 PCP: Medicine, [facility]   Chief Complaint  Patient presents with  . Chest Pain    Brief  Per chart: 54 year old man with EF 15%, DM 2 was admitted yesterday with shortness of breath and chest pain.  She had been drinking a lot and using crack for the last week.  Laboratory data was noted for decompensated heart failure and troponin around 25.Patient was treated with Lasix IV and seen by the advanced heart failure team.  On day 3 after admission patient was noted to be talking to himself and somewhat confused.  Patient had been on CIWA protocol and is thought to be in DTs. Ativan has been effective in controlling his agitation.  9/21:Continues to be confused, not following instruction moving upper extremities, CT head obtained that showed acute stroke and neurology was consulted and transferred to Neuro floor 3W. Neurology following, given his alcohol abuse letter cardiology question wet Mahala Menghini s/p high-dose IV thiamine.  Patient remains alert awake at times agitated needing sitter.  Completed stroke work-up.Lab works cardiac duplex MRI brain MRA.  Subjective:  Alert,awake, stuttering, nursing reports he was saying "crack" but difficult to comprehend Overnight afebrile. Blood pressure stable.  Per nursing he is able to walk with assist to the bathroom but does not know what to do when he goes the bathroom and needs assistance to help him void  Assessment & Plan:  Acute on chronic systolic CHF 2/2 Nonischemic cardiomyopathy, ?Wet Beri beri from [place] abuse/thiamine deficiency-echo 5/20 EF 15% right/left HC 5/20-no CAD. Seen by cardiology no further work-up and not a candidate for advanced therapies given his poor compliance and substance abuse. his lvef is 15-20% currently.Continue on Lasix 40 mg, Lanoxin, losartan and Aldactone.  Not on beta-blocker due to low EF.    Acute metabolic encephalopathy/  possible DTs, werneckie's? given his alcohol abuse- S/P high dose thiamine as per neuro. MRI w/ acute stroke. Sister reports a history of schizophrenia and not on medication. Seen by psychiatry and they are going to follow-up closely for full assessment. Continue supportive measures. One to one sitter if needed, keep fall precautions and ambulate only with assistance. 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 MRA no LVO or stenosis. MRI limited- mod sized acute ischemic infarct rt frontoparietal region, post Rt MCA. Carotid duplex no acute finding. Cont on Aspirin 81, Lipitor. Neurology following and has recommended-anticoagulation 7 to 10 days post stroke ( 9-27-9-30) given his severe cardiomyopathy with acute stroke and anticoagulation to be discontinued once LVEF >30-35%.  He has history of noncompliance substance abuse unclear how much compliance she will be. Once he is off anticoagulation neuro recommend 30-day cardiac event monitoring or loop recorder to rule out A. Fib: We will defer to neurology for anticoagulation decision.  Polysubstance abuse:with cocaine, etoh.Patient sister confirmed that patient uses drugs and drinks alcohol. Abortive measures  History of schizophrenia not taking medication as per sister. Psychiatry consult appreciated and they are unable to do full assessment and will keep the patient in the consult list. please follow along to adjust meds for agitation  Mild AKI monitor closely on Lasix losartan Aldactone therapy creat ranging at 1.1-1.4. Recent Labs  Lab 10/16/19 0435 10/17/19 0435 10/18/19 1305 10/19/19 0242 10/20/19 0350  BUN 17 20 22* 23*  28*  CREATININE 1.18 1.15 1.41* 1.28* 1.34*   Diabetes mellitus type 2, noninsulin dependent. Controlled hba1c 6.3, blood sugar well controlled on sliding scale  insulin.  Recent Labs  Lab 10/19/19 0630 10/19/19 1218 10/19/19 1626 10/19/19 2109 10/20/19 0611  GLUCAP 102* 237* 99 129* 112*    Elevated troponin/chest pain, troponin mildly elevated: Seen by cardiology no further invasive work-up planned likely demand ischemia. Stable cardiac cath in 2020  Mild transaminitis abdomen ultrasound unrevealing, LFTs have improved.  Slightly elevated ALT  DVT prophylaxis: enoxaparin (LOVENOX) injection 40 mg Start: 10/12/19 1600 Code Status:   Code Status: Full Code   Family Communication: plan of care discussed with patient and sister over the phone.  Discussed again at the bedside 9/23  Status is: Inpatient Remains inpatient appropriate because:Acute stroke, confusion, acute systolic CHF Dispo: The patient is from: Home-was living in a boarding home              Anticipated d/c is to: Cir evaluated-not a candidate for CIR as has no safe disposition post CIR and has recommended SNF first then ALF. Sister desires the same.              Anticipated d/c date is: > 3 days              Patient currently is not medically stable to d/c. Nutrition: Diet Order            Diet Carb Modified Fluid consistency: Thin; Room service appropriate? No  Diet effective now                 Body mass index is 22.13 kg/m. Consultants:see note  Procedures: TTE 9/17 1. Left ventricular ejection fraction, by estimation, is 15-20%. The left  ventricle has severely decreased function. The left ventricle demonstrates  global hypokinesis. The left ventricular internal cavity size was severely  dilated. Left ventricular  diastolic parameters are consistent with Grade III diastolic dysfunction  (restrictive). Elevated left atrial pressure.  2. Right ventricular systolic function is severely reduced. The right  ventricular size is severely enlarged. There is moderately elevated  pulmonary artery systolic pressure. The estimated right ventricular  systolic pressure is  54.8 mmHg.  3. Left atrial size was severely dilated.  4. Right atrial size was moderately dilated.  5. Severe mitral regurgitation. Restricted PMVL in systole due to dilated  cardiomyopathy (IIIb). The mitral valve is abnormal. Moderate to severe  mitral valve regurgitation.  6. Tricuspid valve regurgitation is moderate to severe.  7. The aortic valve is tricuspid. Aortic valve regurgitation is not  visualized. No aortic stenosis is present.  8. The inferior vena cava is normal in size with <50% respiratory  variability, suggesting right atrial pressure of 8 mmHg  MRI HEAD IMPRESSION: 9/21  1. Limited exam due to the patient's inability to tolerate the full length of the study. Diffusion-weighted imaging only was performed. 2. Moderate-sized acute ischemic infarct involving the right frontoparietal region, posterior right MCA distribution. No associated mass effect. 3. No other definite acute intracranial abnormality.  MRA HEAD IMPRESSION: 9/21  Negative intracranial MRA. No large vessel occlusion or hemodynamically significant stenosis. No aneurysm  Microbiology:see note Blood Culture    Component Value Date/Time   SDES  06/10/2018 1803    BLOOD RIGHT FOREARM Performed at [facility], [address], [place], [place] [address]    SPECREQUEST  06/10/2018 1803    BOTTLES DRAWN AEROBIC ONLY Blood Culture results may not be  optimal due to an inadequate volume of blood received in culture bottles Performed at [facility], [address] [place], [place] [address]    CULT  06/10/2018 1803    NO GROWTH 5 DAYS Performed at [facility], [address], [place], [place] [address]    REPTSTATUS 06/16/2018 FINAL 06/10/2018 1803    Other culture-see note  Medications: Scheduled Meds: . aspirin  81 mg Oral Daily  . atorvastatin  40 mg Oral QHS  . digoxin  0.125 mg Oral Daily  . enoxaparin (LOVENOX) injection  40 mg Subcutaneous Q24H    . folic acid  1 mg Oral Daily  . furosemide  40 mg Oral Daily  . insulin aspart  0-5 Units Subcutaneous QHS  . insulin aspart  0-9 Units Subcutaneous TID WC  . losartan  25 mg Oral Daily  . multivitamin with minerals  1 tablet Oral Daily  . sodium chloride flush  3 mL Intravenous Q12H  . spironolactone  25 mg Oral Daily  . [START ON 10/24/2019] thiamine injection  100 mg Intravenous Daily   Continuous Infusions: . sodium chloride 10 mL/hr at 10/17/19 2317  . thiamine injection Stopped (10/19/19 1011)    Antimicrobials: Anti-infectives (From admission, onward)   None     Objective: Vitals: Today's Vitals   10/19/19 1620 10/20/19 0003 10/20/19 0350 10/20/19 0500  BP: 108/74 113/82 113/73   Pulse: 71 (!) 55 (!) 56   Resp: 17 16 17    Temp: 98.4 F (36.9 C) 98.1 F (36.7 C) (!) 97.5 F (36.4 C)   TempSrc: Oral Oral Oral   SpO2: 100% 100% 100%   Weight:    74 kg  Height:      PainSc:        Intake/Output Summary (Last 24 hours) at 10/20/2019 0754 Last data filed at 10/20/2019 0105 Gross per 24 hour  Intake 570 ml  Output --  Net 570 ml   Filed Weights   10/16/19 0437 10/17/19 0500 10/20/19 0500  Weight: 74.3 kg 74.5 kg 74 kg   Weight change:   Intake/Output from previous day: 09/24 0701 - 09/25 0700 In: 570 [P.O.:520; IV Piggyback:50] Out: -  Intake/Output this shift: No intake/output data recorded.  Examination:  General exam: AA, speaking some words at times, not following commands, calm HEENT:Oral mucosa moist, Ear/Nose WNL grossly, dentition normal. Respiratory system: bilaterally clear,no wheezing or crackles,no use of accessory muscle Cardiovascular system: S1 & S2 +, No JVD,. Gastrointestinal system: Abdomen soft, NT,ND, BS+ Nervous System:Alert, awake, moving extremities on his own but does not follow commands Extremities: No edema, distal peripheral pulses palpable.  Skin: No rashes,no icterus. MSK: Normal muscle bulk,tone, power  Data Reviewed:  I have personally reviewed following labs and imaging studies CBC: Recent Labs  Lab 10/14/19 1248 10/17/19 0435 10/18/19 1305 10/19/19 0242  WBC 4.1 4.3 3.2* 3.5*  HGB 13.5 15.9 15.0 15.5  HCT 43.6 49.5 47.0 48.6  MCV 87.4 87.0 85.3 84.8  PLT 244 224 255 253   Basic Metabolic Panel: Recent Labs  Lab 10/14/19 1248 10/15/19 0633 10/16/19 0435 10/17/19 0435 10/18/19 1305 10/19/19 0242 10/20/19 0350  NA 136   < > 136 135 134* 134* 133*  K 4.4   < > 4.3 4.4 4.6 4.4 4.6  CL 106   < > 103 102 102 100 101  CO2 23   < > 22 23 22 23 23   GLUCOSE 102*   < > 87 104*  171* 101* 114*  BUN 16   < > 17 20 22* 23* 28*  CREATININE 1.13   < > 1.18 1.15 1.41* 1.28* 1.34*  CALCIUM 8.7*   < > 9.3 9.5 9.4 9.4 9.6  MG 2.2  --   --   --   --   --   --    < > = values in this interval not displayed.   GFR: Estimated Creatinine Clearance: 66.7 mL/min (A) (by C-G formula based on SCr of 1.34 mg/dL (H)). Liver Function Tests: Recent Labs  Lab 10/17/19 0435 10/18/19 1305 10/19/19 0242  AST 31 34 32  ALT 51* 47* 47*  ALKPHOS 111 111 116  BILITOT 0.5 0.8 0.8  PROT 7.3 7.0 7.4  ALBUMIN 3.5 3.3* 3.6   No results for input(s): LIPASE, AMYLASE in the last 168 hours. No results for input(s): AMMONIA in the last 168 hours. Coagulation Profile: No results for input(s): INR, PROTIME in the last 168 hours. Cardiac Enzymes: No results for input(s): CKTOTAL, CKMB, CKMBINDEX, TROPONINI in the last 168 hours. BNP (last 3 results) No results for input(s): PROBNP in the last 8760 hours. HbA1C: No results for input(s): HGBA1C in the last 72 hours. CBG: Recent Labs  Lab 10/19/19 0630 10/19/19 1218 10/19/19 1626 10/19/19 2109 10/20/19 0611  GLUCAP 102* 237* 99 129* 112*   Lipid Profile: No results for input(s): CHOL, HDL, LDLCALC, TRIG, CHOLHDL, LDLDIRECT in the last 72 hours. Thyroid Function Tests: No results for input(s): TSH, T4TOTAL, FREET4, T3FREE, THYROIDAB in the last 72 hours. Anemia  Panel: No results for input(s): VITAMINB12, FOLATE, FERRITIN, TIBC, IRON, RETICCTPCT in the last 72 hours. Sepsis Labs: No results for input(s): PROCALCITON, LATICACIDVEN in the last 168 hours.  Recent Results (from the past 240 hour(s))  SARS Coronavirus 2 by RT PCR (hospital order, performed in [facility] hospital lab) Nasopharyngeal Nasopharyngeal Swab     Status: None   Collection Time: 10/11/19  4:45 PM   Specimen: Nasopharyngeal Swab  Result Value Ref Range Status   SARS Coronavirus 2 NEGATIVE NEGATIVE Final    Comment: (NOTE) SARS-CoV-2 target nucleic acids are NOT DETECTED.  The SARS-CoV-2 RNA is generally detectable in upper and lower respiratory specimens during the acute phase of infection. The lowest concentration of SARS-CoV-2 viral copies this assay can detect is 250 copies / mL. A negative result does not preclude SARS-CoV-2 infection and should not be used as the sole basis for treatment or other patient management decisions.  A negative result may occur with improper specimen collection / handling, submission of specimen other than nasopharyngeal swab, presence of viral mutation(s) within the areas targeted by this assay, and inadequate number of viral copies (<250 copies / mL). A negative result must be combined with clinical observations, patient history, and epidemiological information.  Fact Sheet for Patients:   BoilerBrush.com.cy  Fact Sheet for Healthcare Providers: https://pope.com/  This test is not yet approved or  cleared by the [place] FDA and has been authorized for detection and/or diagnosis of SARS-CoV-2 by FDA under an Emergency Use Authorization (EUA).  This EUA will remain in effect (meaning this test can be used) for the duration of the COVID-19 declaration under Section 564(b)(1) of the Act, 21 U.S.C. section 360bbb-3(b)(1), unless the authorization is terminated or revoked  sooner.  Performed at [facility], [address] [place], [place] [address]      Radiology Studies: No results found.   LOS: 8 days  Lanae Boast, MD [facility]  10/20/2019, 7:54 AM

## 2019-10-21 DIAGNOSIS — I5023 Acute on chronic systolic (congestive) heart failure: Secondary | ICD-10-CM | POA: Diagnosis not present

## 2019-10-21 LAB — BASIC METABOLIC PANEL
Anion gap: 8 (ref 5–15)
BUN: 27 mg/dL — ABNORMAL HIGH (ref 6–20)
CO2: 24 mmol/L (ref 22–32)
Calcium: 9.7 mg/dL (ref 8.9–10.3)
Chloride: 100 mmol/L (ref 98–111)
Creatinine, Ser: 1.25 mg/dL — ABNORMAL HIGH (ref 0.61–1.24)
GFR calc Af Amer: >60 mL/min (ref >60)
GFR calc non Af Amer: >60 mL/min (ref >60)
Glucose, Bld: 88 mg/dL (ref 70–99)
Potassium: 4.6 mmol/L (ref 3.5–5.1)
Sodium: 132 mmol/L — ABNORMAL LOW (ref 135–145)

## 2019-10-21 LAB — GLUCOSE, CAPILLARY
Glucose-Capillary: 102 mg/dL — ABNORMAL HIGH (ref 70–99)
Glucose-Capillary: 152 mg/dL — ABNORMAL HIGH (ref 70–99)
Glucose-Capillary: 102 mg/dL — ABNORMAL HIGH (ref 70–99)
Glucose-Capillary: 106 mg/dL — ABNORMAL HIGH (ref 70–99)

## 2019-10-21 MED ORDER — LORAZEPAM 0.5 MG PO TABS
0.5000 mg | ORAL_TABLET | Freq: Three times a day (TID) | ORAL | Status: DC | PRN
  Administered 2019-10-21 – 2019-10-26 (×4): 0.5 mg via ORAL
  Filled 2019-10-21 (×4): qty 1

## 2019-10-21 NOTE — Consult Note (Signed)
Kerry Hamilton Face-to-Face Psychiatry Consult   Reason for Consult:   Agitation Referring Physician:  Internal Med Patient Identification: Kerry Hamilton MRN:  [id number] Principal Diagnosis: Acute on chronic systolic CHF (congestive heart failure) (HCC) Diagnosis:  Principal Problem:   Acute on chronic systolic CHF (congestive heart failure) (HCC) Active Problems:   Diabetes mellitus type 2, noninsulin dependent (HCC)   Elevated troponin   Polysubstance abuse (HCC)   Cerebral embolism with cerebral infarction   Total Time spent with patient: 15 minutes  Subjective:   Kerry Hamilton is a 54 y.o. male was evaluated face-to-face. Kerry Hamilton is awake, alert and smiling intermittently. patient observed sitting in bedside chair.  Patient has a slight stutter and slow with responses. Kerry Hamilton has delayed speech and perseverating is difficult to understand. Kerry Hamilton appears to be thought blocking with repetitive statements. Kerry Hamilton is very minimal and delayed responses to questions. Patient did not appear aggressive or agitated.  Chart reviewed charted history with substance abuse, and schizophrenia with a new stroke onset. Case was staffed with attending psychiatrist Lucianne Muss.  Consider reconsulting once medically cleared.  Support, reassurance and encouragement was provided.  HPI: Per consult note on 9/24/2021Patient is a 54 year old male admitted September 16 with shortness of breath and chest pain, patient with history of alcohol and cocaine use for the past week.  Psych consult was placed due to impulsive behaviors, history of schizophrenia, new confusion and stroke.  On evaluation patient open eyes, then drifted back to sleep and would not respond.  Patient would not agree to participate and psychiatric evaluation.  Writer also multiple verbal prompts in order to get patient to engage during the evaluation, however he continued to rest with his eyes closed.  Chart reviewed from attending provider patient was more alert and awake this  morning, however has been impulsive needing a Posey at nighttime.   Past Psychiatric History:  Risk to Self:   Risk to Others:   Prior Inpatient Therapy:   Prior Outpatient Therapy:    Past Medical History:  Past Medical History:  Diagnosis Date  . Cocaine abuse (HCC)   . Depression   . Diabetes mellitus without complication (HCC)   . Systolic congestive heart failure (HCC)   . Tobacco abuse     Past Surgical History:  Procedure Laterality Date  . RIGHT/LEFT HEART CATH AND CORONARY ANGIOGRAPHY N/A 06/13/2018   Procedure: RIGHT/LEFT HEART CATH AND CORONARY ANGIOGRAPHY;  Surgeon: [place]az[place], Peter M, MD;  Location: [facility] INVASIVE CV LAB;  Service: Cardiovascular;  Laterality: N/A;   Family History:  Family History  Problem Relation Age of Onset  . CAD Paternal Grandmother   . Diabetes Mellitus II Neg Hx    Family Psychiatric  History:  Social History:  Social History   Substance and Sexual Activity  Alcohol Use Yes  . Alcohol/week: 6.0 standard drinks  . Types: 6 Cans of beer per week   Comment: daily     Social History   Substance and Sexual Activity  Drug Use Yes  . Types: "Crack" cocaine    Social History   Socioeconomic History  . Marital status: Single    Spouse name: Not on file  . Number of children: Not on file  . Years of education: Not on file  . Highest education level: Not on file  Occupational History  . Not on file  Tobacco Use  . Smoking status: Current Every Day Smoker    Packs/day: 1.00    Types: Cigarettes, Cigars  . Smokeless tobacco:  Never Used  Vaping Use  . Vaping Use: Never used  Substance and Sexual Activity  . Alcohol use: Yes    Alcohol/week: 6.0 standard drinks    Types: 6 Cans of beer per week    Comment: daily  . Drug use: Yes    Types: "Crack" cocaine  . Sexual activity: Not Currently  Other Topics Concern  . Not on file  Social History Narrative  . Not on file   Social Determinants of Health   Financial Resource Strain:    . Difficulty of Paying Living Expenses: Not on file  Food Insecurity:   . Worried About Programme researcher, broadcasting/film/video in the Last Year: Not on file  . Ran Out of Food in the Last Year: Not on file  Transportation Needs:   . Lack of Transportation (Medical): Not on file  . Lack of Transportation (Non-Medical): Not on file  Physical Activity:   . Days of Exercise per Week: Not on file  . Minutes of Exercise per Session: Not on file  Stress:   . Feeling of Stress : Not on file  Social Connections:   . Frequency of Communication with Friends and Family: Not on file  . Frequency of Social Gatherings with Friends and Family: Not on file  . Attends Religious Services: Not on file  . Active Member of Clubs or Organizations: Not on file  . Attends Banker Meetings: Not on file  . Marital Status: Not on file   Additional Social History:    Allergies:  No Known Allergies  Labs:  Results for orders placed or performed during the hospital encounter of 10/11/19 (from the past 48 hour(s))  Glucose, capillary     Status: Abnormal   Collection Time: 10/19/19  9:09 PM  Result Value Ref Range   Glucose-Capillary 129 (H) 70 - 99 mg/dL    Comment: Glucose reference range applies only to samples taken after fasting for at least 8 hours.  Basic metabolic panel     Status: Abnormal   Collection Time: 10/20/19  3:50 AM  Result Value Ref Range   Sodium 133 (L) 135 - 145 mmol/L   Potassium 4.6 3.5 - 5.1 mmol/L   Chloride 101 98 - 111 mmol/L   CO2 23 22 - 32 mmol/L   Glucose, Bld 114 (H) 70 - 99 mg/dL    Comment: Glucose reference range applies only to samples taken after fasting for at least 8 hours.   BUN 28 (H) 6 - 20 mg/dL   Creatinine, Ser 5.00 (H) 0.61 - 1.24 mg/dL   Calcium 9.6 8.9 - 93.8 mg/dL   GFR calc non Af Amer >60 >60 mL/min   GFR calc Af Amer >60 >60 mL/min   Anion gap 9 5 - 15    Comment: Performed at [facility], [address], [place], [place] [address]  Glucose,  capillary     Status: Abnormal   Collection Time: 10/20/19  6:11 AM  Result Value Ref Range   Glucose-Capillary 112 (H) 70 - 99 mg/dL    Comment: Glucose reference range applies only to samples taken after fasting for at least 8 hours.   Comment 1 Notify RN    Comment 2 Document in Chart   Glucose, capillary     Status: Abnormal   Collection Time: 10/20/19 11:45 AM  Result Value Ref Range   Glucose-Capillary 151 (H) 70 - 99 mg/dL    Comment: Glucose reference range applies only  to samples taken after fasting for at least 8 hours.  Glucose, capillary     Status: Abnormal   Collection Time: 10/20/19  4:25 PM  Result Value Ref Range   Glucose-Capillary 223 (H) 70 - 99 mg/dL    Comment: Glucose reference range applies only to samples taken after fasting for at least 8 hours.  Glucose, capillary     Status: Abnormal   Collection Time: 10/20/19  9:48 PM  Result Value Ref Range   Glucose-Capillary 111 (H) 70 - 99 mg/dL    Comment: Glucose reference range applies only to samples taken after fasting for at least 8 hours.  Basic metabolic panel     Status: Abnormal   Collection Time: 10/21/19  4:43 AM  Result Value Ref Range   Sodium 132 (L) 135 - 145 mmol/L   Potassium 4.6 3.5 - 5.1 mmol/L   Chloride 100 98 - 111 mmol/L   CO2 24 22 - 32 mmol/L   Glucose, Bld 88 70 - 99 mg/dL    Comment: Glucose reference range applies only to samples taken after fasting for at least 8 hours.   BUN 27 (H) 6 - 20 mg/dL   Creatinine, Ser 9.35 (H) 0.61 - 1.24 mg/dL   Calcium 9.7 8.9 - 70.1 mg/dL   GFR calc non Af Amer >60 >60 mL/min   GFR calc Af Amer >60 >60 mL/min   Anion gap 8 5 - 15    Comment: Performed at [facility], [address], [place], [place] [address]  Glucose, capillary     Status: Abnormal   Collection Time: 10/21/19  6:25 AM  Result Value Ref Range   Glucose-Capillary 102 (H) 70 - 99 mg/dL    Comment: Glucose reference range applies only to samples taken after fasting for at  least 8 hours.   Comment 1 Notify RN    Comment 2 Document in Chart   Glucose, capillary     Status: Abnormal   Collection Time: 10/21/19 12:24 PM  Result Value Ref Range   Glucose-Capillary 152 (H) 70 - 99 mg/dL    Comment: Glucose reference range applies only to samples taken after fasting for at least 8 hours.   Comment 1 Notify RN    Comment 2 Document in Chart     Current Facility-Administered Medications  Medication Dose Route Frequency Provider Last Rate Last Admin  . 0.9 %  sodium chloride infusion  250 mL Intravenous PRN Madelyn Flavors A, MD 10 mL/hr at 10/17/19 2317 Restarted at 10/17/19 2317  . acetaminophen (TYLENOL) tablet 650 mg  650 mg Oral Q4H PRN Madelyn Flavors A, MD   650 mg at 10/19/19 2235  . aspirin chewable tablet 81 mg  81 mg Oral Daily Madelyn Flavors A, MD   81 mg at 10/21/19 0942  . atorvastatin (LIPITOR) tablet 40 mg  40 mg Oral QHS Marvel Plan, MD   40 mg at 10/20/19 2221  . digoxin (LANOXIN) tablet 0.125 mg  0.125 mg Oral Daily Bensimhon, Bevelyn Buckles, MD   0.125 mg at 10/21/19 0942  . enoxaparin (LOVENOX) injection 40 mg  40 mg Subcutaneous Q24H Smith, Rondell A, MD   40 mg at 10/20/19 1500  . folic acid (FOLVITE) tablet 1 mg  1 mg Oral Daily Katrinka Blazing, Rondell A, MD   1 mg at 10/21/19 0942  . furosemide (LASIX) tablet 40 mg  40 mg Oral Daily Leandro Reasoner Tublu, MD   40 mg at 10/21/19 0942  .  insulin aspart (novoLOG) injection 0-5 Units  0-5 Units Subcutaneous QHS Clydie Braun, MD   2 Units at 10/13/19 2255  . insulin aspart (novoLOG) injection 0-9 Units  0-9 Units Subcutaneous TID WC Clydie Braun, MD   2 Units at 10/21/19 1247  . LORazepam (ATIVAN) injection 0.5 mg  0.5 mg Intravenous Q8H PRN Kc, Ramesh, MD      . losartan (COZAAR) tablet 25 mg  25 mg Oral Daily Bensimhon, Bevelyn Buckles, MD   25 mg at 10/21/19 0942  . multivitamin with minerals tablet 1 tablet  1 tablet Oral Daily Madelyn Flavors A, MD   1 tablet at 10/21/19 0942  . nicotine polacrilex  (NICORETTE) gum 2 mg  2 mg Oral PRN Smith, Rondell A, MD      . sodium chloride flush (NS) 0.9 % injection 3 mL  3 mL Intravenous Q12H Smith, Rondell A, MD   3 mL at 10/21/19 0943  . sodium chloride flush (NS) 0.9 % injection 3 mL  3 mL Intravenous PRN Madelyn Flavors A, MD   3 mL at 10/12/19 2223  . spironolactone (ALDACTONE) tablet 25 mg  25 mg Oral Daily Bensimhon, Bevelyn Buckles, MD   25 mg at 10/21/19 0942  . thiamine (B-1) 250 mg in sodium chloride 0.9 % 50 mL IVPB  250 mg Intravenous Daily Erick Blinks, MD   Stopped at 10/21/19 1022   Followed by  . [START ON 10/24/2019] thiamine (B-1) injection 100 mg  100 mg Intravenous Daily Erick Blinks, MD        Musculoskeletal: Strength & Muscle Tone: within normal limits Gait & Station: Observed sitting in chair Patient leans: N/A  Psychiatric Specialty Exam: Physical Exam Vitals reviewed.     Review of Systems  Psychiatric/Behavioral: Positive for agitation. The patient is nervous/anxious.   All other systems reviewed and are negative.   Blood pressure 119/78, pulse 70, temperature 98.4 F (36.9 C), temperature source Oral, resp. rate 16, height 6' (1.829 m), weight 71.2 kg, SpO2 100 %.Body mass index is 21.29 kg/m.  General Appearance: Casual  Eye Contact:  Minimal  Speech:  Slurred and Repetitive  Volume:  Normal  Mood:  Euthymic  Affect:  Congruent  Thought Process:  NA  Orientation:  Other:  person and place  Thought Content:  NA  Suicidal Thoughts:  N/A  Homicidal Thoughts:  N/A  Memory:  Immediate;   NA Recent;   NA Remote;   NA  Judgement:  NA  Insight:  NA  Psychomotor Activity:  Normal  Concentration:  Concentration: Fair  Recall:  NA  Fund of Knowledge:  NA  Language:  Poor  Akathisia:  No  Handed:  Right  AIMS (if indicated):     Assets:  Communication Skills  ADL's:    Cognition:  Impaired,  Mild  Sleep:        Treatment Plan Summary: Daily contact with patient to assess and evaluate symptoms  and progress in treatment and Medication management  Disposition: Chart reviewed patient appears to be improving daily.  Consider reconsulting psychiatry once medically cleared  Oneta Rack, NP 10/21/2019 4:48 PM

## 2019-10-21 NOTE — Progress Notes (Signed)
Occupational Therapy Treatment Patient Details Name: Kerry Hamilton MRN: [id number] DOB: [birth year] Today's Date: 10/21/2019    History of present illness Pt is a 54 y.o. male admitted 10/11/19 with SOB and chest pain; chart states pt with ETOH and cocaine use for the past week. Workup for acute on chronic CHF secondary to nonischemic cardiomyopathy, suspect ETOH abuse. Worsening confusion noted 9/20-9/21; MRI with acute, moderate-sized infarct in R frontoparietal region. PMH includes substance abuse, DM2, CAD, depression.   OT comments  Pt. Seen for skilled OT treatment session.  RN present during session and assisted with bed mobility and sit/stand secondary to poor initiation and carry over from pt. Despite max cues and encouragement.  Max for grooming and lb dressing tasks as well.  Will cont. With current POC with ADL progression next session as able.   Follow Up Recommendations  CIR    Equipment Recommendations  None recommended by OT    Recommendations for Other Services Rehab consult    Precautions / Restrictions Precautions Precautions: Fall       Mobility Bed Mobility Overal bed mobility: Needs Assistance Bed Mobility: Supine to Sit     Supine to sit: Min assist;HOB elevated     General bed mobility comments: Pt does not initiate movement despite multimodal cues. Once therapist progressed LEs and began elevating trunk, pt's muscles activated to assist.   Transfers Overall transfer level: Needs assistance Equipment used: 2 person hand held assist Transfers: Sit to/from [employer] Sit to Stand: Min assist;+2 physical assistance Stand pivot transfers: Min guard;+2 physical assistance       General transfer comment: rn present and assisted with bed mobility and transition into standing.  pt. reached for arm rests without cues prior to sitting down. rn cont. to state "when he wants to he will, when he really needs to use the b.room he gets up and walks  without any assistance"    Balance                                           ADL either performed or assessed with clinical judgement   ADL Overall ADL's : Needs assistance/impaired     Grooming: Wash/dry face;Maximal assistance;Bed level Grooming Details (indicate cue type and reason): placed washcloth in pts. hand "whats this" "why" answered questions and intiatiated hand over hand assistance to wash a portion of his face. encouraged to wash the other portion. pt. would not engage             Lower Body Dressing: Maximal assistance;Sitting/lateral leans Lower Body Dressing Details (indicate cue type and reason): don socks pt. states "socks" in a way as to asking me to don for him. handed to him to attempt and no initiation of carryover demonstrated Toilet Transfer: Minimal assistance Toilet Transfer Details (indicate cue type and reason): simulated eob with ambulation to recliner max encouragement to intiate steps and walk           General ADL Comments: rn present states pt. has abilities but "only moves and does something when he wants to".  max verbal, tactile, and hand over hand assistance for task initiation with no completion noted.     Vision       Perception     Praxis      Cognition Arousal/Alertness: Awake/alert Behavior During Therapy: Flat affect  Exercises     Shoulder Instructions       General Comments      Pertinent Vitals/ Pain          Home Living                                          Prior Functioning/Environment              Frequency  Min 2X/week        Progress Toward Goals  OT Goals(current goals can now be found in the care plan section)  Progress towards OT goals: Progressing toward goals     Plan      Co-evaluation                 AM-PAC OT "6 Clicks" Daily Activity     Outcome Measure   Help from  another person eating meals?: A Little Help from another person taking care of personal grooming?: A Little Help from another person toileting, which includes using toliet, bedpan, or urinal?: A Lot Help from another person bathing (including washing, rinsing, drying)?: A Lot Help from another person to put on and taking off regular upper body clothing?: A Little Help from another person to put on and taking off regular lower body clothing?: A Lot 6 Click Score: 15    End of Session    OT Visit Diagnosis: Unsteadiness on feet (R26.81);Muscle weakness (generalized) (M62.81)   Activity Tolerance Patient tolerated treatment well   Patient Left in chair;with call bell/phone within reach;with chair alarm set   Nurse Communication          Time: [phone] OT Time Calculation (min): 8 min  Charges: OT General Charges $OT Visit: 1 Visit OT Treatments $Self Care/Home Management : 8-22 mins  Boneta Lucks, COTA/L Acute Rehabilitation [phone]   Robet Leu 10/21/2019, 10:34 AM

## 2019-10-21 NOTE — Progress Notes (Signed)
PROGRESS NOTE    Kerry Hamilton  VZD:[id number] DOB: [birth year] DOA: 10/11/2019 PCP: Medicine, [facility]   Chief Complaint  Patient presents with  . Chest Pain    Brief  Per chart: 54 year old man with EF 15%, DM 2 was admitted yesterday with shortness of breath and chest pain.  She had been drinking a lot and using crack for the last week.  Laboratory data was noted for decompensated heart failure and troponin around 25.Patient was treated with Lasix IV and seen by the advanced heart failure team.  On day 3 after admission patient was noted to be talking to himself and somewhat confused.  Patient had been on CIWA protocol and is thought to be in DTs. Ativan has been effective in controlling his agitation.  9/21:Continues to be confused, not following instruction moving upper extremities, CT head obtained that showed acute stroke and neurology was consulted and transferred to Neuro floor 3W. Neurology following, given his alcohol abuse letter cardiology question wet [place]hini s/p high-dose IV thiamine.  Patient completed stroke work-up.   Subjective: Seen this morning.  He is alert awake does not follow command instruction but however he kept on saying " I cannot think" " stroke" " it is bad" Afebrile overnight. Blood pressure controlled. Does not follow instruction or commands.  Assessment & Plan:  Acute on chronic systolic CHF 2/2 Nonischemic cardiomyopathy, ?Wet Ber[place] ber[place] from [place] abuse/thiamine deficiency-echo 5/20 EF 15% right/left HC 5/20-no CAD. Seen by cardiology no further work-up and not a candidate for advanced therapies given his poor compliance and substance abuse. his lvef is 15-20% currently.cardiology signed off, continue Lasix 40 mg, Lanoxin, losartan and Aldactone.  Not on beta-blocker due to low EF.    Acute metabolic encephalopathy/ possible DTs, werneckie's? given his alcohol abuse- S/P high dose thiamine as per neuro. MRI w/ acute stroke. Sister reports a  history of schizophrenia and not on medication. Seen by psychiatry and they are going to follow-up closely for full assessment. Continue supportive measures, fall precaution, reorientation. Psych team  please follow along to adjust meds for agitation,as he is impulsive at times.  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 MRA no LVO or stenosis. MRI limited- mod sized acute ischemic infarct rt frontoparietal region, post Rt MCA. Carotid duplex no acute finding. Cont on Aspirin 81, Lipitor. Neurology following and has recommended-anticoagulation 7 to 10 days post stroke ( 9-27-9-30) given his severe cardiomyopathy with acute stroke and anticoagulation to be discontinued once LVEF >30-35%.  He has history of noncompliance substance abuse unclear how much compliance she will be. Once he is off anticoagulation neuro recommend 30-day cardiac event monitoring or loop recorder to rule out A. Fib: We will defer to neurology for anticoagulation decision.  Polysubstance abuse:with cocaine, etoh.Patient sister confirmed that patient uses drugs and drinks alcohol.  Social worker consult.  History of schizophrenia not taking medication as per sister. Psychiatry consult appreciated and they are unable to do full assessment and will keep the patient in the consult list.  Mild AKI monitor closely on Lasix losartan Aldactone therapy creat ranging at 1.1-1.4. Recent Labs  Lab 10/17/19 0435 10/18/19 1305 10/19/19 0242 10/20/19 0350 10/21/19 0443  BUN 20 22* 23* 28* 27*  CREATININE 1.15 1.41* 1.28* 1.34* 1.25*   T2DM, noninsulin dependent. HBA1C 6.3 controlled.  Cont ssi Recent Labs  Lab 10/20/19 [phone] 10/20/19 1145 10/20/19 1625 10/20/19 2148 10/21/19  0625  GLUCAP 112* 151* 223* 111* 102*    Elevated troponin/chest pain,troponin mildly elevated:Seen by cardiology no further invasive work-up planned  likely demand ischemia.Stable cardiac cath in 2020.  Mild transaminitis abdomen ultrasound unrevealing,LFTs have improved.  DVT prophylaxis: enoxaparin (LOVENOX) injection 40 mg Start: 10/12/19 1600 Code Status:   Code Status: Full Code   Family Communication: plan of care discussed with patient and sister over the phone.  Discussed again at the bedside 9/23  Status GN:[id number] Remains inpatient appropriate because:Acute stroke, confusion, acute systolic CHF Dispo:The patient is from: Home-was living in a boarding home            Anticipated d/c is to: Cir evaluated-not a candidate for CIR as has no safe disposition post CIR and has recommended SNF first then ALF.Sister desires the same.              Anticipated d/c date is: Once bed available              Patient currently is not medically stable to d/c. Nutrition: Diet Order            Diet Carb Modified Fluid consistency: Thin; Room service appropriate? No  Diet effective now                 Body mass index is 21.29 kg/m. Consultants:see note  Procedures: TTE 9/17 1. Left ventricular ejection fraction, by estimation, is 15-20%. The left  ventricle has severely decreased function. The left ventricle demonstrates  global hypokinesis. The left ventricular internal cavity size was severely  dilated. Left ventricular  diastolic parameters are consistent with Grade III diastolic dysfunction  (restrictive). Elevated left atrial pressure.  2. Right ventricular systolic function is severely reduced. The right  ventricular size is severely enlarged. There is moderately elevated  pulmonary artery systolic pressure. The estimated right ventricular  systolic pressure is 54.8 mmHg.  3. Left atrial size was severely dilated.  4. Right atrial size was moderately dilated.  5. Severe mitral regurgitation. Restricted PMVL in systole due to dilated  cardiomyopathy (IIIb). The mitral valve is abnormal. Moderate to severe  mitral valve  regurgitation.  6. Tricuspid valve regurgitation is moderate to severe.  7. The aortic valve is tricuspid. Aortic valve regurgitation is not  visualized. No aortic stenosis is present.  8. The inferior vena cava is normal in size with <50% respiratory  variability, suggesting right atrial pressure of 8 mmHg  MRI HEAD IMPRESSION: 9/21 1.Limited exam due to the patient's inability to tolerate the full length of the study. Diffusion-weighted imaging only was performed. 2. Moderate-sized acute ischemic infarct involving the right frontoparietal region, posterior right MCA distribution. No associated mass effect. 3. No other definite acute intracranial abnormality.  MRA HEAD IMPRESSION: 9/21 Negative intracranial MRA. No large vessel occlusion or hemodynamically significant stenosis. No aneurysm  Microbiology:see note Blood Culture    Component Value Date/Time   SDES  06/10/2018 1803    BLOOD RIGHT FOREARM Performed at [facility], [address], [place], [place] [address]    SPECREQUEST  06/10/2018 1803    BOTTLES DRAWN AEROBIC ONLY Blood Culture results may not be optimal due to an inadequate volume of blood received in culture bottles Performed at [facility], [address], [place], [place] [address]    CULT  06/10/2018 1803    NO GROWTH 5 DAYS Performed at [facility], [address], [place], [place] [address]    REPTSTATUS 06/16/2018  FINAL 06/10/2018 1803    Other culture-see note  Medications: Scheduled Meds: . aspirin  81 mg Oral Daily  . atorvastatin  40 mg Oral QHS  . digoxin  0.125 mg Oral Daily  . enoxaparin (LOVENOX) injection  40 mg Subcutaneous Q24H  . folic acid  1 mg Oral Daily  . furosemide  40 mg Oral Daily  . insulin aspart  0-5 Units Subcutaneous QHS  . insulin aspart  0-9 Units Subcutaneous TID WC  . losartan  25 mg Oral Daily  . multivitamin with minerals  1 tablet Oral Daily  . sodium chloride flush  3 mL  Intravenous Q12H  . spironolactone  25 mg Oral Daily  . [START ON 10/24/2019] thiamine injection  100 mg Intravenous Daily   Continuous Infusions: . sodium chloride 10 mL/hr at 10/17/19 2317  . thiamine injection Stopped (10/20/19 1308)   Antimicrobials: Anti-infectives (From admission, onward)   None     Objective: Vitals: Today's Vitals   10/21/19 0000 10/21/19 0316 10/21/19 0411 10/21/19 0650  BP:   107/68   Pulse:   (!) 58   Resp:   19   Temp:   97.9 F (36.6 C)   TempSrc:   Oral   SpO2:   100%   Weight:  73.4 kg  71.2 kg  Height:      PainSc: 0-No pain  0-No pain     Intake/Output Summary (Last 24 hours) at 10/21/2019 0732 Last data filed at 10/21/2019 0650 Gross per 24 hour  Intake 340 ml  Output 650 ml  Net -310 ml   Filed Weights   10/20/19 0500 10/21/19 0316 10/21/19 0650  Weight: 74 kg 73.4 kg 71.2 kg   Weight change: -0.6 kg  Intake/Output from previous day: 09/25 0701 - 09/26 0700 In: 340 [P.O.:240; IV Piggyback:100] Out: 650 [Urine:650] Intake/Output this shift: No intake/output data recorded.  Examination:  General exam: AA NAD, weak appearing. HEENT:Oral mucosa moist, Ear/Nose WNL grossly, dentition normal. Respiratory system: bilaterally clear,no wheezing or crackles,no use of accessory muscle Cardiovascular system: S1 & S2 +, No JVD,. Gastrointestinal system: Abdomen soft, NT,ND, BS+ Nervous System:Alert, awake, moving extremities on his own but does not follow instruction, kept on saying "stroke", "I cannot think"  Extremities: No edema, distal peripheral pulses palpable.  Skin: No rashes,no icterus. MSK: Normal muscle bulk,tone, power  Data Reviewed: I have personally reviewed following labs and imaging studies CBC: Recent Labs  Lab 10/14/19 1248 10/17/19 0435 10/18/19 1305 10/19/19 0242  WBC 4.1 4.3 3.2* 3.5*  HGB 13.5 15.9 15.0 15.5  HCT 43.6 49.5 47.0 48.6  MCV 87.4 87.0 85.3 84.8  PLT 244 224 255 253   Basic Metabolic  Panel: Recent Labs  Lab 10/14/19 1248 10/15/19 0633 10/17/19 0435 10/18/19 1305 10/19/19 0242 10/20/19 0350 10/21/19 0443  NA 136   < > 135 134* 134* 133* 132*  K 4.4   < > 4.4 4.6 4.4 4.6 4.6  CL 106   < > 102 102 100 101 100  CO2 23   < > 23 22 23 23 24   GLUCOSE 102*   < > 104* 171* 101* 114* 88  BUN 16   < > 20 22* 23* 28* 27*  CREATININE 1.13   < > 1.15 1.41* 1.28* 1.34* 1.25*  CALCIUM 8.7*   < > 9.5 9.4 9.4 9.6 9.7  MG 2.2  --   --   --   --   --   --    < > =  values in this interval not displayed.   GFR: Estimated Creatinine Clearance: 68.8 mL/min (A) (by C-G formula based on SCr of 1.25 mg/dL (H)). Liver Function Tests: Recent Labs  Lab 10/17/19 0435 10/18/19 1305 10/19/19 0242  AST 31 34 32  ALT 51* 47* 47*  ALKPHOS 111 111 116  BILITOT 0.5 0.8 0.8  PROT 7.3 7.0 7.4  ALBUMIN 3.5 3.3* 3.6   No results for input(s): LIPASE, AMYLASE in the last 168 hours. No results for input(s): AMMONIA in the last 168 hours. Coagulation Profile: No results for input(s): INR, PROTIME in the last 168 hours. Cardiac Enzymes: No results for input(s): CKTOTAL, CKMB, CKMBINDEX, TROPONINI in the last 168 hours. BNP (last 3 results) No results for input(s): PROBNP in the last 8760 hours. HbA1C: No results for input(s): HGBA1C in the last 72 hours. CBG: Recent Labs  Lab 10/20/19 0611 10/20/19 1145 10/20/19 1625 10/20/19 2148 10/21/19 0625  GLUCAP 112* 151* 223* 111* 102*   Lipid Profile: No results for input(s): CHOL, HDL, LDLCALC, TRIG, CHOLHDL, LDLDIRECT in the last 72 hours. Thyroid Function Tests: No results for input(s): TSH, T4TOTAL, FREET4, T3FREE, THYROIDAB in the last 72 hours. Anemia Panel: No results for input(s): VITAMINB12, FOLATE, FERRITIN, TIBC, IRON, RETICCTPCT in the last 72 hours. Sepsis Labs: No results for input(s): PROCALCITON, LATICACIDVEN in the last 168 hours.  Recent Results (from the past 240 hour(s))  SARS Coronavirus 2 by RT PCR (hospital  order, performed in [facility] hospital lab) Nasopharyngeal Nasopharyngeal Swab     Status: None   Collection Time: 10/11/19  4:45 PM   Specimen: Nasopharyngeal Swab  Result Value Ref Range Status   SARS Coronavirus 2 NEGATIVE NEGATIVE Final    Comment: (NOTE) SARS-CoV-2 target nucleic acids are NOT DETECTED.  The SARS-CoV-2 RNA is generally detectable in upper and lower respiratory specimens during the acute phase of infection. The lowest concentration of SARS-CoV-2 viral copies this assay can detect is 250 copies / mL. A negative result does not preclude SARS-CoV-2 infection and should not be used as the sole basis for treatment or other patient management decisions.  A negative result may occur with improper specimen collection / handling, submission of specimen other than nasopharyngeal swab, presence of viral mutation(s) within the areas targeted by this assay, and inadequate number of viral copies (<250 copies / mL). A negative result must be combined with clinical observations, patient history, and epidemiological information.  Fact Sheet for Patients:   BoilerBrush.com.cy  Fact Sheet for Healthcare Providers: https://pope.com/  This test is not yet approved or  cleared by the [place] FDA and has been authorized for detection and/or diagnosis of SARS-CoV-2 by FDA under an Emergency Use Authorization (EUA).  This EUA will remain in effect (meaning this test can be used) for the duration of the COVID-19 declaration under Section 564(b)(1) of the Act, 21 U.S.C. section 360bbb-3(b)(1), unless the authorization is terminated or revoked sooner.  Performed at [facility], [address], [place], [place] [address]      Radiology Studies: No results found.   LOS: 9 days   Lanae Boast, MD [facility]  10/21/2019, 7:32 AM

## 2019-10-22 DIAGNOSIS — I5023 Acute on chronic systolic (congestive) heart failure: Secondary | ICD-10-CM | POA: Diagnosis not present

## 2019-10-22 LAB — BASIC METABOLIC PANEL
Anion gap: 10 (ref 5–15)
BUN: 32 mg/dL — ABNORMAL HIGH (ref 6–20)
CO2: 23 mmol/L (ref 22–32)
Calcium: 9.7 mg/dL (ref 8.9–10.3)
Chloride: 99 mmol/L (ref 98–111)
Creatinine, Ser: 1.28 mg/dL — ABNORMAL HIGH (ref 0.61–1.24)
GFR calc Af Amer: >60 mL/min (ref >60)
GFR calc non Af Amer: >60 mL/min (ref >60)
Glucose, Bld: 95 mg/dL (ref 70–99)
Potassium: 4.8 mmol/L (ref 3.5–5.1)
Sodium: 132 mmol/L — ABNORMAL LOW (ref 135–145)

## 2019-10-22 LAB — GLUCOSE, CAPILLARY
Glucose-Capillary: 111 mg/dL — ABNORMAL HIGH (ref 70–99)
Glucose-Capillary: 143 mg/dL — ABNORMAL HIGH (ref 70–99)
Glucose-Capillary: 78 mg/dL (ref 70–99)
Glucose-Capillary: 111 mg/dL — ABNORMAL HIGH (ref 70–99)

## 2019-10-22 MED ORDER — THIAMINE HCL 100 MG PO TABS
100.0000 mg | ORAL_TABLET | Freq: Every day | ORAL | Status: DC
  Administered 2019-10-24 – 2019-11-02 (×10): 100 mg via ORAL
  Filled 2019-10-22 (×10): qty 1

## 2019-10-22 NOTE — Progress Notes (Signed)
Physical Therapy Treatment Patient Details Name: Kerry Hamilton MRN: [id number] DOB: [birth year] Today's Date: 10/22/2019    History of Present Illness Pt is a 54 y.o. male admitted 10/11/19 with SOB and chest pain; chart states pt with ETOH and cocaine use for the past week. Workup for acute on chronic CHF secondary to nonischemic cardiomyopathy, suspect ETOH abuse. Worsening confusion noted 9/20-9/21; MRI with acute, moderate-sized infarct in R frontoparietal region. PMH includes substance abuse, DM2, CAD, depression.    PT Comments    Patient with flat affect and requires visual and tactile cues to initiate movement. Able to walk in room, however appeared dizzy and then continued to repeat the word "dizzy" once it was used by PT. Patient did demonstrate drop in SBP and incr HR with sit to stand (HR 62 128/79 to HR 82 105/77) and pt could not stand for 3 minute BP check (returned to sitting due to dizziness).    Follow Up Recommendations  Supervision/Assistance - 24 hour;SNF (vs long-term care)     Equipment Recommendations  Other (comment) (TBD)    Recommendations for Other Services       Precautions / Restrictions Precautions Precautions: Fall    Mobility  Bed Mobility                  Transfers Overall transfer level: Needs assistance Equipment used: 1 person hand held assist Transfers: Sit to/from Stand Sit to Stand: Min assist         General transfer comment: min assist x 3 reps; guiding assist as verbally cued and pt would engage  Ambulation/Gait Ambulation/Gait assistance: Min assist Gait Distance (Feet): 25 Feet (10, 10) Assistive device: 1 person hand held assist Gait Pattern/deviations: Wide base of support;Step-through pattern;Decreased stride length Gait velocity: Decreased   General Gait Details: guiding assist/facilitation along with verbal instructions   Stairs             Wheelchair Mobility    Modified Rankin (Stroke Patients  Only) Modified Rankin (Stroke Patients Only) Pre-Morbid Rankin Score: No symptoms Modified Rankin: Moderately severe disability     Balance Overall balance assessment: Needs assistance   Sitting balance-Leahy Scale: Fair     Standing balance support: No upper extremity supported Standing balance-Leahy Scale: Fair Standing balance comment: able to stand without support x 2.5 minutes while attempting orthostatic BPs                            Cognition Arousal/Alertness: Awake/alert Behavior During Therapy: Flat affect Overall Cognitive Status: No family/caregiver present to determine baseline cognitive functioning                                 General Comments: fragmented sentences able to express wants to call his sister (could not state her name; would not state his name). States he feels "wobbly" and "dizzy" (repeating words PT used; however does point to his head).       Exercises      General Comments General comments (skin integrity, edema, etc.): Pt with 23 mmHg drop in SBP and 20 beat increase in HR with sit to stand; unable to stand for 3 minute standing BP (stating dizzy and insisting on sitting down)      Pertinent Vitals/Pain Pain Assessment: Faces Faces Pain Scale: No hurt    Home Living  Prior Function            PT Goals (current goals can now be found in the care plan section) Acute Rehab PT Goals Patient Stated Goal: not stated Time For Goal Achievement: 10/31/19 Potential to Achieve Goals: Fair Progress towards PT goals: Progressing toward goals    Frequency    Min 2X/week      PT Plan Current plan remains appropriate    Co-evaluation              AM-PAC PT "6 Clicks" Mobility   Outcome Measure  Help needed turning from your back to your side while in a flat bed without using bedrails?: A Little Help needed moving from lying on your back to sitting on the side of a flat bed  without using bedrails?: A Lot Help needed moving to and from a bed to a chair (including a wheelchair)?: A Little Help needed standing up from a chair using your arms (e.g., wheelchair or bedside chair)?: A Little Help needed to walk in hospital room?: A Little Help needed climbing 3-5 steps with a railing? : A Lot 6 Click Score: 16    End of Session Equipment Utilized During Treatment: Gait belt Activity Tolerance: Treatment limited secondary to medical complications (Comment) (dizziness, orthostasis) Patient left: with call bell/phone within reach;in chair;with chair alarm set Nurse Communication: Mobility status (orthostasis) PT Visit Diagnosis: Other abnormalities of gait and mobility (R26.89);Muscle weakness (generalized) (M62.81);Other symptoms and signs involving the nervous system (R29.898)     Time: 4193-7902 PT Time Calculation (min) (ACUTE ONLY): 34 min  Charges:  $Gait Training: 23-37 mins                      [name], PT Pager [phone]    Zena Amos 10/22/2019, 1:59 PM

## 2019-10-22 NOTE — Progress Notes (Signed)
PROGRESS NOTE    Kerry Hamilton  BDZ:[id number] DOB: [birth year] DOA: 10/11/2019 PCP: Medicine, [facility]   Chief Complaint  Patient presents with   Chest Pain    Brief  Per chart: 54 year old man with EF 15%, DM 2 was admitted yesterday with shortness of breath and chest pain.  She had been drinking a lot and using crack for the last week.  Laboratory data was noted for decompensated heart failure and troponin around 25.Patient was treated with Lasix IV and seen by the advanced heart failure team.  On day 3 after admission patient was noted to be talking to himself and somewhat confused.  Patient had been on CIWA protocol and is thought to be in DTs. Ativan has been effective in controlling his agitation.  9/21:Continues to be confused, not following instruction moving upper extremities, CT head obtained that showed acute stroke and neurology was consulted and transferred to Neuro floor 3W. Neurology following, given his alcohol abuse letter cardiology question wet [place] Menghini s/p high-dose IV thiamine.  Patient completed stroke work-up.   Subjective:  Afebrile, blood pressure 90s to 110. He does not follow instructions, usually says "I cannot think" "it is bad", "I have a stroke" He is asking " what's wrong?" " I am weak" Intermittently speaking sentences short questions.  Gave me thumbs up sign.  Did not follow other instruction.  Assessment & Plan:  Acute on chronic systolic CHF 2/2 Nonischemic cardiomyopathy, ?Wet Beri beri from [place] abuse/thiamine deficiency-echo 5/20 EF 15% right/left HC 5/20-no CAD. Seen by cardiology no further work-up and not a candidate for advanced therapies given his poor compliance and substance abuse. his lvef is 15-20% currently.continue current medical treatment with Lasix 40 mg, Lanoxin, losartan and Aldactone.  Not on beta-blocker due to low EF.    Acute metabolic encephalopathy/ possible DTs, werneckie's? given his alcohol abuse- S/P high dose  thiamine as per neuro. MRI w/ acute stroke. Sister reports a history of schizophrenia and not on medication. Seen by psychiatry and they are going to follow-up closely for full assessment. Continue supportive measures, fall precaution, reorientation.  On 0.5 mg p.o. Ativan as needed for agitation.  Psych team advised to follow-up regularly but at this time they are signing off.  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 MRA no LVO or stenosis. MRI limited- mod sized acute ischemic infarct rt frontoparietal region, post Rt MCA. Carotid duplex no acute finding. Cont on Aspirin 81, Lipitor. Neurology following and has recommended-anticoagulation 7 to 10 days post stroke ( 9-27 to 9-30) given his severe cardiomyopathy with acute stroke and anticoagulation to be discontinued once LVEF >30-35%.  He has history of noncompliance substance abuse unclear how much compliance she will be. Once he is off anticoagulation neuro recommend 30-day cardiac event monitoring or loop recorder to rule out A. Fib: We will need to discuss with the stroke team on 9/29-9/30 regarding anticoagulation decision.  Polysubstance abuse:with cocaine, etoh.confirmed by patient's history and that he uses drugs and drinks alcohol.  TOC consult  History of schizophrenia not taking medication as per sister. Psychiatry consult appreciated and they are unable to do full assessment and will keep the patient in the consult list-and has advised to reconsult once he is medically cleared.  Mild AKI monitor closely on Lasix losartan Aldactone therapy creat ranging at 1.1-1.4.  Renal function holding stable. Recent Labs  Lab 10/18/19 1305 10/19/19 0242  10/20/19 0350 10/21/19 0443 10/22/19 0259  BUN 22* 23* 28* 27* 32*  CREATININE 1.41* 1.28* 1.34* 1.25* 1.28*   T2DM, noninsulin dependent. HBA1C 6.3 controlled.  Blood sugar is stable  continue sliding scale insulin. Recent Labs  Lab 10/21/19 0625 10/21/19 1224 10/21/19 1709 10/21/19 2158 10/22/19 0623  GLUCAP 102* 152* 102* 106* 111*    Elevated troponin/chest pain,troponin mildly elevated:Seen by cardiology no further invasive work-up planned likely demand ischemia.Stable cardiac cath in 2020.  Mild transaminitis abdomen ultrasound unrevealing,LFTs have improved.  DVT prophylaxis: enoxaparin (LOVENOX) injection 40 mg Start: 10/12/19 1600 Code Status:   Code Status: Full Code   Family Communication: plan of care discussed with patient and sister over the phone.  Discussed again at the bedside 9/23  Status CB:[id number] Remains inpatient appropriate because:Acute stroke, confusion, acute systolic CHF Dispo:The patient is from: Home-was living in a boarding home            Anticipated d/c is to: Cir evaluated-not a candidate for CIR as has no safe disposition post CIR and has recommended SNF first then ALF.Sister desires the same.              Anticipated d/c date is: Once bed available              Patient currently is not medically stable to d/c. Nutrition: Diet Order            Diet Carb Modified Fluid consistency: Thin; Room service appropriate? No  Diet effective now                 Body mass index is 21.29 kg/m. Consultants:see note  Procedures: TTE 9/17 1. Left ventricular ejection fraction, by estimation, is 15-20%. The left  ventricle has severely decreased function. The left ventricle demonstrates  global hypokinesis. The left ventricular internal cavity size was severely  dilated. Left ventricular  diastolic parameters are consistent with Grade III diastolic dysfunction  (restrictive). Elevated left atrial pressure.  2. Right ventricular systolic function is severely reduced. The right  ventricular size is severely enlarged. There is moderately elevated  pulmonary artery systolic pressure. The estimated right ventricular  systolic pressure  is 54.8 mmHg.  3. Left atrial size was severely dilated.  4. Right atrial size was moderately dilated.  5. Severe mitral regurgitation. Restricted PMVL in systole due to dilated  cardiomyopathy (IIIb). The mitral valve is abnormal. Moderate to severe  mitral valve regurgitation.  6. Tricuspid valve regurgitation is moderate to severe.  7. The aortic valve is tricuspid. Aortic valve regurgitation is not  visualized. No aortic stenosis is present.  8. The inferior vena cava is normal in size with <50% respiratory  variability, suggesting right atrial pressure of 8 mmHg  MRI HEAD IMPRESSION: 9/21 1.Limited exam due to the patient's inability to tolerate the full length of the study. Diffusion-weighted imaging only was performed. 2. Moderate-sized acute ischemic infarct involving the right frontoparietal region, posterior right MCA distribution. No associated mass effect. 3. No other definite acute intracranial abnormality.  MRA HEAD IMPRESSION: 9/21 Negative intracranial MRA. No large vessel occlusion or hemodynamically significant stenosis. No aneurysm  Microbiology:see note Blood Culture    Component Value Date/Time   SDES  06/10/2018 1803    BLOOD RIGHT FOREARM Performed at [facility], [address] [place], [place] [address]    SPECREQUEST  06/10/2018 1803    BOTTLES DRAWN AEROBIC ONLY Blood Culture results may not be optimal due to an inadequate volume of  blood received in culture bottles Performed at [facility], [address], [place], [place] [address]    CULT  06/10/2018 1803    NO GROWTH 5 DAYS Performed at [facility], [address], [place], [place] [address]    REPTSTATUS 06/16/2018 FINAL 06/10/2018 1803    Other culture-see note  Medications: Scheduled Meds:  aspirin  81 mg Oral Daily   atorvastatin  40 mg Oral QHS   digoxin  0.125 mg Oral Daily   enoxaparin (LOVENOX) injection  40 mg Subcutaneous Q24H    folic acid  1 mg Oral Daily   furosemide  40 mg Oral Daily   insulin aspart  0-5 Units Subcutaneous QHS   insulin aspart  0-9 Units Subcutaneous TID WC   losartan  25 mg Oral Daily   multivitamin with minerals  1 tablet Oral Daily   sodium chloride flush  3 mL Intravenous Q12H   spironolactone  25 mg Oral Daily   thiamine  100 mg Oral Daily   Continuous Infusions:  sodium chloride 10 mL/hr at 10/17/19 2317   thiamine injection Stopped (10/21/19 1022)   Antimicrobials: Anti-infectives (From admission, onward)   None     Objective: Vitals: Today's Vitals   10/21/19 1904 10/21/19 2015 10/21/19 2317 10/22/19 0408  BP:  100/61 (!) 94/58 110/70  Pulse:  (!) 56 60 60  Resp:  20 18 18   Temp:  97.6 F (36.4 C) 97.6 F (36.4 C) (!) 97.5 F (36.4 C)  TempSrc:  Oral Oral Oral  SpO2:  99% 99% 99%  Weight:      Height:      PainSc: Asleep       Intake/Output Summary (Last 24 hours) at 10/22/2019 1119 Last data filed at 10/22/2019 0818 Gross per 24 hour  Intake 946 ml  Output 500 ml  Net 446 ml   Filed Weights   10/20/19 0500 10/21/19 0316 10/21/19 0650  Weight: 74 kg 73.4 kg 71.2 kg   Weight change:   Intake/Output from previous day: 09/26 0701 - 09/27 0700 In: 710 [P.O.:610; IV Piggyback:100] Out: 500 [Urine:500] Intake/Output this shift: Total I/O In: 236 [P.O.:236] Out: -   Examination:  General exam: AA, NAD, weak appearing. HEENT:Oral mucosa moist, Ear/Nose WNL grossly, dentition normal. Respiratory system: bilaterally clear,no wheezing or crackles,no use of accessory muscle Cardiovascular system: S1 & S2 +, No JVD,. Gastrointestinal system: Abdomen soft, NT,ND, BS+ Nervous System:Alert, awake,  able to speak full sentence words at times but does not follow commands or instructions- dysarthric, moving extremities and grossly nonfocal Extremities: No edema, distal peripheral pulses palpable.  Skin: No rashes,no icterus. MSK: Normal muscle bulk,tone,  power    Data Reviewed: I have personally reviewed following labs and imaging studies CBC: Recent Labs  Lab 10/17/19 0435 10/18/19 1305 10/19/19 0242  WBC 4.3 3.2* 3.5*  HGB 15.9 15.0 15.5  HCT 49.5 47.0 48.6  MCV 87.0 85.3 84.8  PLT 224 255 253   Basic Metabolic Panel: Recent Labs  Lab 10/18/19 1305 10/19/19 0242 10/20/19 0350 10/21/19 0443 10/22/19 0259  NA 134* 134* 133* 132* 132*  K 4.6 4.4 4.6 4.6 4.8  CL 102 100 101 100 99  CO2 22 23 23 24 23   GLUCOSE 171* 101* 114* 88 95  BUN 22* 23* 28* 27* 32*  CREATININE 1.41* 1.28* 1.34* 1.25* 1.28*  CALCIUM 9.4 9.4 9.6 9.7 9.7   GFR: Estimated Creatinine Clearance: 67.2 mL/min (A) (by C-G formula based  on SCr of 1.28 mg/dL (H)). Liver Function Tests: Recent Labs  Lab 10/17/19 0435 10/18/19 1305 10/19/19 0242  AST 31 34 32  ALT 51* 47* 47*  ALKPHOS 111 111 116  BILITOT 0.5 0.8 0.8  PROT 7.3 7.0 7.4  ALBUMIN 3.5 3.3* 3.6   No results for input(s): LIPASE, AMYLASE in the last 168 hours. No results for input(s): AMMONIA in the last 168 hours. Coagulation Profile: No results for input(s): INR, PROTIME in the last 168 hours. Cardiac Enzymes: No results for input(s): CKTOTAL, CKMB, CKMBINDEX, TROPONINI in the last 168 hours. BNP (last 3 results) No results for input(s): PROBNP in the last 8760 hours. HbA1C: No results for input(s): HGBA1C in the last 72 hours. CBG: Recent Labs  Lab 10/21/19 0625 10/21/19 1224 10/21/19 1709 10/21/19 2158 10/22/19 0623  GLUCAP 102* 152* 102* 106* 111*   Lipid Profile: No results for input(s): CHOL, HDL, LDLCALC, TRIG, CHOLHDL, LDLDIRECT in the last 72 hours. Thyroid Function Tests: No results for input(s): TSH, T4TOTAL, FREET4, T3FREE, THYROIDAB in the last 72 hours. Anemia Panel: No results for input(s): VITAMINB12, FOLATE, FERRITIN, TIBC, IRON, RETICCTPCT in the last 72 hours. Sepsis Labs: No results for input(s): PROCALCITON, LATICACIDVEN in the last 168 hours.  No  results found for this or any previous visit (from the past 240 hour(s)).   Radiology Studies: No results found.   LOS: 10 days   Lanae Boast, MD [facility]  10/22/2019, 11:19 AM

## 2019-10-23 DIAGNOSIS — I5023 Acute on chronic systolic (congestive) heart failure: Secondary | ICD-10-CM | POA: Diagnosis not present

## 2019-10-23 LAB — GLUCOSE, CAPILLARY
Glucose-Capillary: 113 mg/dL — ABNORMAL HIGH (ref 70–99)
Glucose-Capillary: 146 mg/dL — ABNORMAL HIGH (ref 70–99)
Glucose-Capillary: 92 mg/dL (ref 70–99)
Glucose-Capillary: 117 mg/dL — ABNORMAL HIGH (ref 70–99)

## 2019-10-23 MED ORDER — APIXABAN 5 MG PO TABS
5.0000 mg | ORAL_TABLET | Freq: Two times a day (BID) | ORAL | Status: DC
  Administered 2019-10-23 – 2019-11-02 (×21): 5 mg via ORAL
  Filled 2019-10-23 (×21): qty 1

## 2019-10-23 NOTE — Progress Notes (Signed)
  Speech Language Pathology Treatment: Cognitive-Linquistic  Patient Details Name: Kerry Hamilton MRN: [id number] DOB: 1965/12/17 Today's Date: 10/23/2019 Time: 8469-6295 SLP Time Calculation (min) (ACUTE ONLY): 40 min  Assessment / Plan / Recommendation Clinical Impression  Patient seen for aphasia treatment. Upon SLP entering room, patient said, "pain, pain" and when asked where, finally was able to say "bottom...foot...pain....hurts". He was not able to respond to SLP when asked if he needed anything for pain even when given options. Affect was flat, speech was abrupt and consisted of 1-2 word sometimes longer phrases "cant think....cause the stroke", "headache, cant focal...focus", etc. He responded to SLP's questions to elaborate on things he said with mod-maximal cues. He abruptly sat up and said, "gotta pee" and despite SLP providing verbal and tactile cues for patient to wait, he walked to bathroom and stood to urinate with SLP holding his IV pole. Patient did not respond or comply when SLP attempting to cue him to wash his hands. He then said "wobbly" but unable to elaborate when asked. He required maximal cues, verbal, tactile and physical cues to initiate movements to stand up, sit down and lie down. Verbal and visual cues were not successful in having him stand up so SLP could get IV tubing that he was sitting on, but when SLP put RW in front of him and moved his hands to walker handles, he did stand up. After getting into bed with SLP assist, patient was signfiicantly less responsive, not reacting at all when SLP showed him his shoes that he had commented on earlier ("cant find my shoes"). As SLP was leaving, he said, "is it serveal, ser-severe stroke?"   HPI HPI: Pt is a 54 y.o. male admitted 10/11/19 with SOB and chest pain; chart states pt with ETOH and cocaine use for the past week. Workup for acute on chronic CHF secondary to nonischemic cardiomyopathy, suspect ETOH abuse. Worsening confusion  noted 9/20-9/21; MRI with acute, moderate-sized infarct in R frontoparietal region. PMH includes substance abuse, DM2, CAD, depression.      SLP Plan  Continue with current plan of care       Recommendations   SNF                Oral Care Recommendations: Oral care BID Follow up Recommendations: Skilled Nursing facility;24 hour supervision/assistance SLP Visit Diagnosis: Aphasia (R47.01) Plan: Continue with current plan of care       GO               Angela Nevin, MA, CCC-SLP Speech Therapy [facility] Re[facility]

## 2019-10-23 NOTE — Progress Notes (Signed)
Occupational Therapy Treatment Patient Details Name: Kerry Hamilton MRN: [id number] DOB: 08-Mar-1965 Today's Date: 10/23/2019    History of present illness Pt is a 54 y.o. male admitted 10/11/19 with SOB and chest pain; chart states pt with ETOH and cocaine use for the past week. Workup for acute on chronic CHF secondary to nonischemic cardiomyopathy, suspect ETOH abuse. Worsening confusion noted 9/20-9/21; MRI with acute, moderate-sized infarct in R frontoparietal region. PMH includes substance abuse, DM2, CAD, depression.   OT comments  Pt noted to have visual and cognitive deficits. Pt with decreased initiation for task even when requesting "cold drink" pt very internally distracted at times. Pt perseverates on task and even demonstrates echolia at times during session. Pt remains appropriate for CIR at this time.   Follow Up Recommendations  CIR    Equipment Recommendations  None recommended by OT    Recommendations for Other Services Rehab consult    Precautions / Restrictions Precautions Precautions: Fall       Mobility Bed Mobility Overal bed mobility: Needs Assistance Bed Mobility: Supine to Sit;Sit to Supine     Supine to sit: Min assist Sit to supine: Max assist   General bed mobility comments: requires (A) to initiate both transfer in and out. pt requires bil LE on bed surface  Transfers Overall transfer level: Needs assistance Equipment used: 1 person hand held assist Transfers: Sit to/from Stand Sit to Stand: Min assist         General transfer comment: pt needs (A) to maintain gait speed and progress forward    Balance Overall balance assessment: Needs assistance Sitting-balance support: Bilateral upper extremity supported;Feet supported Sitting balance-Leahy Scale: Fair     Standing balance support: Single extremity supported;During functional activity Standing balance-Leahy Scale: Fair                             ADL either performed or  assessed with clinical judgement   ADL Overall ADL's : Needs assistance/impaired Eating/Feeding: Moderate assistance;Sitting Eating/Feeding Details (indicate cue type and reason): drinking from cup with mod/ max cues with L UE.                  Lower Body Dressing: Total assistance Lower Body Dressing Details (indicate cue type and reason): does not initiate or follow commands for don of either sock    Toilet Transfer Details (indicate cue type and reason): RN reports pt impulsive transfer to the bathroom.          Functional mobility during ADLs: Minimal assistance General ADL Comments: pt motivated to get a soda and transfer back to room. pt c/o whoozy whoozy. pt with increased blinking. pt noted to have BP 121/91 HR 67 when obtained. pt with sitting prolonged >3 minutes 104/83     Vision   Vision Assessment?: Vision impaired- to be further tested in functional context Additional Comments: pt tilting head to Right / chin to the left to write name. pt expressing frustration with errors writing name. pt states "damn" pt attempting in cursive twice then in print. pt able to write Nedra Hai with errors for "Eppolito x3" . pt asked to write birthday and perseverating on name   Perception     Praxis      Cognition Arousal/Alertness: Awake/alert Behavior During Therapy: Flat affect Overall Cognitive Status: No family/caregiver present to determine baseline cognitive functioning  General Comments: pt states "cold drink. cold drink" upon OT arrival. pt needs max cues at times for helping patient gain what he is requesting. pt ambulated to Nursing station by the motivation of a "cold soda" pt presented with a full cabinet of soda cans and asked to pick one. pt does not scan visually to see the cans. Pt auditory hears "diet coke" and states "coke coke" Pt presented with soda in central vision and does not reach for soda. pt needs cues to take then  immediately drinking. Questions visual deficits in addition to cognition        Exercises     Shoulder Instructions       General Comments      Pertinent Vitals/ Pain       Pain Assessment: Faces Faces Pain Scale: Hurts a little bit Pain Location: whoozy Pain Descriptors / Indicators: Discomfort Pain Intervention(s): Monitored during session;Repositioned  Home Living                                          Prior Functioning/Environment              Frequency  Min 2X/week        Progress Toward Goals  OT Goals(current goals can now be found in the care plan section)  Progress towards OT goals: Progressing toward goals  Acute Rehab OT Goals Patient Stated Goal: cold drink  OT Goal Formulation: Patient unable to participate in goal setting Time For Goal Achievement: 10/31/19 Potential to Achieve Goals: Good ADL Goals Pt Will Perform Grooming: with supervision;standing Pt Will Perform Upper Body Dressing: with supervision;sitting Pt Will Perform Lower Body Dressing: with supervision;sit to/from stand Pt Will Transfer to Toilet: with supervision;ambulating Pt Will Perform Toileting - Clothing Manipulation and hygiene: with supervision;sit to/from stand Additional ADL Goal #1: Pt will initiate task without cues and attend through task completion 50% of the time  Plan Discharge plan remains appropriate    Co-evaluation                 AM-PAC OT "6 Clicks" Daily Activity     Outcome Measure   Help from another person eating meals?: A Little Help from another person taking care of personal grooming?: A Little Help from another person toileting, which includes using toliet, bedpan, or urinal?: A Lot Help from another person bathing (including washing, rinsing, drying)?: A Lot Help from another person to put on and taking off regular upper body clothing?: A Little Help from another person to put on and taking off regular lower body  clothing?: A Lot 6 Click Score: 15    End of Session    OT Visit Diagnosis: Unsteadiness on feet (R26.81);Muscle weakness (generalized) (M62.81)   Activity Tolerance Patient tolerated treatment well   Patient Left in bed;with call bell/phone within reach;with bed alarm set   Nurse Communication Mobility status;Weight bearing status        Time: 4496-7591 OT Time Calculation (min): 27 min  Charges: OT General Charges $OT Visit: 1 Visit OT Treatments $Self Care/Home Management : 23-37 mins   Brynn, OTR/L  Acute Rehabilitation Services Pager: [phone] Office: [phone] .    Mateo Flow 10/23/2019, 3:23 PM

## 2019-10-23 NOTE — Discharge Instructions (Signed)

## 2019-10-23 NOTE — Progress Notes (Signed)
PROGRESS NOTE    Kerry Hamilton  PIR:[id number] DOB: Aug 06, 1965 DOA: 10/11/2019 PCP: Medicine, [facility]   Chief Complaint  Patient presents with  . Chest Pain    Brief  Per chart: 54 year old man with EF 15%, DM 2 was admitted yesterday with shortness of breath and chest pain.  She had been drinking a lot and using crack for the last week.  Laboratory data was noted for decompensated heart failure and troponin around 25.Patient was treated with Lasix IV and seen by the advanced heart failure team.  On day 3 after admission patient was noted to be talking to himself and somewhat confused.  Patient had been on CIWA protocol and is thought to be in DTs. Ativan has been effective in controlling his agitation.  9/21:Continues to be confused, not following instruction moving upper extremities, CT head obtained that showed acute stroke and neurology was consulted and transferred to Neuro floor 3W. Neurology following, given his alcohol abuse letter cardiology question wet Mahala Menghini s/p high-dose IV thiamine.  Patient completed stroke work-up.   Subjective: Appears more alert awake today. Able to follow some of my commands and moves his extremities with instruction. Patient remains dysarthric intermittently speaking in sentences not consistently following commands.  Able to ambulate with assistance. Midodrine with assistance. BP stable, afebrile.  Assessment & Plan:  Acute on chronic systolic CHF 2/2 Nonischemic cardiomyopathy, ?Wet Beri beri from [place] abuse/thiamine deficiency-echo 5/20 EF 15% right/left HC 5/20-no CAD. Seen by cardiology no further work-up and not a candidate for advanced therapies given his poor compliance and substance abuse. his lvef is 15-20% currently.continue current medical therapy with  Lasix 40 mg, Lanoxin, losartan and Aldactone.Not on beta-blocker due to low EF.    Acute metabolic encephalopathy/ possible DTs, werneckie's? given his alcohol abuse- S/P high  dose thiamine as per neuro. MRI w/ acute stroke. Sister reports a history of schizophrenia and not on medication. Seen by psychiatry and they are going to follow-up closely for full assessment. Continue supportive measures, fall precaution, reorientation.  On 0.5 mg p.o. Ativan as needed for agitation.  Psych team advised to follow-up regularly but at this time they are signing off.  Mental status seems to be improving following more commands now.  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 MRA no LVO or stenosis. MRI limited- mod sized acute ischemic infarct rt frontoparietal region, post Rt MCA. Carotid duplex no acute finding. Cont on Aspirin 81, Lipitor. Neurology following and has recommended-anticoagulation 7 to 10 days post stroke ( 9-27 to 9-30) given his severe cardiomyopathy with acute stroke and anticoagulation to be discontinued once LVEF >30-35%.  He has history of noncompliance substance abuse unclear how much compliance she will be. Once he is off anticoagulation neuro recommend 30-day cardiac event monitoring or loop recorder to rule out A. Fib: Discussed with Dr. Pearlean Brownie advised to start Eliquis 5 mg twice daily today and DC aspirin.  Polysubstance abuse:with cocaine, etoh.confirmed by patient's history and that he uses drugs and drinks alcohol.  Seen by Child psychotherapist.  History of schizophrenia not taking medication as per sister. Psychiatric input appreciatedthey are unable to do full assessment and will keep the patient in the consult list-and has advised to reconsult once he is medically cleared.  Mild AKI : With a stable renal function.  Creat at 1-1.4.  Monitor.   Recent Labs  Lab 10/18/19 1305 10/19/19 0242  10/20/19 0350 10/21/19 0443 10/22/19 0259  BUN 22* 23* 28* 27* 32*  CREATININE 1.41* 1.28* 1.34* 1.25* 1.28*   T2DM, noninsulin dependent. HBA1C 6.3.  Blood sugars  controlled on sliding scale.  Recent Labs  Lab 10/22/19 1158 10/22/19 1543 10/22/19 2202 10/23/19 0612 10/23/19 1126  GLUCAP 143* 78 111* 113* 146*    Elevated troponin/chest pain,troponin mildly elevated: Seen by cardiology no further ischemic work-up planned as likely demand mismatch.Stable cardiac cath in 2020.  Mild transaminitis abdomen ultrasound unrevealing,LFTs have improved.  DVT prophylaxis:ELIQUIS Code Status:   Code Status: Full Code   Family Communication: plan of care discussed with patient's sisterpreviously  Status KN:[id number] Remains inpatient appropriate because:Acute stroke, confusion, acute systolic CHF Dispo:The patient is from: Home-was living in a boarding home            Anticipated d/c is to: Cir evaluated-not a candidate for CIR as has no safe disposition post CIR and has recommended SNF first then ALF.Sister desires the same.              Anticipated d/c date is: Once bed available              Patient currently is not medically stable to d/c. Nutrition: Diet Order            Diet Carb Modified Fluid consistency: Thin; Room service appropriate? No  Diet effective now                 Body mass index is 21.59 kg/m. Consultants:see note  Procedures: TTE 9/17 1. Left ventricular ejection fraction, by estimation, is 15-20%. The left  ventricle has severely decreased function. The left ventricle demonstrates  global hypokinesis. The left ventricular internal cavity size was severely  dilated. Left ventricular  diastolic parameters are consistent with Grade III diastolic dysfunction  (restrictive). Elevated left atrial pressure.  2. Right ventricular systolic function is severely reduced. The right  ventricular size is severely enlarged. There is moderately elevated  pulmonary artery systolic pressure. The estimated right ventricular  systolic pressure is 54.8 mmHg.  3. Left atrial size was severely dilated.  4. Right atrial size was  moderately dilated.  5. Severe mitral regurgitation. Restricted PMVL in systole due to dilated  cardiomyopathy (IIIb). The mitral valve is abnormal. Moderate to severe  mitral valve regurgitation.  6. Tricuspid valve regurgitation is moderate to severe.  7. The aortic valve is tricuspid. Aortic valve regurgitation is not  visualized. No aortic stenosis is present.  8. The inferior vena cava is normal in size with <50% respiratory  variability, suggesting right atrial pressure of 8 mmHg  MRI HEAD IMPRESSION: 9/21 1.Limited exam due to the patient's inability to tolerate the full length of the study. Diffusion-weighted imaging only was performed. 2. Moderate-sized acute ischemic infarct involving the right frontoparietal region, posterior right MCA distribution. No associated mass effect. 3. No other definite acute intracranial abnormality.  MRA HEAD IMPRESSION: 9/21 Negative intracranial MRA. No large vessel occlusion or hemodynamically significant stenosis. No aneurysm  Microbiology:see note Blood Culture    Component Value Date/Time   SDES  06/10/2018 1803    BLOOD RIGHT FOREARM Performed at [facility], [address], [place], [place] [address]    SPECREQUEST  06/10/2018 1803    BOTTLES DRAWN AEROBIC ONLY Blood Culture results may not be optimal due to an inadequate volume of blood received in culture bottles Performed at [facility], [address] [place], [place] [address]  CULT  06/10/2018 1803    NO GROWTH 5 DAYS Performed at [facility], [address] [place], [place] [address]    REPTSTATUS 06/16/2018 FINAL 06/10/2018 1803    Other culture-see note  Medications: Scheduled Meds: . apixaban  5 mg Oral BID  . atorvastatin  40 mg Oral QHS  . digoxin  0.125 mg Oral Daily  . folic acid  1 mg Oral Daily  . furosemide  40 mg Oral Daily  . insulin aspart  0-5 Units Subcutaneous QHS  . insulin aspart  0-9 Units Subcutaneous  TID WC  . losartan  25 mg Oral Daily  . multivitamin with minerals  1 tablet Oral Daily  . sodium chloride flush  3 mL Intravenous Q12H  . spironolactone  25 mg Oral Daily  . [START ON 10/24/2019] thiamine  100 mg Oral Daily   Continuous Infusions: . sodium chloride 10 mL/hr at 10/17/19 2317   Antimicrobials: Anti-infectives (From admission, onward)   None     Objective: Vitals: Today's Vitals   10/23/19 0338 10/23/19 0451 10/23/19 0859 10/23/19 1123  BP: 105/79  107/71 107/74  Pulse: 64  70 66  Resp: 17  17 17   Temp: (!) 97.5 F (36.4 C)  97.9 F (36.6 C) 98 F (36.7 C)  TempSrc: Oral  Oral Oral  SpO2: 100%  99% 99%  Weight:  72.2 kg    Height:      PainSc:        Intake/Output Summary (Last 24 hours) at 10/23/2019 1347 Last data filed at 10/23/2019 0600 Gross per 24 hour  Intake 340 ml  Output 850 ml  Net -510 ml   Filed Weights   10/21/19 0316 10/21/19 0650 10/23/19 0451  Weight: 73.4 kg 71.2 kg 72.2 kg   Weight change:   Intake/Output from previous day: 09/27 0701 - 09/28 0700 In: 576 [P.O.:476; IV Piggyback:100] Out: 850 [Urine:850] Intake/Output this shift: No intake/output data recorded.  Examination: General exam: AA, followed some commands today, NAD, weak appearing. HEENT:Oral mucosa moist, Ear/Nose WNL grossly, dentition normal. Respiratory system: bilaterally clear,no wheezing or crackles,no use of accessory muscle Cardiovascular system: S1 & S2 +, No JVD,. Gastrointestinal system: Abdomen soft, NT,ND, BS+ Nervous System:Alert, awake, moving extremities and grossly nonfocal, following some commands, , speaking some sentence. Extremities: No edema, distal peripheral pulses palpable.  Skin: No rashes,no icterus. MSK: Normal muscle bulk,tone, power  Data Reviewed: I have personally reviewed following labs and imaging studies CBC: Recent Labs  Lab 10/17/19 0435 10/18/19 1305 10/19/19 0242  WBC 4.3 3.2* 3.5*  HGB 15.9 15.0 15.5  HCT 49.5  47.0 48.6  MCV 87.0 85.3 84.8  PLT 224 255 253   Basic Metabolic Panel: Recent Labs  Lab 10/18/19 1305 10/19/19 0242 10/20/19 0350 10/21/19 0443 10/22/19 0259  NA 134* 134* 133* 132* 132*  K 4.6 4.4 4.6 4.6 4.8  CL 102 100 101 100 99  CO2 22 23 23 24 23   GLUCOSE 171* 101* 114* 88 95  BUN 22* 23* 28* 27* 32*  CREATININE 1.41* 1.28* 1.34* 1.25* 1.28*  CALCIUM 9.4 9.4 9.6 9.7 9.7   GFR: Estimated Creatinine Clearance: 68.2 mL/min (A) (by C-G formula based on SCr of 1.28 mg/dL (H)). Liver Function Tests: Recent Labs  Lab 10/17/19 0435 10/18/19 1305 10/19/19 0242  AST 31 34 32  ALT 51* 47* 47*  ALKPHOS 111 111 116  BILITOT 0.5 0.8 0.8  PROT 7.3 7.0 7.4  ALBUMIN 3.5 3.3* 3.6  No results for input(s): LIPASE, AMYLASE in the last 168 hours. No results for input(s): AMMONIA in the last 168 hours. Coagulation Profile: No results for input(s): INR, PROTIME in the last 168 hours. Cardiac Enzymes: No results for input(s): CKTOTAL, CKMB, CKMBINDEX, TROPONINI in the last 168 hours. BNP (last 3 results) No results for input(s): PROBNP in the last 8760 hours. HbA1C: No results for input(s): HGBA1C in the last 72 hours. CBG: Recent Labs  Lab 10/22/19 1158 10/22/19 1543 10/22/19 2202 10/23/19 0612 10/23/19 1126  GLUCAP 143* 78 111* 113* 146*   Lipid Profile: No results for input(s): CHOL, HDL, LDLCALC, TRIG, CHOLHDL, LDLDIRECT in the last 72 hours. Thyroid Function Tests: No results for input(s): TSH, T4TOTAL, FREET4, T3FREE, THYROIDAB in the last 72 hours. Anemia Panel: No results for input(s): VITAMINB12, FOLATE, FERRITIN, TIBC, IRON, RETICCTPCT in the last 72 hours. Sepsis Labs: No results for input(s): PROCALCITON, LATICACIDVEN in the last 168 hours.  No results found for this or any previous visit (from the past 240 hour(s)).   Radiology Studies: No results found.   LOS: 11 days   Lanae Boast, MD [facility]  10/23/2019, 1:47 PM

## 2019-10-24 DIAGNOSIS — I5023 Acute on chronic systolic (congestive) heart failure: Secondary | ICD-10-CM | POA: Diagnosis not present

## 2019-10-24 LAB — GLUCOSE, CAPILLARY
Glucose-Capillary: 109 mg/dL — ABNORMAL HIGH (ref 70–99)
Glucose-Capillary: 234 mg/dL — ABNORMAL HIGH (ref 70–99)
Glucose-Capillary: 147 mg/dL — ABNORMAL HIGH (ref 70–99)
Glucose-Capillary: 114 mg/dL — ABNORMAL HIGH (ref 70–99)

## 2019-10-24 NOTE — Progress Notes (Signed)
Per MD ok for patient to not have an IV.

## 2019-10-24 NOTE — Progress Notes (Signed)
PROGRESS NOTE    Kerry Hamilton  HER:[id number] DOB: 28-Feb-1965 DOA: 10/11/2019 PCP: Medicine, [facility]   Chief Complaint  Patient presents with  . Chest Pain    Brief  Per chart: 54 year old man with EF 15%, DM 2 was admitted yesterday with shortness of breath and chest pain.  She had been drinking a lot and using crack for the last week.  Laboratory data was noted for decompensated heart failure and troponin around 25.Patient was treated with Lasix IV and seen by the advanced heart failure team.  On day 3 after admission patient was noted to be talking to himself and somewhat confused.  Patient had been on CIWA protocol and is thought to be in DTs. Ativan has been effective in controlling his agitation.  9/21:Continues to be confused, not following instruction moving upper extremities, CT head obtained that showed acute stroke and neurology was consulted and transferred to Neuro floor 3W. Neurology following, given his alcohol abuse letter cardiology question wet Mahala Menghini s/p high-dose IV thiamine.  Patient completed stroke work-up. 9/28: started on eliquis since 7-10 days post stroke as per Dr Pearlean Brownie given his low LVEF and advised to continue on treatment EF is> 30-35% Patient remains dysarthric, at times impulsive, on low-dose Ativan orally but overall improving following more commands and continues to require extensive rehabilitation moving forward  Subjective: He was trying to speak some dysarthria present-he was trying to say his uncle had brain aneurysm. No acute events overnight.  On low-dose Ativan orally for anxiety.   Blood pressure stable. Continues to be dysarthric intermittently speaking in sentences and is aware that he had a stroke.  Assessment & Plan:  Acute on chronic systolic CHF 2/2 Nonischemic cardiomyopathy, ?Wet Beri beri from [place] abuse/thiamine deficiency-echo 5/20 EF 15% right/left HC 5/20-no CAD. Seen by cardiology no further work-up and not a  candidate for advanced therapies given his poor compliance and substance abuse. his lvef is 15-20% currently.continue current medical therapy with  Lasix 40 mg, Lanoxin, losartan and Aldactone.Not on beta-blocker due to low EF.  Will check labs in am   Acute metabolic encephalopathy/ possible DTs, werneckie's? given his alcohol abuse- S/P high dose thiamine as per neuro. MRI w/ acute stroke. Sister reports a history of schizophrenia and not on medication. Seen by psychiatry and they are going to follow-up closely for full assessment. Continue supportive measures, fall precaution, reorientation.  Seems to be improving,vocalizing more, but still with dysarthria. Continue on 0.5 mg p.o. Ativan as needed for agitation/anxiety.  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 MRA no LVO or stenosis. MRI limited- mod sized acute ischemic infarct rt frontoparietal region, post Rt MCA. Carotid duplex no acute finding. Cont on Aspirin 81, Lipitor. Neurology following and has recommended-anticoagulation 7 to 10 days post stroke ( 9-27 to 9-30) given his severe cardiomyopathy with acute stroke and anticoagulation to be discontinued once LVEF >30-35%.Once he is off anticoagulation neuro recommend 30-day cardiac event monitoring or loop recorder to rule out A. Fib: Discussed with Dr. Pearlean Brownie 9/28 and started on  Eliquis 5 mg twice daily today and discontinued aspirin.  Polysubstance abuse:with cocaine, etoh.confirmed by patient's history and that he uses drugs and drinks alcohol.  TOC was consulted.  History of schizophrenia not taking medication as per sister. Psychiatric input appreciatedthey are unable to do full assessment and will keep the patient in the consult list-and has advised to  reconsult once he is medically cleared.  Mild AKI : With a stable renal function.  Creat at 1-1.4.  Monitor intermittently. Recent  Labs  Lab 10/18/19 1305 10/19/19 0242 10/20/19 0350 10/21/19 0443 10/22/19 0259  BUN 22* 23* 28* 27* 32*  CREATININE 1.41* 1.28* 1.34* 1.25* 1.28*   T2DM, noninsulin dependent. HBA1C 6.3.  Blood sugar stable on sliding scale insulin. Recent Labs  Lab 10/23/19 0612 10/23/19 1126 10/23/19 1608 10/23/19 2131 10/24/19 0630  GLUCAP 113* 146* 92 117* 109*    Elevated troponin/chest pain,troponin mildly elevated: Seen by cardiology no further ischemic work-up.  Apparently had a stable cardiac cath in 2020  Mild transaminitis abdomen ultrasound unrevealing,LFTs have improved.  DVT prophylaxis:ELIQUIS Code Status:   Code Status: Full Code   Family Communication: plan of care discussed with patient's sisterpreviously  Status BD:[id number] Remains inpatient appropriate because:Acute stroke, confusion, acute systolic CHF Dispo:The patient is from: Home-was living in a boarding home            Anticipated d/c is to: Cir evaluated-not a candidate for CIR as has no safe disposition post CIR and has recommended SNF first then ALF.Sister desires the same.              Anticipated d/c date is: Once bed available              Patient currently is not medically stable to d/c. Nutrition: Diet Order            Diet Carb Modified Fluid consistency: Thin; Room service appropriate? No  Diet effective now                 Body mass index is 22.24 kg/m. Consultants:see note  Procedures: TTE 9/17 1. Left ventricular ejection fraction, by estimation, is 15-20%. The left  ventricle has severely decreased function. The left ventricle demonstrates  global hypokinesis. The left ventricular internal cavity size was severely  dilated. Left ventricular  diastolic parameters are consistent with Grade III diastolic dysfunction  (restrictive). Elevated left atrial pressure.  2. Right ventricular systolic function is severely reduced. The right  ventricular size is severely enlarged. There is  moderately elevated  pulmonary artery systolic pressure. The estimated right ventricular  systolic pressure is 54.8 mmHg.  3. Left atrial size was severely dilated.  4. Right atrial size was moderately dilated.  5. Severe mitral regurgitation. Restricted PMVL in systole due to dilated  cardiomyopathy (IIIb). The mitral valve is abnormal. Moderate to severe  mitral valve regurgitation.  6. Tricuspid valve regurgitation is moderate to severe.  7. The aortic valve is tricuspid. Aortic valve regurgitation is not  visualized. No aortic stenosis is present.  8. The inferior vena cava is normal in size with <50% respiratory  variability, suggesting right atrial pressure of 8 mmHg  MRI HEAD IMPRESSION: 9/21 1.Limited exam due to the patient's inability to tolerate the full length of the study. Diffusion-weighted imaging only was performed. 2. Moderate-sized acute ischemic infarct involving the right frontoparietal region, posterior right MCA distribution. No associated mass effect. 3. No other definite acute intracranial abnormality.  MRA HEAD IMPRESSION: 9/21 Negative intracranial MRA. No large vessel occlusion or hemodynamically significant stenosis. No aneurysm  Microbiology:see note Blood Culture    Component Value Date/Time   SDES  06/10/2018 1803    BLOOD RIGHT FOREARM Performed at [facility], [address], [place], [place] [address]    SPECREQUEST  06/10/2018 1803    BOTTLES DRAWN AEROBIC ONLY Blood Culture  results may not be optimal due to an inadequate volume of blood received in culture bottles Performed at [facility], [address] [place], [place] [address]    CULT  06/10/2018 1803    NO GROWTH 5 DAYS Performed at [facility], [address] [place], [place] [address]    REPTSTATUS 06/16/2018 FINAL 06/10/2018 1803    Other culture-see note  Medications: Scheduled Meds: . apixaban  5 mg Oral BID  . atorvastatin  40 mg  Oral QHS  . digoxin  0.125 mg Oral Daily  . folic acid  1 mg Oral Daily  . furosemide  40 mg Oral Daily  . insulin aspart  0-5 Units Subcutaneous QHS  . insulin aspart  0-9 Units Subcutaneous TID WC  . losartan  25 mg Oral Daily  . multivitamin with minerals  1 tablet Oral Daily  . sodium chloride flush  3 mL Intravenous Q12H  . spironolactone  25 mg Oral Daily  . thiamine  100 mg Oral Daily   Continuous Infusions: . sodium chloride 10 mL/hr at 10/17/19 2317   Antimicrobials: Anti-infectives (From admission, onward)   None     Objective: Vitals: Today's Vitals   10/23/19 2335 10/24/19 0315 10/24/19 0400 10/24/19 0500  BP: 117/78 107/80    Pulse: (!) 56 63    Resp: 19 18 18    Temp: (!) 97.5 F (36.4 C) 97.6 F (36.4 C)    TempSrc: Oral Oral    SpO2: 99% 99%    Weight:    74.4 kg  Height:      PainSc:        Intake/Output Summary (Last 24 hours) at 10/24/2019 0737 Last data filed at 10/23/2019 1834 Gross per 24 hour  Intake 836 ml  Output --  Net 836 ml   Filed Weights   10/21/19 0650 10/23/19 0451 10/24/19 0500  Weight: 71.2 kg 72.2 kg 74.4 kg   Weight change: 2.19 kg  Intake/Output from previous day: 09/28 0701 - 09/29 0700 In: 836 [P.O.:836] Out: -  Intake/Output this shift: No intake/output data recorded.  Examination: General exam: AA, follows command, trying to speakic, dysarthria, NAD, weak appearing. HEENT:Oral mucosa moist, Ear/Nose WNL grossly, dentition normal. Respiratory system: bilaterally clear on exam,no wheezing or crackles,no use of accessory muscle Cardiovascular system: S1 & S2 +, No JVD,. Gastrointestinal system: Abdomen soft, NT,ND, BS+ Nervous System:Alert, awake, moving extremities and grossly nonfocal Extremities: No edema, distal peripheral pulses palpable.  Skin: No rashes,no icterus. MSK: Normal muscle bulk,tone, power  Data Reviewed: I have personally reviewed following labs and imaging studies CBC: Recent Labs  Lab  10/18/19 1305 10/19/19 0242  WBC 3.2* 3.5*  HGB 15.0 15.5  HCT 47.0 48.6  MCV 85.3 84.8  PLT 255 253   Basic Metabolic Panel: Recent Labs  Lab 10/18/19 1305 10/19/19 0242 10/20/19 0350 10/21/19 0443 10/22/19 0259  NA 134* 134* 133* 132* 132*  K 4.6 4.4 4.6 4.6 4.8  CL 102 100 101 100 99  CO2 22 23 23 24 23   GLUCOSE 171* 101* 114* 88 95  BUN 22* 23* 28* 27* 32*  CREATININE 1.41* 1.28* 1.34* 1.25* 1.28*  CALCIUM 9.4 9.4 9.6 9.7 9.7   GFR: Estimated Creatinine Clearance: 70.2 mL/min (A) (by C-G formula based on SCr of 1.28 mg/dL (H)). Liver Function Tests: Recent Labs  Lab 10/18/19 1305 10/19/19 0242  AST 34 32  ALT 47* 47*  ALKPHOS 111 116  BILITOT 0.8 0.8  PROT 7.0 7.4  ALBUMIN 3.3* 3.6   No results for input(s): LIPASE, AMYLASE in the last 168 hours. No results for input(s): AMMONIA in the last 168 hours. Coagulation Profile: No results for input(s): INR, PROTIME in the last 168 hours. Cardiac Enzymes: No results for input(s): CKTOTAL, CKMB, CKMBINDEX, TROPONINI in the last 168 hours. BNP (last 3 results) No results for input(s): PROBNP in the last 8760 hours. HbA1C: No results for input(s): HGBA1C in the last 72 hours. CBG: Recent Labs  Lab 10/23/19 0612 10/23/19 1126 10/23/19 1608 10/23/19 2131 10/24/19 0630  GLUCAP 113* 146* 92 117* 109*   Lipid Profile: No results for input(s): CHOL, HDL, LDLCALC, TRIG, CHOLHDL, LDLDIRECT in the last 72 hours. Thyroid Function Tests: No results for input(s): TSH, T4TOTAL, FREET4, T3FREE, THYROIDAB in the last 72 hours. Anemia Panel: No results for input(s): VITAMINB12, FOLATE, FERRITIN, TIBC, IRON, RETICCTPCT in the last 72 hours. Sepsis Labs: No results for input(s): PROCALCITON, LATICACIDVEN in the last 168 hours.  No results found for this or any previous visit (from the past 240 hour(s)).   Radiology Studies: No results found.   LOS: 12 days   Lanae Boast, MD [facility]  10/24/2019, 7:37 AM

## 2019-10-24 NOTE — Progress Notes (Signed)
Physical Therapy Treatment Patient Details Name: Kerry Hamilton MRN: [id number] DOB: 23-Jun-1965 Today's Date: 10/24/2019    History of Present Illness Pt is a 54 y.o. male admitted 10/11/19 with SOB and chest pain; chart states pt with ETOH and cocaine use for the past week. Workup for acute on chronic CHF secondary to nonischemic cardiomyopathy, suspect ETOH abuse. Worsening confusion noted 9/20-9/21; MRI with acute, moderate-sized infarct in R frontoparietal region. PMH includes substance abuse, DM2, CAD, depression.    PT Comments    Patient continues with very impaired ability to initiate tasks/mobility. He can verbally repeat the task you have asked him to complete, however does not initiate. Once manually facilitated/cued, he will then engage in task. Mildly decreased balance with turning (both left and right) however ?partially due to not understanding which way he was to turn and required facilitation to turn the right direction.    Follow Up Recommendations  SNF;Supervision/Assistance - 24 hour (vs long-term care)     Equipment Recommendations  Other (comment) (TBD)    Recommendations for Other Services       Precautions / Restrictions Precautions Precautions: Fall    Mobility  Bed Mobility           Sit to supine: Min assist   General bed mobility comments: seated EOB with bed alarming on arrival. Returned to bed at end of session with min facilitation to initiate movement  Transfers Overall transfer level: Needs assistance Equipment used: 1 person hand held assist Transfers: Sit to/from Stand Sit to Stand: Min assist         General transfer comment: min assist x 3 reps; guiding assist as verbally cued and pt would engage  Ambulation/Gait Ambulation/Gait assistance: Min assist Gait Distance (Feet): 200 Feet Assistive device: 1 person hand held assist;None Gait Pattern/deviations: Step-through pattern;Decreased stride length Gait velocity: Decreased    General Gait Details: guiding assist/facilitation along with verbal instructions; could not figure out left vs right when given cues to turn to his right; required continued facilitation to continue to walk   Stairs             Wheelchair Mobility    Modified Rankin (Stroke Patients Only) Modified Rankin (Stroke Patients Only) Pre-Morbid Rankin Score: No symptoms Modified Rankin: Moderately severe disability     Balance Overall balance assessment: Needs assistance Sitting-balance support: No upper extremity supported;Feet supported Sitting balance-Leahy Scale: Good Sitting balance - Comments: max cues and facilitation able to reach to floor to pick up object   Standing balance support: No upper extremity supported Standing balance-Leahy Scale: Fair Standing balance comment: attempted to get pt to engage in dynamic balance activities with reaching for objects, however pt would not engage despite various attempts                            Cognition Arousal/Alertness: Awake/alert Behavior During Therapy: Flat affect Overall Cognitive Status: No family/caregiver present to determine baseline cognitive functioning                                 General Comments: putting more words together, but continues with fragments of sentences, word-finding issues, and perseverating. Does not initiate movements even when he can verbally repeat the requested action. Requires manual facilitation to initiate and then he joins in effort.       Exercises      General Comments General  comments (skin integrity, edema, etc.): Incr time for all mobility to allow time to process requests, however ultimately needs manual/tactile cues to initiate all mobility      Pertinent Vitals/Pain Pain Assessment: Faces Faces Pain Scale: No hurt    Home Living                      Prior Function            PT Goals (current goals can now be found in the care  plan section) Acute Rehab PT Goals Patient Stated Goal: not stated Time For Goal Achievement: 10/31/19 Potential to Achieve Goals: Fair Progress towards PT goals: Progressing toward goals    Frequency    Min 2X/week      PT Plan Current plan remains appropriate    Co-evaluation              AM-PAC PT "6 Clicks" Mobility   Outcome Measure  Help needed turning from your back to your side while in a flat bed without using bedrails?: A Little Help needed moving from lying on your back to sitting on the side of a flat bed without using bedrails?: A Lot Help needed moving to and from a bed to a chair (including a wheelchair)?: A Little Help needed standing up from a chair using your arms (e.g., wheelchair or bedside chair)?: A Little Help needed to walk in hospital room?: A Little Help needed climbing 3-5 steps with a railing? : A Lot 6 Click Score: 16    End of Session Equipment Utilized During Treatment: Gait belt Activity Tolerance: Patient tolerated treatment well Patient left: with call bell/phone within reach;in bed;with bed alarm set   PT Visit Diagnosis: Other abnormalities of gait and mobility (R26.89);Muscle weakness (generalized) (M62.81);Other symptoms and signs involving the nervous system (R29.898)     Time: 5784-6962 PT Time Calculation (min) (ACUTE ONLY): 27 min  Charges:  $Gait Training: 8-22 mins $Therapeutic Activity: 8-22 mins                      [name], PT Pager [phone]    Zena Amos 10/24/2019, 12:35 PM

## 2019-10-25 ENCOUNTER — Inpatient Hospital Stay (HOSPITAL_COMMUNITY): Payer: Medicaid Other

## 2019-10-25 DIAGNOSIS — I5023 Acute on chronic systolic (congestive) heart failure: Secondary | ICD-10-CM | POA: Diagnosis not present

## 2019-10-25 LAB — BASIC METABOLIC PANEL
Anion gap: 13 (ref 5–15)
BUN: 30 mg/dL — ABNORMAL HIGH (ref 6–20)
CO2: 23 mmol/L (ref 22–32)
Calcium: 9.9 mg/dL (ref 8.9–10.3)
Chloride: 96 mmol/L — ABNORMAL LOW (ref 98–111)
Creatinine, Ser: 1.35 mg/dL — ABNORMAL HIGH (ref 0.61–1.24)
GFR calc Af Amer: >60 mL/min (ref >60)
GFR calc non Af Amer: 60 mL/min — ABNORMAL LOW (ref >60)
Glucose, Bld: 230 mg/dL — ABNORMAL HIGH (ref 70–99)
Potassium: 4.5 mmol/L (ref 3.5–5.1)
Sodium: 132 mmol/L — ABNORMAL LOW (ref 135–145)

## 2019-10-25 LAB — CBC
HCT: 51.9 % (ref 39.0–52.0)
Hemoglobin: 17 g/dL (ref 13.0–17.0)
MCH: 27.6 pg (ref 26.0–34.0)
MCHC: 32.8 g/dL (ref 30.0–36.0)
MCV: 84.1 fL (ref 80.0–100.0)
Platelets: 212 10*3/uL (ref 150–400)
RBC: 6.17 MIL/uL — ABNORMAL HIGH (ref 4.22–5.81)
RDW: 12.3 % (ref 11.5–15.5)
WBC: 3.4 10*3/uL — ABNORMAL LOW (ref 4.0–10.5)
nRBC: 0 % (ref 0.0–0.2)

## 2019-10-25 LAB — GLUCOSE, CAPILLARY
Glucose-Capillary: 100 mg/dL — ABNORMAL HIGH (ref 70–99)
Glucose-Capillary: 198 mg/dL — ABNORMAL HIGH (ref 70–99)
Glucose-Capillary: 132 mg/dL — ABNORMAL HIGH (ref 70–99)
Glucose-Capillary: 101 mg/dL — ABNORMAL HIGH (ref 70–99)

## 2019-10-25 MED ORDER — HYDROCODONE-ACETAMINOPHEN 5-325 MG PO TABS
1.0000 | ORAL_TABLET | Freq: Four times a day (QID) | ORAL | Status: DC | PRN
  Administered 2019-10-25 – 2019-10-31 (×5): 1 via ORAL
  Filled 2019-10-25 (×5): qty 1

## 2019-10-25 NOTE — Progress Notes (Signed)
PROGRESS NOTE    Kerry Hamilton  QBH:[id number] DOB: Jun 01, 1965 DOA: 10/11/2019 PCP: Medicine, [facility]   Chief Complaint  Patient presents with  . Chest Pain    Brief  Per chart: 54 year old man with EF 15%, DM 2 was admitted yesterday with shortness of breath and chest pain. He had been drinking a lot and using crack for the last week PTA. Laboratory data was noted for decompensated heart failure and troponin around 25.Patient was treated with Lasix IV and seen by the advanced heart failure team.  On day 3 after admission patient was noted to be talking to himself and somewhat confused.  Patient had been on CIWA protocol and is thought to be in DTs. Ativan has been effective in controlling his agitation.  9/21:Continues to be confused, not following instruction moving upper extremities, CT head obtained that showed acute stroke and neurology was consulted and transferred to Neuro floor 3W. Neurology following, given his alcohol abuse letter cardiology question wet Mahala Menghini s/p high-dose IV thiamine.  Patient completed stroke work-up. 9/28: started on eliquis since 7-10 days post stroke as per Dr Pearlean Brownie given his low LVEF and advised to continue on treatment EF is> 30-35% Patient remains dysarthric, at times impulsive, on low-dose Ativan orally but overall improving following more commands and continues to require extensive rehabilitation moving forward  Subjective: Working with physical therapy, he appeared to indicate that he was having headache ? feeling sick. CT head ordered he was recently started on Eliquis for anticoagulation. He did ambulate to the bathroom with help. No other acute events overnight.  Routine labs fairly stable  Assessment & Plan:  Acute on chronic systolic CHF 2/2 Nonischemic cardiomyopathy, ?Wet Beri beri from [place] abuse/thiamine deficiency-echo 5/20 EF 15% right/left HC 5/20-no CAD. Seen by cardiology no further work-up and not a candidate for  advanced therapies given his poor compliance and substance abuse. his lvef is 15-20% currently.continue with current medical therapy with Lasix 40 mg, Lanoxin, losartan and Aldactone.Not on beta-blocker due to low EF.  Monitoring labs intermittently.   Acute metabolic encephalopathy/ possible DTs, werneckie's? given his alcohol abuse- S/P high dose thiamine as per neuro. MRI w/ acute stroke. Sister reports a history of schizophrenia and not on medication. Seen by psychiatry and they are going to follow-up closely for full assessment. Continue supportive measures, fall precaution, reorientation.  He seems to be more alert awake following some instruction but still dysarthric.  Having some headache today.  Continue p.o. Ativan as needed.  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 MRA no LVO or stenosis. MRI limited- mod sized acute ischemic infarct rt frontoparietal region, post Rt MCA. Carotid duplex no acute finding. Cont on Aspirin 81, Lipitor. Neurology following and has recommended-anticoagulation 7 to 10 days post stroke ( 9-27 to 9-30) given his severe cardiomyopathy with acute stroke and anticoagulation to be discontinued once LVEF >30-35%.Once he is off anticoagulation neuro recommend 30-day cardiac event monitoring or loop recorder to rule out A. Fib: Discussed with Dr. Pearlean Brownie 9/28 and started on  Eliquis 5 mg twice daily. ?headache today repeat CT head.  Polysubstance abuse:with cocaine, etoh.confirmed by patient's history and that he uses drugs and drinks alcohol.  Will need resources for outpatient rehabilitation upon discharge.  History of schizophrenia not taking medication as per sister. Psychiatric input appreciatedthey are unable to do full assessment and will keep the patient in the consult  list-and has advised to reconsult once he is medically cleared.  Mild AKI : Creatinine holding  stable 1-1.4.  Monitor intermittently.  Recent Labs  Lab 10/19/19 0242 10/20/19 0350 10/21/19 0443 10/22/19 0259 10/25/19 0845  BUN 23* 28* 27* 32* 30*  CREATININE 1.28* 1.34* 1.25* 1.28* 1.35*   T2DM, noninsulin dependent. HBA1C 6.3.  Blood sugar is fairly controlled on sliding scale insulin.  Monitor CBG. Recent Labs  Lab 10/24/19 1119 10/24/19 1626 10/24/19 2109 10/25/19 0607 10/25/19 1107  GLUCAP 234* 147* 114* 100* 198*    Elevated troponin/chest pain,troponin mildly elevated: Seen by cardiology no further ischemic work-up.  Apparently had a stable cardiac cath in 2020  Mild transaminitis abdomen ultrasound unrevealing,LFTs have improved.  Mild Hyponatremia monitor closely encourage oral intake.  DVT prophylaxis:ELIQUIS Code Status:   Code Status: Full Code   Family Communication: plan of care discussed with patient's sisterpreviously.  Status QQ:[id number] Remains inpatient appropriate because:Acute stroke, confusion, acute systolic CHF Dispo:The patient is from: Home-was living in a boarding home            Anticipated d/c is to: Cir evaluated-not a candidate for CIR as has no safe disposition post CIR and has recommended SNF first then ALF.Sister desires the same.              Anticipated d/c date is: Once bed available.              Patient currently is not medically stable to d/c. Nutrition: Diet Order            Diet Carb Modified Fluid consistency: Thin; Room service appropriate? No  Diet effective now                 Body mass index is 22.24 kg/m. Consultants:see note  Procedures: TTE 9/17 1. Left ventricular ejection fraction, by estimation, is 15-20%. The left  ventricle has severely decreased function. The left ventricle demonstrates  global hypokinesis. The left ventricular internal cavity size was severely  dilated. Left ventricular  diastolic parameters are consistent with Grade III diastolic dysfunction  (restrictive). Elevated left atrial  pressure.  2. Right ventricular systolic function is severely reduced. The right  ventricular size is severely enlarged. There is moderately elevated  pulmonary artery systolic pressure. The estimated right ventricular  systolic pressure is 54.8 mmHg.  3. Left atrial size was severely dilated.  4. Right atrial size was moderately dilated.  5. Severe mitral regurgitation. Restricted PMVL in systole due to dilated  cardiomyopathy (IIIb). The mitral valve is abnormal. Moderate to severe  mitral valve regurgitation.  6. Tricuspid valve regurgitation is moderate to severe.  7. The aortic valve is tricuspid. Aortic valve regurgitation is not  visualized. No aortic stenosis is present.  8. The inferior vena cava is normal in size with <50% respiratory  variability, suggesting right atrial pressure of 8 mmHg  MRI HEAD IMPRESSION: 9/21 1.Limited exam due to the patient's inability to tolerate the full length of the study. Diffusion-weighted imaging only was performed. 2. Moderate-sized acute ischemic infarct involving the right frontoparietal region, posterior right MCA distribution. No associated mass effect. 3. No other definite acute intracranial abnormality.  MRA HEAD IMPRESSION: 9/21 Negative intracranial MRA. No large vessel occlusion or hemodynamically significant stenosis. No aneurysm  Microbiology:see note Blood Culture    Component Value Date/Time   SDES  06/10/2018 1803    BLOOD RIGHT FOREARM Performed at [facility], [address], [place], [place] [address]  SPECREQUEST  06/10/2018 1803    BOTTLES DRAWN AEROBIC ONLY Blood Culture results may not be optimal due to an inadequate volume of blood received in culture bottles Performed at [facility], [address] [place], [place] [address]    CULT  06/10/2018 1803    NO GROWTH 5 DAYS Performed at [facility], [address] [place], [place] [address]    REPTSTATUS 06/16/2018  FINAL 06/10/2018 1803    Other culture-see note  Medications: Scheduled Meds: . apixaban  5 mg Oral BID  . atorvastatin  40 mg Oral QHS  . digoxin  0.125 mg Oral Daily  . folic acid  1 mg Oral Daily  . furosemide  40 mg Oral Daily  . insulin aspart  0-5 Units Subcutaneous QHS  . insulin aspart  0-9 Units Subcutaneous TID WC  . losartan  25 mg Oral Daily  . multivitamin with minerals  1 tablet Oral Daily  . sodium chloride flush  3 mL Intravenous Q12H  . spironolactone  25 mg Oral Daily  . thiamine  100 mg Oral Daily   Continuous Infusions: . sodium chloride 10 mL/hr at 10/17/19 2317   Antimicrobials: Anti-infectives (From admission, onward)   None     Objective: Vitals: Today's Vitals   10/24/19 2310 10/25/19 0404 10/25/19 0822 10/25/19 1111  BP: 106/73 103/72 (!) 112/91 117/84  Pulse: (!) 59 66 82 73  Resp: 16 16 20 18   Temp: 97.8 F (36.6 C) 97.6 F (36.4 C) 97.6 F (36.4 C) 97.8 F (36.6 C)  TempSrc: Oral Oral Axillary Oral  SpO2: 99% 100% 100% 100%  Weight:      Height:      PainSc:        Intake/Output Summary (Last 24 hours) at 10/25/2019 1259 Last data filed at 10/25/2019 0800 Gross per 24 hour  Intake 1060 ml  Output --  Net 1060 ml   Filed Weights   10/21/19 0650 10/23/19 0451 10/24/19 0500  Weight: 71.2 kg 72.2 kg 74.4 kg   Weight change:   Intake/Output from previous day: 09/29 0701 - 09/30 0700 In: 1060 [P.O.:1060] Out: -  Intake/Output this shift: Total I/O In: 320 [P.O.:320] Out: -   Examination: General exam: AA, follows some commands, dysarthric, but able to speak short few sentences.  HEENT:Oral mucosa moist, Ear/Nose WNL grossly, dentition normal. Respiratory system: bilaterally clear,no wheezing or crackles,no use of accessory muscle Cardiovascular system: S1 & S2 +, No JVD,. Gastrointestinal system: Abdomen soft, NT,ND, BS+ Nervous System:Alert, awake, moving extremities and grossly nonfocal Extremities: No edema, distal  peripheral pulses palpable.  Skin: No rashes,no icterus. MSK: Normal muscle bulk,tone, power.  Data Reviewed: I have personally reviewed following labs and imaging studies CBC: Recent Labs  Lab 10/18/19 1305 10/19/19 0242 10/25/19 0845  WBC 3.2* 3.5* 3.4*  HGB 15.0 15.5 17.0  HCT 47.0 48.6 51.9  MCV 85.3 84.8 84.1  PLT 255 253 212   Basic Metabolic Panel: Recent Labs  Lab 10/19/19 0242 10/20/19 0350 10/21/19 0443 10/22/19 0259 10/25/19 0845  NA 134* 133* 132* 132* 132*  K 4.4 4.6 4.6 4.8 4.5  CL 100 101 100 99 96*  CO2 23 23 24 23 23   GLUCOSE 101* 114* 88 95 230*  BUN 23* 28* 27* 32* 30*  CREATININE 1.28* 1.34* 1.25* 1.28* 1.35*  CALCIUM 9.4 9.6 9.7 9.7 9.9   GFR: Estimated Creatinine Clearance: 66.6 mL/min (A) (by C-G formula based on SCr of 1.35 mg/dL (  H)). Liver Function Tests: Recent Labs  Lab 10/18/19 1305 10/19/19 0242  AST 34 32  ALT 47* 47*  ALKPHOS 111 116  BILITOT 0.8 0.8  PROT 7.0 7.4  ALBUMIN 3.3* 3.6   No results for input(s): LIPASE, AMYLASE in the last 168 hours. No results for input(s): AMMONIA in the last 168 hours. Coagulation Profile: No results for input(s): INR, PROTIME in the last 168 hours. Cardiac Enzymes: No results for input(s): CKTOTAL, CKMB, CKMBINDEX, TROPONINI in the last 168 hours. BNP (last 3 results) No results for input(s): PROBNP in the last 8760 hours. HbA1C: No results for input(s): HGBA1C in the last 72 hours. CBG: Recent Labs  Lab 10/24/19 1119 10/24/19 1626 10/24/19 2109 10/25/19 0607 10/25/19 1107  GLUCAP 234* 147* 114* 100* 198*   Lipid Profile: No results for input(s): CHOL, HDL, LDLCALC, TRIG, CHOLHDL, LDLDIRECT in the last 72 hours. Thyroid Function Tests: No results for input(s): TSH, T4TOTAL, FREET4, T3FREE, THYROIDAB in the last 72 hours. Anemia Panel: No results for input(s): VITAMINB12, FOLATE, FERRITIN, TIBC, IRON, RETICCTPCT in the last 72 hours. Sepsis Labs: No results for input(s):  PROCALCITON, LATICACIDVEN in the last 168 hours.  No results found for this or any previous visit (from the past 240 hour(s)).   Radiology Studies: No results found.   LOS: 13 days   Lanae Boast, MD [facility]  10/25/2019, 12:59 PM

## 2019-10-25 NOTE — Progress Notes (Signed)
Occupational Therapy Treatment Patient Details Name: Kerry Hamilton MRN: [id number] DOB: 1966/01/18 Today's Date: 10/25/2019    History of present illness Pt is a 54 y.o. male admitted 10/11/19 with SOB and chest pain; chart states pt with ETOH and cocaine use for the past week. Workup for acute on chronic CHF secondary to nonischemic cardiomyopathy, suspect ETOH abuse. Worsening confusion noted 9/20-9/21; MRI with acute, moderate-sized infarct in R frontoparietal region. PMH includes substance abuse, DM2, CAD, depression.   OT comments  Patient continues to make steady progress towards goals in skilled OT session. Upon arrival to patients room, pt immediately sat upright (bed alarm sounding) exclaiming "super sick super sick super super sick". Pt holding his head while repeating aforementioned statement, and after a number of moments was able to nod that he had a headache. Pt attempting to stand prematurely to walk to the bathroom, but needed guiding in order to visually locate and enter the bathroom. Pt requiring multimodal cues to initiate hand washing, (guiding hips to face sink, and hands into sink) but then could complete the task without cues. Pt ambulated back to bed with RN providing medication for head at end of session. Will continue to follow acutely.    Follow Up Recommendations  CIR    Equipment Recommendations  None recommended by OT    Recommendations for Other Services      Precautions / Restrictions Precautions Precautions: Fall Restrictions Weight Bearing Restrictions: No       Mobility Bed Mobility               General bed mobility comments: seated EOB with bed alarming on arrival. Returned to bed at end of session with min facilitation to initiate movement  Transfers Overall transfer level: Needs assistance Equipment used: 1 person hand held assist Transfers: Sit to/from Stand Sit to Stand: Min assist         General transfer comment: min assist x 2  reps; guiding assist as verbally cued and pt would engage    Balance Overall balance assessment: Needs assistance Sitting-balance support: No upper extremity supported;Feet supported Sitting balance-Leahy Scale: Good     Standing balance support: No upper extremity supported Standing balance-Leahy Scale: Fair Standing balance comment: Needed increased guiding at hips to successfully get to bathroom (still unable to look further at vision but central vision seems affected)                           ADL either performed or assessed with clinical judgement   ADL Overall ADL's : Needs assistance/impaired     Grooming: Wash/dry hands;Moderate assistance;Standing Grooming Details (indicate cue type and reason): Cues to engage in task, once engaged could complete                 Toilet Transfer: Minimal Holiday representative;Ambulation;Cueing for safety           Functional mobility during ADLs: Minimal assistance General ADL Comments: Pt wanting to ambulate to bathroom, required minimal guiding and one person HHA to steady     Vision       Perception     Praxis      Cognition Arousal/Alertness: Awake/alert Behavior During Therapy: Restless Overall Cognitive Status: No family/caregiver present to determine baseline cognitive functioning                                 General  Comments: Perseverating on "super sick" sitting EOB, with extended time and cues was able to state headache, but much less responsive in this session in comparision        Exercises     Shoulder Instructions       General Comments      Pertinent Vitals/ Pain       Pain Assessment: Faces Faces Pain Scale: Hurts worst Pain Location: Sitting up at edge of exclaiming "pain pain pain" after extended period of time was able state was in his head Pain Descriptors / Indicators: Discomfort Pain Intervention(s): Limited activity within patient's tolerance;Monitored  during session;Patient requesting pain meds-RN notified  Home Living                                          Prior Functioning/Environment              Frequency  Min 2X/week        Progress Toward Goals  OT Goals(current goals can now be found in the care plan section)  Progress towards OT goals: Progressing toward goals  Acute Rehab OT Goals Patient Stated Goal: not stated OT Goal Formulation: Patient unable to participate in goal setting Time For Goal Achievement: 10/31/19 Potential to Achieve Goals: Good  Plan Discharge plan remains appropriate    Co-evaluation                 AM-PAC OT "6 Clicks" Daily Activity     Outcome Measure   Help from another person eating meals?: A Little Help from another person taking care of personal grooming?: A Little Help from another person toileting, which includes using toliet, bedpan, or urinal?: A Little Help from another person bathing (including washing, rinsing, drying)?: A Lot Help from another person to put on and taking off regular upper body clothing?: A Little Help from another person to put on and taking off regular lower body clothing?: A Lot 6 Click Score: 16    End of Session    OT Visit Diagnosis: Unsteadiness on feet (R26.81);Muscle weakness (generalized) (M62.81)   Activity Tolerance Patient tolerated treatment well   Patient Left in bed;with call bell/phone within reach;with bed alarm set;with nursing/sitter in room   Nurse Communication Mobility status        Time: 1572-6203 OT Time Calculation (min): 14 min  Charges: OT General Charges $OT Visit: 1 Visit OT Treatments $Self Care/Home Management : 8-22 mins  Pollyann Glen E. Sabriah Hobbins, COTA/L Acute Rehabilitation Services [phone]   Cherlyn Cushing 10/25/2019, 1:25 PM

## 2019-10-26 DIAGNOSIS — I5023 Acute on chronic systolic (congestive) heart failure: Secondary | ICD-10-CM | POA: Diagnosis not present

## 2019-10-26 LAB — GLUCOSE, CAPILLARY
Glucose-Capillary: 93 mg/dL (ref 70–99)
Glucose-Capillary: 184 mg/dL — ABNORMAL HIGH (ref 70–99)
Glucose-Capillary: 93 mg/dL (ref 70–99)
Glucose-Capillary: 97 mg/dL (ref 70–99)

## 2019-10-26 NOTE — Progress Notes (Signed)
PROGRESS NOTE    Kerry Hamilton  SVX:[id number] DOB: 10-Nov-1965 DOA: 10/11/2019 PCP: Medicine, [facility]   Chief Complaint  Patient presents with   Chest Pain    Brief  Per chart: 54 year old man with EF 15%, DM 2 was admitted yesterday with shortness of breath and chest pain. He had been drinking a lot and using crack for the last week PTA. Laboratory data was noted for decompensated heart failure and troponin around 25.Patient was treated with Lasix IV and seen by the advanced heart failure team.  On day 3 after admission patient was noted to be talking to himself and somewhat confused.  Patient had been on CIWA protocol and is thought to be in DTs. Ativan has been effective in controlling his agitation.  9/21:Continues to be confused, not following instruction moving upper extremities, CT head obtained that showed acute stroke and neurology was consulted and transferred to Neuro floor 3W. Neurology following, given his alcohol abuse letter cardiology question wet Mahala Menghini s/p high-dose IV thiamine.  Patient completed stroke work-up. 9/28: started on eliquis since 7-10 days post stroke as per Dr Pearlean Brownie given his low LVEF and advised to continue on treatment EF is> 30-35% Patient remains dysarthric, at times impulsive, on low-dose Ativan orally but overall improving following more commands and continues to require extensive rehabilitation moving forward 9/30: repeat ct head for possible headache: No acute bleeding demonstrated acute stroke  Subjective:  No acute events overnight. Patient complained of being "sick" 9/30- and nursing felt that he was having headache and repeat CT head was obtained given that he was initiated on anticoagulation few days ago and there is no bleeding. He is afebrile.  Blood pressure stable. This morning is alert awake, does not answer any questions but able to move his extremities on my verbal command, reports "I am weak"  Assessment &  Plan:  Acute on chronic systolic CHF 2/2 Nonischemic cardiomyopathy, ?Wet Beri beri from [place] abuse/thiamine deficiency-echo 5/20 EF 15% right/left HC 5/20-no CAD. Seen by cardiology no further work-up and not a candidate for advanced therapies given his poor compliance and substance abuse. his lvef is 15-20% currently.continue with current medical therapy with Lasix 40 mg, Lanoxin, losartan and Aldactone.not on beta-blocker due to low EF.  Monitor labs intermittently will order for a.m.     Acute metabolic encephalopathy/ possible DTs, werneckie's? given his alcohol abuse- S/P high dose thiamine as per neuro. MRI w/ acute stroke. Sister reports a history of schizophrenia and not on medication. Seen by psychiatry and they are going to follow-up closely for full assessment. Continue supportive measures, fall precaution, reorientation.  He seems to be alert awake able to speak some sentences, at times keep repeating "I have a stroke", I cannot think", still dysarthric.  Encourage PT OT.  Continue p.o. Ativan as needed.   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 MRA no LVO or stenosis. MRI limited- mod sized acute ischemic infarct rt frontoparietal region, post Rt MCA. Carotid duplex no acute finding. Cont on Aspirin 81, Lipitor. Neurology following and has recommended-anticoagulation 7 to 10 days post stroke ( 9-27 to 9-30) given his severe cardiomyopathy with acute stroke and anticoagulation to be discontinued once LVEF >30-35%.Once he is off anticoagulation neuro recommend 30-day cardiac event monitoring or loop recorder to rule out A. Fib: Discussed with Dr. Pearlean Brownie 9/28 and started on  Eliquis 5 mg twice daily.  Repeat CT head 9/30 no parenchymal bleeding, no significant mass-effect, double cortical hyperdensity in the region of acute stroke which may reflect cortical laminar necrosis and/or minimal  petechial hemorrhage " MONITOR closely  Polysubstance abuse:with cocaine, etoh.confirmed by patient's sister and that he uses drugs and drinks alcohol.  Will need resources for outpatient rehabilitation upon discharge.  History of schizophrenia not taking medication as per sister. Psychiatric input appreciatedthey are unable to do full assessment and will keep the patient in the consult list-and has advised to reconsult once he is medically cleared.  Mild AKI : Creatinine holding stable 1-1.4.  Repeat labs in a.m. Recent Labs  Lab 10/20/19 0350 10/21/19 0443 10/22/19 0259 10/25/19 0845  BUN 28* 27* 32* 30*  CREATININE 1.34* 1.25* 1.28* 1.35*   T2DM, noninsulin dependent. HBA1C 6.3.  Sugar is well controlled on sliding scale insulin.  Recent Labs  Lab 10/25/19 0607 10/25/19 1107 10/25/19 1643 10/25/19 2132 10/26/19 0602  GLUCAP 100* 198* 132* 101* 93    Elevated troponin/chest pain,troponin mildly elevated: Seen by cardiology no further ischemic work-up.  Apparently had a stable cardiac cath in 2020  Mild transaminitis abdomen ultrasound unrevealing,LFTs have improved.  Mild Hyponatremia monitor closely encourage oral intake.  DVT prophylaxis:ELIQUIS Code Status:   Code Status: Full Code   Family Communication: plan of care discussed with patient's sisterpreviously.  Status JM:EQASTMHDQ Remains inpatient appropriate because:Acute stroke, confusion, acute systolic CHF Dispo:The patient is from: Home-was living in a boarding home            Anticipated d/c is to: Cir evaluated-not a candidate for CIR as has no safe disposition post CIR and has recommended SNF first then ALF.Sister desires the same.              Anticipated d/c date is: Once bed available.              Patient currently is not medically stable to d/c. Nutrition: Diet Order            Diet Carb Modified Fluid consistency: Thin; Room service appropriate? No  Diet effective now                 Body mass  index is 22.24 kg/m. Consultants:see note  Procedures: TTE 9/17 1. Left ventricular ejection fraction, by estimation, is 15-20%. The left  ventricle has severely decreased function. The left ventricle demonstrates  global hypokinesis. The left ventricular internal cavity size was severely  dilated. Left ventricular  diastolic parameters are consistent with Grade III diastolic dysfunction  (restrictive). Elevated left atrial pressure.  2. Right ventricular systolic function is severely reduced. The right  ventricular size is severely enlarged. There is moderately elevated  pulmonary artery systolic pressure. The estimated right ventricular  systolic pressure is 54.8 mmHg.  3. Left atrial size was severely dilated.  4. Right atrial size was moderately dilated.  5. Severe mitral regurgitation. Restricted PMVL in systole due to dilated  cardiomyopathy (IIIb). The mitral valve is abnormal. Moderate to severe  mitral valve regurgitation.  6. Tricuspid valve regurgitation is moderate to severe.  7. The aortic valve is tricuspid. Aortic valve regurgitation is not  visualized. No aortic stenosis is present.  8. The inferior vena cava is normal in size with <50% respiratory  variability, suggesting right atrial pressure of 8 mmHg  MRI HEAD IMPRESSION: 9/21 1.Limited exam due to the patient's inability to tolerate the full length of the study. Diffusion-weighted imaging only was performed. 2. Moderate-sized acute ischemic  infarct involving the right frontoparietal region, posterior right MCA distribution. No associated mass effect. 3. No other definite acute intracranial abnormality.  MRA HEAD IMPRESSION: 9/21 Negative intracranial MRA. No large vessel occlusion or hemodynamically significant stenosis. No aneurysm  Microbiology:see note Blood Culture    Component Value Date/Time   SDES  06/10/2018 1803    BLOOD RIGHT FOREARM Performed at [facility], [address] [place], [place] [address]    SPECREQUEST  06/10/2018 1803    BOTTLES DRAWN AEROBIC ONLY Blood Culture results may not be optimal due to an inadequate volume of blood received in culture bottles Performed at [facility], [address] [place], [place] [address]    CULT  06/10/2018 1803    NO GROWTH 5 DAYS Performed at [facility], [address], [place], [place] [address]    REPTSTATUS 06/16/2018 FINAL 06/10/2018 1803    Other culture-see note  Medications: Scheduled Meds:  apixaban  5 mg Oral BID   atorvastatin  40 mg Oral QHS   digoxin  0.125 mg Oral Daily   folic acid  1 mg Oral Daily   furosemide  40 mg Oral Daily   insulin aspart  0-5 Units Subcutaneous QHS   insulin aspart  0-9 Units Subcutaneous TID WC   losartan  25 mg Oral Daily   multivitamin with minerals  1 tablet Oral Daily   sodium chloride flush  3 mL Intravenous Q12H   spironolactone  25 mg Oral Daily   thiamine  100 mg Oral Daily   Continuous Infusions:  sodium chloride 10 mL/hr at 10/17/19 2317   Antimicrobials: Anti-infectives (From admission, onward)   None     Objective: Vitals: Today's Vitals   10/26/19 0011 10/26/19 0330 10/26/19 0446 10/26/19 0753  BP: 124/75 127/85  124/80  Pulse: 60 63  73  Resp: 16 16  18   Temp: 97.8 F (36.6 C) 97.7 F (36.5 C)  (!) 97.5 F (36.4 C)  TempSrc: Oral Oral  Oral  SpO2: 99% 100%  99%  Weight:      Height:      PainSc:   Asleep     Intake/Output Summary (Last 24 hours) at 10/26/2019 0809 Last data filed at 10/25/2019 1700 Gross per 24 hour  Intake 560 ml  Output --  Net 560 ml   Filed Weights   10/21/19 0650 10/23/19 0451 10/24/19 0500  Weight: 71.2 kg 72.2 kg 74.4 kg   Weight change:   Intake/Output from previous day: 09/30 0701 - 10/01 0700 In: 880 [P.O.:880] Out: -  Intake/Output this shift: No intake/output data recorded.  Examination: General exam: AA,dysarthric, mildly impulsive NAD, weak  appearing. HEENT:Oral mucosa moist, Ear/Nose WNL grossly, dentition normal. Respiratory system: bilaterally CLEAR,no wheezing or crackles,no use of accessory muscle Cardiovascular system: S1 & S2 +, No JVD,. Gastrointestinal system: Abdomen soft, NT,ND, BS+ Nervous System:Alert, awake, moving extremities and grossly nonfocal Extremities: No edema, distal peripheral pulses palpable.  Skin: No rashes,no icterus. MSK: Normal muscle bulk,tone, power  Data Reviewed: I have personally reviewed following labs and imaging studies CBC: Recent Labs  Lab 10/25/19 0845  WBC 3.4*  HGB 17.0  HCT 51.9  MCV 84.1  PLT 212   Basic Metabolic Panel: Recent Labs  Lab 10/20/19 0350 10/21/19 0443 10/22/19 0259 10/25/19 0845  NA 133* 132* 132* 132*  K 4.6 4.6 4.8 4.5  CL 101 100 99 96*  CO2 23 24 23 23   GLUCOSE 114*  88 95 230*  BUN 28* 27* 32* 30*  CREATININE 1.34* 1.25* 1.28* 1.35*  CALCIUM 9.6 9.7 9.7 9.9   GFR: Estimated Creatinine Clearance: 66.6 mL/min (A) (by C-G formula based on SCr of 1.35 mg/dL (H)). Liver Function Tests: No results for input(s): AST, ALT, ALKPHOS, BILITOT, PROT, ALBUMIN in the last 168 hours. No results for input(s): LIPASE, AMYLASE in the last 168 hours. No results for input(s): AMMONIA in the last 168 hours. Coagulation Profile: No results for input(s): INR, PROTIME in the last 168 hours. Cardiac Enzymes: No results for input(s): CKTOTAL, CKMB, CKMBINDEX, TROPONINI in the last 168 hours. BNP (last 3 results) No results for input(s): PROBNP in the last 8760 hours. HbA1C: No results for input(s): HGBA1C in the last 72 hours. CBG: Recent Labs  Lab 10/25/19 0607 10/25/19 1107 10/25/19 1643 10/25/19 2132 10/26/19 0602  GLUCAP 100* 198* 132* 101* 93   Lipid Profile: No results for input(s): CHOL, HDL, LDLCALC, TRIG, CHOLHDL, LDLDIRECT in the last 72 hours. Thyroid Function Tests: No results for input(s): TSH, T4TOTAL, FREET4, T3FREE, THYROIDAB in the  last 72 hours. Anemia Panel: No results for input(s): VITAMINB12, FOLATE, FERRITIN, TIBC, IRON, RETICCTPCT in the last 72 hours. Sepsis Labs: No results for input(s): PROCALCITON, LATICACIDVEN in the last 168 hours.  No results found for this or any previous visit (from the past 240 hour(s)).   Radiology Studies: CT HEAD WO CONTRAST  Result Date: 10/25/2019 CLINICAL DATA:  Headache, new or worsening; on Eliquis post stroke. Rule out bleed. Altered mental status. EXAM: CT HEAD WITHOUT CONTRAST TECHNIQUE: Contiguous axial images were obtained from the base of the skull through the vertex without intravenous contrast. COMPARISON:  Brain MRI 10/16/2019. head CT 10/16/2019. FINDINGS: Brain: Stable, mild generalized parenchymal atrophy. A known large subacute infarct affecting the right parietal lobe, temporoparietal junction and portions of the right occipital lobe is less conspicuous than on the head CT of 10/16/2019 (fogging phenomenon). There is subtle cortical hyperdensity in this region which may reflect cortical laminar necrosis and/or minimal petechial hemorrhage. No parenchymal hemorrhage is identified. Stable mild multifocal hypodensity within the cerebral white matter which is nonspecific, but consistent with chronic small vessel ischemic disease. No extra-axial fluid collection. No evidence of intracranial mass. No midline shift. Vascular: No hyperdense vessel.  Atherosclerotic calcifications. Skull: Redemonstrated chronic fracture deformity of the left zygomatic arch and left temporal bone. Sinuses/Orbits: Visualized orbits show no acute finding. Mild ethmoid sinus mucosal thickening. No significant mastoid effusion. IMPRESSION: A known large subacute infarct affecting the right parietal lobe, temporoparietal junction and portions of the right occipital lobe is less conspicuous than on the head CT of 10/16/2019 (fogging phenomenon). There is subtle cortical hyperdensity in this region which may  reflect cortical laminar necrosis and/or minimal petechial hemorrhage. No parenchymal hemorrhage. No significant mass effect. Stable background mild generalized parenchymal atrophy and cerebral white matter chronic small vessel ischemic disease. Electronically Signed   By: Jackey Loge DO   On: 10/25/2019 13:33     LOS: 14 days   Lanae Boast, MD [facility]  10/26/2019, 8:09 AM

## 2019-10-27 DIAGNOSIS — F1990 Other psychoactive substance use, unspecified, uncomplicated: Secondary | ICD-10-CM

## 2019-10-27 DIAGNOSIS — R7989 Other specified abnormal findings of blood chemistry: Secondary | ICD-10-CM

## 2019-10-27 DIAGNOSIS — E119 Type 2 diabetes mellitus without complications: Secondary | ICD-10-CM | POA: Diagnosis not present

## 2019-10-27 DIAGNOSIS — I6340 Cerebral infarction due to embolism of unspecified cerebral artery: Secondary | ICD-10-CM

## 2019-10-27 DIAGNOSIS — I5023 Acute on chronic systolic (congestive) heart failure: Secondary | ICD-10-CM | POA: Diagnosis not present

## 2019-10-27 LAB — GLUCOSE, CAPILLARY
Glucose-Capillary: 141 mg/dL — ABNORMAL HIGH (ref 70–99)
Glucose-Capillary: 141 mg/dL — ABNORMAL HIGH (ref 70–99)
Glucose-Capillary: 103 mg/dL — ABNORMAL HIGH (ref 70–99)
Glucose-Capillary: 141 mg/dL — ABNORMAL HIGH (ref 70–99)

## 2019-10-27 LAB — COMPREHENSIVE METABOLIC PANEL
ALT: 76 U/L — ABNORMAL HIGH (ref 0–44)
AST: 50 U/L — ABNORMAL HIGH (ref 15–41)
Albumin: 4 g/dL (ref 3.5–5.0)
Alkaline Phosphatase: 122 U/L (ref 38–126)
Anion gap: 11 (ref 5–15)
BUN: 30 mg/dL — ABNORMAL HIGH (ref 6–20)
CO2: 23 mmol/L (ref 22–32)
Calcium: 9.5 mg/dL (ref 8.9–10.3)
Chloride: 97 mmol/L — ABNORMAL LOW (ref 98–111)
Creatinine, Ser: 1.27 mg/dL — ABNORMAL HIGH (ref 0.61–1.24)
GFR calc Af Amer: >60 mL/min (ref >60)
GFR calc non Af Amer: >60 mL/min (ref >60)
Glucose, Bld: 100 mg/dL — ABNORMAL HIGH (ref 70–99)
Potassium: 4.4 mmol/L (ref 3.5–5.1)
Sodium: 131 mmol/L — ABNORMAL LOW (ref 135–145)
Total Bilirubin: 0.5 mg/dL (ref 0.3–1.2)
Total Protein: 7.8 g/dL (ref 6.5–8.1)

## 2019-10-27 LAB — CBC
HCT: 50.9 % (ref 39.0–52.0)
Hemoglobin: 16.6 g/dL (ref 13.0–17.0)
MCH: 27.7 pg (ref 26.0–34.0)
MCHC: 32.6 g/dL (ref 30.0–36.0)
MCV: 84.8 fL (ref 80.0–100.0)
Platelets: 199 10*3/uL (ref 150–400)
RBC: 6 MIL/uL — ABNORMAL HIGH (ref 4.22–5.81)
RDW: 12.2 % (ref 11.5–15.5)
WBC: 3.9 10*3/uL — ABNORMAL LOW (ref 4.0–10.5)
nRBC: 0 % (ref 0.0–0.2)

## 2019-10-27 NOTE — Progress Notes (Signed)
Patient ID: Kerry Hamilton, male   DOB: 12-29-1965, 54 y.o.   MRN: [id number]  PROGRESS NOTE    Kerry Hamilton  DXA:[id number] DOB: 21-Feb-1965 DOA: 10/11/2019 PCP: Medicine, [facility]    Brief Narrative:  Per chart: 54 year old man with EF 15%, DM 2 was admitted yesterday with shortness of breath and chest pain. He had been drinking a lot and using crack for the last week PTA.Laboratory data was noted for decompensated heart failure and troponin around 25.Patient was treated with Lasix IV and seen by the advanced heart failure team. On day 3 after admission patient was noted to be talking to himself and somewhat confused. Patient had been on CIWA protocol and is thought to be in DTs. Ativan has been effective in controlling his agitation.  9/21:Continues to be confused, not following instruction moving upper extremities, CT head obtained that showed acute stroke and neurology was consulted and transferred to Neuro floor 3W. Neurology following, given his alcohol abuse letter cardiology question wet Mahala Menghini s/p high-dose IV thiamine.  Patient completed stroke work-up. 9/28: started on eliquis since 7-10 days post stroke as per Dr Pearlean Brownie given his low LVEF and advised to continue on treatment EF is> 30-35% Patient remains dysarthric, at times impulsive, on low-dose Ativan orally but overall improving following more commands and continues to require extensive rehabilitation moving forward 9/30: repeat ct head for possible headache: No acute bleeding demonstrated acute stroke   Assessment & Plan:   Principal Problem:   Acute on chronic systolic CHF (congestive heart failure) (HCC) Active Problems:   Diabetes mellitus type 2, noninsulin dependent (HCC)   Elevated troponin   Polysubstance abuse (HCC)   Cerebral embolism with cerebral infarction  Acute on chronic systolic CHF 2/2 Nonischemic cardiomyopathy, ?Wet Beri beri from [place] abuse/thiamine deficiency-echo 5/20 EF 15% right/left HC  5/20-no CAD. Seen by cardiology no further work-up and not a candidate for advanced therapies given his poor compliance and substance abuse. his lvef is 15-20% currently.continue with current medical therapy with Lasix 40 mg, Lanoxin, losartan and Aldactone.not on beta-blocker due to low EF.  Monitor labs intermittently will order for a.m.     Acute metabolic encephalopathy/ possible DTs, werneckie's? given his alcohol abuse- S/P high dose thiamine as per neuro. MRI w/ acute stroke. Sister reports a history of schizophrenia and not on medication. Seen by psychiatry and they are going to follow-up closely for full assessment. Continue supportive measures, fall precaution, reorientation.  He seems to be alert awake able to speak some sentences, at times keep repeating "I have a stroke", I cannot think", still dysarthric.  Encourage PT OT.  Continue p.o. Ativan as needed.   Acute moderate sized infarct right frontoparietal region posterior right MCN:OBSJ by neurology, underwent work-up - lipdid panel LDL at 102,hba1c 6.3. MRA no LVO or stenosis. MRI limited- mod sized acute ischemic infarct rt frontoparietal region, post Rt MCA. Carotid duplex no acute finding. Cont on Aspirin 81, Lipitor. Neurology following and has recommended-anticoagulation 7 to 10 days post stroke ( 9-27 to 9-30) given his severe cardiomyopathy with acute stroke and anticoagulation to be discontinued once LVEF >30-35%.Once he is off anticoagulation neuro recommend 30-day cardiac event monitoring or loop recorder to rule out A. Fib: Discussed with Dr. Pearlean Brownie 9/28 and started on  Eliquis 5 mg twice daily.  Repeat CT head 9/30 no parenchymal bleeding, no significant mass-effect, double cortical hyperdensity in the region of acute stroke which may reflect cortical laminar necrosis and/or minimal petechial  hemorrhage " MONITOR closely  Polysubstance abuse:with cocaine, etoh.confirmed by patient's sister and that he uses drugs and drinks alcohol.   Will need resources for outpatient rehabilitation upon discharge.  History of schizophrenia not taking medication as per sister. Psychiatric input appreciated they are unable to do full assessment and will keep the patient in the consult list-and has advised to reconsult once he is medically cleared.  Mild AKI : Creatinine holding stable 1-1.4. stable Avoid nephrotoxic agents   T2DM, noninsulin dependent. HBA1C 6.3.  Sugar is well controlled on sliding scale insulin.   Elevated troponin/chest pain,troponin mildly elevated: Seen by cardiology no further ischemic work-up.  Apparently had a stable cardiac cath in 2020  Mild transaminitis abdomen ultrasound unrevealing,LFTs are borderline.  Mild Hyponatremia monitor closely encourage oral intake . DVT prophylaxis: QT:MAUQJFH Code Status: Full code  Family Communication: None at bedside Disposition Plan: SNF followed by assisted living facility  Patient remains in patient due to unsafe discharge plan.  Plans for SNF when available.  Consultants:   Psychiatry  Cardiology  Neurology  Procedures:  Carotid Dopplers  2D echo  Antimicrobials: Anti-infectives (From admission, onward)   None       Subjective: Patient is without complaints today.  Objective: Vitals:   10/27/19 0351 10/27/19 0354 10/27/19 0839 10/27/19 1203  BP: 118/79  111/79 (!) 124/100  Pulse: 65  69 86  Resp: 19  17 17   Temp: 98 F (36.7 C)  98 F (36.7 C) (!) 97.4 F (36.3 C)  TempSrc: Oral  Oral Oral  SpO2: 99%  99% 100%  Weight:  72.7 kg    Height:        Intake/Output Summary (Last 24 hours) at 10/27/2019 1310 Last data filed at 10/27/2019 1209 Gross per 24 hour  Intake 1050 ml  Output --  Net 1050 ml   Filed Weights   10/23/19 0451 10/24/19 0500 10/27/19 0354  Weight: 72.2 kg 74.4 kg 72.7 kg    Examination:  General exam: Appears calm and comfortable, dysarthric Respiratory system: Clear to auscultation. Respiratory effort  normal. Cardiovascular system: S1 & S2 heard, RRR.  Gastrointestinal system: Abdomen is nondistended, soft and nontender.  Central nervous system: Alert and oriented. No focal neurological deficits. Extremities: Symmetric  Skin: No rashes    Data Reviewed: I have personally reviewed following labs and imaging studies  CBC: Recent Labs  Lab 10/25/19 0845 10/27/19 0148  WBC 3.4* 3.9*  HGB 17.0 16.6  HCT 51.9 50.9  MCV 84.1 84.8  PLT 212 199   Basic Metabolic Panel: Recent Labs  Lab 10/21/19 0443 10/22/19 0259 10/25/19 0845 10/27/19 0148  NA 132* 132* 132* 131*  K 4.6 4.8 4.5 4.4  CL 100 99 96* 97*  CO2 24 23 23 23   GLUCOSE 88 95 230* 100*  BUN 27* 32* 30* 30*  CREATININE 1.25* 1.28* 1.35* 1.27*  CALCIUM 9.7 9.7 9.9 9.5   GFR: Estimated Creatinine Clearance: 69.2 mL/min (A) (by C-G formula based on SCr of 1.27 mg/dL (H)). Liver Function Tests: Recent Labs  Lab 10/27/19 0148  AST 50*  ALT 76*  ALKPHOS 122  BILITOT 0.5  PROT 7.8  ALBUMIN 4.0   CBG: Recent Labs  Lab 10/26/19 1130 10/26/19 1602 10/26/19 2126 10/27/19 0615 10/27/19 1205  GLUCAP 184* 93 97 141* 141*     Radiology Studies: No results found.   Scheduled Meds:  apixaban  5 mg Oral BID   atorvastatin  40 mg Oral QHS   digoxin  0.125 mg Oral Daily   folic acid  1 mg Oral Daily   furosemide  40 mg Oral Daily   insulin aspart  0-5 Units Subcutaneous QHS   insulin aspart  0-9 Units Subcutaneous TID WC   losartan  25 mg Oral Daily   multivitamin with minerals  1 tablet Oral Daily   sodium chloride flush  3 mL Intravenous Q12H   spironolactone  25 mg Oral Daily   thiamine  100 mg Oral Daily   Continuous Infusions:  sodium chloride 10 mL/hr at 10/17/19 2317     LOS: 15 days    Reva Bores, MD 10/27/2019 1:10 PM [phone] [facility] If 7PM-7AM, please contact night-coverage 10/27/2019, 1:10 PM

## 2019-10-28 DIAGNOSIS — E119 Type 2 diabetes mellitus without complications: Secondary | ICD-10-CM | POA: Diagnosis not present

## 2019-10-28 DIAGNOSIS — I5023 Acute on chronic systolic (congestive) heart failure: Secondary | ICD-10-CM | POA: Diagnosis not present

## 2019-10-28 DIAGNOSIS — F1990 Other psychoactive substance use, unspecified, uncomplicated: Secondary | ICD-10-CM | POA: Diagnosis not present

## 2019-10-28 DIAGNOSIS — R7989 Other specified abnormal findings of blood chemistry: Secondary | ICD-10-CM | POA: Diagnosis not present

## 2019-10-28 LAB — GLUCOSE, CAPILLARY
Glucose-Capillary: 109 mg/dL — ABNORMAL HIGH (ref 70–99)
Glucose-Capillary: 140 mg/dL — ABNORMAL HIGH (ref 70–99)
Glucose-Capillary: 89 mg/dL (ref 70–99)
Glucose-Capillary: 165 mg/dL — ABNORMAL HIGH (ref 70–99)

## 2019-10-28 MED ORDER — DIPHENHYDRAMINE HCL 25 MG PO CAPS
50.0000 mg | ORAL_CAPSULE | Freq: Four times a day (QID) | ORAL | Status: DC | PRN
  Administered 2019-10-28 – 2019-11-02 (×3): 50 mg via ORAL
  Filled 2019-10-28 (×3): qty 2

## 2019-10-28 NOTE — Progress Notes (Signed)
Patient ID: Kerry Hamilton, male   DOB: 1965/09/06, 54 y.o.   MRN: [id number]  PROGRESS NOTE    Danniel Tones  HEN:[id number] DOB: 17-May-1965 DOA: 10/11/2019 PCP: Medicine, [facility]    Brief Narrative:  54 year old man with EF 15%, DM 2 was admitted with shortness of breath and chest pain. He had been drinking a lot and using crack for the last week PTA.Laboratory data was noted for decompensated heart failure and troponin around 25.Patient was treated with Lasix IV and seen by the advanced heart failure team. On day 3 after admission patient was noted to be talking to himself and somewhat confused. Patient had been on CIWA protocol and is thought to be in DTs. Ativan has been effective in controlling his agitation.  9/21:Continues to be confused, not following instruction moving upper extremities, CT head obtained that showed acute stroke and neurology was consulted and transferred to Neuro floor 3W. Neurology following, given his alcohol abuse letter cardiology question wet Mahala Menghini s/p high-dose IV thiamine. Patient completed stroke work-up. 9/28: started on eliquis since 7-10 days post stroke as per Dr Pearlean Brownie given his low LVEF and advised to continue on treatment EF is> 30-35% Patient remains dysarthric, at times impulsive, on low-dose Ativan orally but overall improving following more commands and continues to require extensive rehabilitation moving forward 9/30: repeat ct head for possible headache:No acute bleeding demonstrated acutestroke   Assessment & Plan:   Principal Problem:   Acute on chronic systolic CHF (congestive heart failure) (HCC) Active Problems:   Diabetes mellitus type 2, noninsulin dependent (HCC)   Elevated troponin   Polysubstance abuse (HCC)   Cerebral embolism with cerebral infarction  Acute on chronic systolic CHF 2/2 Nonischemic cardiomyopathy, ?Wet Beri beri from [place] abuse/thiamine deficiency-echo 5/20 EF 15% right/left HC 5/20-no CAD. Seen by  cardiology no further work-up and not a candidate for advanced therapies given his poor compliance and substance abuse. his lvef is 15-20% currently.continue with  Lasix 40 mg, Lanoxin, losartan and Aldactone.not on beta-blocker due to low EF.  Acute metabolic encephalopathy/ possible DTs, werneckie's? given his alcohol abuse-S/P high dose thiamine as per neuro. MRI w/ acute stroke. Sister reports a history of schizophrenia and not on medication. Seen by psychiatry and they are going to follow-up closely for full assessment. Continue supportive measures, fall precaution, reorientation.He seems to be alert awake able to speak some sentences,at times keep repeating "I have a stroke", I cannot think", still dysarthric. Encourage PT OT. Continue p.o. Ativan as needed.   Acute moderate sized infarct right frontoparietal region posterior right TIR:WERX by neurology, underwent work-up - lipdid panel LDL at 102,hba1c 6.3. MRA no LVO or stenosis. MRI limited- mod sized acute ischemic infarct rt frontoparietal region, post Rt MCA. Carotid duplex no acute finding. Cont on Aspirin 81, Lipitor. Neurology following and has recommended-anticoagulation 7 to 10 days post stroke( 9-27 to 9-30) given his severe cardiomyopathy with acute stroke and anticoagulation to be discontinued once LVEF >30-35%.Once he is off anticoagulation neuro recommend 30-day cardiac event monitoring or loop recorder to rule out A. Fib: Discussed with Dr. Pearlean Brownie 9/28 and started on Eliquis 5 mg twice daily. Repeat CT head 9/30 no parenchymal bleeding, no significant mass-effect, double cortical hyperdensity in the region of acute stroke which may reflect cortical laminar necrosis and/or minimal petechial hemorrhage" MONITORclosely  Polysubstance abuse:with cocaine, etoh.confirmed by patient'ssisterand that he uses drugs and drinks alcohol. Will need resources for outpatient rehabilitation upon discharge.  History of schizophrenia  not taking medication as per sister. Psychiatric input appreciated they are unable to do full assessment and will keep the patient in the consult list-and has advised to reconsult once he is medically cleared.  Mild AKI: Creatinine holding stable 1-1.4. stable Avoid nephrotoxic agents   T2DM, noninsulin dependent.HBA1C 6.3. Sugar is well controlled on sliding scale insulin.   Elevated troponin/chest pain,troponin mildly elevated: Seen by cardiology no further ischemic work-up. Apparently had a stable cardiac cath in 2020  Mild transaminitisabdomen ultrasound unrevealing,LFTs are borderline.  Mild Hyponatremia monitor closely encourage oral intake   DVT prophylaxis: AC:ZYSAYTK Code Status: Full code  Family Communication: None at bedside Disposition Plan: Smoke followed by assisted living facility  Patient remains inpatient due to unsafe discharge plan. Plans for SNF when available   Consultants:   Psychiatry  Cardiology  Neurology  Procedures:  Carotid Dopplers essentially normal  2D echo EF of 15 to 20%, severely decreased function with global hypokinesis, right ventricular dysfunction, left atrial dilation, right atrial dilation, severe mitral regurg, moderate tricuspid regurg, no aortic regurg normal vena cava  Antimicrobials: Anti-infectives (From admission, onward)   None       Subjective: Reports that his EKG leads are irritating him.  Objective: Vitals:   10/28/19 0330 10/28/19 0814 10/28/19 1212 10/28/19 1543  BP: 127/88 130/88 95/63 110/68  Pulse: 63 75 85 65  Resp: 18 17 20 20   Temp: 97.7 F (36.5 C) (!) 97.4 F (36.3 C) 97.6 F (36.4 C) 98 F (36.7 C)  TempSrc:  Oral Oral Oral  SpO2: 100% 100% 100% 100%  Weight: 72.7 kg     Height:        Intake/Output Summary (Last 24 hours) at 10/28/2019 1711 Last data filed at 10/28/2019 1220 Gross per 24 hour  Intake 1401 ml  Output --  Net 1401 ml   Filed Weights   10/24/19 0500 10/27/19  0354 10/28/19 0330  Weight: 74.4 kg 72.7 kg 72.7 kg    Examination:  General exam: Appears calm and comfortable, dysarthric Respiratory system: Clear to auscultation. Respiratory effort normal. Cardiovascular system: S1 & S2 heard, RRR.  Gastrointestinal system: Abdomen is nondistended, soft and nontender.  Central nervous system: Alert and oriented. No focal neurological deficits. Extremities: Symmetric  Skin: No rashes  Data Reviewed: I have personally reviewed following labs and imaging studies  CBC: Recent Labs  Lab 10/25/19 0845 10/27/19 0148  WBC 3.4* 3.9*  HGB 17.0 16.6  HCT 51.9 50.9  MCV 84.1 84.8  PLT 212 199   Basic Metabolic Panel: Recent Labs  Lab 10/22/19 0259 10/25/19 0845 10/27/19 0148  NA 132* 132* 131*  K 4.8 4.5 4.4  CL 99 96* 97*  CO2 23 23 23   GLUCOSE 95 230* 100*  BUN 32* 30* 30*  CREATININE 1.28* 1.35* 1.27*  CALCIUM 9.7 9.9 9.5   GFR: Estimated Creatinine Clearance: 69.2 mL/min (A) (by C-G formula based on SCr of 1.27 mg/dL (H)). Liver Function Tests: Recent Labs  Lab 10/27/19 0148  AST 50*  ALT 76*  ALKPHOS 122  BILITOT 0.5  PROT 7.8  ALBUMIN 4.0   CBG: Recent Labs  Lab 10/27/19 1532 10/27/19 2121 10/28/19 0608 10/28/19 1217 10/28/19 1545  GLUCAP 103* 141* 109* 140* 89     Radiology Studies: No results found.   Scheduled Meds: . apixaban  5 mg Oral BID  . atorvastatin  40 mg Oral QHS  . digoxin  0.125 mg Oral Daily  . folic acid  1 mg  Oral Daily  . furosemide  40 mg Oral Daily  . insulin aspart  0-5 Units Subcutaneous QHS  . insulin aspart  0-9 Units Subcutaneous TID WC  . losartan  25 mg Oral Daily  . multivitamin with minerals  1 tablet Oral Daily  . spironolactone  25 mg Oral Daily  . thiamine  100 mg Oral Daily   Continuous Infusions:   LOS: 16 days    Reva Bores, MD 10/28/2019 5:11 PM [phone] [facility] If 7PM-7AM, please contact night-coverage 10/28/2019, 5:11 PM

## 2019-10-29 DIAGNOSIS — I5023 Acute on chronic systolic (congestive) heart failure: Secondary | ICD-10-CM | POA: Diagnosis not present

## 2019-10-29 LAB — CBC
HCT: 49 % (ref 39.0–52.0)
Hemoglobin: 16 g/dL (ref 13.0–17.0)
MCH: 27.5 pg (ref 26.0–34.0)
MCHC: 32.7 g/dL (ref 30.0–36.0)
MCV: 84.2 fL (ref 80.0–100.0)
Platelets: 181 10*3/uL (ref 150–400)
RBC: 5.82 MIL/uL — ABNORMAL HIGH (ref 4.22–5.81)
RDW: 12.2 % (ref 11.5–15.5)
WBC: 2.9 10*3/uL — ABNORMAL LOW (ref 4.0–10.5)
nRBC: 0 % (ref 0.0–0.2)

## 2019-10-29 LAB — BASIC METABOLIC PANEL
Anion gap: 11 (ref 5–15)
BUN: 27 mg/dL — ABNORMAL HIGH (ref 6–20)
CO2: 22 mmol/L (ref 22–32)
Calcium: 9.5 mg/dL (ref 8.9–10.3)
Chloride: 98 mmol/L (ref 98–111)
Creatinine, Ser: 1.31 mg/dL — ABNORMAL HIGH (ref 0.61–1.24)
GFR calc Af Amer: >60 mL/min (ref >60)
GFR calc non Af Amer: >60 mL/min (ref >60)
Glucose, Bld: 160 mg/dL — ABNORMAL HIGH (ref 70–99)
Potassium: 4.7 mmol/L (ref 3.5–5.1)
Sodium: 131 mmol/L — ABNORMAL LOW (ref 135–145)

## 2019-10-29 LAB — GLUCOSE, CAPILLARY
Glucose-Capillary: 124 mg/dL — ABNORMAL HIGH (ref 70–99)
Glucose-Capillary: 151 mg/dL — ABNORMAL HIGH (ref 70–99)
Glucose-Capillary: 100 mg/dL — ABNORMAL HIGH (ref 70–99)
Glucose-Capillary: 117 mg/dL — ABNORMAL HIGH (ref 70–99)

## 2019-10-29 NOTE — Progress Notes (Signed)
PROGRESS NOTE    Kerry Hamilton  LDJ:[id number] DOB: 01-Dec-1965 DOA: 10/11/2019 PCP: Medicine, [facility] (Confirm with patient/family/NH records and if not entered, this HAS to be entered at [facility] point of entry. "No PCP" if truly none.)   Brief Narrative: (Start on day 1 of progress note - keep it brief and live) Patient is a 54 year old male history of diabetes mellitus, heart failure with reduced ejection fraction polysubstance abuse and alcohol dependence presented to ED with shortness of breath, altered mental status and chest pain and admitted for management of acute on chronic systolic heart failure, acute metabolic encephalopathy and acute moderate sized infarct in the right frontoparietal region.   Assessment & Plan:   Principal Problem:   Acute on chronic systolic CHF (congestive heart failure) (HCC) Active Problems:   Acute metabolic encephalopathy, improved   Diabetes mellitus type 2, noninsulin dependent (HCC)   Elevated troponin   Hyponatremia   Polysubstance abuse (HCC)   Cerebral embolism with cerebral infarction  Acute on chronic systolic congestive heart failure secondary to nonischemic cardiomyopathy/wet beriberi from alcohol abuse due to thiamine deficiency Patient evaluated by cardiology echocardiogram that showed ejection fraction of 15 to 20%.  Recommended no further work-up because of poor compliance and substance abuse.  Continue Lasix 40 mg daily, digoxin, spironolactone and losartan.  No beta-blockers indicated because of low ejection fraction.  Patient is also started on thiamine because of concerning weight beriberi secondary to alcohol abuse.  Daily weights with intake and output monitoring.  Acute metabolic encephalopathy Improved.  Acute metabolic encephalopathy was most likely secondary to alcohol abuse, stroke.  Patient is most probably at his baseline at this time.  Acute ischemic infarct in right frontoparietal region Continue aspirin and  Lipitor.  Patient also started on Eliquis 5 mg daily.  MRI showed limited moderate sized acute ischemic infarct in the right frontoparietal region.  Carotid duplex was negative for acute finding MRI brain and neck negative for large vessel obstruction or stenosis.  Neurochecks and fall precautions in place.  Elevated troponin Troponin mildly elevated.  Cardiology did not recommend further work-up.  Patient had stable cardiac catheterization in 2020  Polysubstance abuse Patient will be counseled about quitting polysubstance use once he is  become fully stable.  Diabetes mellitus type 2 Continue sliding scale insulin.  Blood glucose is well controlled  Tobacco dependence Continue nicotine gum  Anxiety Lorazepam as needed  Hyperlipidemia Atorvastatin  Hyponatremia: Sodium is 131 today.  Continue to monitor    DVT prophylaxis: Eliquis Code Status: Full Code Family Communication: (Specify name, relationship & date discussed. NO "discussed with patient") Disposition Plan: PT recommended skilled nursing facility   Consultants:   Cardiology  Neurology  Procedures: (Don't include imaging studies which can be auto populated. Include things that cannot be auto populated i.e. Echo, Carotid and venous dopplers, Foley, Bipap, HD, tubes/drains, wound vac, central lines etc)  Carotid duplex ultrasound  Echocardiogram  Antimicrobials: (specify start and planned stop date. Auto populated tables are space occupying and do not give end dates)      Subjective:  Patient seen and evaluated at the bedside.  Patient is able to answer most of the questions appropriately.  Patient is complaining of severe generalized weakness and exertional dyspnea but denies fever, chills, chest pain, nausea, vomiting, abdominal pain urinary symptoms.  Objective: Vitals:   10/29/19 0500 10/29/19 0711 10/29/19 1134 10/29/19 1633  BP:  109/70 104/64 91/64  Pulse:  66 62 66  Resp:  18 16 14   Temp:  98.2  F (36.8 C) 98.5 F (36.9 C) 98.2 F (36.8 C)  TempSrc:  Oral Oral Oral  SpO2:  98% 99% 99%  Weight: 72.1 kg     Height:        Intake/Output Summary (Last 24 hours) at 10/29/2019 1655 Last data filed at 10/29/2019 0833 Gross per 24 hour  Intake 716 ml  Output --  Net 716 ml   Filed Weights   10/27/19 0354 10/28/19 0330 10/29/19 0500  Weight: 72.7 kg 72.7 kg 72.1 kg    Examination:  General exam: Appears calm and comfortable  Respiratory system: Clear to auscultation. Respiratory effort normal. Cardiovascular system: S1 & S2 heard, RRR. No JVD, murmurs, rubs, gallops or clicks. No pedal edema. Gastrointestinal system: Abdomen is nondistended, soft and nontender. No organomegaly or masses felt. Normal bowel sounds heard. Central nervous system: Alert and oriented. No focal neurological deficits.  Normal speech Extremities: Symmetric  Skin: No rashes, lesions or ulcers Psychiatry: Judgement and insight appear normal. Mood & affect appropriate.     Data Reviewed: I have personally reviewed following labs and imaging studies  CBC: Recent Labs  Lab 10/25/19 0845 10/27/19 0148 10/29/19 1004  WBC 3.4* 3.9* 2.9*  HGB 17.0 16.6 16.0  HCT 51.9 50.9 49.0  MCV 84.1 84.8 84.2  PLT 212 199 181   Basic Metabolic Panel: Recent Labs  Lab 10/25/19 0845 10/27/19 0148 10/29/19 1004  NA 132* 131* 131*  K 4.5 4.4 4.7  CL 96* 97* 98  CO2 23 23 22   GLUCOSE 230* 100* 160*  BUN 30* 30* 27*  CREATININE 1.35* 1.27* 1.31*  CALCIUM 9.9 9.5 9.5   GFR: Estimated Creatinine Clearance: 66.5 mL/min (A) (by C-G formula based on SCr of 1.31 mg/dL (H)). Liver Function Tests: Recent Labs  Lab 10/27/19 0148  AST 50*  ALT 76*  ALKPHOS 122  BILITOT 0.5  PROT 7.8  ALBUMIN 4.0   No results for input(s): LIPASE, AMYLASE in the last 168 hours. No results for input(s): AMMONIA in the last 168 hours. Coagulation Profile: No results for input(s): INR, PROTIME in the last 168  hours. Cardiac Enzymes: No results for input(s): CKTOTAL, CKMB, CKMBINDEX, TROPONINI in the last 168 hours. BNP (last 3 results) No results for input(s): PROBNP in the last 8760 hours. HbA1C: No results for input(s): HGBA1C in the last 72 hours. CBG: Recent Labs  Lab 10/28/19 1545 10/28/19 2106 10/29/19 0619 10/29/19 1133 10/29/19 1620  GLUCAP 89 165* 124* 151* 100*   Lipid Profile: No results for input(s): CHOL, HDL, LDLCALC, TRIG, CHOLHDL, LDLDIRECT in the last 72 hours. Thyroid Function Tests: No results for input(s): TSH, T4TOTAL, FREET4, T3FREE, THYROIDAB in the last 72 hours. Anemia Panel: No results for input(s): VITAMINB12, FOLATE, FERRITIN, TIBC, IRON, RETICCTPCT in the last 72 hours. Sepsis Labs: No results for input(s): PROCALCITON, LATICACIDVEN in the last 168 hours.  No results found for this or any previous visit (from the past 240 hour(s)).       Radiology Studies: No results found.      Scheduled Meds:  apixaban  5 mg Oral BID   atorvastatin  40 mg Oral QHS   digoxin  0.125 mg Oral Daily   folic acid  1 mg Oral Daily   furosemide  40 mg Oral Daily   insulin aspart  0-5 Units Subcutaneous QHS   insulin aspart  0-9 Units Subcutaneous TID WC   losartan  25 mg Oral Daily  multivitamin with minerals  1 tablet Oral Daily   spironolactone  25 mg Oral Daily   thiamine  100 mg Oral Daily   Continuous Infusions:   LOS: 17 days        Thalia Party, MD [facility] Pager [phone]  If 7PM-7AM, please contact night-coverage [email or web address] Password [email or web address] 10/29/2019, 4:55 PM

## 2019-10-29 NOTE — Progress Notes (Signed)
  Speech Language Pathology Treatment: Cognitive-Linquistic  Patient Details Name: Kerry Hamilton MRN: [id number] DOB: 1965/05/27 Today's Date: 10/29/2019 Time: 6812-7517 SLP Time Calculation (min) (ACUTE ONLY): 15 min  Assessment / Plan / Recommendation Clinical Impression  Pt continues with persisting, severe aphasia which appears primarily receptive in nature. He formulates broken sentences with some dysfluencies, short phrases with occasional use of syntax.  He demonstrated ability to repeat examiner with single words on occasion; there were severe deficits with following commands, particularly when relying only on auditory input.  Pt has difficulty following instructions to look at target items, instead maintaining eye contact with examiner and requiring max verbal/visual/tactile cues to redirect attention. When pen was physically placed in pt's right hand and paper tapped repetitively, he took pen into left hand and copied "Kerry Hamilton" on the page.  He was unable to follow commands to draw basic shapes, eventually repeating the word "circle" but unable to follow instructions to match or draw. Verbal output today was limited -pt primarily looked at examiner and required constant multimodal cues (including physical modeling and moving hands in unison) to achieve any output that was relevant to target.  He needs intensive speech follow-up.    HPI HPI: Pt is a 54 y.o. male admitted 10/11/19 with SOB and chest pain; chart states pt with ETOH and cocaine use for the past week. Workup for acute on chronic CHF secondary to nonischemic cardiomyopathy, suspect ETOH abuse. Worsening confusion noted 9/20-9/21; MRI with acute, moderate-sized infarct in R frontoparietal region. PMH includes substance abuse, DM2, CAD, depression.      SLP Plan  Continue with current plan of care       Recommendations                   Oral Care Recommendations: Oral care BID Follow up Recommendations: Skilled Nursing  facility;24 hour supervision/assistance SLP Visit Diagnosis: Aphasia (R47.01) Plan: Continue with current plan of care       GO                Kerry Hamilton 10/29/2019, 5:16 PM  Kerry Hamilton L. Kerry Frederic, MA CCC/SLP Acute Rehabilitation Services Office number [phone] Pager [phone]

## 2019-10-29 NOTE — Progress Notes (Signed)
Physical Therapy Treatment Patient Details Name: Kerry Hamilton MRN: [id number] DOB: 1965/09/09 Today's Date: 10/29/2019    History of Present Illness Pt is a 54 y.o. male admitted 10/11/19 with SOB and chest pain; chart states pt with ETOH and cocaine use for the past week. Workup for acute on chronic CHF secondary to nonischemic cardiomyopathy, suspect ETOH abuse. Worsening confusion noted 9/20-9/21; MRI with acute, moderate-sized infarct in R frontoparietal region. Further work-up ?Wet Beri beri from [place] abuse/thiamine deficiency-echo 5/20 EF 15%PMH includes substance abuse, DM2, CAD, depression.    PT Comments    Patient distracted by either dizziness or headache during session. Orthostatic BPs assessed and pt BP stable (and after ambulation reassessed and stable). Frequent blinking, eye closing, shaking his head. Continues with very delayed or no response to purely verbal commands. Easily guided/manually facilitated to mobilize, however limited ambulation distance due to above symptoms.     Follow Up Recommendations  SNF;Supervision/Assistance - 24 hour (vs long-term care)     Equipment Recommendations  Other (comment) (TBD)    Recommendations for Other Services       Precautions / Restrictions Precautions Precautions: Fall    Mobility  Bed Mobility Overal bed mobility: Needs Assistance Bed Mobility: Supine to Sit     Supine to sit: Supervision     General bed mobility comments: pt able to sit up to long-sitting in bed without faciliation; required incr time and multi-modal cues to pivot and scoot to get feet on the floor  Transfers Overall transfer level: Needs assistance Equipment used: 1 person hand held assist Transfers: Sit to/from Stand Sit to Stand: Min assist Stand pivot transfers: Min assist       General transfer comment: min assist to facilitate/initiate task  Ambulation/Gait Ambulation/Gait assistance: Min assist   Assistive device: 1 person hand held  assist;None Gait Pattern/deviations: Step-through pattern;Decreased stride length;Wide base of support Gait velocity: Decreased   General Gait Details: guiding assist/facilitation along with verbal instructions; required continued facilitation to continue to walk; frequent blinking, squinting/closing eyes and shaking head (?dizzy vs headache)   Stairs             Wheelchair Mobility    Modified Rankin (Stroke Patients Only) Modified Rankin (Stroke Patients Only) Pre-Morbid Rankin Score: No symptoms Modified Rankin: Moderately severe disability     Balance Overall balance assessment: Needs assistance Sitting-balance support: No upper extremity supported;Feet supported Sitting balance-Leahy Scale: Good     Standing balance support: No upper extremity supported Standing balance-Leahy Scale: Fair                   Standardized Balance Assessment Standardized Balance Assessment :  (pt unable to follow instructions to complete  testing)          Cognition Arousal/Alertness: Awake/alert Behavior During Therapy: Flat affect Overall Cognitive Status: No family/caregiver present to determine baseline cognitive functioning                                 General Comments: Very delayed responses; typically 1-3 words at most; frequently repeats what has been said to him; frequently requires manual facilitation/guidance to initiate mobility      Exercises      General Comments General comments (skin integrity, edema, etc.): RN asked PT to assess if pt is safe to ambulate in the room by himself. Discussed his frequent eye closing, shaking his head. Was NOT orthostatic with BPs 90s/60s seated,  standing and after ambulation in standing.       Pertinent Vitals/Pain Pain Assessment: Faces Faces Pain Scale: Hurts even more Pain Location: ?headache Pain Descriptors / Indicators:  (pt unable; freq squinting eyes, shaking head) Pain Intervention(s): Other  (comment) (RN notified ?HA)    Home Living                      Prior Function            PT Goals (current goals can now be found in the care plan section) Acute Rehab PT Goals Patient Stated Goal: not stated Time For Goal Achievement: 10/31/19 Potential to Achieve Goals: Fair Progress towards PT goals: Progressing toward goals    Frequency    Min 2X/week      PT Plan Current plan remains appropriate    Co-evaluation              AM-PAC PT "6 Clicks" Mobility   Outcome Measure  Help needed turning from your back to your side while in a flat bed without using bedrails?: A Little Help needed moving from lying on your back to sitting on the side of a flat bed without using bedrails?: A Lot Help needed moving to and from a bed to a chair (including a wheelchair)?: A Little Help needed standing up from a chair using your arms (e.g., wheelchair or bedside chair)?: A Little Help needed to walk in hospital room?: A Little Help needed climbing 3-5 steps with a railing? : A Lot 6 Click Score: 16    End of Session Equipment Utilized During Treatment: Gait belt Activity Tolerance: Other (comment) (appeared either dizzy or headache/pt could not verbalize) Patient left: with call bell/phone within reach;in chair;with chair alarm set Nurse Communication: Mobility status (do not recommend up alone) PT Visit Diagnosis: Other abnormalities of gait and mobility (R26.89);Muscle weakness (generalized) (M62.81);Other symptoms and signs involving the nervous system (R29.898)     Time: 3716-9678 PT Time Calculation (min) (ACUTE ONLY): 20 min  Charges:  $Gait Training: 8-22 mins                      [name], PT Pager [phone]    Zena Amos 10/29/2019, 4:30 PM

## 2019-10-30 DIAGNOSIS — I5023 Acute on chronic systolic (congestive) heart failure: Secondary | ICD-10-CM | POA: Diagnosis not present

## 2019-10-30 LAB — GLUCOSE, CAPILLARY
Glucose-Capillary: 141 mg/dL — ABNORMAL HIGH (ref 70–99)
Glucose-Capillary: 137 mg/dL — ABNORMAL HIGH (ref 70–99)
Glucose-Capillary: 103 mg/dL — ABNORMAL HIGH (ref 70–99)
Glucose-Capillary: 162 mg/dL — ABNORMAL HIGH (ref 70–99)

## 2019-10-30 LAB — CBC
HCT: 49.3 % (ref 39.0–52.0)
Hemoglobin: 15.6 g/dL (ref 13.0–17.0)
MCH: 26.7 pg (ref 26.0–34.0)
MCHC: 31.6 g/dL (ref 30.0–36.0)
MCV: 84.3 fL (ref 80.0–100.0)
Platelets: 161 10*3/uL (ref 150–400)
RBC: 5.85 MIL/uL — ABNORMAL HIGH (ref 4.22–5.81)
RDW: 11.9 % (ref 11.5–15.5)
WBC: 3.5 10*3/uL — ABNORMAL LOW (ref 4.0–10.5)
nRBC: 0 % (ref 0.0–0.2)

## 2019-10-30 LAB — BASIC METABOLIC PANEL
Anion gap: 12 (ref 5–15)
BUN: 31 mg/dL — ABNORMAL HIGH (ref 6–20)
CO2: 20 mmol/L — ABNORMAL LOW (ref 22–32)
Calcium: 9.5 mg/dL (ref 8.9–10.3)
Chloride: 98 mmol/L (ref 98–111)
Creatinine, Ser: 1.16 mg/dL (ref 0.61–1.24)
GFR calc Af Amer: >60 mL/min (ref >60)
GFR calc non Af Amer: >60 mL/min (ref >60)
Glucose, Bld: 83 mg/dL (ref 70–99)
Potassium: 4.8 mmol/L (ref 3.5–5.1)
Sodium: 130 mmol/L — ABNORMAL LOW (ref 135–145)

## 2019-10-30 NOTE — Plan of Care (Signed)
  Problem: Education: Goal: Knowledge of General Education information will improve Description: Including pain rating scale, medication(s)/side effects and non-pharmacologic comfort measures Outcome: Progressing   Problem: Health Behavior/Discharge Planning: Goal: Ability to manage health-related needs will improve Outcome: Progressing   Problem: Clinical Measurements: Goal: Ability to maintain clinical measurements within normal limits will improve Outcome: Progressing Goal: Will remain free from infection Outcome: Progressing Goal: Diagnostic test results will improve Outcome: Progressing Goal: Respiratory complications will improve Outcome: Progressing Goal: Cardiovascular complication will be avoided Outcome: Progressing   Problem: Activity: Goal: Risk for activity intolerance will decrease Outcome: Progressing   Problem: Nutrition: Goal: Adequate nutrition will be maintained Outcome: Progressing   Problem: Coping: Goal: Level of anxiety will decrease Outcome: Progressing   Problem: Elimination: Goal: Will not experience complications related to bowel motility Outcome: Progressing Goal: Will not experience complications related to urinary retention Outcome: Progressing   Problem: Pain Managment: Goal: General experience of comfort will improve Outcome: Progressing   Problem: Safety: Goal: Ability to remain free from injury will improve Outcome: Progressing   Problem: Skin Integrity: Goal: Risk for impaired skin integrity will decrease Outcome: Progressing   Problem: Activity: Goal: Capacity to carry out activities will improve Outcome: Progressing   Problem: Cardiac: Goal: Ability to achieve and maintain adequate cardiopulmonary perfusion will improve Outcome: Progressing   Problem: Education: Goal: Knowledge of secondary prevention will improve Outcome: Progressing Goal: Knowledge of patient specific risk factors addressed and post discharge goals  established will improve Outcome: Progressing Goal: Individualized Educational Video(s) Outcome: Progressing   Problem: Self-Care: Goal: Verbalization of feelings and concerns over difficulty with self-care will improve Outcome: Progressing Goal: Ability to communicate needs accurately will improve Outcome: Progressing   Problem: Education: Goal: Knowledge of disease or condition will improve Outcome: Progressing Goal: Knowledge of secondary prevention will improve Outcome: Progressing Goal: Knowledge of patient specific risk factors addressed and post discharge goals established will improve Outcome: Progressing Goal: Individualized Educational Video(s) Outcome: Progressing

## 2019-10-30 NOTE — Progress Notes (Signed)
PROGRESS NOTE    Kerry Hamilton  MWN:[id number] DOB: 1965/04/04 DOA: 10/11/2019 PCP: Medicine, [facility] (Confirm with patient/family/NH records and if not entered, this HAS to be entered at [facility] point of entry. "No PCP" if truly none.)   Brief Narrative: (Start on day 1 of progress note - keep it brief and live) Patient is a 54 year old male history of diabetes mellitus, heart failure with reduced ejection fraction polysubstance abuse and alcohol dependence presented to ED with shortness of breath, altered mental status and chest pain and admitted for management of acute on chronic systolic heart failure, acute metabolic encephalopathy and acute moderate sized infarct in the right frontoparietal region.  Physical therapy recommended SNF and case management is working on patient's placement.   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  Recommended no further work-up because of poor compliance and substance abuse.  Continue Lasix 40 mg daily, digoxin, spironolactone and losartan.  No beta-blockers indicated because of low ejection fraction.  Patient is also started on thiamine because of concerning weight beriberi secondary to alcohol abuse.  Daily weights with intake and output monitoring.  Acute metabolic encephalopathy Improved.  Acute metabolic encephalopathy was most likely secondary to alcohol abuse, stroke.  Patient is most probably at his baseline at this time.   Patient is oriented to place and person but not to time.  Acute ischemic infarct in right frontoparietal region Continue aspirin and Lipitor.  Patient also started on Eliquis 5 mg daily.  MRI showed limited moderate sized acute ischemic infarct in the right frontoparietal region.  Carotid duplex was negative for acute finding MRI brain and neck negative for large vessel obstruction or stenosis.  Neurochecks and fall precautions in place.  Elevated troponin Troponin mildly elevated.  Cardiology did not recommend further work-up.  Patient had stable cardiac catheterization in 2020  Polysubstance abuse Patient will be counseled about quitting polysubstance use once he is  become fully stable.  Diabetes mellitus type 2 Continue sliding scale insulin.  Blood glucose is well controlled  Tobacco dependence Continue nicotine gum  Anxiety Lorazepam as needed  Hyperlipidemia Atorvastatin  Hyponatremia: Sodium is 130 today.  Continue to monitor    DVT prophylaxis: Eliquis Code Status: Full code Family Communication: No family member present at bedside Disposition Plan: Physical therapy recommended skilled nursing facility and case management is working on his placement.   Consultants:   Cardiology  Neurology  Procedures: (Don't include imaging studies which can be auto populated. Include things that cannot be auto populated i.e. Echo, Carotid and venous dopplers, Foley, Bipap, HD, tubes/drains, wound vac, central lines etc)  Carotid duplex ultrasound  Echocardiogram  Antimicrobials: (specify start and planned stop date. Auto populated tables are space occupying and do not give end dates)     Subjective: Patient is seen at bedside this morning.  Although patient speaks very little but able to answer most of the questions.  Patient is complaining of mild headache but denies any other complaints.  Objective: Vitals:   10/30/19 0911 10/30/19 1025 10/30/19 1153  10/30/19 1646  BP: 97/71  101/66 96/63  Pulse: 67 61 62 66  Resp:   16 20  Temp: 98 F (36.7 C)  98.3 F (36.8 C) 98.5 F (36.9 C)  TempSrc: Oral  Oral Oral  SpO2: 99%  99% 98%  Weight:      Height:       No intake or output data in the 24 hours ending 10/30/19 1706 Filed Weights   10/28/19 0330 10/29/19 0500 10/30/19 0500  Weight: 72.7 kg 72.1 kg 73.9 kg    Examination:  General exam: Appears calm and comfortable  Respiratory system: Clear to auscultation. Respiratory effort normal. Cardiovascular system: S1 & S2 heard, RRR. No JVD, murmurs, rubs, gallops or clicks. No pedal edema. Gastrointestinal system: Abdomen is nondistended, soft and nontender. No organomegaly or masses felt. Normal bowel sounds heard. Central nervous system: Alert and oriented. No focal neurological deficits. Extremities: Symmetric.  Muscular strength and sensations intact in all 4 extremities Skin: No rashes, lesions or ulcers Psychiatry: Patient is awake, alert and oriented to place and person but not to time.  Patient is not in any anxiety or agitation at this time.    Data Reviewed: I have personally reviewed following labs and imaging studies  CBC: Recent Labs  Lab 10/25/19 0845 10/27/19 0148 10/29/19 1004 10/30/19 0211  WBC 3.4* 3.9* 2.9* 3.5*  HGB 17.0 16.6 16.0 15.6  HCT 51.9 50.9 49.0 49.3  MCV 84.1 84.8 84.2 84.3  PLT 212 199 181 161   Basic Metabolic Panel: Recent Labs  Lab 10/25/19 0845 10/27/19 0148 10/29/19 1004 10/30/19 0211  NA 132* 131* 131* 130*  K 4.5 4.4 4.7 4.8  CL 96* 97* 98 98  CO2 23 23 22  20*  GLUCOSE 230* 100* 160* 83  BUN 30* 30* 27* 31*  CREATININE 1.35* 1.27* 1.31* 1.16  CALCIUM 9.9 9.5 9.5 9.5   GFR: Estimated Creatinine Clearance: 77 mL/min (by C-G formula based on SCr of 1.16 mg/dL). Liver Function Tests: Recent Labs  Lab 10/27/19 0148  AST 50*  ALT 76*  ALKPHOS 122  BILITOT 0.5  PROT 7.8  ALBUMIN 4.0   No results for input(s): LIPASE,  AMYLASE in the last 168 hours. No results for input(s): AMMONIA in the last 168 hours. Coagulation Profile: No results for input(s): INR, PROTIME in the last 168 hours. Cardiac Enzymes: No results for input(s): CKTOTAL, CKMB, CKMBINDEX, TROPONINI in the last 168 hours. BNP (last 3 results) No results for input(s): PROBNP in the last 8760 hours. HbA1C: No results for input(s): HGBA1C in the last 72 hours. CBG: Recent Labs  Lab 10/29/19 1620 10/29/19 2107 10/30/19 0612 10/30/19 1152 10/30/19 1645  GLUCAP 100* 117* 141* 137* 103*   Lipid Profile: No results for input(s): CHOL, HDL, LDLCALC, TRIG, CHOLHDL, LDLDIRECT in the last 72 hours. Thyroid Function Tests: No results for input(s): TSH, T4TOTAL, FREET4, T3FREE, THYROIDAB in the last 72 hours. Anemia Panel: No results for input(s): VITAMINB12, FOLATE, FERRITIN, TIBC, IRON, RETICCTPCT in the last 72 hours. Sepsis Labs: No results for input(s): PROCALCITON, LATICACIDVEN in the last 168 hours.  No results found for this or any previous visit (from the past 240 hour(s)).       Radiology Studies: No results found.      Scheduled Meds:  apixaban  5 mg Oral BID   atorvastatin  40 mg Oral QHS   digoxin  0.125 mg Oral Daily   folic acid  1 mg Oral Daily   furosemide  40 mg Oral Daily   insulin aspart  0-5 Units Subcutaneous QHS   insulin aspart  0-9 Units Subcutaneous TID WC   losartan  25 mg Oral Daily   multivitamin with minerals  1 tablet Oral Daily   spironolactone  25 mg Oral Daily   thiamine  100 mg Oral Daily   Continuous Infusions:   LOS: 18 days        Thalia Party, MD [facility] Pager [phone]  If 7PM-7AM, please contact night-coverage [email or web address] Password  10/30/2019, 5:06 PM

## 2019-10-31 DIAGNOSIS — I5023 Acute on chronic systolic (congestive) heart failure: Secondary | ICD-10-CM | POA: Diagnosis not present

## 2019-10-31 LAB — GLUCOSE, CAPILLARY
Glucose-Capillary: 94 mg/dL (ref 70–99)
Glucose-Capillary: 145 mg/dL — ABNORMAL HIGH (ref 70–99)
Glucose-Capillary: 116 mg/dL — ABNORMAL HIGH (ref 70–99)
Glucose-Capillary: 102 mg/dL — ABNORMAL HIGH (ref 70–99)

## 2019-10-31 NOTE — Progress Notes (Signed)
Occupational Therapy Treatment Patient Details Name: Kerry Hamilton MRN: [id number] DOB: Sep 21, 1965 Today's Date: 10/31/2019    History of present illness Pt is a 54 y.o. male admitted 10/11/19 with SOB and chest pain; chart states pt with ETOH and cocaine use for the past week. Workup for acute on chronic CHF secondary to nonischemic cardiomyopathy, suspect ETOH abuse. Worsening confusion noted 9/20-9/21; MRI with acute, moderate-sized infarct in R frontoparietal region. Further work-up ?Wet Beri beri from [place] abuse/thiamine deficiency-echo 5/20 EF 15%PMH includes substance abuse, DM2, CAD, depression.   OT comments  Question if patient is orthostatic. Rn informed and reports she will obtain orthostatics on patient. Pt transferred to sink with perseverating on sugar levels and headache that then became "dizzy dizzy" . BP attempted and unable to obatin the first cycle after movement. Pt static sitting and after 3 minutes BP 108/75 (83) . Pt had a bp supine of 110/67. Pt has symptoms consistent with drops in BP this session.   Follow Up Recommendations  SNF    Equipment Recommendations  None recommended by OT    Recommendations for Other Services      Precautions / Restrictions Precautions Precautions: Fall       Mobility Bed Mobility Overal bed mobility: Needs Assistance Bed Mobility: Supine to Sit;Sit to Supine     Supine to sit: Mod assist Sit to supine: Min guard   General bed mobility comments: does not initiate task needs physcial (A)  Transfers Overall transfer level: Needs assistance Equipment used: Rolling walker (2 wheeled) Transfers: Sit to/from Stand Sit to Stand: Min guard         General transfer comment: cues for hand placement    Balance Overall balance assessment: Needs assistance         Standing balance support: Bilateral upper extremity supported;During functional activity Standing balance-Leahy Scale: Fair                              ADL either performed or assessed with clinical judgement   ADL Overall ADL's : Needs assistance/impaired   Eating/Feeding Details (indicate cue type and reason): eating spaghetti in chair finished and then alarm sounding when OT arriving from patient deciding to transfer back to bed without (A)    Grooming Details (indicate cue type and reason): Pt transferred to sink level and agreeable to oral care. pt c/o dizziness increased. pt verbalized desire to brush teeth but does not initaite. pt transferred back to bed and unable to obtain BP. Second attempt BP 108/75 (83)                               General ADL Comments: requires tactile input to facilitate movement to EOB      Vision       Perception     Praxis      Cognition Arousal/Alertness: Awake/alert Behavior During Therapy: Anxious                                   General Comments: pt requesting blood sugar and perseverating on blood sugar even after RN comes to room to address with patient. Pt keeps saying too much too much. pt c/o headache and dizziness        Exercises     Shoulder Instructions  General Comments      Pertinent Vitals/ Pain       Pain Assessment: Faces Faces Pain Scale: Hurts even more Pain Location: headache Pain Descriptors / Indicators: Headache (reports dizziness) Pain Intervention(s): Monitored during session;Repositioned  Home Living                                          Prior Functioning/Environment              Frequency  Min 2X/week        Progress Toward Goals  OT Goals(current goals can now be found in the care plan section)  Progress towards OT goals: Progressing toward goals  Acute Rehab OT Goals Patient Stated Goal: not stated OT Goal Formulation: Patient unable to participate in goal setting Time For Goal Achievement: 10/31/19 Potential to Achieve Goals: Good ADL Goals Pt Will Perform Grooming: with  supervision;standing Pt Will Perform Upper Body Dressing: with supervision;sitting Pt Will Perform Lower Body Dressing: with supervision;sit to/from stand Pt Will Transfer to Toilet: with supervision;ambulating Pt Will Perform Toileting - Clothing Manipulation and hygiene: with supervision;sit to/from stand Additional ADL Goal #1: Pt will initiate task without cues and attend through task completion 50% of the time  Plan Discharge plan needs to be updated    Co-evaluation                 AM-PAC OT "6 Clicks" Daily Activity     Outcome Measure   Help from another person eating meals?: A Little Help from another person taking care of personal grooming?: A Little Help from another person toileting, which includes using toliet, bedpan, or urinal?: A Little Help from another person bathing (including washing, rinsing, drying)?: A Lot Help from another person to put on and taking off regular upper body clothing?: A Little Help from another person to put on and taking off regular lower body clothing?: A Lot 6 Click Score: 16    End of Session Equipment Utilized During Treatment: Gait belt;Rolling walker  OT Visit Diagnosis: Unsteadiness on feet (R26.81);Muscle weakness (generalized) (M62.81)   Activity Tolerance Patient tolerated treatment well   Patient Left in bed;with call bell/phone within reach;with bed alarm set   Nurse Communication Mobility status;Precautions        Time: 8638-1771 OT Time Calculation (min): 39 min  Charges: OT General Charges $OT Visit: 1 Visit OT Treatments $Self Care/Home Management : 23-37 mins   Brynn, OTR/L  Acute Rehabilitation Services Pager: [phone] Office: [phone] .    Mateo Flow 10/31/2019, 4:48 PM

## 2019-10-31 NOTE — Progress Notes (Signed)
PROGRESS NOTE    Kerry Hamilton  FWY:[id number] DOB: 06-07-1965 DOA: 10/11/2019 PCP: Medicine, [facility] (Confirm with patient/family/NH records and if not entered, this HAS to be entered at [facility] point of entry. "No PCP" if truly none.)   Brief Narrative: (Start on day 1 of progress note - keep it brief and live) Patient is a 54 year old male history of diabetes mellitus, heart failure with reduced ejection fraction polysubstance abuse and alcohol dependence presented to ED with shortness of breath, altered mental status and chest pain and admitted for management of acute on chronic systolic heart failure, acute metabolic encephalopathy and acute moderate sized infarct in the right frontoparietal region.  Physical therapy recommended SNF and case management is still working on patient's placement.   Assessment & Plan:   Principal Problem:   Acute on chronic systolic CHF (congestive heart failure) (HCC) Active Problems:   Diabetes mellitus type 2, noninsulin dependent (HCC)   Elevated troponin   Polysubstance abuse (HCC)   Cerebral embolism with cerebral infarction   Acute on chronic systolic congestive heart failure secondary to nonischemic cardiomyopathy/wetberiberi from alcohol abuse due to thiamine deficiency Patient evaluated by cardiology echocardiogram that showed ejection fraction of 15 to 20%. Recommended no further work-up because of poor compliance and substance abuse. Continue Lasix 40 mg daily, digoxin, spironolactone and losartan. No beta-blockers indicated because of low ejection fraction. Patient is also started on thiamine because of concerning weight beriberi secondary to alcohol abuse. Daily weights with intake and output monitoring.  Acute metabolic encephalopathy Improved. Acute metabolic encephalopathy was most likely secondary to alcohol abuse, stroke. Patient is most probably at his baseline at this time.  Patient's edition his much improved today  and he is able to answer most of the questions appropriately. Patient is oriented to place person, time and situation today. Patient is pending placement to skilled nursing facility and case management working on his placement.  Acute ischemic infarct in right frontoparietal region Continue aspirin and Lipitor. Patient also started on Eliquis 5 mg daily. MRI showed limited moderate sized acute ischemic infarct in the right frontoparietal region. Carotid duplex was negative for acute finding MRI brain and neck negative for large vessel obstruction or stenosis. Neurochecks and fall precautions in place.  Elevated troponin Troponin mildly elevated. Cardiology did not recommend further work-up. Patient had stable cardiac catheterization in 2020  Polysubstance abuse Patient will be counseled about quitting polysubstance useonce he isbecome fully stable.  Diabetes mellitus type 2 Continue sliding scale insulin. Blood glucose is well controlled  Tobacco dependence Continue nicotine gum  Anxiety Lorazepam as needed  Hyperlipidemia Atorvastatin  Hyponatremia: Sodium was 130 yesterday. Continue to monitor    DVT prophylaxis: Eliquis Code Status: Full code Family Communication: No family member present at the bedside Disposition Plan: Skilled nursing facility Consultants:   Cardiology  Neurology  Procedures: (Don't include imaging studies which can be auto populated. Include things that cannot be auto populated i.e. Echo, Carotid and venous dopplers, Foley, Bipap, HD, tubes/drains, wound vac, central lines etc)  Carotid duplex ultrasound  Echocardiogram  Antimicrobials: (specify start and planned stop date. Auto populated tables are space occupying and do not give end dates)     Subjective: Patient seen at the bedside today. Patient complaining of occasional headache and dizziness but denies fever, chills, chest pain, shortness of breath, nausea, vomiting,  abdominal pain and urinary symptoms.  Objective: Vitals:   10/30/19 2030 10/31/19 0015 10/31/19 0320 10/31/19 0759  BP: 114/67 102/70 105/72  109/70  Pulse: 60 (!) 57 61 69  Resp:  16 16 14   Temp: 98 F (36.7 C) 97.7 F (36.5 C) (!) 97.5 F (36.4 C) 97.7 F (36.5 C)  TempSrc: Oral Oral Oral Oral  SpO2: 99% 100% 100% 100%  Weight:      Height:        Intake/Output Summary (Last 24 hours) at 10/31/2019 0819 Last data filed at 10/31/2019 0411 Gross per 24 hour  Intake --  Output 400 ml  Net -400 ml   Filed Weights   10/28/19 0330 10/29/19 0500 10/30/19 0500  Weight: 72.7 kg 72.1 kg 73.9 kg    Examination:  General exam: Appears calm and comfortable  Respiratory system: Clear to auscultation. Respiratory effort normal. Cardiovascular system: S1 & S2 heard, RRR. No JVD, murmurs, rubs, gallops or clicks. No pedal edema. Gastrointestinal system: Abdomen is nondistended, soft and nontender. No organomegaly or masses felt. Normal bowel sounds heard. Central nervous system: Alert and oriented. No focal neurological deficits. Extremities: Symmetric.  Muscular strength and sensations intact in all 4 extremities Skin: No rashes, lesions or ulcers Psychiatry: Patient is awake, alert and oriented to place and time.  Patient is not in any anxiety or agitation at this time. 12/30/19 is communicating very well today   Data Reviewed: I have personally reviewed following labs and imaging studies  CBC: Recent Labs  Lab 10/25/19 0845 10/27/19 0148 10/29/19 1004 10/30/19 0211  WBC 3.4* 3.9* 2.9* 3.5*  HGB 17.0 16.6 16.0 15.6  HCT 51.9 50.9 49.0 49.3  MCV 84.1 84.8 84.2 84.3  PLT 212 199 181 161   Basic Metabolic Panel: Recent Labs  Lab 10/25/19 0845 10/27/19 0148 10/29/19 1004 10/30/19 0211  NA 132* 131* 131* 130*  K 4.5 4.4 4.7 4.8  CL 96* 97* 98 98  CO2 23 23 22  20*  GLUCOSE 230* 100* 160* 83  BUN 30* 30* 27* 31*  CREATININE 1.35* 1.27* 1.31* 1.16  CALCIUM 9.9 9.5 9.5 9.5    GFR: Estimated Creatinine Clearance: 77 mL/min (by C-G formula based on SCr of 1.16 mg/dL). Liver Function Tests: Recent Labs  Lab 10/27/19 0148  AST 50*  ALT 76*  ALKPHOS 122  BILITOT 0.5  PROT 7.8  ALBUMIN 4.0   No results for input(s): LIPASE, AMYLASE in the last 168 hours. No results for input(s): AMMONIA in the last 168 hours. Coagulation Profile: No results for input(s): INR, PROTIME in the last 168 hours. Cardiac Enzymes: No results for input(s): CKTOTAL, CKMB, CKMBINDEX, TROPONINI in the last 168 hours. BNP (last 3 results) No results for input(s): PROBNP in the last 8760 hours. HbA1C: No results for input(s): HGBA1C in the last 72 hours. CBG: Recent Labs  Lab 10/30/19 0612 10/30/19 1152 10/30/19 1645 10/30/19 2056 10/31/19 0614  GLUCAP 141* 137* 103* 162* 94   Lipid Profile: No results for input(s): CHOL, HDL, LDLCALC, TRIG, CHOLHDL, LDLDIRECT in the last 72 hours. Thyroid Function Tests: No results for input(s): TSH, T4TOTAL, FREET4, T3FREE, THYROIDAB in the last 72 hours. Anemia Panel: No results for input(s): VITAMINB12, FOLATE, FERRITIN, TIBC, IRON, RETICCTPCT in the last 72 hours. Sepsis Labs: No results for input(s): PROCALCITON, LATICACIDVEN in the last 168 hours.  No results found for this or any previous visit (from the past 240 hour(s)).       Radiology Studies: No results found.      Scheduled Meds:  apixaban  5 mg Oral BID   atorvastatin  40 mg Oral QHS  digoxin  0.125 mg Oral Daily   folic acid  1 mg Oral Daily   furosemide  40 mg Oral Daily   insulin aspart  0-5 Units Subcutaneous QHS   insulin aspart  0-9 Units Subcutaneous TID WC   losartan  25 mg Oral Daily   multivitamin with minerals  1 tablet Oral Daily   spironolactone  25 mg Oral Daily   thiamine  100 mg Oral Daily   Continuous Infusions:   LOS: 19 days    Time spent:     Thalia Party, MD [facility] Pager [phone]  If  7PM-7AM, please contact night-coverage [email or web address] Password  10/31/2019, 8:19 AM

## 2019-10-31 NOTE — TOC Progression Note (Signed)
Transition of Care [facility]) - Progression Note    Patient Details  Name: Larone Kliethermes MRN: [id number] Date of Birth: 21-Aug-1965  Transition of Care [facility]) CM/SW [place], [place] Phone Number: 10/31/2019, 3:52 PM  Clinical Narrative:   CSW following for placement. No bed offers at this time. CSW has faxed out referral again to all pending referrals. CSW Supervisor has reached out to [employer] for assistance in finding bed availability for the patient. CSW to follow.    Expected Discharge Plan: [place] Barriers to Discharge: Continued Medical Work up, Active Substance Use - Placement, [place] (PASRR), Inadequate or no insurance, SNF Pending bed offer, Facility will not accept until restraint criteria met  Expected Discharge Plan and Services Expected Discharge Plan: [place] Choice: [place] Living arrangements for the past 2 months: [place] Determinants of Health (SDOH) Interventions    Readmission Risk Interventions Readmission Risk Prevention Plan 08/02/2018  PCP or Specialist Appt within 5-7 Days Complete  Home Care Screening Complete  Medication Review (RN CM) Complete  Some recent data might be hidden

## 2019-10-31 NOTE — Progress Notes (Signed)
Physical Therapy Treatment Patient Details Name: Kerry Hamilton MRN: [id number] DOB: September 18, 1965 Today's Date: 10/31/2019    History of Present Illness Pt is a 54 y.o. male admitted 10/11/19 with SOB and chest pain; chart states pt with ETOH and cocaine use for the past week. Workup for acute on chronic CHF secondary to nonischemic cardiomyopathy, suspect ETOH abuse. Worsening confusion noted 9/20-9/21; MRI with acute, moderate-sized infarct in R frontoparietal region. Further work-up ?Wet Beri beri from [place] abuse/thiamine deficiency-echo 5/20 EF 15%PMH includes substance abuse, DM2, CAD, depression.    PT Comments    Patient getting OOB alone with alarm sounding on arrival. Stating "lasix" and "bathroom" and assisted with RW to walk into bathroom. As exiting bathroom, attempted education re: proximity to RW (pt pushes too far ahead, with flexed posture) with pt unable to comprehend. Patient only focused on getting to his recliner and eating his lunch and could not re-direct him to engage in other PT activities. Will need to plan to see him when he is not so focused on eating his meal.     Follow Up Recommendations  SNF;Supervision/Assistance - 24 hour (vs long-term care)     Equipment Recommendations  Other (comment) (TBD)    Recommendations for Other Services       Precautions / Restrictions Precautions Precautions: Fall    Mobility  Bed Mobility Overal bed mobility: Needs Assistance Bed Mobility: Supine to Sit     Supine to sit: Supervision     General bed mobility comments: coming to sit at EOB on arrival with bed alarm sounding; supervision for safety  Transfers Overall transfer level: Needs assistance Equipment used: Rolling walker (2 wheeled) Transfers: Sit to/from Stand Sit to Stand: Min guard         General transfer comment: pt reaching for RW (has been using with nursing) and stating "lasix, bathroom" repeatedly as PT entered  Ambulation/Gait Ambulation/Gait  assistance: Min guard Gait Distance (Feet): 15 Feet Assistive device: Rolling walker (2 wheeled) Gait Pattern/deviations: Step-through pattern;Decreased stride length;Trunk flexed Gait velocity: Decreased   General Gait Details: pt motivated to go to bathroom and did not require facilitation or guidance; pt able to maneuver RW into and out of bathroom without assist; vc and demonstration for incr proximity to RW and pt continued to push it too far ahead   Praxair Mobility    Modified Rankin (Stroke Patients Only) Modified Rankin (Stroke Patients Only) Pre-Morbid Rankin Score: No symptoms Modified Rankin: Moderately severe disability     Balance Overall balance assessment: Needs assistance Sitting-balance support: No upper extremity supported;Feet supported Sitting balance-Leahy Scale: Good     Standing balance support: No upper extremity supported Standing balance-Leahy Scale: Fair                   Standardized Balance Assessment Standardized Balance Assessment :  (pt unable to follow instructions to complete  testing)          Cognition Arousal/Alertness: Awake/alert Behavior During Therapy: Anxious Overall Cognitive Status: No family/caregiver present to determine baseline cognitive functioning                                 General Comments: Very delayed responses; typically 1-3 words at most; frequently repeats what has been said to him; frequently requires manual facilitation/guidance to initiate mobility      Exercises  General Comments General comments (skin integrity, edema, etc.): As exiting bathroom, his lunch tray arrived. Pt began repeatedly saying "blood sugar no good...lunch" Patient automatically walking to recliner and could not distract him to walk further or work on standing activities at the sink. NT in to assess blood sugar (144). Patient only wanted his lunch and would not engage in PT  activities      Pertinent Vitals/Pain Pain Assessment: Faces Faces Pain Scale: No hurt Pain Descriptors / Indicators:  (pt unable; freq squinting eyes, shaking head)    Home Living                      Prior Function            PT Goals (current goals can now be found in the care plan section) Acute Rehab PT Goals Patient Stated Goal: not stated Time For Goal Achievement: 11/14/19 Potential to Achieve Goals: Fair Progress towards PT goals: Not progressing toward goals - comment (uanble to re-direct pt to task; goal update due to timeframe)    Frequency    Min 2X/week      PT Plan Current plan remains appropriate    Co-evaluation              AM-PAC PT "6 Clicks" Mobility   Outcome Measure  Help needed turning from your back to your side while in a flat bed without using bedrails?: A Little Help needed moving from lying on your back to sitting on the side of a flat bed without using bedrails?: None Help needed moving to and from a bed to a chair (including a wheelchair)?: A Little Help needed standing up from a chair using your arms (e.g., wheelchair or bedside chair)?: A Little Help needed to walk in hospital room?: A Little Help needed climbing 3-5 steps with a railing? : A Lot 6 Click Score: 18    End of Session Equipment Utilized During Treatment: Gait belt Activity Tolerance: Other (comment) (anxious and repeating "blood sugar no good...") Patient left: with call bell/phone within reach;in chair;with chair alarm set Nurse Communication: Mobility status;Other (comment) (up with chair alarm pad) PT Visit Diagnosis: Other abnormalities of gait and mobility (R26.89);Muscle weakness (generalized) (M62.81);Other symptoms and signs involving the nervous system (R29.898)     Time: 4709-6283 PT Time Calculation (min) (ACUTE ONLY): 15 min  Charges:  $Gait Training: 8-22 mins                      Kerry Hamilton, PT Pager [phone]    Zena Amos 10/31/2019, 12:55 PM

## 2019-10-31 NOTE — Plan of Care (Signed)
Pt is alert but confused. C/o dizziness and a HA at the beginning of shift.  Problem: Education: Goal: Knowledge of General Education information will improve Description: Including pain rating scale, medication(s)/side effects and non-pharmacologic comfort measures Outcome: Progressing   Problem: Health Behavior/Discharge Planning: Goal: Ability to manage health-related needs will improve Outcome: Progressing   Problem: Clinical Measurements: Goal: Ability to maintain clinical measurements within normal limits will improve Outcome: Progressing Goal: Will remain free from infection Outcome: Progressing Goal: Diagnostic test results will improve Outcome: Progressing Goal: Respiratory complications will improve Outcome: Progressing Goal: Cardiovascular complication will be avoided Outcome: Progressing   Problem: Activity: Goal: Risk for activity intolerance will decrease Outcome: Progressing   Problem: Nutrition: Goal: Adequate nutrition will be maintained Outcome: Progressing   Problem: Coping: Goal: Level of anxiety will decrease Outcome: Progressing   Problem: Elimination: Goal: Will not experience complications related to bowel motility Outcome: Progressing Goal: Will not experience complications related to urinary retention Outcome: Progressing   Problem: Pain Managment: Goal: General experience of comfort will improve Outcome: Progressing   Problem: Safety: Goal: Ability to remain free from injury will improve Outcome: Progressing   Problem: Skin Integrity: Goal: Risk for impaired skin integrity will decrease Outcome: Progressing   Problem: Education: Goal: Ability to demonstrate management of disease process will improve Outcome: Progressing Goal: Ability to verbalize understanding of medication therapies will improve Outcome: Progressing Goal: Individualized Educational Video(s) Outcome: Progressing   Problem: Activity: Goal: Capacity to carry out  activities will improve Outcome: Progressing   Problem: Cardiac: Goal: Ability to achieve and maintain adequate cardiopulmonary perfusion will improve Outcome: Progressing   Problem: Education: Goal: Knowledge of secondary prevention will improve Outcome: Progressing Goal: Knowledge of patient specific risk factors addressed and post discharge goals established will improve Outcome: Progressing Goal: Individualized Educational Video(s) Outcome: Progressing   Problem: Self-Care: Goal: Verbalization of feelings and concerns over difficulty with self-care will improve Outcome: Progressing Goal: Ability to communicate needs accurately will improve Outcome: Progressing   Problem: Education: Goal: Knowledge of disease or condition will improve Outcome: Progressing Goal: Knowledge of secondary prevention will improve Outcome: Progressing Goal: Knowledge of patient specific risk factors addressed and post discharge goals established will improve Outcome: Progressing Goal: Individualized Educational Video(s) Outcome: Progressing

## 2019-11-01 DIAGNOSIS — I5023 Acute on chronic systolic (congestive) heart failure: Secondary | ICD-10-CM | POA: Diagnosis not present

## 2019-11-01 LAB — RESPIRATORY PANEL BY RT PCR (FLU A&B, COVID)
Influenza A by PCR: NEGATIVE
Influenza B by PCR: NEGATIVE
SARS Coronavirus 2 by RT PCR: NEGATIVE

## 2019-11-01 LAB — BASIC METABOLIC PANEL
Anion gap: 10 (ref 5–15)
BUN: 26 mg/dL — ABNORMAL HIGH (ref 6–20)
CO2: 22 mmol/L (ref 22–32)
Calcium: 9.4 mg/dL (ref 8.9–10.3)
Chloride: 99 mmol/L (ref 98–111)
Creatinine, Ser: 1.22 mg/dL (ref 0.61–1.24)
GFR calc non Af Amer: >60 mL/min (ref >60)
Glucose, Bld: 76 mg/dL (ref 70–99)
Potassium: 4.7 mmol/L (ref 3.5–5.1)
Sodium: 131 mmol/L — ABNORMAL LOW (ref 135–145)

## 2019-11-01 LAB — GLUCOSE, CAPILLARY
Glucose-Capillary: 89 mg/dL (ref 70–99)
Glucose-Capillary: 102 mg/dL — ABNORMAL HIGH (ref 70–99)
Glucose-Capillary: 86 mg/dL (ref 70–99)
Glucose-Capillary: 105 mg/dL — ABNORMAL HIGH (ref 70–99)

## 2019-11-01 LAB — CBC
HCT: 48.7 % (ref 39.0–52.0)
Hemoglobin: 15.8 g/dL (ref 13.0–17.0)
MCH: 27.3 pg (ref 26.0–34.0)
MCHC: 32.4 g/dL (ref 30.0–36.0)
MCV: 84.1 fL (ref 80.0–100.0)
Platelets: 149 10*3/uL — ABNORMAL LOW (ref 150–400)
RBC: 5.79 MIL/uL (ref 4.22–5.81)
RDW: 12 % (ref 11.5–15.5)
WBC: 3.7 10*3/uL — ABNORMAL LOW (ref 4.0–10.5)
nRBC: 0 % (ref 0.0–0.2)

## 2019-11-01 MED ORDER — THIAMINE HCL 100 MG PO TABS: 100.0000 mg | ORAL_TABLET | Freq: Every day | ORAL | 0 refills | Status: AC

## 2019-11-01 MED ORDER — APIXABAN 5 MG PO TABS: 5.0000 mg | ORAL_TABLET | Freq: Two times a day (BID) | ORAL | 0 refills | Status: AC

## 2019-11-01 NOTE — NC FL2 (Signed)
[place] MEDICAID FL2 LEVEL OF CARE SCREENING TOOL     IDENTIFICATION  Patient Name: Kerry Hamilton Birthdate: 1965/02/18 Sex: male Admission Date (Current Location): 10/11/2019  [facility] and [place] Number:  Producer, television/film/video and Address:  [facility], [address] [place], [place] [address]      Provider Number: [id number]  Attending Physician Name and Address:  Thalia Party, DO  Relative Name and Phone Number:       Current Level of Care: Hospital Recommended Level of Care: Skilled Nursing Facility, Nursing Facility Prior Approval Number:    Date Approved/Denied:   PASRR Number: Manual review  Discharge Plan: SNF    Current Diagnoses: Patient Active Problem List   Diagnosis Date Noted  . Cerebral embolism with cerebral infarction 10/17/2019  . Schizoaffective disorder (HCC) 10/05/2018  . Acute CHF (congestive heart failure) (HCC) 08/01/2018  . Elevated liver enzymes 08/01/2018  . Acute on chronic systolic CHF (congestive heart failure) (HCC) 07/13/2018  . ARF (acute renal failure) (HCC) 07/13/2018  . Acute systolic CHF (congestive heart failure) (HCC)   . Elevated troponin   . Shortness of breath   . LFT elevation   . Prolonged QT interval   . Polysubstance abuse (HCC)   . ACS (acute coronary syndrome) (HCC) 06/10/2018  . Diabetes mellitus type 2, noninsulin dependent (HCC) 06/10/2018  . Depression 06/10/2018    Orientation RESPIRATION BLADDER Height & Weight      (disoriented)  Normal Continent Weight: 73.7 kg Height:  6' (182.9 cm)  BEHAVIORAL SYMPTOMS/MOOD NEUROLOGICAL BOWEL NUTRITION STATUS      Continent Diet (see DC summary)  AMBULATORY STATUS COMMUNICATION OF NEEDS Skin   Limited Assist Verbally Normal                       Personal Care Assistance Level of Assistance  Bathing, Feeding, Dressing Bathing Assistance: Limited assistance Feeding assistance: Independent Dressing Assistance: Limited assistance      Functional Limitations Info  Speech     Speech Info: Impaired (dysarthria)    SPECIAL CARE FACTORS FREQUENCY  PT (By licensed PT), OT (By licensed OT), Speech therapy     PT Frequency: 5x/wk OT Frequency: 5x/wk     Speech Therapy Frequency: 5x/wk      Contractures Contractures Info: Not present    Additional Factors Info  Code Status, Allergies, Insulin Sliding Scale Code Status Info: Full Allergies Info: NKA   Insulin Sliding Scale Info: 0-9 units 3x/day with meals; 0-5 units daily at bed       Current Medications (11/01/2019):  This is the current hospital active medication list Current Facility-Administered Medications  Medication Dose Route Frequency Provider Last Rate Last Admin  . acetaminophen (TYLENOL) tablet 650 mg  650 mg Oral Q4H PRN Madelyn Flavors A, MD   650 mg at 11/01/19 1046  . apixaban (ELIQUIS) tablet 5 mg  5 mg Oral BID Kc, Ramesh, MD   5 mg at 11/01/19 1041  . atorvastatin (LIPITOR) tablet 40 mg  40 mg Oral QHS Marvel Plan, MD   40 mg at 10/31/19 2158  . digoxin (LANOXIN) tablet 0.125 mg  0.125 mg Oral Daily Bensimhon, Bevelyn Buckles, MD   0.125 mg at 11/01/19 1040  . diphenhydrAMINE (BENADRYL) capsule 50 mg  50 mg Oral Q6H PRN Chotiner, Claudean Severance, MD   50 mg at 10/28/19 2030  . folic acid (FOLVITE) tablet 1 mg  1 mg Oral Daily Smith, Rondell  A, MD   1 mg at 11/01/19 1039  . furosemide (LASIX) tablet 40 mg  40 mg Oral Daily Leandro Reasoner Tublu, MD   40 mg at 11/01/19 1040  . HYDROcodone-acetaminophen (NORCO/VICODIN) 5-325 MG per tablet 1 tablet  1 tablet Oral Q6H PRN Lanae Boast, MD   1 tablet at 10/31/19 1404  . insulin aspart (novoLOG) injection 0-5 Units  0-5 Units Subcutaneous QHS Clydie Braun, MD   2 Units at 10/13/19 2255  . insulin aspart (novoLOG) injection 0-9 Units  0-9 Units Subcutaneous TID WC Madelyn Flavors A, MD   1 Units at 10/31/19 1237  . LORazepam (ATIVAN) tablet 0.5 mg  0.5 mg Oral Q8H PRN Lanae Boast, MD   0.5 mg at 10/26/19 2212   . losartan (COZAAR) tablet 25 mg  25 mg Oral Daily Bensimhon, Bevelyn Buckles, MD   25 mg at 11/01/19 1039  . multivitamin with minerals tablet 1 tablet  1 tablet Oral Daily Madelyn Flavors A, MD   1 tablet at 11/01/19 1038  . nicotine polacrilex (NICORETTE) gum 2 mg  2 mg Oral PRN Madelyn Flavors A, MD      . spironolactone (ALDACTONE) tablet 25 mg  25 mg Oral Daily Bensimhon, Bevelyn Buckles, MD   25 mg at 11/01/19 1040  . thiamine tablet 100 mg  100 mg Oral Daily Kc, Ramesh, MD   100 mg at 11/01/19 1040     Discharge Medications: Please see discharge summary for a list of discharge medications.  Relevant Imaging Results:  Relevant Lab Results:   Additional Information SS#: [id number]  Kermit Balo, RN

## 2019-11-01 NOTE — Progress Notes (Signed)
  Speech Language Pathology Treatment: Cognitive-Linquistic  Patient Details Name: Milas Schappell MRN: [id number] DOB: [birth year] Today's Date: 11/01/2019 Time: 1607-3710 SLP Time Calculation (min) (ACUTE ONLY): 15 min  Assessment / Plan / Recommendation Clinical Impression  Pt was seen for aphasia therapy with focus primarily on receptive language skills. Pt did not answer questions about his name/DOB until asked in the format of a sentence completion cue, at which time he could tell me accurate information. Most of the session focused on comprehension of (and attention to) simple, one-step commands, with pt requiring Max verbal, written, visual, and tactile cues to complete. Pt tends to make eye contact with speaker as opposed to looking at written targets, but when directed to the written cue with Max cues, he tries to read with similar dysfluencies to those noted in more spontaneous communication. He will continue to benefit from intensive SLP f/u.   HPI HPI: Pt is a 54 y.o. male admitted 10/11/19 with SOB and chest pain; chart states pt with ETOH and cocaine use for the past week. Workup for acute on chronic CHF secondary to nonischemic cardiomyopathy, suspect ETOH abuse. Worsening confusion noted 9/20-9/21; MRI with acute, moderate-sized infarct in R frontoparietal region. PMH includes substance abuse, DM2, CAD, depression.      SLP Plan  Continue with current plan of care       Recommendations                   Follow up Recommendations: Skilled Nursing facility;24 hour supervision/assistance SLP Visit Diagnosis: Aphasia (R47.01) Plan: Continue with current plan of care       GO                Mahala Menghini., M.A. CCC-SLP Acute Rehabilitation Services Pager [phone] Office [phone]  11/01/2019, 2:43 PM

## 2019-11-01 NOTE — TOC Progression Note (Addendum)
Transition of Care [facility]) - Progression Note    Patient Details  Name: Kerry Hamilton MRN: [id number] Date of Birth: 1965-04-28  Transition of Care [facility]) CM/SW Contact  Pollie Friar, RN Phone Number: 11/01/2019, 11:26 AM  Clinical Narrative:    CM has left voicemail for sister.  CM has asked [place] and [place] to review. CM contacted [facility] but they are remodeling and only have Covid + beds currently. TOC following.  1600: [facility] has offered a bed. CM has been unable to contact pts sister today to go over the d/c plan. Plan will be for him to d/c to [facility] in the am.    Expected Discharge Plan: [place] Barriers to Discharge: Continued Medical Work up, Active Substance Use - Placement, [place] (PASRR), Inadequate or no insurance, SNF Pending bed offer, Facility will not accept until restraint criteria met  Expected Discharge Plan and Services Expected Discharge Plan: [place] Choice: [place] Living arrangements for the past 2 months: [place] Determinants of Health (SDOH) Interventions    Readmission Risk Interventions Readmission Risk Prevention Plan 08/02/2018  PCP or Specialist Appt within 5-7 Days Complete  Home Care Screening Complete  Medication Review (RN CM) Complete  Some recent data might be hidden

## 2019-11-01 NOTE — Discharge Summary (Addendum)
Physician Discharge Summary  Kerry Hamilton:[id number] DOB: 04-08-1965 DOA: 10/11/2019  PCP: [facility]  Admit date: 10/11/2019 Discharge date: 11/01/2019  Admitted From: home Disposition: SNF  Recommendations for Outpatient Follow-up:  1. Follow up with PCP in 1-2 weeks 2. Please obtain BMP/CBC in one week 3. Please follow up on the following pending results:  Home Health: Equipment/Devices: Discharge Condition:stable CODE STATUS:Full Diet recommendation: Heart Healthy / Carb Modified / Regular / Dysphagia   Brief/Interim Summary: Per chart: 54 year old man with EF 15%, DM 2 was admitted yesterday with shortness of breath and chest pain. He had been drinking a lot and using crack for the last week PTA.Laboratory data was noted for decompensated heart failure and troponin around 25.Patient was treated with Lasix IV and seen by the advanced heart failure team. On day 3 after admission patient was noted to be talking to himself and somewhat confused. Patient had been on CIWA protocol and is thought to be in DTs. Ativan has been effective in controlling his agitation.  9/21:Continues to be confused, not following instruction moving upper extremities, CT head obtained that showed acute stroke and neurology was consulted and transferred to Neuro floor 3W. Neurology following, given his alcohol abuse letter cardiology question wet Kerry Hamilton s/p high-dose IV thiamine. Patient completed stroke work-up. 9/28: started on eliquis since 7-10 days post stroke as per Dr Pearlean Brownie given his low LVEF and advised to continue on treatment . Patient remains dysarthric, at times impulsive, on low-dose Ativan orally but overall improving following more commands and continues to require extensive rehabilitation moving forward 9/30: repeat ct head for possible headache:No acute bleeding demonstrated acutestroke.      Cardiology recommended no further work-up as the patient is not a  candidate for advanced therapies given his poor compliance and substance abuse. His lvef is 15-20% currently and  Managed with medical therapy with Lasix 40 mg, Lanoxin, losartan and Aldactone.not on beta-blocker due to low EF.Acute metabolic encephalopathy was most likely secondary to alcohol abuse, stroke. Patient is most probably at his baseline at this time.  Patient is oriented to place and person but not to time.For stroke ,Patient  started on Eliquis 5 mg daily. MRI showed limited moderate sized acute ischemic infarct in the right frontoparietal region. Carotid duplex was negative for acute finding MRI brain and neck negative for large vessel obstruction or stenosis.  PT/OT evaluated the patient and recommended skilled nursing facility.  Patient was of pending placement and today he got a bed in skilled nursing facility and will be discharged.  NOTE : Patient got a bad yesterday in a skilled nursing facility but could not be discharged because case management tried multiple times to contact her sister but her sister was not available. Case management was able to talk to her today and she agreed with the discharge plan to skilled nursing facility and we are discharging the patient today.  Discharge Diagnoses:  Principal Problem:   Acute on chronic systolic CHF (congestive heart failure) (HCC) Active Problems: Acute metabolic encephalopathy, improved   Diabetes mellitus type 2, noninsulin dependent (HCC)   Elevated troponin   Polysubstance abuse (HCC)   Cerebral embolism with cerebral infarction    Discharge Instructions  Discharge Instructions    Ambulatory referral to Neurology   Complete by: As directed    Follow up with stroke clinic NP (Kerry Hamilton or Kerry Hamilton, if both not available, consider Kerry Hamilton, or Kerry Hamilton) at [facility] in about 4 weeks. Thanks.  Allergies as of 11/01/2019   No Known Allergies     Medication List    TAKE these medications   apixaban 5  MG Tabs tablet Commonly known as: ELIQUIS Take 1 tablet (5 mg total) by mouth 2 (two) times daily.   carvedilol 3.125 MG tablet Commonly known as: COREG Take 1 tablet (3.125 mg total) by mouth 2 (two) times daily with a meal.   FLUoxetine 20 MG capsule Commonly known as: PROZAC Take 1 capsule (20 mg total) by mouth daily.   furosemide 40 MG tablet Commonly known as: LASIX Take 1 tablet (40 mg total) by mouth daily.   losartan 25 MG tablet Commonly known as: COZAAR Take 0.5 tablets (12.5 mg total) by mouth daily.   pantoprazole 40 MG tablet Commonly known as: PROTONIX TAKE 1 TABLET(40 MG) BY MOUTH DAILY   QUEtiapine 50 MG tablet Commonly known as: SEROQUEL Take 1 tablet (50 mg total) by mouth at bedtime.   spironolactone 25 MG tablet Commonly known as: ALDACTONE Take 0.5 tablets (12.5 mg total) by mouth daily.   thiamine 100 MG tablet Take 1 tablet (100 mg total) by mouth daily. Start taking on: November 02, 2019       Follow-up Information    [facility]. Schedule an appointment as soon as possible for a visit in 4 week(s).   Specialty: Neurology Contact information: [address] [place] [address] [phone]             No Known Allergies     Procedures/Studies: DG Chest 2 View  Result Date: 10/11/2019 CLINICAL DATA:  Chest pain EXAM: CHEST - 2 VIEW COMPARISON:  Radiograph 04/17/2019 FINDINGS: Some mild interstitial opacity and fissural thickening with slight central vascular congestion. No consolidation, pneumothorax, or effusion. Cardiomegaly is similar to prior counting for differences in the PA versus AP technique on today's examination. No acute osseous or soft tissue abnormality. IMPRESSION: 1. Findings suggest mild interstitial edema. 2. Cardiomegaly, similar to prior. Electronically Signed   By: Kreg Shropshire M.D.   On: 10/11/2019 17:52   CT HEAD WO CONTRAST  Result Date: 10/25/2019 CLINICAL DATA:   Headache, new or worsening; on Eliquis post stroke. Rule out bleed. Altered mental status. EXAM: CT HEAD WITHOUT CONTRAST TECHNIQUE: Contiguous axial images were obtained from the base of the skull through the vertex without intravenous contrast. COMPARISON:  Brain MRI 10/16/2019. head CT 10/16/2019. FINDINGS: Brain: Stable, mild generalized parenchymal atrophy. A known large subacute infarct affecting the right parietal lobe, temporoparietal junction and portions of the right occipital lobe is less conspicuous than on the head CT of 10/16/2019 (fogging phenomenon). There is subtle cortical hyperdensity in this region which may reflect cortical laminar necrosis and/or minimal petechial hemorrhage. No parenchymal hemorrhage is identified. Stable mild multifocal hypodensity within the cerebral white matter which is nonspecific, but consistent with chronic small vessel ischemic disease. No extra-axial fluid collection. No evidence of intracranial mass. No midline shift. Vascular: No hyperdense vessel.  Atherosclerotic calcifications. Skull: Redemonstrated chronic fracture deformity of the left zygomatic arch and left temporal bone. Sinuses/Orbits: Visualized orbits show no acute finding. Mild ethmoid sinus mucosal thickening. No significant mastoid effusion. IMPRESSION: A known large subacute infarct affecting the right parietal lobe, temporoparietal junction and portions of the right occipital lobe is less conspicuous than on the head CT of 10/16/2019 (fogging phenomenon). There is subtle cortical hyperdensity in this region which may reflect cortical laminar necrosis and/or minimal petechial hemorrhage. No parenchymal hemorrhage. No  significant mass effect. Stable background mild generalized parenchymal atrophy and cerebral white matter chronic small vessel ischemic disease. Electronically Signed   By: Jackey Loge DO   On: 10/25/2019 13:33   CT HEAD WO CONTRAST  Result Date: 10/16/2019 CLINICAL DATA:  Mental  status change. EXAM: CT HEAD WITHOUT CONTRAST TECHNIQUE: Contiguous axial images were obtained from the base of the skull through the vertex without intravenous contrast. COMPARISON:  10/04/2018 FINDINGS: Brain: Loss of gray-white differentiation and cytotoxic edema involving the posterior right frontal and parietal lobes (MCA territory). Mild mass effect, including sulcal effacement. No midline shift. Basal cisterns are patent. No mass occupying hemorrhagic transformation. No hydrocephalus. Partially empty sella. Vascular: Calcific intracranial atherosclerosis. Skull: Normal. Negative for fracture or focal lesion. Sinuses/Orbits: No acute finding. Other: No mastoid effusions. IMPRESSION: 1. Findings consistent with an acute/subacute right posterior MCA territory infarct. MRI could further characterize, if clinically indicated. 2. Mild local mass effect without midline shift. No mass occupying hemorrhagic transformation. These results will be called to the ordering clinician or representative by the Radiologist Assistant, and communication documented in the PACS or [employer]. Electronically Signed   By: Feliberto Harts MD   On: 10/16/2019 11:21   MR ANGIO HEAD WO CONTRAST  Result Date: 10/16/2019 CLINICAL DATA:  Initial evaluation for neuro deficit, stroke suspected. EXAM: MRI HEAD WITHOUT CONTRAST MRA HEAD WITHOUT CONTRAST TECHNIQUE: Multiplanar, multiecho pulse sequences of the brain and surrounding structures were obtained without intravenous contrast. Angiographic images of the head were obtained using MRA technique without contrast. COMPARISON:  Prior CT from earlier the same day. FINDINGS: MRI HEAD FINDINGS Brain: Examination is markedly limited as the patient was unable to tolerate the full length of the study. Axial and coronal DWI sequences only were performed. Diffusion-weighted sequence demonstrates a confluent area of restricted diffusion involving the posterior right frontoparietal region,  consistent with an acute posterior right MCA territory infarct. Mild involvement of the posterior right insula, with extension to involve the right periatrial white matter. No significant regional mass effect. No obvious evidence for associated hemorrhage, although evaluation for this is markedly limited on this exam. No other diffusion abnormality to suggest acute or subacute ischemia seen elsewhere within the brain. Gray-white matter differentiation otherwise maintained. No other mass effect or midline shift. No obvious mass lesion. Ventricles normal size without hydrocephalus. No visible extra-axial fluid collection. Vascular: Not assessed on this limited exam. Skull and upper cervical spine: Not assessed on this limited exam. Sinuses/Orbits: Not assessed on this limited exam. Other: None. MRA HEAD FINDINGS ANTERIOR CIRCULATION: Visualized distal cervical segments of the internal carotid arteries widely patent with symmetric antegrade flow. Petrous, cavernous, and supraclinoid segments widely patent without stenosis or other abnormality. A1 segments patent bilaterally. Normal anterior communicating artery complex. Anterior cerebral arteries widely patent to their distal aspects without stenosis. No M1 stenosis or occlusion. Left M1 bifurcates early. Bifurcations M cells are within normal limits. No visible proximal MCA branch occlusion. POSTERIOR CIRCULATION: Both vertebral arteries are widely patent to the vertebrobasilar junction without stenosis. Left vertebral artery slightly dominant. Neither PICA origin visualized. Basilar widely patent to its distal aspect without stenosis. Superior cerebral arteries patent bilaterally. Both PCAs primarily supplied via the basilar and are well perfused to their distal aspects. Small bilateral posterior communicating arteries noted. No intracranial aneurysm or other vascular abnormality. IMPRESSION: MRI HEAD IMPRESSION: 1. Limited exam due to the patient's inability to  tolerate the full length of the study. Diffusion-weighted imaging only was performed. 2.  Moderate-sized acute ischemic infarct involving the right frontoparietal region, posterior right MCA distribution. No associated mass effect. 3. No other definite acute intracranial abnormality. MRA HEAD IMPRESSION: Negative intracranial MRA. No large vessel occlusion or hemodynamically significant stenosis. No aneurysm. Electronically Signed   By: Rise Mu M.D.   On: 10/16/2019 19:02   MR BRAIN WO CONTRAST  Result Date: 10/16/2019 CLINICAL DATA:  Initial evaluation for neuro deficit, stroke suspected. EXAM: MRI HEAD WITHOUT CONTRAST MRA HEAD WITHOUT CONTRAST TECHNIQUE: Multiplanar, multiecho pulse sequences of the brain and surrounding structures were obtained without intravenous contrast. Angiographic images of the head were obtained using MRA technique without contrast. COMPARISON:  Prior CT from earlier the same day. FINDINGS: MRI HEAD FINDINGS Brain: Examination is markedly limited as the patient was unable to tolerate the full length of the study. Axial and coronal DWI sequences only were performed. Diffusion-weighted sequence demonstrates a confluent area of restricted diffusion involving the posterior right frontoparietal region, consistent with an acute posterior right MCA territory infarct. Mild involvement of the posterior right insula, with extension to involve the right periatrial white matter. No significant regional mass effect. No obvious evidence for associated hemorrhage, although evaluation for this is markedly limited on this exam. No other diffusion abnormality to suggest acute or subacute ischemia seen elsewhere within the brain. Gray-white matter differentiation otherwise maintained. No other mass effect or midline shift. No obvious mass lesion. Ventricles normal size without hydrocephalus. No visible extra-axial fluid collection. Vascular: Not assessed on this limited exam. Skull and upper  cervical spine: Not assessed on this limited exam. Sinuses/Orbits: Not assessed on this limited exam. Other: None. MRA HEAD FINDINGS ANTERIOR CIRCULATION: Visualized distal cervical segments of the internal carotid arteries widely patent with symmetric antegrade flow. Petrous, cavernous, and supraclinoid segments widely patent without stenosis or other abnormality. A1 segments patent bilaterally. Normal anterior communicating artery complex. Anterior cerebral arteries widely patent to their distal aspects without stenosis. No M1 stenosis or occlusion. Left M1 bifurcates early. Bifurcations M cells are within normal limits. No visible proximal MCA branch occlusion. POSTERIOR CIRCULATION: Both vertebral arteries are widely patent to the vertebrobasilar junction without stenosis. Left vertebral artery slightly dominant. Neither PICA origin visualized. Basilar widely patent to its distal aspect without stenosis. Superior cerebral arteries patent bilaterally. Both PCAs primarily supplied via the basilar and are well perfused to their distal aspects. Small bilateral posterior communicating arteries noted. No intracranial aneurysm or other vascular abnormality. IMPRESSION: MRI HEAD IMPRESSION: 1. Limited exam due to the patient's inability to tolerate the full length of the study. Diffusion-weighted imaging only was performed. 2. Moderate-sized acute ischemic infarct involving the right frontoparietal region, posterior right MCA distribution. No associated mass effect. 3. No other definite acute intracranial abnormality. MRA HEAD IMPRESSION: Negative intracranial MRA. No large vessel occlusion or hemodynamically significant stenosis. No aneurysm. Electronically Signed   By: Rise Mu M.D.   On: 10/16/2019 19:02   US Abdomen Complete  Result Date: 10/13/2019 CLINICAL DATA:  Elevated lipase EXAM: ABDOMEN ULTRASOUND COMPLETE COMPARISON:  08/01/2018 FINDINGS: Gallbladder: No gallstones or wall thickening  visualized. No sonographic Murphy sign noted by sonographer. Common bile duct: Diameter: 4 mm Liver: No focal lesion identified. Within normal limits in parenchymal echogenicity. Portal vein is patent on color Doppler imaging with normal direction of blood flow towards the liver. IVC: No abnormality visualized. Pancreas: Echogenic focus within the ventral aspect of the pancreatic head measures 0.5 x 0.3 x 0.7 cm this may reflect pancreatic calcification. The remainder of  the pancreas is unremarkable. Spleen: Size and appearance within normal limits. Right Kidney: Length: 11.2 cm. Echogenicity is within normal limits. There is a simple 1.2 cm cyst within the ventral midportion. No other renal abnormalities. Left Kidney: Length: 9.9 cm. Echogenicity within normal limits. No mass or hydronephrosis visualized. Abdominal aorta: No aneurysm visualized. Other findings: Trace right pleural effusion is incidentally noted. IMPRESSION: 1. Subcentimeter echogenic focus within the pancreatic head, favor calcification. 2. Trace right pleural effusion. 3. Simple right renal cyst. Electronically Signed   By: Sharlet Salina M.D.   On: 10/13/2019 01:36   ECHOCARDIOGRAM COMPLETE  Result Date: 10/12/2019    ECHOCARDIOGRAM REPORT   Patient Name:   Kerry Hamilton Date of Exam: 10/12/2019 Medical Rec #:  [id number]  Height:       72.0 in Accession #:    [id number] Weight:       175.0 lb Date of Birth:  [birth year]  BSA:          2.013 m Patient Age:    54 years   BP:           132/85 mmHg Patient Gender: M          HR:           68 bpm. Exam Location:  Inpatient Procedure: 2D Echo Indications:    chest pain 786.50  History:        Patient has prior history of Echocardiogram examinations, most                 recent 06/11/2018. CHF; Risk Factors:Diabetes and polysubstance                 abuse.  Sonographer:    Delcie Roch Referring Phys: [id number] RONDELL A SMITH IMPRESSIONS  1. Left ventricular ejection fraction, by estimation, is  15-20%. The left ventricle has severely decreased function. The left ventricle demonstrates global hypokinesis. The left ventricular internal cavity size was severely dilated. Left ventricular diastolic parameters are consistent with Grade III diastolic dysfunction (restrictive). Elevated left atrial pressure.  2. Right ventricular systolic function is severely reduced. The right ventricular size is severely enlarged. There is moderately elevated pulmonary artery systolic pressure. The estimated right ventricular systolic pressure is 54.8 mmHg.  3. Left atrial size was severely dilated.  4. Right atrial size was moderately dilated.  5. Severe mitral regurgitation. 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 The mitral valve is abnormal. Moderate to severe mitral valve regurgitation.  6. Tricuspid valve regurgitation is moderate to severe.  7. The aortic valve is tricuspid. Aortic valve regurgitation is not visualized. No aortic stenosis is present.  8. The inferior vena cava is normal in size with <50% respiratory variability, suggesting right atrial pressure of 8 mmHg. Comparison(s): No significant change from prior study. FINDINGS  Left Ventricle: Left ventricular ejection fraction, by estimation, is 15-20%. The left ventricle has severely decreased function. The left ventricle demonstrates global hypokinesis. The left ventricular internal cavity size was severely dilated. There is no left ventricular hypertrophy. Left ventricular diastolic parameters are consistent with Grade III diastolic dysfunction (restrictive). Elevated left atrial pressure. Right Ventricle: The right ventricular size is severely enlarged. No increase in right ventricular wall thickness. Right ventricular systolic function is severely reduced. There is moderately elevated pulmonary artery systolic pressure. The tricuspid regurgitant velocity is 3.42 m/s, and with an assumed right atrial pressure of 8 mmHg, the estimated  right ventricular systolic pressure is 54.8 mmHg. Left Atrium: Left atrial  size was severely dilated. Right Atrium: Right atrial size was moderately dilated. Pericardium: Trivial pericardial effusion is present. Mitral Valve: Severe mitral regurgitation. 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 The mitral valve is abnormal. Moderate to severe mitral valve regurgitation. Tricuspid Valve: The tricuspid valve is grossly normal. Tricuspid valve regurgitation is moderate to severe. No evidence of tricuspid stenosis. Aortic Valve: The aortic valve is tricuspid. Aortic valve regurgitation is not visualized. No aortic stenosis is present. Pulmonic Valve: The pulmonic valve was grossly normal. Pulmonic valve regurgitation is trivial. No evidence of pulmonic stenosis. Aorta: The aortic root and ascending aorta are structurally normal, with no evidence of dilitation. Venous: The inferior vena cava is normal in size with less than 50% respiratory variability, suggesting right atrial pressure of 8 mmHg. IAS/Shunts: The atrial septum is grossly normal.  LEFT VENTRICLE PLAX 2D LVIDd:         7.10 cm LVIDs:         6.50 cm LV PW:         1.20 cm LV IVS:        0.90 cm LVOT diam:     2.30 cm LV SV:         25 LV SV Index:   12 LVOT Area:     4.15 cm  LV Volumes (MOD) LV vol d, MOD A4C: 225.0 ml LV vol s, MOD A4C: 185.0 ml LV SV MOD A4C:     225.0 ml RIGHT VENTRICLE            IVC RV S prime:     6.98 cm/s  IVC diam: 2.00 cm TAPSE (M-mode): 1.2 cm LEFT ATRIUM              Index       RIGHT ATRIUM           Index LA diam:        4.80 cm  2.38 cm/m  RA Area:     17.40 cm LA Vol (A2C):   130.0 ml 64.58 ml/m RA Volume:   51.10 ml  25.38 ml/m LA Vol (A4C):   96.0 ml  47.69 ml/m LA Biplane Vol: 115.0 ml 57.13 ml/m  AORTIC VALVE LVOT Vmax:   41.10 cm/s LVOT Vmean:  26.600 cm/s LVOT VTI:    0.060 m  AORTA Ao Root diam: 3.00 cm Ao Asc diam:  3.00 cm MITRAL VALVE               TRICUSPID VALVE MV Area (PHT): 4.39  cm    TR Peak grad:   46.8 mmHg MV Decel Time: 173 msec    TR Vmax:        342.00 cm/s MR Peak grad: 104.0 mmHg MR Mean grad: 66.0 mmHg    SHUNTS MR Vmax:      510.00 cm/s  Systemic VTI:  0.06 m MR Vmean:     380.0 cm/s   Systemic Diam: 2.30 cm MV E velocity: 81.00 cm/s MV A velocity: 24.00 cm/s MV E/A ratio:  3.38 Lennie Odor MD Electronically signed by Lennie Odor MD Signature Date/Time: 10/12/2019/5:36:12 PM    Final    VAS US CAROTID (at [facility] and [facility] only)  Result Date: 10/17/2019 Carotid Arterial Duplex Study Indications:       CVA. Risk Factors:      Current smoker. Comparison Study:  No prior studies. Performing Technologist: Jean Rosenthal  Examination Guidelines: A complete evaluation includes B-mode imaging, spectral Doppler, color Doppler,  and power Doppler as needed of all accessible portions of each vessel. Bilateral testing is considered an integral part of a complete examination. Limited examinations for reoccurring indications may be performed as noted.  Right Carotid Findings: +----------+--------+--------+--------+------------------+------------------+           PSV cm/sEDV cm/sStenosisPlaque DescriptionComments           +----------+--------+--------+--------+------------------+------------------+ CCA Prox  146     26                                                   +----------+--------+--------+--------+------------------+------------------+ CCA Distal148     25                                intimal thickening +----------+--------+--------+--------+------------------+------------------+ ICA Prox  152     18      1-39%   heterogenous                         +----------+--------+--------+--------+------------------+------------------+ ICA Distal85      23                                                   +----------+--------+--------+--------+------------------+------------------+ ECA       139     19                                                    +----------+--------+--------+--------+------------------+------------------+ +----------+--------+-------+----------------+-------------------+           PSV cm/sEDV cmsDescribe        Arm Pressure (mmHG) +----------+--------+-------+----------------+-------------------+ [id number]            Multiphasic, WNL                    +----------+--------+-------+----------------+-------------------+ +---------+--------+--+--------+--+---------+ VertebralPSV cm/s53EDV cm/s12Antegrade +---------+--------+--+--------+--+---------+  Left Carotid Findings: +----------+--------+--------+--------+------------------+------------------+           PSV cm/sEDV cm/sStenosisPlaque DescriptionComments           +----------+--------+--------+--------+------------------+------------------+ CCA Prox  169     28                                intimal thickening +----------+--------+--------+--------+------------------+------------------+ CCA Distal122     21                                intimal thickening +----------+--------+--------+--------+------------------+------------------+ ICA Prox  48      16      1-39%   heterogenous                         +----------+--------+--------+--------+------------------+------------------+ ICA Distal112     29                                                   +----------+--------+--------+--------+------------------+------------------+  ECA       143     18                                                   +----------+--------+--------+--------+------------------+------------------+ +----------+--------+--------+----------------+-------------------+           PSV cm/sEDV cm/sDescribe        Arm Pressure (mmHG) +----------+--------+--------+----------------+-------------------+ [id number]             Multiphasic, WNL                    +----------+--------+--------+----------------+-------------------+  +---------+--------+--+--------+--+---------+ VertebralPSV cm/s63EDV cm/s11Antegrade +---------+--------+--+--------+--+---------+   Summary: Right Carotid: Velocities in the right ICA are consistent with a 1-39% stenosis. Left Carotid: Velocities in the left ICA are consistent with a 1-39% stenosis. Vertebrals:  Bilateral vertebral arteries demonstrate antegrade flow. Subclavians: Normal flow hemodynamics were seen in bilateral subclavian              arteries. *See table(s) above for measurements and observations.  Electronically signed by Coral Else MD on 10/17/2019 at 9:32:25 PM.    Final     (Echo, Carotid, EGD, Colonoscopy, ERCP)    Subjective: Seen and evaluated at the bedside today.  Patient was more calm comfortable and oriented today.  Patient was able to answer most of the questions appropriately.  Although he is still having some speech difficulty but his condition is improved.  Patient denies fever, chills, chest pain, shortness of breath, nausea, vomiting, abdominal pain and urinary symptoms.   Discharge Exam: Vitals:   11/01/19 0759 11/01/19 1140  BP: 115/77 93/68  Pulse: 74 78  Resp: 20 20  Temp: (!) 97.4 F (36.3 C) 97.6 F (36.4 C)  SpO2: 97% 99%   Vitals:   11/01/19 0443 11/01/19 0500 11/01/19 0759 11/01/19 1140  BP: 110/73  115/77 93/68  Pulse: 60  74 78  Resp: 18  20 20   Temp: (!) 97.5 F (36.4 C)  (!) 97.4 F (36.3 C) 97.6 F (36.4 C)  TempSrc: Oral  Oral Oral  SpO2: 99%  97% 99%  Weight:  73.7 kg    Height:        General: Pt is alert, awake, not in acute distress Cardiovascular: RRR, S1/S2 +, no rubs, no gallops Respiratory: CTA bilaterally, no wheezing, no rhonchi Abdominal: Soft, NT, ND, bowel sounds + Extremities: no edema, no cyanosis Neurology: Patient is having mild dysarthria but otherwise his condition is much improved.  No focal neurological deficits.  Patient is most probably at his baseline.  Cranial nerves II to XII grossly  intact.   The results of significant diagnostics from this hospitalization (including imaging, microbiology, ancillary and laboratory) are listed below for reference.     Microbiology: No results found for this or any previous visit (from the past 240 hour(s)).   Labs: BNP (last 3 results) Recent Labs    11/15/18 1418 04/17/19 1758 10/11/19 1645  BNP 292.9* 1,169.4* 2,015.6*   Basic Metabolic Panel: Recent Labs  Lab 10/27/19 0148 10/29/19 1004 10/30/19 0211 11/01/19 0452  NA 131* 131* 130* 131*  K 4.4 4.7 4.8 4.7  CL 97* 98 98 99  CO2 23 22 20* 22  GLUCOSE 100* 160* 83 76  BUN 30* 27* 31* 26*  CREATININE 1.27* 1.31* 1.16 1.22  CALCIUM 9.5 9.5 9.5 9.4   Liver  Function Tests: Recent Labs  Lab 10/27/19 0148  AST 50*  ALT 76*  ALKPHOS 122  BILITOT 0.5  PROT 7.8  ALBUMIN 4.0   No results for input(s): LIPASE, AMYLASE in the last 168 hours. No results for input(s): AMMONIA in the last 168 hours. CBC: Recent Labs  Lab 10/27/19 0148 10/29/19 1004 10/30/19 0211 11/01/19 0452  WBC 3.9* 2.9* 3.5* 3.7*  HGB 16.6 16.0 15.6 15.8  HCT 50.9 49.0 49.3 48.7  MCV 84.8 84.2 84.3 84.1  PLT 199 181 161 149*   Cardiac Enzymes: No results for input(s): CKTOTAL, CKMB, CKMBINDEX, TROPONINI in the last 168 hours. BNP: Invalid input(s): POCBNP CBG: Recent Labs  Lab 10/31/19 1123 10/31/19 1708 10/31/19 2129 11/01/19 0619 11/01/19 1146  GLUCAP 145* 116* 102* 89 102*   D-Dimer No results for input(s): DDIMER in the last 72 hours. Hgb A1c No results for input(s): HGBA1C in the last 72 hours. Lipid Profile No results for input(s): CHOL, HDL, LDLCALC, TRIG, CHOLHDL, LDLDIRECT in the last 72 hours. Thyroid function studies No results for input(s): TSH, T4TOTAL, T3FREE, THYROIDAB in the last 72 hours.  Invalid input(s): FREET3 Anemia work up No results for input(s): VITAMINB12, FOLATE, FERRITIN, TIBC, IRON, RETICCTPCT in the last 72 hours. Urinalysis    Component  Value Date/Time   COLORURINE YELLOW 06/10/2018 1614   APPEARANCEUR CLEAR 06/10/2018 1614   LABSPEC 1.032 (H) 06/10/2018 1614   PHURINE 5.0 06/10/2018 1614   GLUCOSEU NEGATIVE 06/10/2018 1614   HGBUR NEGATIVE 06/10/2018 1614   BILIRUBINUR NEGATIVE 06/10/2018 1614   KETONESUR NEGATIVE 06/10/2018 1614   PROTEINUR 100 (A) 06/10/2018 1614   NITRITE NEGATIVE 06/10/2018 1614   LEUKOCYTESUR NEGATIVE 06/10/2018 1614   Sepsis Labs Invalid input(s): PROCALCITONIN,  WBC,  LACTICIDVEN Microbiology No results found for this or any previous visit (from the past 240 hour(s)).   Time coordinating discharge: Over 30 minutes  SIGNED:   Thalia Party, MD  [facility] 11/01/2019, 2:58 PM Pager   If 7PM-7AM, please contact night-coverage [email or web address] Password

## 2019-11-02 LAB — GLUCOSE, CAPILLARY: Glucose-Capillary: 85 mg/dL (ref 70–99)

## 2019-11-02 NOTE — TOC Transition Note (Signed)
Transition of Care [facility]) - CM/SW Discharge Note   Patient Details  Name: Kerry Hamilton MRN: [id number] Date of Birth: 06-02-1965  [facility] CM/SW Contact:  Kermit Balo, RN Phone Number: 11/02/2019, 8:45 AM   Clinical Narrative:    Pt is discharging to [facility] today. Pts sister is to be at [name] this am to sign the paperwork.  Pt will transport via [facility]. Bedside RN updated and d/c packet at the desk.  Room: 215B Number for report: [phone]   Final next level of care: Skilled Nursing Facility Barriers to Discharge: No Barriers Identified   Patient Goals and CMS Choice Patient states their goals for this hospitalization and ongoing recovery are:: patient unable to participate in goal setting due to disorientation CMS Medicare.gov Compare Post Acute Care list provided to:: Patient Represenative (must comment) Choice offered to / list presented to : Sibling  Discharge Placement              Patient chooses bed at:  [facility]) Patient to be transferred to facility by: [facility] Name of family member notified: Gershon Cull Patient and family notified of of transfer: 11/02/19  Discharge Plan and Services     Post Acute Care Choice: Skilled Nursing Facility                               Social Determinants of Health (SDOH) Interventions     Readmission Risk Interventions Readmission Risk Prevention Plan 08/02/2018  PCP or Specialist Appt within 5-7 Days Complete  Home Care Screening Complete  Medication Review (RN CM) Complete  Some recent data might be hidden

## 2019-12-06 ENCOUNTER — Ambulatory Visit: Payer: Medicaid Other | Admitting: Adult Health

## 2019-12-06 ENCOUNTER — Encounter: Payer: Self-pay | Admitting: Adult Health

## 2019-12-06 VITALS — BP 143/81 | HR 62 | Ht 72.0 in | Wt 163.0 lb

## 2019-12-06 DIAGNOSIS — I10 Essential (primary) hypertension: Secondary | ICD-10-CM | POA: Diagnosis not present

## 2019-12-06 DIAGNOSIS — E119 Type 2 diabetes mellitus without complications: Secondary | ICD-10-CM

## 2019-12-06 DIAGNOSIS — I63411 Cerebral infarction due to embolism of right middle cerebral artery: Secondary | ICD-10-CM

## 2019-12-06 DIAGNOSIS — I5023 Acute on chronic systolic (congestive) heart failure: Secondary | ICD-10-CM | POA: Diagnosis not present

## 2019-12-06 DIAGNOSIS — E785 Hyperlipidemia, unspecified: Secondary | ICD-10-CM

## 2019-12-06 NOTE — Patient Instructions (Signed)
Continue to work with therapies for hopeful ongoing recovery  Advanced HF clinic - needs to schedule f/u visit [phone]  Continue Eliquis (apixaban) daily  and atorvastatin  for secondary stroke prevention  Continue to follow up with PCP regarding cholesterol, blood pressure and diabetes management  Maintain strict control of hypertension with blood pressure goal below 130/90, diabetes with hemoglobin A1c goal below 7% and cholesterol with LDL cholesterol (bad cholesterol) goal below 70 mg/dL.       Followup in the future with me in 4 months or call earlier if needed       Thank you for coming to see [place] at [facility]. I hope we have been able to provide you high quality care today.  You may receive a patient satisfaction survey over the next few weeks. We would appreciate your feedback and comments so that we may continue to improve ourselves and the health of our patients.

## 2019-12-06 NOTE — Progress Notes (Signed)
[facility] [address] [place]. [place] [address] [phone]       HOSPITAL FOLLOW UP NOTE  [name] Date of Birth:  [birth year] Medical Record Number:  [id number]   Reason for Referral:  hospital stroke follow up    SUBJECTIVE:   CHIEF COMPLAINT:  Chief Complaint  Patient presents with  . Hospitalization Follow-up    tx rm, with sister, pt c/o of dizziness, in wheelchair     HPI:   [name] is a 54 y.o. male with history of depression, DM2, CHF with EF of 15%, EtOH use, cocaine use who is admitted 10/12/2019 with SOB and chest pain. On Ativan PRN per CIWA protocol. On 10/16/2019 noted to have difficulty with language (garbled speech and not following commands).  Personally reviewed pertinent hospitalization progress notes, lab work and imaging with summary provided.  Evaluated by Dr. Roda Shutters with stroke work-up revealing right MCA infarct embolic most likely secondary to cardiomyopathy with low EF 15 to 20% in setting of EtOH and cocaine use.  Recommend initiate anticoagulation 7 to 10 days post stroke given severe cardiomyopathy with stroke and Eliquis initiated on 9/28.  Advise AC can be discontinued once EF > 30 to 35%.  Once off AC, recommend 30-day cardiac event monitor for loop recorder to rule out A. fib.  Discussed EtOH, tobacco and cocaine cessation with counseling provided.  LDL 102 and initiate atorvastatin 40 mg daily.  Elevated ALT and advised monitor LFTs outpatient.  Controlled DM with A1c 6.3.  Evaluated by therapy and recommended discharge to CIR but eventually discharged to SNF for extensive rehab for residual aphasia, cognitive impairment, dizziness and gait impairment.  Stroke:   R MCA infarct embolic most likely secondary to cardiomyopathy w/ low EF in setting of alcohol and cocaine use  CT head acute/subacute R poster MCA infarct w/ mild local mass effect.  MRI  Moderate R frontoparietal, posterior R MCA infarct  MRA  Unremarkable    Carotid Doppler  B ICA 1-39% stenosis, VAs antegrade   2D Echo EF 15-20%. No source of embolus. LA severely dilated  LDL 102  HgbA1c 6.3  UDS neg  VTE prophylaxis - Lovenox 40 mg sq daily   No antithrombotic prior to admission, now on aspirin 81 mg daily. From neuro standpoint, recommend anticoagulation in 7-10 days post stroke (9/27-9/30) given severe cardiomyopathy with stroke.  Therapy recommendations:  CIR -->SNF  Disposition:  SNF    Today, 12/06/2019, [name] is being seen for hospital follow-up accompanied by his sister. He continues to reside at [facility] currently receiving PT/OT/SLP. He reports improvement since discharge. He continues to complain of dizziness, imbalance, headache, and speech difficulty. Dizziness and headaches occur with standing and ambulation which was reported during inpatient therapy sessions. He tranfers via w/c due to dizziness but ambulates with RW with therapy. Denies new or worseing stroke/TIA symptoms. Remains on eliquis without bleeding or bruising. Remains on atrovastatin without myalgias. Blood pressure today 143/81.  Obviously abstinence from EtOH, tobacco and cocaine.  No further concerns at this time.    ROS:   14 system review of systems performed and negative with exception of those listed in HPI  PMH:  Past Medical History:  Diagnosis Date  . Cocaine abuse (HCC)   . Depression   . Diabetes mellitus without complication (HCC)   . Systolic congestive heart failure (HCC)   . Tobacco abuse     PSH:  Past Surgical History:  Procedure  Laterality Date  . RIGHT/LEFT HEART CATH AND CORONARY ANGIOGRAPHY N/A 06/13/2018   Procedure: RIGHT/LEFT HEART CATH AND CORONARY ANGIOGRAPHY;  Surgeon: [place], Peter M, MD;  Location: [facility] INVASIVE CV LAB;  Service: Cardiovascular;  Laterality: N/A;    Social History:  Social History   Socioeconomic History  . Marital status: Single    Spouse name: Not on file  . Number of children:  Not on file  . Years of education: Not on file  . Highest education level: Not on file  Occupational History  . Not on file  Tobacco Use  . Smoking status: Current Every Day Smoker    Packs/day: 1.00    Types: Cigarettes, Cigars  . Smokeless tobacco: Never Used  Vaping Use  . Vaping Use: Never used  Substance and Sexual Activity  . Alcohol use: Yes    Alcohol/week: 6.0 standard drinks    Types: 6 Cans of beer per week    Comment: daily  . Drug use: Yes    Types: "Crack" cocaine  . Sexual activity: Not Currently  Other Topics Concern  . Not on file  Social History Narrative  . Not on file   Social Determinants of Health   Financial Resource Strain:   . Difficulty of Paying Living Expenses: Not on file  Food Insecurity:   . Worried About Programme researcher, broadcasting/film/video in the Last Year: Not on file  . Ran Out of Food in the Last Year: Not on file  Transportation Needs:   . Lack of Transportation (Medical): Not on file  . Lack of Transportation (Non-Medical): Not on file  Physical Activity:   . Days of Exercise per Week: Not on file  . Minutes of Exercise per Session: Not on file  Stress:   . Feeling of Stress : Not on file  Social Connections:   . Frequency of Communication with Friends and Family: Not on file  . Frequency of Social Gatherings with Friends and Family: Not on file  . Attends Religious Services: Not on file  . Active Member of Clubs or Organizations: Not on file  . Attends Banker Meetings: Not on file  . Marital Status: Not on file  Intimate Partner Violence:   . Fear of Current or Ex-Partner: Not on file  . Emotionally Abused: Not on file  . Physically Abused: Not on file  . Sexually Abused: Not on file    Family History:  Family History  Problem Relation Age of Onset  . CAD Paternal Grandmother   . Diabetes Mellitus II Neg Hx     Medications:   Current Outpatient Medications on File Prior to Visit  Medication Sig Dispense Refill  .  apixaban (ELIQUIS) 5 MG TABS tablet Take 1 tablet (5 mg total) by mouth 2 (two) times daily. 60 tablet 0  . FLUoxetine (PROZAC) 20 MG capsule Take 1 capsule (20 mg total) by mouth daily. 30 capsule 0  . furosemide (LASIX) 40 MG tablet Take 1 tablet (40 mg total) by mouth daily. 30 tablet 2  . meclizine (ANTIVERT) 12.5 MG tablet Take 12.5 mg by mouth 3 (three) times daily.    . metFORMIN (GLUCOPHAGE) 500 MG tablet Take by mouth 2 (two) times daily with a meal.    . pantoprazole (PROTONIX) 40 MG tablet TAKE 1 TABLET(40 MG) BY MOUTH DAILY 90 tablet 0  . QUEtiapine (SEROQUEL) 50 MG tablet Take 1 tablet (50 mg total) by mouth at bedtime. 30 tablet 0  . thiamine  100 MG tablet Take 1 tablet (100 mg total) by mouth daily. 30 tablet 0  . carvedilol (COREG) 3.125 MG tablet Take 1 tablet (3.125 mg total) by mouth 2 (two) times daily with a meal. (Patient not taking: Reported on 10/12/2019) 60 tablet 2  . losartan (COZAAR) 25 MG tablet Take 0.5 tablets (12.5 mg total) by mouth daily. 15 tablet 2  . spironolactone (ALDACTONE) 25 MG tablet Take 0.5 tablets (12.5 mg total) by mouth daily. 45 tablet 3   No current facility-administered medications on file prior to visit.    Allergies:  No Known Allergies    OBJECTIVE:  Physical Exam  Vitals:   12/06/19 1351  BP: (!) 143/81  Pulse: 62  Weight: 163 lb (73.9 kg)  Height: 6' (1.829 m)   Body mass index is 22.11 kg/m. No exam data present  General: well developed, well nourished,  very [name], seated, in no evident distress Head: head normocephalic and atraumatic.   Neck: supple with no carotid or supraclavicular bruits Cardiovascular: regular rate and rhythm, no murmurs Musculoskeletal: no deformity Skin:  no rash/petichiae Vascular:  Normal pulses all extremities   Neurologic Exam Mental Status: Awake and fully alert.   Moderate expressive> mild receptive aphasia.  Mild dysarthria.  Able to follow simple  step commands.  Oriented to place and time. Recent and remote memory appears intact. Attention span, concentration and fund of knowledge appropriate during visit. Mood and affect appropriate and cooperative with exam.  Cranial Nerves: Fundoscopic exam reveals sharp disc margins. Pupils equal, briskly reactive to light. Extraocular movements full without nystagmus.  Not consistent with blinking to visual threat bilaterally. Hearing intact. Facial sensation intact. Face, tongue, palate moves normally and symmetrically.  Motor: Normal bulk and tone. Normal strength in all tested extremity muscles. Sensory.:  Difficulty assessing due to language deficit but responds appropriately to painful stimuli Coordination: Rapid alternating movements normal in all extremities. Finger-to-nose and heel-to-shin performed accurately bilaterally. Gait and Station: Deferred as RW not present visit Reflexes: 1+ and symmetric. Toes downgoing.     NIHSS  3 Modified Rankin  3-4      ASSESSMENT: Kerry Hamilton is a 54 y.o. year old male presented initially on 10/12/2019 with SOB an chest pain developing difficulty with language and not following commands on 10/16/2019 with stroke work-up revealing right MCA infarct embolic most likely secondary to cardiomyopathy with low EF 15 to 20% in setting of EtOH and cocaine use. Vascular risk factors include substance abuse, tobacco use, medication noncompliance, DM, HTN, HLD, and CHF with a EF 15 to 20%.      PLAN:  1. R MCA stroke :  a. Residual deficit: Expressive> receptive aphasia, dysarthria, imbalance and dizziness and headache upon standing.  Advised continued participation with SNF therapies for hopeful ongoing improvement b. Continue Eliquis (apixaban) daily  and atorvastatin for secondary stroke prevention. Close PCP follow up for aggressive stroke risk factor management  2. CHF 2/2 cardiomyopathy: a. Due to alcohol and cocaine use b. Continuation of Eliquis in  setting of recent stroke c. Per Dr. Pearlean Brownie, recommend continuation of Eliquis until EF > 30%.  If Eliquis discontinued in the future, Dr. Pearlean Brownie recommends cardiac monitor or loop recorder to assess for atrial fibrillation d. Provided sister with advanced HF clinic office number to schedule recommended follow-up visit 3. Substance abuse: Reports complete abstinence currently residing in SNF.  Discussed importance of continued abstinence in setting of CHF with cardiomyopathy and recent stroke 4. HTN:  BP goal <130/90.  Stable on carvedilol and losartan per PCP/cardiology 5. HLD: LDL goal <70. Recent LDL 103.  Continue atorvastatin managed and monitored by facility 6. DMII: A1c goal<7.0. Recent A1c 6.3. On Metformin per PCP    Follow up in 4 months or call earlier if needed   I spent 45 minutes of face-to-face and non-face-to-face time with patient and sister.  This included previsit chart review including recent hospitalization pertinent progress notes, lab work and imaging, lab review, study review, order entry, electronic health record documentation, patient education regarding recent stroke and etiology, residual deficits, importance of managing stroke risk factors and answered all questions to patient and sisters satisfaction   Ihor Austin, [facility]  [facility] [address] [place], [place] [address]  Phone [phone] Fax [phone] Note: This document was prepared with digital dictation and possible smart phrase technology. Any transcriptional errors that result from this process are unintentional.

## 2019-12-07 NOTE — Progress Notes (Signed)
I agree with the above plan 

## 2020-04-10 ENCOUNTER — Ambulatory Visit: Payer: Medicaid Other | Admitting: Adult Health

## 2020-04-10 ENCOUNTER — Other Ambulatory Visit: Payer: Self-pay

## 2020-04-10 ENCOUNTER — Encounter: Payer: Self-pay | Admitting: Adult Health

## 2020-04-10 VITALS — BP 150/86 | HR 67 | Ht 72.0 in

## 2020-04-10 DIAGNOSIS — I63411 Cerebral infarction due to embolism of right middle cerebral artery: Secondary | ICD-10-CM | POA: Diagnosis not present

## 2020-04-10 NOTE — Patient Instructions (Signed)
Continue working with speech therapy and possibly restart physical therapy once dizziness improves  Continue Eliquis (apixaban) daily  and atorvastatin for secondary stroke prevention  Needs to schedule follow-up visit with cardiology and CHF clinic Question if dizziness with standing possibly related to blood pressure as it quickly subsides -this can be further discussed with SNF provider or cardiology  Use of Tylenol as needed for recent onset lower back pain as well as over-the-counter topical ointments  Continue to follow up with PCP regarding cholesterol and blood pressure management  Maintain strict control of hypertension with blood pressure goal below 130/90 and cholesterol with LDL cholesterol (bad cholesterol) goal below 70 mg/dL.       Followup in the future with me in 6 months or call earlier if needed       Thank you for coming to see [place] at [facility]. I hope we have been able to provide you high quality care today.  You may receive a patient satisfaction survey over the next few weeks. We would appreciate your feedback and comments so that we may continue to improve ourselves and the health of our patients.

## 2020-04-10 NOTE — Progress Notes (Signed)
[facility] [address] [place]. [place] [address] [phone]       STROKE FOLLOW UP NOTE  [name] Date of Birth:  [birth year] Medical Record Number:  [id number]   Reason for Referral:  stroke follow up    SUBJECTIVE:   CHIEF COMPLAINT:  Chief Complaint  Patient presents with  . Follow-up    RM 14 with sister Randa Evens) PT is having some dizziness and gait abnormality     HPI:   Today, 04/10/2020, Kerry Hamilton returns for 22-month stroke follow-up accompanied by his sister  Continues to reside at [facility]  Reports residual dizziness and speech difficulty Continues to work with speech therapy reporting continued improvement Dizziness typically occurs upon standing from a seated position and quickly subsides - due to dizziness, he limits ambulation and mainly transfers via wheelchair Denies new stroke/TIA symptoms  Compliant on Eliquis and atorvastatin -denies side effects Blood pressure today 150/86  He has not had follow-up with cardiology since hospital discharge  No new concerns at this time     History provided for reference purposes only Initial visit 12/06/2019 JM: Kerry Hamilton is being seen for hospital follow-up accompanied by his sister. He continues to reside at [facility] currently receiving PT/OT/SLP. He reports improvement since discharge. He continues to complain of dizziness, imbalance, headache, and speech difficulty. Dizziness and headaches occur with standing and ambulation which was reported during inpatient therapy sessions. He tranfers via w/c due to dizziness but ambulates with RW with therapy. Denies new or worseing stroke/TIA symptoms. Remains on eliquis without bleeding or bruising. Remains on atrovastatin without myalgias. Blood pressure today 143/81.  Obviously abstinence from EtOH, tobacco and cocaine.  No further concerns at this time.  Stroke admission 10/12/2019 [name] is a 55 y.o. male with  history of depression, DM2, CHF with EF of 15%, EtOH use, cocaine use who is admitted 10/12/2019 with SOB and chest pain. On Ativan PRN per CIWA protocol. On 10/16/2019 noted to have difficulty with language (garbled speech and not following commands).  Personally reviewed pertinent hospitalization progress notes, lab work and imaging with summary provided.  Evaluated by Dr. Roda Shutters with stroke work-up revealing right MCA infarct embolic most likely secondary to cardiomyopathy with low EF 15 to 20% in setting of EtOH and cocaine use.  Recommend initiate anticoagulation 7 to 10 days post stroke given severe cardiomyopathy with stroke and Eliquis initiated on 9/28.  Advise AC can be discontinued once EF > 30 to 35%.  Once off AC, recommend 30-day cardiac event monitor or loop recorder to rule out A. fib.  Discussed EtOH, tobacco and cocaine cessation with counseling provided.  LDL 102 and initiate atorvastatin 40 mg daily.  Elevated ALT and advised monitor LFTs outpatient.  Controlled DM with A1c 6.3.  Evaluated by therapy and recommended discharge to CIR but eventually discharged to SNF for extensive rehab for residual aphasia, cognitive impairment, dizziness and gait impairment.  Stroke:   R MCA infarct embolic most likely secondary to cardiomyopathy w/ low EF in setting of alcohol and cocaine use  CT head acute/subacute R poster MCA infarct w/ mild local mass effect.  MRI  Moderate R frontoparietal, posterior R MCA infarct  MRA  Unremarkable   Carotid Doppler  B ICA 1-39% stenosis, VAs antegrade   2D Echo EF 15-20%. No source of embolus. LA severely dilated  LDL 102  HgbA1c 6.3  UDS neg  VTE prophylaxis - Lovenox 40 mg sq daily  No antithrombotic prior to admission, now on aspirin 81 mg daily. From neuro standpoint, recommend anticoagulation in 7-10 days post stroke (9/27-9/30) given severe cardiomyopathy with stroke.  Therapy recommendations:  CIR -->SNF  Disposition:  SNF      ROS:    14 system review of systems performed and negative with exception of those listed in HPI  PMH:  Past Medical History:  Diagnosis Date  . Cocaine abuse (HCC)   . Depression   . Diabetes mellitus without complication (HCC)   . Systolic congestive heart failure (HCC)   . Tobacco abuse     PSH:  Past Surgical History:  Procedure Laterality Date  . RIGHT/LEFT HEART CATH AND CORONARY ANGIOGRAPHY N/A 06/13/2018   Procedure: RIGHT/LEFT HEART CATH AND CORONARY ANGIOGRAPHY;  Surgeon: [place]az[place], Peter M, MD;  Location: [facility] INVASIVE CV LAB;  Service: Cardiovascular;  Laterality: N/A;    Social History:  Social History   Socioeconomic History  . Marital status: Single    Spouse name: Not on file  . Number of children: Not on file  . Years of education: Not on file  . Highest education level: Not on file  Occupational History  . Not on file  Tobacco Use  . Smoking status: Current Every Day Smoker    Packs/day: 1.00    Types: Cigarettes, Cigars  . Smokeless tobacco: Never Used  Vaping Use  . Vaping Use: Never used  Substance and Sexual Activity  . Alcohol use: Yes    Alcohol/week: 6.0 standard drinks    Types: 6 Cans of beer per week    Comment: daily  . Drug use: Yes    Types: "Crack" cocaine  . Sexual activity: Not Currently  Other Topics Concern  . Not on file  Social History Narrative  . Not on file   Social Determinants of Health   Financial Resource Strain: Not on file  Food Insecurity: Not on file  Transportation Needs: Not on file  Physical Activity: Not on file  Stress: Not on file  Social Connections: Not on file  Intimate Partner Violence: Not on file    Family History:  Family History  Problem Relation Age of Onset  . CAD Paternal Grandmother   . Diabetes Mellitus II Neg Hx     Medications:   Current Outpatient Medications on File Prior to Visit  Medication Sig Dispense Refill  . apixaban (ELIQUIS) 5 MG TABS tablet Take 1 tablet (5 mg total) by mouth 2  (two) times daily. 60 tablet 0  . FLUoxetine (PROZAC) 20 MG capsule Take 1 capsule (20 mg total) by mouth daily. 30 capsule 0  . furosemide (LASIX) 40 MG tablet Take 1 tablet (40 mg total) by mouth daily. 30 tablet 2  . meclizine (ANTIVERT) 12.5 MG tablet Take 12.5 mg by mouth 3 (three) times daily.    . metFORMIN (GLUCOPHAGE) 500 MG tablet Take by mouth 2 (two) times daily with a meal.    . pantoprazole (PROTONIX) 40 MG tablet TAKE 1 TABLET(40 MG) BY MOUTH DAILY 90 tablet 0  . QUEtiapine (SEROQUEL) 50 MG tablet Take 1 tablet (50 mg total) by mouth at bedtime. 30 tablet 0  . thiamine 100 MG tablet Take 1 tablet (100 mg total) by mouth daily. 30 tablet 0  . carvedilol (COREG) 3.125 MG tablet Take 1 tablet (3.125 mg total) by mouth 2 (two) times daily with a meal. (Patient not taking: Reported on 10/12/2019) 60 tablet 2  . losartan (COZAAR) 25 MG tablet  Take 0.5 tablets (12.5 mg total) by mouth daily. 15 tablet 2  . spironolactone (ALDACTONE) 25 MG tablet Take 0.5 tablets (12.5 mg total) by mouth daily. 45 tablet 3   No current facility-administered medications on file prior to visit.    Allergies:  No Known Allergies    OBJECTIVE:  Physical Exam  Vitals:   04/10/20 1332  BP: (!) 150/86  Pulse: 67  Height: 6' (1.829 m)   Body mass index is 22.11 kg/m. No exam data present  General: well developed, well nourished,  very [name], seated, in no evident distress Head: head normocephalic and atraumatic.   Neck: supple with no carotid or supraclavicular bruits Cardiovascular: regular rate and rhythm, no murmurs Musculoskeletal: no deformity Skin:  no rash/petichiae Vascular:  Normal pulses all extremities   Neurologic Exam Mental Status: Awake and fully alert.   Mild to moderate expressive > mild receptive aphasia.  Mild dysarthria.  Able to follow simple step commands.  Oriented to place and time. Recent and remote memory appears intact. Attention  span, concentration and fund of knowledge appropriate during visit. Mood and affect appropriate and cooperative with exam.  Cranial Nerves: Pupils equal, briskly reactive to light. Extraocular movements full without nystagmus.  Not consistent with blinking to visual threat bilaterally. Hearing intact. Facial sensation intact. Face, tongue, palate moves normally and symmetrically.  Motor: Normal bulk and tone. Normal strength in all tested extremity muscles. Sensory.:  Intact to light touch, vibratory and pinprick sensation throughout Coordination: Rapid alternating movements normal in all extremities. Finger-to-nose and heel-to-shin performed accurately bilaterally. Gait and Station: Deferred  Reflexes: 1+ and symmetric. Toes downgoing.          ASSESSMENT: Kerry Hamilton is a 55 y.o. year old male presented initially on 10/12/2019 with SOB an chest pain developing difficulty with language and not following commands on 10/16/2019 with stroke work-up revealing right MCA infarct embolic most likely secondary to cardiomyopathy with low EF 15 to 20% in setting of EtOH and cocaine use. Vascular risk factors include substance abuse, tobacco use, medication noncompliance, DM, HTN, HLD, and CHF with a EF 15 to 20%.      PLAN:  1. R MCA stroke :  a. Residual deficit: Expressive> receptive aphasia, dysarthria, and dizziness  i. Continue participation with speech therapy for hopeful ongoing improvement ii. Question if dizziness possibly related to blood pressure and decreased cardiac function -discussed need for follow-up with cardiology b. Continue Eliquis (apixaban) daily  and atorvastatin for secondary stroke prevention.  c. Discussed secondary stroke prevention measures and importance of close PCP follow up for aggressive stroke risk factor management  2. CHF 2/2 cardiomyopathy: a. Due to alcohol and cocaine use b. Continuation of Eliquis in setting of recent stroke c. Per Dr. Pearlean Brownie, recommend  continuation of Eliquis until EF > 30%.  If Eliquis discontinued in the future, Dr. Pearlean Brownie recommends cardiac monitor or loop recorder to assess for atrial fibrillation d. Again, provided sister with heart failure and cardiac clinic office numbers to schedule follow-up appointment 3. Substance abuse: Reports complete abstinence currently residing in SNF.  Discussed importance of continued abstinence in setting of CHF with cardiomyopathy and recent stroke 4. HTN: BP goal <130/90.  Slightly elevated today on furosemide, spironolactone, carvedilol and losartan per PCP/cardiology 5. HLD: LDL goal <70.  Lipid panel monitored at facility.  Continue atorvastatin managed and monitored by facility 6. DMII: A1c goal<7.0.  A1c monitored at facility.  On Metformin per PCP  Follow up in 6 months or call earlier if needed   CC:  [facility] provider: Dr. Pearlean Brownie Medicine, [facility]    I spent 30 minutes of face-to-face and non-face-to-face time with patient and sister.  This included previsit chart review, lab review, study review, order entry, electronic health record documentation, patient education regarding prior stroke and etiology, residual deficits, importance of managing stroke risk factors and indication to follow-up with cardiology and answered all other questions to patient and sisters satisfaction   Ihor Austin, [facility]  [facility] [address] [place], [place] [address]  Phone [phone] Fax [phone] Note: This document was prepared with digital dictation and possible smart phrase technology. Any transcriptional errors that result from this process are unintentional.

## 2020-04-13 NOTE — Progress Notes (Signed)
I agree with the above plan 

## 2020-05-08 ENCOUNTER — Ambulatory Visit (HOSPITAL_COMMUNITY)
Admission: RE | Admit: 2020-05-08 | Discharge: 2020-05-08 | Disposition: A | Discharge status: Home / Self Care | Payer: Medicaid Other | Source: Ambulatory Visit | Attending: Cardiology | Admitting: Cardiology

## 2020-05-08 ENCOUNTER — Other Ambulatory Visit: Payer: Self-pay

## 2020-05-08 ENCOUNTER — Encounter (HOSPITAL_COMMUNITY): Payer: Self-pay

## 2020-05-08 VITALS — BP 150/92 | HR 87 | Wt 207.2 lb

## 2020-05-08 DIAGNOSIS — I428 Other cardiomyopathies: Secondary | ICD-10-CM | POA: Diagnosis not present

## 2020-05-08 DIAGNOSIS — Z79899 Other long term (current) drug therapy: Secondary | ICD-10-CM | POA: Insufficient documentation

## 2020-05-08 DIAGNOSIS — Z8249 Family history of ischemic heart disease and other diseases of the circulatory system: Secondary | ICD-10-CM | POA: Diagnosis not present

## 2020-05-08 DIAGNOSIS — I5022 Chronic systolic (congestive) heart failure: Secondary | ICD-10-CM

## 2020-05-08 DIAGNOSIS — E119 Type 2 diabetes mellitus without complications: Secondary | ICD-10-CM | POA: Insufficient documentation

## 2020-05-08 DIAGNOSIS — Z7984 Long term (current) use of oral hypoglycemic drugs: Secondary | ICD-10-CM | POA: Diagnosis not present

## 2020-05-08 DIAGNOSIS — Z9114 Patient's other noncompliance with medication regimen: Secondary | ICD-10-CM | POA: Diagnosis not present

## 2020-05-08 DIAGNOSIS — Z7901 Long term (current) use of anticoagulants: Secondary | ICD-10-CM | POA: Diagnosis not present

## 2020-05-08 DIAGNOSIS — Z8673 Personal history of transient ischemic attack (TIA), and cerebral infarction without residual deficits: Secondary | ICD-10-CM | POA: Insufficient documentation

## 2020-05-08 DIAGNOSIS — Z87891 Personal history of nicotine dependence: Secondary | ICD-10-CM | POA: Insufficient documentation

## 2020-05-08 LAB — BASIC METABOLIC PANEL
Anion gap: 12 (ref 5–15)
BUN: 17 mg/dL (ref 6–20)
CO2: 23 mmol/L (ref 22–32)
Calcium: 9.4 mg/dL (ref 8.9–10.3)
Chloride: 96 mmol/L — ABNORMAL LOW (ref 98–111)
Creatinine, Ser: 1.29 mg/dL — ABNORMAL HIGH (ref 0.61–1.24)
GFR, Estimated: >60 mL/min (ref >60)
Glucose, Bld: 349 mg/dL — ABNORMAL HIGH (ref 70–99)
Potassium: 4.3 mmol/L (ref 3.5–5.1)
Sodium: 131 mmol/L — ABNORMAL LOW (ref 135–145)

## 2020-05-08 MED ORDER — SPIRONOLACTONE 25 MG PO TABS: 12.5000 mg | ORAL_TABLET | Freq: Every day | ORAL | 3 refills | Status: DC

## 2020-05-08 MED ORDER — LOSARTAN POTASSIUM 25 MG PO TABS: 12.5000 mg | ORAL_TABLET | Freq: Two times a day (BID) | ORAL | 3 refills | Status: DC

## 2020-05-08 MED ORDER — SPIRONOLACTONE 25 MG PO TABS: 25.0000 mg | ORAL_TABLET | Freq: Every day | ORAL | 3 refills | Status: DC

## 2020-05-08 NOTE — Progress Notes (Signed)
ADVANCED HF CLINIC PROGRESS NOTE  Referring Physician: Hilty Primary Care: [place] Primary Cardiologist: Hilty AHF: Dr. Gala Romney    HPI: Kerry Hamilton is a 55 y.o. male with a past medial history significant for schizophrenia, tobacco/ETOH abuse, diabetes and systolic HF due to NICM with EF 15%, initially referred by Dr. Rennis Golden for further evaluation of HF.   He presented in May 2020 with progressive shortness of breath with minimal activity was found to be in congestive heart failure with elevated BNP and troponin.  Echo LVEF 15% with severely dilated LV and pseudonormalization of diastolic function.  Ultimately underwent right and left heart catheterization after diuresis, demonstrated no significant coronary disease with mildly elevated LV filling pressures and a cardiac index of 2.2.   Had initial [facility] consultation w/ Dr. Gala Romney 10/20. Fluid status was ok. Meds were titrated. Entresto increased to 49/51 bid and spironolactone added. Unfortunately, he failed to f/u after his initial visit.   He was seen in the ED in March 2021 with SOB and CP and found to be mildly volume overloaded. This was in the setting of poor compliance w/ home lasix and cocaine and ETOH use. HS trops were flat, 27>>28. BNP 1,169.  He was treated w/ dose of IV Lasix in the ED w/ improvement in symptoms and did not require admission. He was given new Rx for PO Lasix and instructed to f/u as outpatient. He did not immediately follow up in the [facility] but return several months later in June 2021. Was feeling fairly well, NYHA Class II. He was still drinking beer (about 6 cans a day) and still smoking cigarettes and reported ongoing cocaine use. We tried titrating meds. His BP was felt too low for Entresto, so we started losartan.   Unfortunately, he was lost to f/u again and was admitted 9/21 w/ moderate-large R MCA infarct on 9/21, felt to be embolic, most likely secondary tocardiomyopathy w/ low EF in setting of alcohol  and cocaine use, per neuro.  No Afib detection on tele. Echo w/ EF 15-20%. No thrombus identified. Carotid duplex negative. His cardiac meds were continued spiro, losartan, dig and lasix. Neurology started him on Eliquis. He was discharged to SNF at [facility]. Again, he was lost to f/u in the [facility].    He was recently seen 3/22 by neurology for routine f/u and referred back to our office for ongoing cardiac care. Of note, neurology has recommended continuation of Eliquis until EF is > 30%.   He presents to clinic today in a wheel chair. Here w/ his sister. Still resides at [facility]. Continues w/ expressive dysphasia. Continues w/ speech therapy. No other significant deficits. He has been getting his cardiac meds regularly. BP elevated in clinic today at 150/92 and reportedly has been elevated at SNF. Denies dyspnea, orthopnea, PND and no wt gain. No abnormal bleeding w/ eliquis. He does have some occasional mild dizziness w/ positional changes, but resolves quickly. No syncope/ near syncope.     Past Medical History:  Diagnosis Date  . Cocaine abuse (HCC)   . CVA (cerebral vascular accident) (HCC) 09/2019  . Depression   . Diabetes mellitus without complication (HCC)   . Systolic congestive heart failure (HCC)   . Tobacco abuse     Current Outpatient Medications  Medication Sig Dispense Refill  . apixaban (ELIQUIS) 5 MG TABS tablet Take 1 tablet (5 mg total) by mouth 2 (two) times daily. 60 tablet 0  . carvedilol (COREG) 3.125 MG tablet  Take 3.125 mg by mouth 2 (two) times daily with a meal.    . docusate sodium (COLACE) 100 MG capsule Take 100 mg by mouth 2 (two) times daily.    Marland Kitchen FLUoxetine (PROZAC) 20 MG capsule Take 1 capsule (20 mg total) by mouth daily. 30 capsule 0  . furosemide (LASIX) 20 MG tablet Take 40 mg by mouth daily.    . metFORMIN (GLUCOPHAGE) 500 MG tablet Take by mouth 2 (two) times daily with a meal.    . pantoprazole (PROTONIX) 40 MG tablet TAKE 1 TABLET(40  MG) BY MOUTH DAILY 90 tablet 0  . polyethylene glycol (MIRALAX / GLYCOLAX) 17 g packet Take 17 g by mouth daily as needed for mild constipation, moderate constipation or severe constipation.    . QUEtiapine (SEROQUEL) 50 MG tablet Take 1 tablet (50 mg total) by mouth at bedtime. 30 tablet 0  . thiamine 100 MG tablet Take 1 tablet (100 mg total) by mouth daily. 30 tablet 0  . losartan (COZAAR) 25 MG tablet Take 0.5 tablets (12.5 mg total) by mouth 2 (two) times daily. 90 tablet 3  . spironolactone (ALDACTONE) 25 MG tablet Take 1 tablet (25 mg total) by mouth daily. 45 tablet 3   No current facility-administered medications for this encounter.    No Known Allergies    Social History   Socioeconomic History  . Marital status: Single    Spouse name: Not on file  . Number of children: Not on file  . Years of education: Not on file  . Highest education level: Not on file  Occupational History  . Not on file  Tobacco Use  . Smoking status: Former Smoker    Packs/day: 1.00    Types: Cigarettes, Cigars  . Smokeless tobacco: Never Used  Vaping Use  . Vaping Use: Never used  Substance and Sexual Activity  . Alcohol use: Yes    Alcohol/week: 6.0 standard drinks    Types: 6 Cans of beer per week    Comment: daily  . Drug use: Yes    Types: "Crack" cocaine  . Sexual activity: Not Currently  Other Topics Concern  . Not on file  Social History Narrative  . Not on file   Social Determinants of Health   Financial Resource Strain: Not on file  Food Insecurity: Not on file  Transportation Needs: Not on file  Physical Activity: Not on file  Stress: Not on file  Social Connections: Not on file  Intimate Partner Violence: Not on file      Family History  Problem Relation Age of Onset  . CAD Paternal Grandmother   . Diabetes Mellitus II Neg Hx     Vitals:   05/08/20 1411  BP: (!) 150/92  Pulse: 87  SpO2: 99%  Weight: 94 kg (207 lb 3.2 oz)    PHYSICAL EXAM: General:  Well  appearing. No respiratory difficulty HEENT: normal Neck: supple. no JVD. Carotids 2+ bilat; no bruits. No lymphadenopathy or thryomegaly appreciated. Cor: PMI nondisplaced. Regular rate & rhythm. No rubs, gallops or murmurs. Lungs: clear Abdomen: soft, nontender, nondistended. No hepatosplenomegaly. No bruits or masses. Good bowel sounds. Extremities: no cyanosis, clubbing, rash, edema Neuro: alert & oriented x 3, cranial nerves grossly intact. moves all 4 extremities w/o difficulty. Affect pleasant.   ASSESSMENT & PLAN:  1. Chronic systolic HF due to NICM - echo 5/20 EF 15% - cath no CAD 5/20  - Echo 9/21 EF 15-20%, RV severely reduced (this was in  setting of poor med compliance and cocaine use. Compliance now improved and no further drug use now that he is in SNF post CVA) - Functional status difficult to ascertain given he primarily uses wheelchair but he denies resting dyspnea and no dyspnea w/ transfers. Euvolemic on exam. BP moderately elevated - Continue Lasix 40 mg daily  - Continue carvedilol 3.125 bid    - Increase losartan to 12.5 mg bid - Increase Spiro to 25 mg daily  - If BP tolerates and if no worsening dizziness, will try stopping losartan and adding Entresto next visit  - SGLT2i soon  - plan to repeat echo after meds are further optimized - per neuro continue Eliquis for secondary prevention of cardiac thrombus formation, as long as EF remains < 30%  2. CVA: moderate-large R MCA infarct on 9/21, felt to be embolic, most likely secondary tocardiomyopathy w/ low EF in setting of alcohol and cocaine use, per neuro.  - continues w/ expressive dysphasia>>getting speech therapy  - c/w Eliquis per neuro   3. DM2 - Consider SGLT2i soon, next visit  4. Former Polysubstance abuse  - no further use now residing at [facility]  F/u w/ PharmD in 3-4 weeks. Dr. Gala Romney in 8 weeks.   Robbie Lis, PA-C  4:46 PM

## 2020-05-08 NOTE — Patient Instructions (Addendum)
Labs done today. We will contact you only if your labs are abnormal.  INCREASE Spironolactone 25mg  (1 tablet) by mouth daily.  INCREASE Losartan to 12.5mg  (1/2 tablet) by mouth 2 times daily.  No other medication changes were made. Please continue all current medications as prescribed.  Your physician recommends that you schedule a follow-up appointment in: 3-4 weeks with our Clinic Pharmacist and in 8 weeks with our APP Clinic here in our office.   If you have any questions or concerns before your next appointment please send a message through [place] or call our office at [phone].    TO LEAVE A MESSAGE FOR THE NURSE SELECT OPTION 2, PLEASE LEAVE A MESSAGE INCLUDING: . YOUR NAME . DATE OF BIRTH . CALL BACK NUMBER . REASON FOR CALL**this is important as we prioritize the call backs  YOU WILL RECEIVE A CALL BACK THE SAME DAY AS LONG AS YOU CALL BEFORE 4:00 PM   Do the following things EVERYDAY: 1) Weigh yourself in the morning before breakfast. Write it down and keep it in a log. 2) Take your medicines as prescribed 3) Eat low salt foods--Limit salt (sodium) to 2000 mg per day.  4) Stay as active as you can everyday 5) Limit all fluids for the day to less than 2 liters   At the [facility], you and your health needs are our priority. As part of our continuing mission to provide you with exceptional heart care, we have created designated Provider Care Teams. These Care Teams include your primary Cardiologist (physician) and Advanced Practice Providers (APPs- Physician Assistants and Nurse Practitioners) who all work together to provide you with the care you need, when you need it.   You may see any of the following providers on your designated Care Team at your next follow up: [phone] Dr Marland Kitchen . Dr Arvilla Meres . Marca Ancona, NP . Tonye Becket, PA . Robbie Lis, PharmD   Please be sure to bring in all your medications bottles to every  appointment.

## 2020-06-16 ENCOUNTER — Other Ambulatory Visit (HOSPITAL_COMMUNITY): Payer: Medicaid Other

## 2020-06-19 ENCOUNTER — Encounter (HOSPITAL_COMMUNITY): Payer: Self-pay

## 2020-06-19 ENCOUNTER — Ambulatory Visit (HOSPITAL_COMMUNITY)
Admission: RE | Admit: 2020-06-19 | Discharge: 2020-06-19 | Disposition: A | Discharge status: Home / Self Care | Payer: Medicaid Other | Source: Ambulatory Visit | Attending: Internal Medicine | Admitting: Internal Medicine

## 2020-06-19 ENCOUNTER — Other Ambulatory Visit: Payer: Self-pay

## 2020-06-19 VITALS — BP 124/82 | HR 70 | Wt 205.0 lb

## 2020-06-19 DIAGNOSIS — F1490 Cocaine use, unspecified, uncomplicated: Secondary | ICD-10-CM | POA: Diagnosis not present

## 2020-06-19 DIAGNOSIS — Z8673 Personal history of transient ischemic attack (TIA), and cerebral infarction without residual deficits: Secondary | ICD-10-CM | POA: Diagnosis not present

## 2020-06-19 DIAGNOSIS — E119 Type 2 diabetes mellitus without complications: Secondary | ICD-10-CM | POA: Diagnosis not present

## 2020-06-19 DIAGNOSIS — I5021 Acute systolic (congestive) heart failure: Secondary | ICD-10-CM | POA: Diagnosis present

## 2020-06-19 DIAGNOSIS — I428 Other cardiomyopathies: Secondary | ICD-10-CM | POA: Diagnosis not present

## 2020-06-19 DIAGNOSIS — F17210 Nicotine dependence, cigarettes, uncomplicated: Secondary | ICD-10-CM | POA: Diagnosis not present

## 2020-06-19 LAB — BASIC METABOLIC PANEL
Anion gap: 8 (ref 5–15)
BUN: 11 mg/dL (ref 6–20)
CO2: 25 mmol/L (ref 22–32)
Calcium: 9.6 mg/dL (ref 8.9–10.3)
Chloride: 99 mmol/L (ref 98–111)
Creatinine, Ser: 1.01 mg/dL (ref 0.61–1.24)
GFR, Estimated: >60 mL/min (ref >60)
Glucose, Bld: 262 mg/dL — ABNORMAL HIGH (ref 70–99)
Potassium: 4.3 mmol/L (ref 3.5–5.1)
Sodium: 132 mmol/L — ABNORMAL LOW (ref 135–145)

## 2020-06-19 MED ORDER — ENTRESTO 24-26 MG PO TABS: 1.0000 | ORAL_TABLET | Freq: Two times a day (BID) | ORAL | 3 refills | Status: DC

## 2020-06-19 NOTE — Patient Instructions (Signed)
It was a pleasure seeing you today!  MEDICATIONS: -We are changing your medications today -Start Entresto 24/26 mg 1 tablet by mouth twice daily -Stop losartan -Call if you have questions about your medications.  LABS: -We will call you if your labs need attention.  NEXT APPOINTMENT: Return to clinic in 1 month with HF NP/PA.  In general, to take care of your heart failure: -Limit your fluid intake to 2 Liters (half-gallon) per day.   -Limit your salt intake to ideally 2-3 grams (2000-3000 mg) per day. -Weigh yourself daily and record, and bring that "weight diary" to your next appointment.  (Weight gain of 2-3 pounds in 1 day typically means fluid weight.) -The medications for your heart are to help your heart and help you live longer.   -Please contact us before stopping any of your heart medications.  Call the clinic at [phone] with questions or to reschedule future appointments.

## 2020-06-19 NOTE — Progress Notes (Signed)
Primary Care: [facility] Primary Cardiologist: Hilty AHF: Dr. Gala Romney    HPI:  Kerry Rogersis a 55 y.o.malewith a past medial history significant for schizophrenia, tobacco/ETOH abuse, diabetes and systolic HF due to NICM with EF 15%, initially referred by Dr. Rennis Golden for further evaluation of HF.   He presented in May 2020 with progressive shortness of breath with minimal activity was found to be in congestive heart failure with elevated BNP and troponin. Echo LVEF 15% with severely dilated LV and pseudonormalization of diastolic function. Ultimately underwent right and left heart catheterization after diuresis, demonstrated no significant coronary disease with mildly elevated LV filling pressures and a cardiac index of 2.2.   Had initial [facility] consultation w/ Dr. Gala Romney 10/20. Fluid status was ok. Meds were titrated. Entresto increased to 49/51 bid and spironolactone added. Unfortunately, he failed to f/u after his initial visit.   He was seen in the ED in March 2021 with SOB and CP and found to be mildly volume overloaded. This was in the setting of poor compliance w/ home lasix and cocaine and ETOH use. HS trops were flat, 27>>28. BNP 1,169.  He was treated w/ dose of IV Lasix in the ED w/ improvement in symptoms and did not require admission. He was given new Rx for PO Lasix and instructed to f/u as outpatient. He did not immediately follow up in the [facility] but return several months later in June 2021. Was feeling fairly well, NYHA Class II. He was still drinking beer (about 6 cans a day) and still smoking cigarettes and reported ongoing cocaine use. His BP was felt too low for Entresto, so losartan was initiated.   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 No Afib detection on tele. Echo w/ EF 15-20%. No thrombus  identified. Carotid duplex negative. His cardiac meds were continued spiro, losartan,dig andlasix. Neurology started him on Eliquis. He was discharged to SNF at [facility]. Again, he was lost to f/u in the [facility].    He was then seen on 3/22 by neurology for routine f/u and referred back to our office for ongoing cardiac care. Of note, neurology has recommended continuation of Eliquis until EF is > 30%.   He was then seen back on 4/14 in HF clinic by [facility]. Still residing at [facility]. Continues w/ expressive dysphasia and speech therapy. No other significant deficits. He has been getting his cardiac meds regularly. BP elevated in clinic at 150/92 and reportedly has been elevated at SNF. Denied dyspnea, orthopnea, PND and no wt gain. He did report having some occasional mild dizziness w/ positional changes, but resolves quickly. No syncope/ near syncope.    Today he returns to HF clinic for pharmacist medication titration. At last visit with PA, his losartan was increased to 12.5 mg BID and spironolactone increased to 25 mg daily. Overall, he is feeling well. He still has some dizziness when standing. He reports his SBP at the facility is ~130s. Denies shortness of breath, chest pain, palpitations, edema, or orthopnea. He states they check his weight at the facility daily but is unsure what his weights have been. He states his breathing is normal.   HF Medications: Carvedilol 3.125 mg BID Losartan 12.5 mg BID Spironolactone 25 mg daily Furosemide 40 mg daily  Has the patient been experiencing any side effects to  the medications prescribed?  no  Does the patient have any problems obtaining medications due to transportation or finances?   no  Understanding of regimen: good Understanding of indications: good Potential of compliance: excellent Patient understands to avoid NSAIDs. Patient understands to avoid decongestants.    Pertinent Lab Values: . 5/26: Serum creatinine  1.01, BUN 11, Potassium 4.3, Sodium 132 . 4/14: Serum creatinine 1.29, BUN 17, Potassium 4.3, Sodium 131   Vital Signs: . Weight: 205 lbs (last clinic weight: 207 lbs) . Blood pressure: 124/82 mmHg  . Heart rate: 70 bpm   Assessment/Plan: 1. Chronic systolic HF due to NICM - echo 5/20 EF 15% - cath no CAD 5/20  - Echo 9/21 EF 15-20%, RV severely reduced (this was in setting of poor med compliance and cocaine use. Compliance now improved and no further drug use now that he is in SNF post CVA) - Functional status difficult to ascertain given he primarily uses wheelchair but he denies resting dyspnea and no dyspnea w/ transfers. Euvolemic on exam. BP improved compared to prior visit.  - Continue furosemide 40 mg daily  - Continue carvedilol 3.125 bid    - Stop losartan and start Entresto 24/26 mg BID. BMET today and repeat at next visit.  - Continue spironolactone 25 mg daily  - SGLT2i soon  - plan to repeat echo after meds are further optimized - per neuro continue Eliquis for secondary prevention of cardiac thrombus formation, as long as EF remains < 30%  2. 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  - continues w/ expressive dysphasia>>getting speech therapy  - c/w Eliquis per neuro   3. DM2 - Consider SGLT2i soon, next visit  4. Former Polysubstance abuse  - no further use now residing at [facility]  Follow up on 6/22 with HF NP/PA  Sharen Hones, PharmD, BCPS Heart Failure Clinic Pharmacist [phone]

## 2020-07-07 ENCOUNTER — Encounter (HOSPITAL_COMMUNITY): Payer: Medicaid Other

## 2020-07-16 ENCOUNTER — Encounter (HOSPITAL_COMMUNITY): Payer: Medicaid Other

## 2020-07-17 ENCOUNTER — Encounter (HOSPITAL_COMMUNITY): Payer: Medicaid Other

## 2020-08-02 NOTE — Progress Notes (Signed)
ADVANCED HF CLINIC PROGRESS NOTE Primary Care: [facility] Primary Cardiologist: Hilty AHF: Dr. Gala Romney    HPI: Kerry Hamilton is a 55 y.o. male with a past medial history significant for schizophrenia, tobacco/ETOH abuse, diabetes and systolic HF due to NICM with EF 15%.  Hospitalized in 5/20 with progressive shortness of breath with minimal activity was found to be in congestive heart failure with elevated BNP and troponin.  Echo LVEF 15% with severely dilated LV and pseudonormalization of diastolic function.  Ultimately underwent right and left heart catheterization after diuresis, demonstrated no significant coronary disease with mildly elevated LV filling pressures and a cardiac index of 2.2.   Had initial [facility] consultation w/ Dr. Gala Romney 10/20. Fluid status was ok. Meds were titrated. Entresto increased to 49/51 bid and spironolactone added. Unfortunately, he failed to f/u after his initial visit.   He was seen in the ED in March 2021 with SOB and CP and found to be mildly volume overloaded. In setting of poor compliance w/ home lasix and cocaine and ETOH use. He was treated w/ dose of IV Lasix in the ED w/ improvement in symptoms and did not require admission. He was instructed to f/u as outpatient. He followed up June 2021. Was feeling fairly well, NYHA Class II. Still drinking beer (~ 6 cans/day), smoking cigarettes, + cocaine use. We tried titrating meds. His BP was felt too low for Entresto, started losartan.   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  No Afib detected.  Echo w/ EF 15-20%. No thrombus identified. Carotid duplex negative. His cardiac meds were continued spiro, losartan, dig and lasix. Neurology started him on Eliquis. He was discharged to SNF at [facility]. Again, he was lost to f/u in the  [facility].    Seen 3/22 by neurology for routine f/u and referred back to our office for ongoing cardiac care. Of note, neurology has recommended continuation of Eliquis until EF is > 30%.   He presented to clinic 4/22 for follow up. BP has been elevated at SNF. He does have some occasional mild dizziness w/ positional changes, but resolves quickly. No syncope/ near syncope. Stable NYHA II-III symptoms, volume good.    Today he returns for HF follow up. Overall feeling fine, struggling with dizziness every time he stands for transfers, says this has been on-going since his CVA. No falls. Denies increasing SOB, CP, dizziness, edema, or PND/Orthopnea. Appetite ok. No fever or chills. Weight at home 206 pounds. Taking all medications. Resides at [name], still with expressive aphasia, has PT and SLP at facility. No drugs or ETO use.   Cardiac Studies: - Echo 5/20 EF 15%. - LHC (5/20): no CAD.  - Echo (9/21): EF 15-20%, RV severely reduced (this was in setting of poor med compliance and cocaine use.   Past Medical History:  Diagnosis Date   Cocaine abuse (HCC)    CVA (cerebral vascular accident) (HCC) 09/2019   Depression    Diabetes mellitus without complication (HCC)    Systolic congestive heart failure (HCC)    Tobacco abuse    Current Outpatient Medications  Medication Sig Dispense Refill   apixaban (ELIQUIS) 5 MG TABS tablet Take 1 tablet (5 mg total) by mouth 2 (two) times daily. 60 tablet 0   carvedilol (COREG) 3.125 MG tablet Take  3.125 mg by mouth 2 (two) times daily with a meal.     docusate sodium (COLACE) 100 MG capsule Take 100 mg by mouth 2 (two) times daily.     FLUoxetine (PROZAC) 20 MG capsule Take 1 capsule (20 mg total) by mouth daily. 30 capsule 0   furosemide (LASIX) 20 MG tablet Take 40 mg by mouth daily.     metFORMIN (GLUCOPHAGE) 500 MG tablet Take by mouth 2 (two) times daily with a meal.     pantoprazole (PROTONIX) 40 MG tablet TAKE 1 TABLET(40 MG) BY MOUTH DAILY  90 tablet 0   polyethylene glycol (MIRALAX / GLYCOLAX) 17 g packet Take 17 g by mouth daily as needed for mild constipation, moderate constipation or severe constipation.     QUEtiapine (SEROQUEL) 50 MG tablet Take 1 tablet (50 mg total) by mouth at bedtime. 30 tablet 0   sacubitril-valsartan (ENTRESTO) 24-26 MG Take 1 tablet by mouth 2 (two) times daily. 60 tablet 3   spironolactone (ALDACTONE) 25 MG tablet Take 1 tablet (25 mg total) by mouth daily. 45 tablet 3   thiamine 100 MG tablet Take 1 tablet (100 mg total) by mouth daily. 30 tablet 0   No current facility-administered medications for this encounter.   No Known Allergies  Social History   Socioeconomic History   Marital status: Single    Spouse name: Not on file   Number of children: Not on file   Years of education: Not on file   Highest education level: Not on file  Occupational History   Not on file  Tobacco Use   Smoking status: Former    Packs/day: 1.00    Pack years: 0.00    Types: Cigarettes, Cigars   Smokeless tobacco: Never  Vaping Use   Vaping Use: Never used  Substance and Sexual Activity   Alcohol use: Yes    Alcohol/week: 6.0 standard drinks    Types: 6 Cans of beer per week    Comment: daily   Drug use: Yes    Types: "Crack" cocaine   Sexual activity: Not Currently  Other Topics Concern   Not on file  Social History Narrative   Not on file   Social Determinants of Health   Financial Resource Strain: Not on file  Food Insecurity: Not on file  Transportation Needs: Not on file  Physical Activity: Not on file  Stress: Not on file  Social Connections: Not on file  Intimate Partner Violence: Not on file   Family History  Problem Relation Age of Onset   CAD Paternal Grandmother    Diabetes Mellitus II Neg Hx    BP (!) 159/89   Pulse 78   Wt 94.2 kg (207 lb 9.6 oz)   SpO2 97%   BMI 28.16 kg/m   Wt Readings from Last 3 Encounters:  08/04/20 94.2 kg (207 lb 9.6 oz)  06/19/20 93 kg (205  lb)  05/08/20 94 kg (207 lb 3.2 oz)   PHYSICAL EXAM: General:  NAD. No resp difficulty, in wheelchair HEENT: Normal Neck: Supple. No JVD. Carotids 2+ bilat; no bruits. No lymphadenopathy or thryomegaly appreciated. Cor: PMI nondisplaced. Regular rate & rhythm. No rubs, gallops or murmurs. Lungs: Clear Abdomen: Soft, nontender, nondistended. No hepatosplenomegaly. No bruits or masses. Good bowel sounds. Extremities: No cyanosis, clubbing, rash, edema Neuro: Alert & oriented x 3, cranial nerves grossly intact. Moves all 4 extremities w/o difficulty. Affect pleasant. Expressive aphasia  ASSESSMENT & PLAN:  1. Chronic systolic HF  due to NICM - Echo 5/20 EF 15% - LHC (5/20): no CAD.  - Echo (9/21): EF 15-20%, RV severely reduced (this was in setting of poor med compliance and cocaine use.  - Functional status difficult as he primarily uses WC, but he denies resting dyspnea and no dyspnea w/ transfers. Euvolemic on exam.  - Start Farxiga 10 mg daily.  - Continue Entresto 24/26 mg bid. - Continue Lasix 40 mg daily for another 3 days then decrease to 20 mg daily. - Continue carvedilol 3.125 bid    - Continue Spiro 25 mg daily  - plan to repeat echo after meds are further optimized - BMET today, repeat 10 days.  2. 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  - Continues w/ expressive dysphasia>>getting speech therapy  - Per neuro continue Eliquis for secondary prevention of cardiac thrombus formation, as long as EF remains < 30%. - Continue Eliquis per neuro. No bleeding issues.   3. DM2 - Start Farxiga. - Labs today, repeat BMET 10 days.  4. Former Polysubstance abuse  - No further use now residing at [facility].  5. HTN - Moderately elevated today. - Plan to increase Entresto at next visit if dizziness resolved.  Follow up in 3-4 weeks with PharmD for further  medication titration (may be able to increase Entresto) & then with Dr. Gala Romney w/ echo.  Anderson Malta Lowell, FNP  08/04/20

## 2020-08-04 ENCOUNTER — Other Ambulatory Visit (HOSPITAL_COMMUNITY): Payer: Self-pay

## 2020-08-04 ENCOUNTER — Ambulatory Visit (HOSPITAL_COMMUNITY)
Admission: RE | Admit: 2020-08-04 | Discharge: 2020-08-04 | Disposition: A | Discharge status: Home / Self Care | Payer: Medicaid Other | Source: Ambulatory Visit | Attending: Family Medicine | Admitting: Family Medicine

## 2020-08-04 ENCOUNTER — Encounter (HOSPITAL_COMMUNITY): Payer: Self-pay

## 2020-08-04 ENCOUNTER — Other Ambulatory Visit: Payer: Self-pay

## 2020-08-04 VITALS — BP 159/89 | HR 78 | Wt 207.6 lb

## 2020-08-04 DIAGNOSIS — E119 Type 2 diabetes mellitus without complications: Secondary | ICD-10-CM | POA: Diagnosis not present

## 2020-08-04 DIAGNOSIS — Z8249 Family history of ischemic heart disease and other diseases of the circulatory system: Secondary | ICD-10-CM | POA: Diagnosis not present

## 2020-08-04 DIAGNOSIS — I428 Other cardiomyopathies: Secondary | ICD-10-CM | POA: Insufficient documentation

## 2020-08-04 DIAGNOSIS — Z8673 Personal history of transient ischemic attack (TIA), and cerebral infarction without residual deficits: Secondary | ICD-10-CM | POA: Insufficient documentation

## 2020-08-04 DIAGNOSIS — F17210 Nicotine dependence, cigarettes, uncomplicated: Secondary | ICD-10-CM | POA: Diagnosis not present

## 2020-08-04 DIAGNOSIS — I110 Hypertensive heart disease with heart failure: Secondary | ICD-10-CM | POA: Diagnosis not present

## 2020-08-04 DIAGNOSIS — Z79899 Other long term (current) drug therapy: Secondary | ICD-10-CM | POA: Insufficient documentation

## 2020-08-04 DIAGNOSIS — Z7984 Long term (current) use of oral hypoglycemic drugs: Secondary | ICD-10-CM | POA: Insufficient documentation

## 2020-08-04 DIAGNOSIS — I5022 Chronic systolic (congestive) heart failure: Secondary | ICD-10-CM | POA: Insufficient documentation

## 2020-08-04 DIAGNOSIS — F1910 Other psychoactive substance abuse, uncomplicated: Secondary | ICD-10-CM

## 2020-08-04 DIAGNOSIS — I10 Essential (primary) hypertension: Secondary | ICD-10-CM | POA: Diagnosis not present

## 2020-08-04 DIAGNOSIS — Z7901 Long term (current) use of anticoagulants: Secondary | ICD-10-CM | POA: Insufficient documentation

## 2020-08-04 LAB — BASIC METABOLIC PANEL
Anion gap: 10 (ref 5–15)
BUN: 11 mg/dL (ref 6–20)
CO2: 24 mmol/L (ref 22–32)
Calcium: 9.6 mg/dL (ref 8.9–10.3)
Chloride: 100 mmol/L (ref 98–111)
Creatinine, Ser: 1.09 mg/dL (ref 0.61–1.24)
GFR, Estimated: >60 mL/min (ref >60)
Glucose, Bld: 207 mg/dL — ABNORMAL HIGH (ref 70–99)
Potassium: 4.1 mmol/L (ref 3.5–5.1)
Sodium: 134 mmol/L — ABNORMAL LOW (ref 135–145)

## 2020-08-04 MED ORDER — FUROSEMIDE 20 MG PO TABS: 20.0000 mg | ORAL_TABLET | Freq: Every day | ORAL | 3 refills | Status: DC

## 2020-08-04 NOTE — Patient Instructions (Addendum)
Labs done today. We will contact you only if your labs are abnormal.  START Kerry Hamilton 10mg  (1 tablet) by mouth daily.  In 3 days decrease lasix to 20mg  (1 tablet) by mouth daily.   No other medication changes were made. Please continue all current medications as prescribed.  Your physician recommends that you schedule a follow-up appointment in: 10 days for a lab only appointment(drawn at facility needs a BMET fax results to [phone]),3-4 weeks with our pharmacy clinic and in 2 months with Dr. with an echo prior to your exam.  If you have any questions or concerns before your next appointment please send [phone] a message through [place] or call our office at [phone].    TO LEAVE A MESSAGE FOR THE NURSE SELECT OPTION 2, PLEASE LEAVE A MESSAGE INCLUDING: YOUR NAME DATE OF BIRTH CALL BACK NUMBER REASON FOR CALL**this is important as we prioritize the call backs  YOU WILL RECEIVE A CALL BACK THE SAME DAY AS LONG AS YOU CALL BEFORE 4:00 PM   Do the following things EVERYDAY: Weigh yourself in the morning before breakfast. Write it down and keep it in a log. Take your medicines as prescribed Eat low salt foods--Limit salt (sodium) to 2000 mg per day.  Stay as active as you can everyday Limit all fluids for the day to less than 2 liters   At the [facility], you and your health needs are our priority. As part of our continuing mission to provide you with exceptional heart care, we have created designated Provider Care Teams. These Care Teams include your primary Cardiologist (physician) and Advanced Practice Providers (APPs- Physician Assistants and Nurse Practitioners) who all work together to provide you with the care you need, when you need it.   You may see any of the following providers on your designated Care Team at your next follow up: Dr [address] [phone], NP Arvilla Meres, Kerry Hamilton, PharmD   Please be sure to  bring in all your medications bottles to every appointment.  Kerry Hamilton

## 2020-08-20 NOTE — Progress Notes (Signed)
Primary Care: [facility] Primary Cardiologist: [facility]ilty AHF: Dr. Gala Romney    HPI:  Kerry Hamilton is a 55 y.o. male with a past medial history significant for schizophrenia, tobacco/ETOH abuse, diabetes and systolic HF due to NICM with EF 15%, initially referred by Dr. Rennis Golden for further evaluation of HF.    He presented in May 2020 with progressive shortness of breath with minimal activity was found to be in congestive heart failure with elevated BNP and troponin.  Echo LVEF 15% with severely dilated LV and pseudonormalization of diastolic function.  Ultimately underwent right and left heart catheterization after diuresis, demonstrated no significant coronary disease with mildly elevated LV filling pressures and a cardiac index of 2.2.   Had initial [facility] consultation w/ Dr. Gala Romney 10/2018. Fluid status was ok. Meds were titrated. Entresto increased to 49/51 BID and spironolactone added. Unfortunately, he failed to f/u after his initial visit.   He was seen in the ED in March 2021 with SOB and CP and found to be mildly volume overloaded. This was in the setting of poor compliance w/ home furosemide and cocaine and ETOH use. HS trops were flat, 27>>28. BNP 1,169.  He was treated w/ dose of IV furosemide in the ED w/ improvement in symptoms and did not require admission. He was given new Rx for PO furosemide and instructed to f/u as outpatient. He did not immediately follow up in the [facility] but return several months later in June 2021. Was feeling fairly well, NYHA Class II. He was still drinking beer (about 6 cans a day) and still smoking cigarettes and reported ongoing cocaine use. His BP was felt too low for Entresto, so losartan was initiated.    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  No Afib detection on tele. Echo w/ EF  15-20%. No thrombus identified. Carotid duplex negative. His cardiac meds were continued spironolactone, losartan, digoxin and furosemide. Neurology started him on Eliquis. He was discharged to SNF at [facility] Again, he was lost to f/u in the [facility].     He was then seen on 03/2020 by neurology for routine f/u and referred back to our office for ongoing cardiac care. Of note, neurology has recommended continuation of Eliquis until EF is > 30%.   He was then seen back on 05/08/20 in HF clinic by [facility]. Still residing at [facility]. Continued w/ expressive dysphasia and speech therapy. No other significant deficits. He had been getting his cardiac meds regularly. BP was elevated in clinic at 150/92 and reportedly had been elevated at SNF. Denied dyspnea, orthopnea, PND and no weightt gain. He did report having some occasional mild dizziness w/ positional changes, but resolved quickly. No syncope/ near syncope.    Recently returned to HF Clinic for HF follow up 08/04/20 with APP. Overall was feeling fine but still struggling with dizziness every time he stands for transfers, stated this has been on-going since his CVA. No falls. Denied increasing SOB, CP, dizziness, edema, or PND/Orthopnea. Appetite was ok. No fever or chills. Weight at home was 206 pounds. Reported taking all medications. Still resides at [facility], still with expressive aphasia, has PT and SLP at facility. No drugs or ETOH use.  Today he returns to HF clinic for pharmacist medication titration. At last visit with APP, Farxiga 10  mg daily was initiated and furosemide was decreased to 20 mg daily. Unfortunately, this change was not made at the SNF. I reviewed medication list from [place] today and found several discrepancies from our medication list in Epic. The patient has been taking losartan 12.5 mg daily and was never changed to [employer]. He was also only taking spironolactone 12.5 mg daily and Marcelline Deist was  never initiated. Medication list updated today. Overall he is feeling well today. Still has dizziness from transfers, but this appears to be related to CVA, not medications. No chest pain or palpitations. No increased SOB. Still uses wheelchair. Weight is increased 7 lbs from last visit. No [place], PND or orthopnea.  BP 154/94 in clinic.   HF Medications: Carvedilol 3.125 mg BID Entresto 24/26 mg BID - not taking, SNF list has him taking losartan 12.5 mg daily Spironolactone 25 mg daily - taking 12.5 mg daily Farxiga 10 mg daily - Not taking Furosemide 20 mg daily  Has the patient been experiencing any side effects to the medications prescribed?  no  Does the patient have any problems obtaining medications due to transportation or finances?   Has traditional [place] Medicaid. Receives medications from SNF, see above for medication list discrepancies.   Understanding of regimen: good Understanding of indications: good Potential of compliance: excellent Patient understands to avoid NSAIDs. Patient understands to avoid decongestants.    Pertinent Lab Values: 08/04/20: Serum creatinine 1.09, BUN 11, Potassium 4.1, Sodium 134 06/19/20: Serum creatinine 1.01, BUN 11, Potassium 4.3, Sodium 132 05/08/20: Serum creatinine 1.29, BUN 17, Potassium 4.3, Sodium 131   Vital Signs:  Weight: 214.6 lbs (last clinic weight: 207.6 lbs) Blood pressure: 154/94 mmHg  Heart rate: 80 bpm   Assessment/Plan: 1. Chronic systolic HF due to NICM - echo 05/2018 EF 15% - cath no CAD 05/2018  - Echo 09/2019 EF 15-20%, RV severely reduced (this was in setting of poor med compliance and cocaine use. Compliance now improved and no further drug use now that he is in SNF post CVA) - Functional status difficult to ascertain given he primarily uses wheelchair but he denies resting dyspnea and no dyspnea w/ transfers.  - Appears euvolemic on exam but weight up 7 lbs in clinic.  - Continue furosemide 20 mg daily  - Continue  carvedilol 3.125 mg BID - Stop losartan and start Entresto 24/26 mg BID. Repeat BMET in 2 weeks at SNF. - Continue spironolactone 12.5 mg daily  - Return to pharmacy clinic in 4 weeks to try and add Farxiga or increase spironolactone again.  - plan to repeat echo after meds are further optimized, 10/2020 - per neuro continue Eliquis for secondary prevention of cardiac thrombus formation, as long as EF remains < 30%   2. 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 - Continues w/ expressive dysphasia>>getting speech therapy - Per neuro continue Eliquis for secondary prevention of cardiac thrombus formation, as long as EF remains < 30%. - Continue Eliquis per neuro. No bleeding issues.   3. DM2 - Continue metformin   4. Former Polysubstance abuse  - no further use now residing at [facility]  Karle Plumber, PharmD, BCPS, BCCP, CPP Heart Failure Clinic Pharmacist [phone]

## 2020-09-01 ENCOUNTER — Ambulatory Visit (HOSPITAL_COMMUNITY)
Admission: RE | Admit: 2020-09-01 | Discharge: 2020-09-01 | Disposition: A | Discharge status: Home / Self Care | Payer: Medicaid Other | Source: Ambulatory Visit | Attending: Internal Medicine | Admitting: Internal Medicine

## 2020-09-01 ENCOUNTER — Other Ambulatory Visit: Payer: Self-pay

## 2020-09-01 VITALS — BP 154/94 | HR 80 | Wt 214.6 lb

## 2020-09-01 DIAGNOSIS — Z09 Encounter for follow-up examination after completed treatment for conditions other than malignant neoplasm: Secondary | ICD-10-CM | POA: Diagnosis not present

## 2020-09-01 DIAGNOSIS — Z7901 Long term (current) use of anticoagulants: Secondary | ICD-10-CM | POA: Diagnosis not present

## 2020-09-01 DIAGNOSIS — R42 Dizziness and giddiness: Secondary | ICD-10-CM | POA: Diagnosis present

## 2020-09-01 DIAGNOSIS — Z79899 Other long term (current) drug therapy: Secondary | ICD-10-CM | POA: Diagnosis not present

## 2020-09-01 DIAGNOSIS — F17210 Nicotine dependence, cigarettes, uncomplicated: Secondary | ICD-10-CM | POA: Diagnosis not present

## 2020-09-01 DIAGNOSIS — Z7984 Long term (current) use of oral hypoglycemic drugs: Secondary | ICD-10-CM | POA: Insufficient documentation

## 2020-09-01 DIAGNOSIS — I5022 Chronic systolic (congestive) heart failure: Secondary | ICD-10-CM | POA: Diagnosis not present

## 2020-09-01 DIAGNOSIS — E118 Type 2 diabetes mellitus with unspecified complications: Secondary | ICD-10-CM | POA: Insufficient documentation

## 2020-09-01 DIAGNOSIS — Z8673 Personal history of transient ischemic attack (TIA), and cerebral infarction without residual deficits: Secondary | ICD-10-CM | POA: Diagnosis not present

## 2020-09-01 DIAGNOSIS — I428 Other cardiomyopathies: Secondary | ICD-10-CM | POA: Diagnosis not present

## 2020-09-01 MED ORDER — SPIRONOLACTONE 25 MG PO TABS: 12.5000 mg | ORAL_TABLET | Freq: Every day | ORAL | 3 refills | Status: DC

## 2020-09-01 NOTE — Patient Instructions (Addendum)
It was a pleasure seeing you today!  MEDICATIONS: -We are changing your medications today -Stop Losartan and start Entresto 24/26 mg twice daily -Call if you have questions about your medications.   NEXT APPOINTMENT: Return to clinic in 1 month with Pharmacy Clinic  In general, to take care of your heart failure: -Limit your fluid intake to 2 Liters (half-gallon) per day.   -Limit your salt intake to ideally 2-3 grams (2000-3000 mg) per day. -Weigh yourself daily and record, and bring that "weight diary" to your next appointment.  (Weight gain of 2-3 pounds in 1 day typically means fluid weight.) -The medications for your heart are to help your heart and help you live longer.   -Please contact us before stopping any of your heart medications.  Call the clinic at [phone] with questions or to reschedule future appointments.

## 2020-09-17 NOTE — Progress Notes (Signed)
Primary Care: [facility] Primary Cardiologist: [facility] AHF: Dr. Gala Romney    HPI:  Kerry Hamilton is a 55 y.o. male with a past medial history significant for schizophrenia, tobacco/ETOH abuse, diabetes and systolic HF due to NICM with EF 15%, initially referred by Dr. Rennis Golden for further evaluation of HF.    He presented in May 2020 with progressive shortness of breath with minimal activity was found to be in congestive heart failure with elevated BNP and troponin.  Echo LVEF 15% with severely dilated LV and pseudonormalization of diastolic function.  Ultimately underwent right and left heart catheterization after diuresis, demonstrated no significant coronary disease with mildly elevated LV filling pressures and a cardiac index of 2.2.   Had initial [facility] consultation w/ Dr. Gala Romney 10/2018. Fluid status was ok. Meds were titrated. Entresto increased to 49/51 BID and spironolactone added. Unfortunately, he failed to f/u after his initial visit.   He was seen in the ED in March 2021 with SOB and CP and found to be mildly volume overloaded. This was in the setting of poor compliance w/ home furosemide and cocaine and ETOH use. HS trops were flat, 27>>28. BNP 1,169.  He was treated w/ dose of IV furosemide in the ED w/ improvement in symptoms and did not require admission. He was given new Rx for PO furosemide and instructed to f/u as outpatient. He did not immediately follow up in the [facility] but return several months later in June 2021. Was feeling fairly well, NYHA Class II. He was still drinking beer (about 6 cans a day) and still smoking cigarettes and reported ongoing cocaine use. His BP was felt too low for Entresto, so losartan was initiated.    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  No Afib detection on tele. Echo w/ EF  15-20%. No thrombus identified. Carotid duplex negative. His cardiac meds were continued spironolactone, losartan, digoxin and furosemide. Neurology started him on Eliquis. He was discharged to SNF at [facility]. Again, he was lost to f/u in the [facility].     He was then seen on 03/2020 by neurology for routine f/u and referred back to our office for ongoing cardiac care. Of note, neurology has recommended continuation of Eliquis until EF is > 30%.   He was then seen back on 05/08/20 in HF clinic by [facility]. Still residing at [facility]. Continued w/ expressive dysphasia and speech therapy. No other significant deficits. He had been getting his cardiac meds regularly. BP was elevated in clinic at 150/92 and reportedly had been elevated at SNF. Denied dyspnea, orthopnea, PND and no weightt gain. He did report having some occasional mild dizziness w/ positional changes, but resolved quickly. No syncope/ near syncope.    Recently returned to HF Clinic for HF follow up 08/04/20 with APP. Overall was feeling fine but still struggling with dizziness every time he stands for transfers, stated this has been on-going since his CVA. No falls. Denied increasing SOB, CP, dizziness, edema, or PND/Orthopnea. Appetite was ok. No fever or chills. Weight at home was 206 pounds. Reported taking all medications. Still resides at [facility], still with expressive aphasia, has PT and SLP at facility. No drugs or ETOH use.  Recently returned to HF Clinic for pharmacist medication titration 09/01/20. At previous visit with APP, Farxiga 10  mg daily was initiated and furosemide was decreased to 20 mg daily. Unfortunately, this change was not made at the SNF. Medication list reviewed in clinic from [place] and found several discrepancies from medication list in Epic. The patient had been taking losartan 12.5 mg daily and was never changed to [employer]. He was also only taking spironolactone 12.5 mg daily and  Marcelline Deist was never initiated. Medication list updated today. Overall he was feeling well. Still had dizziness from transfers, but this appeared to be related to CVA, not medications. No chest pain or palpitations. No increased SOB. Still using wheelchair. Weight had increased 7 lbs from last visit. No [place], PND or orthopnea.  BP 154/94 in clinic.  Today he returns to HF clinic for pharmacist medication titration. At last visit with pharmacy clinic, losartan was discontinued and Entresto 24/26 mg BID was initiated. Overall he is feeling well today. Still gets dizzy upon transfers and when walking. Also gets dizzy and SOB when making his bed. This is not new and is related to his CVA. Weight decreased 4 lbs from last clinic visit. No Favian, PND or orthopnea.    HF Medications: Carvedilol 3.125 mg BID Entresto 24/26 mg BID  Spironolactone 12.5 mg daily  Furosemide 20 mg daily  Has the patient been experiencing any side effects to the medications prescribed?  no  Does the patient have any problems obtaining medications due to transportation or finances?   Has traditional [place] Medicaid. Receives medications from SNF.  Understanding of regimen: good Understanding of indications: good Potential of compliance: excellent Patient understands to avoid NSAIDs. Patient understands to avoid decongestants.    Pertinent Lab Values: 08/04/20: Serum creatinine 1.09, BUN 11, Potassium 4.1, Sodium 134 06/19/20: Serum creatinine 1.01, BUN 11, Potassium 4.3, Sodium 132 05/08/20: Serum creatinine 1.29, BUN 17, Potassium 4.3, Sodium 131  09/30/20: BMET today pending  Vital Signs:  Weight: 210.6 lbs (last clinic weight: 214.6 lbs) Blood pressure: 120/78 mmHg  Heart rate: 77 bpm   Assessment/Plan: 1. Chronic systolic HF due to NICM - echo 05/2018 EF 15% - cath no CAD 05/2018  - Echo 09/2019 EF 15-20%, RV severely reduced (this was in setting of poor med compliance and cocaine use. Compliance now improved and no further  drug use now that he is in SNF post CVA) - Functional status difficult to ascertain given he primarily uses wheelchair but he denies resting dyspnea and no dyspnea w/ transfers.  - BMET today pending - Appears euvolemic on exam.   - Continue furosemide 20 mg daily  - Continue carvedilol 3.125 mg BID - Continue Entresto 24/26 mg BID - Increase spironolactone to 25 mg daily. Repeat BMET in 1 week at SNF.  - Return to HF clinic in 1 month with Dr. Emilio Aspen.  - per neuro continue Eliquis for secondary prevention of cardiac thrombus formation, as long as EF remains < 30%   2. 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 - Continues w/ expressive dysphasia>>getting speech therapy - Per neuro continue Eliquis for secondary prevention of cardiac thrombus formation, as long as EF remains < 30%. - Continue Eliquis per neuro. No bleeding issues.   3. DM2 - Continue metformin   4. Former Polysubstance abuse  - no further use now residing at [facility]  Karle Plumber, PharmD, BCPS, BCCP, CPP Heart Failure Clinic Pharmacist [phone]

## 2020-09-30 ENCOUNTER — Telehealth (HOSPITAL_COMMUNITY): Payer: Self-pay | Admitting: Pharmacist

## 2020-09-30 ENCOUNTER — Other Ambulatory Visit: Payer: Self-pay

## 2020-09-30 ENCOUNTER — Ambulatory Visit (HOSPITAL_COMMUNITY)
Admission: RE | Admit: 2020-09-30 | Discharge: 2020-09-30 | Disposition: A | Discharge status: Home / Self Care | Payer: Medicaid Other | Source: Ambulatory Visit | Attending: Cardiology | Admitting: Cardiology

## 2020-09-30 VITALS — BP 120/78 | HR 77 | Wt 210.6 lb

## 2020-09-30 DIAGNOSIS — I428 Other cardiomyopathies: Secondary | ICD-10-CM | POA: Diagnosis not present

## 2020-09-30 DIAGNOSIS — F17210 Nicotine dependence, cigarettes, uncomplicated: Secondary | ICD-10-CM | POA: Insufficient documentation

## 2020-09-30 DIAGNOSIS — I69321 Dysphasia following cerebral infarction: Secondary | ICD-10-CM | POA: Insufficient documentation

## 2020-09-30 DIAGNOSIS — E119 Type 2 diabetes mellitus without complications: Secondary | ICD-10-CM | POA: Diagnosis not present

## 2020-09-30 DIAGNOSIS — I5022 Chronic systolic (congestive) heart failure: Secondary | ICD-10-CM | POA: Insufficient documentation

## 2020-09-30 DIAGNOSIS — Z7901 Long term (current) use of anticoagulants: Secondary | ICD-10-CM | POA: Diagnosis not present

## 2020-09-30 LAB — BASIC METABOLIC PANEL
Anion gap: 9 (ref 5–15)
BUN: 22 mg/dL — ABNORMAL HIGH (ref 6–20)
CO2: 21 mmol/L — ABNORMAL LOW (ref 22–32)
Calcium: 9.3 mg/dL (ref 8.9–10.3)
Chloride: 102 mmol/L (ref 98–111)
Creatinine, Ser: 1.44 mg/dL — ABNORMAL HIGH (ref 0.61–1.24)
GFR, Estimated: 58 mL/min — ABNORMAL LOW (ref >60)
Glucose, Bld: 303 mg/dL — ABNORMAL HIGH (ref 70–99)
Potassium: 4.2 mmol/L (ref 3.5–5.1)
Sodium: 132 mmol/L — ABNORMAL LOW (ref 135–145)

## 2020-09-30 MED ORDER — SPIRONOLACTONE 25 MG PO TABS: 25.0000 mg | ORAL_TABLET | Freq: Every day | ORAL | 3 refills | Status: AC

## 2020-09-30 MED ORDER — FUROSEMIDE 20 MG PO TABS: 10.0000 mg | ORAL_TABLET | Freq: Every day | ORAL | 3 refills | Status: AC

## 2020-09-30 NOTE — Telephone Encounter (Signed)
Called [facility] and requested medication list for patient. Report was faxed over after patient visit completed. Reviewed medication list. Only discrepancy from current medication list in Epic is that patient is taking Jardiance 10 mg daily. Per the report, patient will take Jardiance 10 mg daily for 1 month, then will increase to Jardiance 25 mg daily on 10/15/20. Will update medication list to reflect current Jardiance prescription.    Karle Plumber, PharmD, BCPS, BCCP, CPP Heart Failure Clinic Pharmacist [phone]

## 2020-09-30 NOTE — Patient Instructions (Addendum)
It was a pleasure seeing you today!  MEDICATIONS: -We are changing your medications today -Increase spironolactone to 25 mg (1 tablet) daily. Repeat basic metabolic panel in 1 week. -Call if you have questions about your medications.  NEXT APPOINTMENT: Return to clinic in 1 month with Dr. Gala Romney.  In general, to take care of your heart failure: -Limit your fluid intake to 2 Liters (half-gallon) per day.   -Limit your salt intake to ideally 2-3 grams (2000-3000 mg) per day. -Weigh yourself daily and record, and bring that "weight diary" to your next appointment.  (Weight gain of 2-3 pounds in 1 day typically means fluid weight.) -The medications for your heart are to help your heart and help you live longer.   -Please contact us before stopping any of your heart medications.  Call the clinic at [phone] with questions or to reschedule future appointments.

## 2020-09-30 NOTE — Telephone Encounter (Signed)
Labs returned after patient visit. Scr increased to 1.44. Will hold furosemide for 2 days, then decrease furosemide to 10 mg daily. Repeat BMET in 1 week. Called [facility] personally to inform them of medication changes. Medication list updated.  Karle Plumber, PharmD, BCPS, BCCP, CPP Heart Failure Clinic Pharmacist [phone]'

## 2020-10-16 ENCOUNTER — Encounter: Payer: Self-pay | Admitting: Adult Health

## 2020-10-16 ENCOUNTER — Other Ambulatory Visit: Payer: Self-pay

## 2020-10-16 ENCOUNTER — Ambulatory Visit: Payer: Medicaid Other | Admitting: Adult Health

## 2020-10-16 VITALS — BP 122/82 | HR 80

## 2020-10-16 DIAGNOSIS — I63411 Cerebral infarction due to embolism of right middle cerebral artery: Secondary | ICD-10-CM | POA: Diagnosis not present

## 2020-10-16 MED ORDER — MECLIZINE HCL 12.5 MG PO TABS: 12.5000 mg | ORAL_TABLET | Freq: Three times a day (TID) | ORAL | 5 refills | Status: DC | PRN

## 2020-10-16 NOTE — Patient Instructions (Addendum)
Continue Eliquis (apixaban) daily  and atorvastatin (unsure if currently on, if not, please restart)  for secondary stroke prevention  Start Meclizine 12.5mg  three times daily to see if this helps with dizziness. Please monitor and if after 1 week, limited benefit, please increase dose (will defer to SNF)  Continue to follow up with PCP regarding cholesterol, blood pressure and diabetes management  Maintain strict control of hypertension with blood pressure goal below 130/90, diabetes with hemoglobin A1c goal below 7% and cholesterol with LDL cholesterol (bad cholesterol) goal below 70 mg/dL.    Please ensure medication list is sent with patient at follow up visits     Followup in the future with me in 6 months or call earlier if needed       Thank you for coming to see [place] at [facility]. I hope we have been able to provide you high quality care today.  You may receive a patient satisfaction survey over the next few weeks. We would appreciate your feedback and comments so that we may continue to improve ourselves and the health of our patients.

## 2020-10-16 NOTE — Progress Notes (Signed)
[facility] [address] [place] [address] [phone]       STROKE FOLLOW UP NOTE  Kerry Hamilton Date of Birth:  [birth year] Medical Record Number:  [id number]   Reason for Referral:  stroke follow up    SUBJECTIVE:   CHIEF COMPLAINT:  Chief Complaint  Patient presents with   Follow-up    Rm 3 here for 6 month f/u. Reports he has been doing well since his last visit. Reports some dizziness when up walking. Reports dizziness is intermittent, denies any falls. . Still resides at [facility]     HPI:   Today, 10/16/2020, Kerry Hamilton returns for 35-month stroke follow-up unaccompanied.  He continues to reside at [facility]. Continues to experience dizziness when ambulating or with position changes. Per patient, seen by cards who felt dizziness likely due to prior stroke. Occurs off/on and doesn't last long. No longer working with therapies.  Ambulation remains limited due to dizziness.  Continued aphasia which has been stable. He does good when he speaks slower and concentrates on pronunciation.  Denies new stroke/TIA symptoms. He reports being on eliquis without side effects. He is unsure if atrovastatin - no MAR list from facility. Blood pressure today 122/82. Routinely monitors at SNF and has been stable per patient.  No further concerns at this time     History provided for reference purposes only Update 04/10/2020 JM: Kerry Hamilton returns for 87-month stroke follow-up accompanied by his sister  Continues to reside at [facility]  Reports residual dizziness and speech difficulty Continues to work with speech therapy reporting continued improvement Dizziness typically occurs upon standing from a seated position and quickly subsides - due to dizziness, he limits ambulation and mainly transfers via wheelchair Denies new stroke/TIA symptoms  Compliant on Eliquis and atorvastatin -denies side effects Blood pressure today 150/86  He has  not had follow-up with cardiology since hospital discharge  No new concerns at this time  Initial visit 12/06/2019 JM: Kerry Hamilton is being seen for hospital follow-up accompanied by his sister. He continues to reside at [facility] currently receiving PT/OT/SLP. He reports improvement since discharge. He continues to complain of dizziness, imbalance, headache, and speech difficulty. Dizziness and headaches occur with standing and ambulation which was reported during inpatient therapy sessions. He tranfers via w/c due to dizziness but ambulates with RW with therapy. Denies new or worseing stroke/TIA symptoms. Remains on eliquis without bleeding or bruising. Remains on atrovastatin without myalgias. Blood pressure today 143/81.  Obviously abstinence from EtOH, tobacco and cocaine.  No further concerns at this time.  Stroke admission 10/12/2019 Kerry Hamilton is a 55 y.o. male with history of  depression, DM2, CHF with EF of 15%, EtOH use, cocaine use who is admitted 10/12/2019 with SOB and chest pain. On Ativan PRN per CIWA protocol. On 10/16/2019 noted to have difficulty with language (garbled speech and not following commands).  Personally reviewed pertinent hospitalization progress notes, lab work and imaging with summary provided.  Evaluated by Dr. Roda Shutters with stroke work-up revealing right MCA infarct embolic most likely secondary to cardiomyopathy with low EF 15 to 20% in setting of EtOH and cocaine use.  Recommend initiate anticoagulation 7 to 10 days post stroke given severe cardiomyopathy with stroke and Eliquis initiated on 9/28.  Advise AC can be discontinued once EF > 30 to 35%.  Once off AC, recommend 30-day cardiac event monitor or loop recorder to rule out A. fib.  Discussed  EtOH, tobacco and cocaine cessation with counseling provided.  LDL 102 and initiate atorvastatin 40 mg daily.  Elevated ALT and advised monitor LFTs outpatient.  Controlled DM with A1c 6.3.  Evaluated by therapy and  recommended discharge to CIR but eventually discharged to SNF for extensive rehab for residual aphasia, cognitive impairment, dizziness and gait impairment.  Stroke:   R MCA infarct embolic most likely secondary to cardiomyopathy w/ low EF in setting of alcohol and cocaine use CT head acute/subacute R poster MCA infarct w/ mild local mass effect. MRI  Moderate R frontoparietal, posterior R MCA infarct MRA  Unremarkable  Carotid Doppler  B ICA 1-39% stenosis, VAs antegrade  2D Echo EF 15-20%. No source of embolus. LA severely dilated LDL 102 HgbA1c 6.3 UDS neg VTE prophylaxis - Lovenox 40 mg sq daily  No antithrombotic prior to admission, now on aspirin 81 mg daily. From neuro standpoint, recommend anticoagulation in 7-10 days post stroke (9/27-9/30) given severe cardiomyopathy with stroke.  Therapy recommendations:  CIR -->SNF Disposition:  SNF      ROS:   14 system review of systems performed and negative with exception of those listed in HPI  PMH:  Past Medical History:  Diagnosis Date   Cocaine abuse (HCC)    CVA (cerebral vascular accident) (HCC) 09/2019   Depression    Diabetes mellitus without complication (HCC)    Systolic congestive heart failure (HCC)    Tobacco abuse     PSH:  Past Surgical History:  Procedure Laterality Date   RIGHT/LEFT HEART CATH AND CORONARY ANGIOGRAPHY N/A 06/13/2018   Procedure: RIGHT/LEFT HEART CATH AND CORONARY ANGIOGRAPHY;  Surgeon: [place], Peter M, MD;  Location: [facility] INVASIVE CV LAB;  Service: Cardiovascular;  Laterality: N/A;    Social History:  Social History   Socioeconomic History   Marital status: Single    Spouse name: Not on file   Number of children: Not on file   Years of education: Not on file   Highest education level: Not on file  Occupational History   Not on file  Tobacco Use   Smoking status: Former    Packs/day: 1.00    Types: Cigarettes, Cigars   Smokeless tobacco: Never  Vaping Use   Vaping Use: Never used   Substance and Sexual Activity   Alcohol use: Yes    Alcohol/week: 6.0 standard drinks    Types: 6 Cans of beer per week    Comment: daily   Drug use: Yes    Types: "Crack" cocaine   Sexual activity: Not Currently  Other Topics Concern   Not on file  Social History Narrative   Not on file   Social Determinants of Health   Financial Resource Strain: Not on file  Food Insecurity: Not on file  Transportation Needs: Not on file  Physical Activity: Not on file  Stress: Not on file  Social Connections: Not on file  Intimate Partner Violence: Not on file    Family History:  Family History  Problem Relation Age of Onset   CAD Paternal Grandmother    Diabetes Mellitus II Neg Hx     Medications:   Current Outpatient Medications on File Prior to Visit  Medication Sig Dispense Refill   apixaban (ELIQUIS) 5 MG TABS tablet Take 1 tablet (5 mg total) by mouth 2 (two) times daily. 60 tablet 0   carvedilol (COREG) 3.125 MG tablet Take 3.125 mg by mouth 2 (two) times daily with a meal.     docusate  sodium (COLACE) 100 MG capsule Take 100 mg by mouth 2 (two) times daily.     empagliflozin (JARDIANCE) 10 MG TABS tablet Take 10 mg by mouth daily. Will increase to 25 mg daily 10/15/20     FLUoxetine (PROZAC) 20 MG capsule Take 1 capsule (20 mg total) by mouth daily. 30 capsule 0   furosemide (LASIX) 20 MG tablet Take 0.5 tablets (10 mg total) by mouth daily. 45 tablet 3   metFORMIN (GLUCOPHAGE) 500 MG tablet Take by mouth 2 (two) times daily with a meal.     pantoprazole (PROTONIX) 40 MG tablet TAKE 1 TABLET(40 MG) BY MOUTH DAILY 90 tablet 0   polyethylene glycol (MIRALAX / GLYCOLAX) 17 g packet Take 17 g by mouth daily as needed for mild constipation, moderate constipation or severe constipation.     QUEtiapine (SEROQUEL) 50 MG tablet Take 1 tablet (50 mg total) by mouth at bedtime. 30 tablet 0   sacubitril-valsartan (ENTRESTO) 24-26 MG Take 1 tablet by mouth 2 (two) times daily. 60 tablet 3    spironolactone (ALDACTONE) 25 MG tablet Take 1 tablet (25 mg total) by mouth daily. 90 tablet 3   thiamine 100 MG tablet Take 1 tablet (100 mg total) by mouth daily. 30 tablet 0   No current facility-administered medications on file prior to visit.    Allergies:  No Known Allergies    OBJECTIVE:  Physical Exam  Vitals:   10/16/20 1321  BP: 122/82  Pulse: 80  SpO2: 96%   There is no height or weight on file to calculate BMI. No results found.  General: well developed, well nourished,  very [name], seated, in no evident distress Head: head normocephalic and atraumatic.   Neck: supple with no carotid or supraclavicular bruits Cardiovascular: regular rate and rhythm, no murmurs Musculoskeletal: no deformity Skin:  no rash/petichiae Vascular:  Normal pulses all extremities   Neurologic Exam Mental Status: Awake and fully alert.   Mild to moderate expressive > mild receptive aphasia.  Mild dysarthria.  Able to follow simple step commands.  Oriented to place and time. Recent and remote memory appears intact. Attention span, concentration and fund of knowledge appropriate during visit. Mood and affect appropriate and cooperative with exam.  Cranial Nerves: Pupils equal, briskly reactive to light. Extraocular movements full without nystagmus.  Not consistent with blinking to visual threat bilaterally. Hearing intact. Facial sensation intact. Face, tongue, palate moves normally and symmetrically.  Motor: Normal bulk and tone. Normal strength in all tested extremity muscles. Sensory.:  Intact to light touch, vibratory and pinprick sensation throughout Coordination: Rapid alternating movements normal in all extremities. Finger-to-nose and heel-to-shin performed accurately bilaterally. Gait and Station: Deferred  Reflexes: 1+ and symmetric. Toes downgoing.         ASSESSMENT: Kerry Hamilton is a 54 y.o. year old male presented initially on 10/12/2019  with SOB an chest pain developing difficulty with language and not following commands on 10/16/2019 with stroke work-up revealing right MCA infarct embolic most likely secondary to cardiomyopathy with low EF 15 to 20% in setting of EtOH and cocaine use. Vascular risk factors include substance abuse, tobacco use, medication noncompliance, DM, HTN, HLD, and CHF with a EF 15 to 20%.      PLAN:  R MCA stroke :  Residual deficit: Expressive> receptive aphasia, dysarthria, and dizziness  dizziness limiting ambulation and activity therefore recommend trialing meclizine 12.5 mg 3 times daily.  Request SNF monitor and increase dose if needed Continue  Eliquis (apixaban) daily  and atorvastatin for secondary stroke prevention.  Discussed secondary stroke prevention measures and importance of close PCP follow up for aggressive stroke risk factor management   CHF 2/2 cardiomyopathy: Due to alcohol and cocaine use Continuation of Eliquis in setting of recent stroke Per Dr. Pearlean Brownie, recommend continuation of Eliquis until EF > 30%.  If Eliquis discontinued in the future, Dr. Pearlean Brownie recommends cardiac monitor or loop recorder to assess for atrial fibrillation Scheduled for echocardiogram and cards f/u visit on 11/06/2020  Substance abuse: Reports complete abstinence currently residing in SNF.  Discussed importance of continued abstinence in setting of CHF with cardiomyopathy and recent stroke  HTN: BP goal <130/90.  Well-controlled on current regimen per cardiology  HLD: LDL goal <70.  Lipid panel monitored at facility.  Continue atorvastatin managed and monitored by facility  DMII: A1c goal<7.0.  A1c monitored at facility.  On Metformin per PCP    Follow up in 6 months or call earlier if needed   CC:  [facility] provider: Dr. Pearlean Brownie Medicine, [facility]    I spent 32 minutes of face-to-face and non-face-to-face time with patient.  This included previsit chart review, lab review, study review,  order entry, electronic health record documentation, patient education regarding prior stroke and etiology, residual deficits, importance of managing stroke risk factors and continued follow-up with cardiology and answered all other questions to patient satisfaction  Ihor [place], [facility]  [facility] [address] [place], [place] [address]  Phone [phone] Fax [phone] Note: This document was prepared with digital dictation and possible smart phrase technology. Any transcriptional errors that result from this process are unintentional.

## 2020-10-17 NOTE — Progress Notes (Signed)
I agree with the above plan 

## 2020-11-06 ENCOUNTER — Ambulatory Visit (HOSPITAL_COMMUNITY)
Admission: RE | Admit: 2020-11-06 | Discharge: 2020-11-06 | Disposition: A | Discharge status: Home / Self Care | Payer: Medicaid Other | Source: Ambulatory Visit | Attending: Internal Medicine | Admitting: Internal Medicine

## 2020-11-06 ENCOUNTER — Other Ambulatory Visit: Payer: Self-pay

## 2020-11-06 ENCOUNTER — Encounter (HOSPITAL_COMMUNITY): Payer: Self-pay | Admitting: Internal Medicine

## 2020-11-06 ENCOUNTER — Ambulatory Visit (HOSPITAL_BASED_OUTPATIENT_CLINIC_OR_DEPARTMENT_OTHER)
Admission: RE | Admit: 2020-11-06 | Discharge: 2020-11-06 | Disposition: A | Discharge status: Home / Self Care | Payer: Medicaid Other | Source: Ambulatory Visit

## 2020-11-06 VITALS — BP 110/80 | HR 74 | Wt 208.2 lb

## 2020-11-06 DIAGNOSIS — I10 Essential (primary) hypertension: Secondary | ICD-10-CM | POA: Diagnosis not present

## 2020-11-06 DIAGNOSIS — R0602 Shortness of breath: Secondary | ICD-10-CM | POA: Insufficient documentation

## 2020-11-06 DIAGNOSIS — I5042 Chronic combined systolic (congestive) and diastolic (congestive) heart failure: Secondary | ICD-10-CM | POA: Diagnosis present

## 2020-11-06 DIAGNOSIS — R0600 Dyspnea, unspecified: Secondary | ICD-10-CM | POA: Insufficient documentation

## 2020-11-06 DIAGNOSIS — I340 Nonrheumatic mitral (valve) insufficiency: Secondary | ICD-10-CM | POA: Diagnosis not present

## 2020-11-06 DIAGNOSIS — R42 Dizziness and giddiness: Secondary | ICD-10-CM | POA: Insufficient documentation

## 2020-11-06 DIAGNOSIS — I5022 Chronic systolic (congestive) heart failure: Secondary | ICD-10-CM

## 2020-11-06 DIAGNOSIS — R4182 Altered mental status, unspecified: Secondary | ICD-10-CM | POA: Insufficient documentation

## 2020-11-06 DIAGNOSIS — I517 Cardiomegaly: Secondary | ICD-10-CM | POA: Diagnosis not present

## 2020-11-06 DIAGNOSIS — F1910 Other psychoactive substance abuse, uncomplicated: Secondary | ICD-10-CM | POA: Insufficient documentation

## 2020-11-06 DIAGNOSIS — I5030 Unspecified diastolic (congestive) heart failure: Secondary | ICD-10-CM | POA: Diagnosis not present

## 2020-11-06 DIAGNOSIS — E119 Type 2 diabetes mellitus without complications: Secondary | ICD-10-CM | POA: Insufficient documentation

## 2020-11-06 DIAGNOSIS — I501 Left ventricular failure: Secondary | ICD-10-CM | POA: Diagnosis not present

## 2020-11-06 DIAGNOSIS — Z8673 Personal history of transient ischemic attack (TIA), and cerebral infarction without residual deficits: Secondary | ICD-10-CM | POA: Diagnosis not present

## 2020-11-06 LAB — ECHOCARDIOGRAM COMPLETE
Area-P 1/2: 2.62 cm2
Calc EF: 18.9 %
S' Lateral: 5.75 cm
Single Plane A2C EF: 16.6 %
Single Plane A4C EF: 25 %

## 2020-11-06 NOTE — Patient Instructions (Signed)
Following orders were sent back to [facility] per Dr Gala Romney:  Continue current HF meds Dizziness likely due to CVA--> follow up with neurology Place compression hose daily F/U in 4 months

## 2020-11-06 NOTE — Progress Notes (Signed)
  Echocardiogram 2D Echocardiogram has been performed.  Janalyn Harder 11/06/2020, 10:44 AM

## 2020-11-06 NOTE — Progress Notes (Signed)
ADVANCED HF CLINIC PROGRESS NOTE Primary Care: [facility] Primary Cardiologist: Hilty AHF: Dr. Gala Romney    HPI: Kerry Hamilton is a 55 y.o. male with a past medial history significant for schizophrenia, tobacco/ETOH abuse, diabetes and systolic HF due to NICM with EF 15%.  Hospitalized in 5/20 with acute HF. Echo LVEF 15%  R/L cath - No CAD mildly elevated LV filling pressures. CI 2.2    Seen in ED 3/21 with recurrent HF in setting of poor compliance w/ home lasix and cocaine and ETOH use. He was treated w/ dose of IV Lasix in the ED w/ improvement in symptoms and did not require admission.  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  No Afib detected.  Echo w/ EF 15-20%. No thrombus identified. Carotid duplex negative. His cardiac meds were continued spiro, losartan, dig and lasix. Neurology started him on Eliquis. He was discharged to SNF at [facility]. Again, he was lost to f/u in the [facility].    Seen 3/22 by neurology for routine f/u and referred back to our office for ongoing cardiac care. Of note, neurology has recommended continuation of Eliquis until EF is > 30%.   Here for f/u. Still at [facility]. Feels really good. Denies CP, SOB, orthopnea, edema or PND. Complains with dizziness when he stands. Taking 10mg  lasix daily.    Cardiac Studies: - Echo 5/20 EF 15%. - LHC (5/20): no CAD.  - Echo (9/21): EF 15-20%, RV severely reduced (this was in setting of poor med compliance and cocaine use.   Past Medical History:  Diagnosis Date   Cocaine abuse (HCC)    CVA (cerebral vascular accident) (HCC) 09/2019   Depression    Diabetes mellitus without complication (HCC)    Systolic congestive heart failure (HCC)    Tobacco abuse    Current Outpatient Medications  Medication Sig Dispense Refill    apixaban (ELIQUIS) 5 MG TABS tablet Take 1 tablet (5 mg total) by mouth 2 (two) times daily. 60 tablet 0   atorvastatin (LIPITOR) 10 MG tablet Take 10 mg by mouth daily.     carvedilol (COREG) 3.125 MG tablet Take 3.125 mg by mouth 2 (two) times daily with a meal.     docusate sodium (COLACE) 100 MG capsule Take 100 mg by mouth 2 (two) times daily.     empagliflozin (JARDIANCE) 25 MG TABS tablet Take by mouth daily.     FLUoxetine (PROZAC) 20 MG capsule Take 1 capsule (20 mg total) by mouth daily. 30 capsule 0   furosemide (LASIX) 20 MG tablet Take 0.5 tablets (10 mg total) by mouth daily. 45 tablet 3   Insulin Aspart (NOVOLOG FLEXPEN [place]) Inject into the skin. PT on sliding scale insulin     LORazepam (ATIVAN) 0.5 MG tablet Take 0.5 mg by mouth daily.     meclizine (ANTIVERT) 12.5 MG tablet Take 12.5 mg by mouth 3 (three) times daily.     metFORMIN (GLUCOPHAGE) 500 MG tablet Take by mouth 2 (two) times daily with a meal.     pantoprazole (PROTONIX) 40 MG tablet TAKE 1 TABLET(40 MG) BY MOUTH DAILY 90 tablet 0   polyethylene glycol (MIRALAX / GLYCOLAX) 17 g packet Take 17 g by mouth daily as needed for mild constipation, moderate constipation or severe constipation.  QUEtiapine (SEROQUEL) 25 MG tablet Take 25 mg by mouth at bedtime. 1/2 tab     sacubitril-valsartan (ENTRESTO) 24-26 MG Take 1 tablet by mouth 2 (two) times daily. 60 tablet 3   spironolactone (ALDACTONE) 25 MG tablet Take 1 tablet (25 mg total) by mouth daily. 90 tablet 3   thiamine 100 MG tablet Take 1 tablet (100 mg total) by mouth daily. 30 tablet 0   No current facility-administered medications for this encounter.   No Known Allergies  Social History   Socioeconomic History   Marital status: Single    Spouse name: Not on file   Number of children: Not on file   Years of education: Not on file   Highest education level: Not on file  Occupational History   Not on file  Tobacco Use   Smoking status: Former     Packs/day: 1.00    Types: Cigarettes, Cigars   Smokeless tobacco: Never  Vaping Use   Vaping Use: Never used  Substance and Sexual Activity   Alcohol use: Yes    Alcohol/week: 6.0 standard drinks    Types: 6 Cans of beer per week    Comment: daily   Drug use: Yes    Types: "Crack" cocaine   Sexual activity: Not Currently  Other Topics Concern   Not on file  Social History Narrative   Not on file   Social Determinants of Health   Financial Resource Strain: Not on file  Food Insecurity: Not on file  Transportation Needs: Not on file  Physical Activity: Not on file  Stress: Not on file  Social Connections: Not on file  Intimate Partner Violence: Not on file   Family History  Problem Relation Age of Onset   CAD Paternal Grandmother    Diabetes Mellitus II Neg Hx    BP 110/80   Pulse 74   Wt 94.4 kg (208 lb 3.2 oz)   SpO2 96%   BMI 28.24 kg/m   Orthostatics  Sitting 130/70 Standing 120/70 - very dizzy   Wt Readings from Last 3 Encounters:  11/06/20 94.4 kg (208 lb 3.2 oz)  09/30/20 95.5 kg (210 lb 9.6 oz)  09/01/20 97.3 kg (214 lb 9.6 oz)   PHYSICAL EXAM: General:  NAD. No resp difficulty, in wheelchair HEENT: normal Neck: supple. no JVD. Carotids 2+ bilat; no bruits. No lymphadenopathy or thryomegaly appreciated. Cor: PMI nondisplaced. Regular rate & rhythm. No rubs, gallops or murmurs. Lungs: clear Abdomen: soft, nontender, nondistended. No hepatosplenomegaly. No bruits or masses. Good bowel sounds. Extremities: no cyanosis, clubbing, rash, edema Neuro: alert & orientedx3, cranial nerves grossly intact. moves all 4 extremities w/o difficulty. Affect pleasant   ASSESSMENT & PLAN:  1. Chronic systolic HF due to NICM - Echo 5/20 EF 15% - LHC (5/20): no CAD.  - Echo (9/21): EF 15-20%, RV severely reduced (this was in setting of poor med compliance and cocaine use.  - Echo today 11/06/20 EF 20-25% - Functional status difficult as he primarily uses WC but  feels much better.  - Volume status ok/mildly elevated. Continue lasix 10mg  daily  - ReDS 41%  - Continue Farxiga 10 mg daily.  - Continue Entresto 24/26 mg bid. - Continue carvedilol 3.125 bid    - Continue Spiro 25 mg daily  - Labs today  2. 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  -  Neuro. Much imprioved - Per neuro continue Eliquis for secondary prevention of cardiac thrombus formation, as long as EF remains < 30%. - Continue Eliquis per neuro. No bleeding issues.   3. DM2 - Continue Farxiga.  4. Former Polysubstance abuse  - No further use now residing at [facility].  5. HTN - BP looks good today  6. Dizziness - he is not orthostatic on exam today. Volume ok by ReDS - suspect neurogenic due to previous CVA. Defer to PT/Neurology for further management.  - place TED   Arvilla Meres, MD  11/06/20

## 2020-11-06 NOTE — Progress Notes (Signed)
ReDS Vest / Clip - 11/06/20 1200       ReDS Vest / Clip   Station Marker C    Ruler Value 30    ReDS Value Range High volume overload    ReDS Actual Value 41

## 2020-12-23 ENCOUNTER — Other Ambulatory Visit (HOSPITAL_COMMUNITY): Payer: Self-pay | Admitting: *Deleted

## 2020-12-23 ENCOUNTER — Telehealth (HOSPITAL_COMMUNITY): Payer: Self-pay

## 2020-12-23 NOTE — Telephone Encounter (Signed)
Patient is needing something for dizziness.  Please send to [facility].

## 2020-12-23 NOTE — Telephone Encounter (Signed)
Called pt to get more information. No answer/no vm set up.

## 2021-03-04 ENCOUNTER — Telehealth (HOSPITAL_COMMUNITY): Payer: Self-pay

## 2021-03-04 NOTE — Telephone Encounter (Signed)
Called and spoke to patient's sister Gershon Cull to confirm/remind patient of their appointment at the [facility] on 03/05/21.   Patient reminded to bring all medications and/or complete list.  Confirmed patient has transportation. Gave directions, instructed to utilize valet parking.  Confirmed appointment prior to ending call.

## 2021-03-04 NOTE — Progress Notes (Signed)
ADVANCED HF CLINIC PROGRESS NOTE Primary Care: [facility] Primary Cardiologist: Hilty AHF: Dr. Gala Romney    HPI: Kerry Hamilton is a 56 y.o. male with a past medial history significant for schizophrenia, tobacco/ETOH abuse, diabetes and systolic HF due to NICM with EF 15%.  Hospitalized in 5/20 with acute HF. Echo LVEF 15%  R/L cath - No CAD mildly elevated LV filling pressures. CI 2.2    Seen in ED 3/21 with recurrent HF in setting of poor compliance w/ home lasix and cocaine and ETOH use. He was treated w/ dose of IV Lasix in the ED w/ improvement in symptoms and did not require admission.  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 No Afib detected.  Echo w/ EF 15-20%. No thrombus identified. Carotid duplex negative. His cardiac meds were continued spiro, losartan, dig and lasix. Neurology started him on Eliquis. He was discharged to SNF at [facility]. Again, he was lost to f/u in the [facility].    Seen 3/22 by neurology for routine f/u and referred back to our office for ongoing cardiac care. Of note, neurology has recommended continuation of Eliquis until EF is > 30%.   Last seen for f/u 10/22. Volume looked okay/mildly elevated. PO lasix continued. Reported dizziness. Felt to be likely neurogenic d/t prior CVA.  Now residing in SNF.  He is here today for 4 month f/u. Denies CP, dyspnea or palpitations. Ambulates short distances but activity limited by dizziness. Reports that he staggers when walking. No presyncope or syncope. Primarily uses a wheelchair for mobility. No orthopnea, PND or leg edema. Weight checked at SNF and has been stable around 205 lb, 206 lb in clinic today. ReDS 42% >>41% at last visit. Taking all medications as prescribed.   Cardiac Studies: - Echo 5/20 EF 15%. - LHC (5/20): no CAD.  - Echo  (9/21): EF 15-20%, RV severely reduced (this was in setting of poor med compliance and cocaine use.   Past Medical History:  Diagnosis Date   Cocaine abuse (HCC)    CVA (cerebral vascular accident) (HCC) 09/2019   Depression    Diabetes mellitus without complication (HCC)    Systolic congestive heart failure (HCC)    Tobacco abuse    Current Outpatient Medications  Medication Sig Dispense Refill   apixaban (ELIQUIS) 5 MG TABS tablet Take 1 tablet (5 mg total) by mouth 2 (two) times daily. 60 tablet 0   atorvastatin (LIPITOR) 10 MG tablet Take 10 mg by mouth daily.     busPIRone (BUSPAR) 10 MG tablet Take 10 mg by mouth 2 (two) times daily.     carvedilol (COREG) 3.125 MG tablet Take 3.125 mg by mouth 2 (two) times daily with a meal.     docusate sodium (COLACE) 100 MG capsule Take 100 mg by mouth 2 (two) times daily.     empagliflozin (JARDIANCE) 25 MG TABS tablet Take by mouth daily.     FLUoxetine (PROZAC) 20 MG capsule Take 1 capsule (20 mg total) by mouth daily. 30 capsule 0   furosemide (LASIX) 20 MG tablet Take 0.5 tablets (10 mg total) by mouth daily. 45 tablet 3   Insulin Aspart (NOVOLOG FLEXPEN [place]) Inject into the skin. PT on sliding scale insulin     Insulin Glargine (BASAGLAR KWIKPEN) 100 UNIT/ML Inject 14 Units  into the skin at bedtime.     meclizine (ANTIVERT) 12.5 MG tablet Take 12.5 mg by mouth 3 (three) times daily.     metFORMIN (GLUCOPHAGE) 500 MG tablet Take by mouth 2 (two) times daily with a meal.     pantoprazole (PROTONIX) 40 MG tablet TAKE 1 TABLET(40 MG) BY MOUTH DAILY 90 tablet 0   polyethylene glycol (MIRALAX / GLYCOLAX) 17 g packet Take 17 g by mouth daily as needed for mild constipation, moderate constipation or severe constipation.     sacubitril-valsartan (ENTRESTO) 24-26 MG Take 1 tablet by mouth 2 (two) times daily. 60 tablet 3   spironolactone (ALDACTONE) 25 MG tablet Take 1 tablet (25 mg total) by mouth daily. 90 tablet 3   thiamine 100 MG tablet Take 1  tablet (100 mg total) by mouth daily. 30 tablet 0   No current facility-administered medications for this encounter.   No Known Allergies  Social History   Socioeconomic History   Marital status: Single    Spouse name: Not on file   Number of children: Not on file   Years of education: Not on file   Highest education level: Not on file  Occupational History   Not on file  Tobacco Use   Smoking status: Former    Packs/day: 1.00    Types: Cigarettes, Cigars   Smokeless tobacco: Never  Vaping Use   Vaping Use: Never used  Substance and Sexual Activity   Alcohol use: Yes    Alcohol/week: 6.0 standard drinks    Types: 6 Cans of beer per week    Comment: daily   Drug use: Yes    Types: "Crack" cocaine   Sexual activity: Not Currently  Other Topics Concern   Not on file  Social History Narrative   Not on file   Social Determinants of Health   Financial Resource Strain: Not on file  Food Insecurity: Not on file  Transportation Needs: Not on file  Physical Activity: Not on file  Stress: Not on file  Social Connections: Not on file  Intimate Partner Violence: Not on file   Family History  Problem Relation Age of Onset   CAD Paternal Grandmother    Diabetes Mellitus II Neg Hx    BP (!) 122/102 Comment: standing   Pulse 70    Wt 93.6 kg (206 lb 6.4 oz)    SpO2 98%    BMI 27.99 kg/m   Orthostatics  Sitting 120/78 HR 60 Standing 122/102 HR 70   Wt Readings from Last 3 Encounters:  03/05/21 93.6 kg (206 lb 6.4 oz)  11/06/20 94.4 kg (208 lb 3.2 oz)  09/30/20 95.5 kg (210 lb 9.6 oz)   PHYSICAL EXAM: General:  Well appearing. No resp difficulty. Sitting in wheelchair. Caregiver present. HEENT: normal Neck: supple. no JVD. Carotids 2+ bilat; no bruits. No lymphadenopathy or thryomegaly appreciated. Cor: PMI nondisplaced. Regular rate & rhythm. No rubs, gallops or murmurs. Lungs: clear Abdomen: soft, nontender, nondistended. No hepatosplenomegaly.  Extremities: no  cyanosis, clubbing, rash, trace edema Neuro: alert & orientedx3, some word finding difficulty. Affect pleasant    ASSESSMENT & PLAN:  1. Chronic systolic HF due to NICM - Echo 5/20 EF 15% - LHC (5/20): no CAD.  - Echo (9/21): EF 15-20%, RV severely reduced (this was in setting of poor med compliance and cocaine use.  - Echo 11/06/20 EF 20-25% - Functional status difficult as he primarily uses WC - Volume status okay. Continue 10 mg furosemide daily -  Continue Jardiance 10 mg daily - Continue Entresto 24/26 mg bid. - Continue carvedilol 3.125 bid    - Continue Spiro 25 mg daily  - Worry about titrating GDMT any further with significant dizziness. SBP typically 110-120 - Labs today - Not currently a good candidate for ICD. Residing in nursing home. Can reconsider down the line if overall health improves. Discussed with Dr. Gala Romney.  2. 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  - Neuro. Much imprioved - Per neuro continue Eliquis for secondary prevention of cardiac thrombus formation, as long as EF remains < 30%. - Continue Eliquis per neuro. No bleeding issues.   3. DM2 - Continue Jardiance  4. Former Polysubstance abuse  - No further use now residing at [facility].  5. HTN - BP looks good today  6. Dizziness - Orthostatics negative at last f/u and on recheck today - suspect neurogenic due to previous CVA.  - Defer to PT/Neurology for further management.   Follow-up: 4 months with Dr. Gala Romney with echo  [facility], Dalbert Garnet, PA-C  03/05/21

## 2021-03-05 ENCOUNTER — Ambulatory Visit (HOSPITAL_COMMUNITY)
Admission: RE | Admit: 2021-03-05 | Discharge: 2021-03-05 | Disposition: A | Discharge status: Home / Self Care | Payer: Medicaid Other | Source: Ambulatory Visit | Attending: Physician Assistant | Admitting: Physician Assistant

## 2021-03-05 ENCOUNTER — Other Ambulatory Visit: Payer: Self-pay

## 2021-03-05 ENCOUNTER — Encounter (HOSPITAL_COMMUNITY): Payer: Self-pay

## 2021-03-05 VITALS — BP 122/102 | HR 70 | Wt 206.4 lb

## 2021-03-05 DIAGNOSIS — F1411 Cocaine abuse, in remission: Secondary | ICD-10-CM | POA: Insufficient documentation

## 2021-03-05 DIAGNOSIS — R001 Bradycardia, unspecified: Secondary | ICD-10-CM | POA: Insufficient documentation

## 2021-03-05 DIAGNOSIS — R42 Dizziness and giddiness: Secondary | ICD-10-CM | POA: Insufficient documentation

## 2021-03-05 DIAGNOSIS — I6340 Cerebral infarction due to embolism of unspecified cerebral artery: Secondary | ICD-10-CM

## 2021-03-05 DIAGNOSIS — I10 Essential (primary) hypertension: Secondary | ICD-10-CM | POA: Diagnosis not present

## 2021-03-05 DIAGNOSIS — Z8673 Personal history of transient ischemic attack (TIA), and cerebral infarction without residual deficits: Secondary | ICD-10-CM | POA: Insufficient documentation

## 2021-03-05 DIAGNOSIS — F209 Schizophrenia, unspecified: Secondary | ICD-10-CM | POA: Insufficient documentation

## 2021-03-05 DIAGNOSIS — I4510 Unspecified right bundle-branch block: Secondary | ICD-10-CM | POA: Insufficient documentation

## 2021-03-05 DIAGNOSIS — Z87891 Personal history of nicotine dependence: Secondary | ICD-10-CM | POA: Insufficient documentation

## 2021-03-05 DIAGNOSIS — E119 Type 2 diabetes mellitus without complications: Secondary | ICD-10-CM | POA: Insufficient documentation

## 2021-03-05 DIAGNOSIS — Z7984 Long term (current) use of oral hypoglycemic drugs: Secondary | ICD-10-CM | POA: Insufficient documentation

## 2021-03-05 DIAGNOSIS — Z79899 Other long term (current) drug therapy: Secondary | ICD-10-CM | POA: Diagnosis not present

## 2021-03-05 DIAGNOSIS — Z993 Dependence on wheelchair: Secondary | ICD-10-CM | POA: Diagnosis not present

## 2021-03-05 DIAGNOSIS — Z9114 Patient's other noncompliance with medication regimen: Secondary | ICD-10-CM | POA: Diagnosis not present

## 2021-03-05 DIAGNOSIS — F1091 Alcohol use, unspecified, in remission: Secondary | ICD-10-CM | POA: Insufficient documentation

## 2021-03-05 DIAGNOSIS — Z7901 Long term (current) use of anticoagulants: Secondary | ICD-10-CM | POA: Insufficient documentation

## 2021-03-05 DIAGNOSIS — I5022 Chronic systolic (congestive) heart failure: Secondary | ICD-10-CM | POA: Insufficient documentation

## 2021-03-05 DIAGNOSIS — I110 Hypertensive heart disease with heart failure: Secondary | ICD-10-CM | POA: Insufficient documentation

## 2021-03-05 DIAGNOSIS — I428 Other cardiomyopathies: Secondary | ICD-10-CM | POA: Insufficient documentation

## 2021-03-05 LAB — BASIC METABOLIC PANEL
Anion gap: 10 (ref 5–15)
BUN: 13 mg/dL (ref 6–20)
CO2: 22 mmol/L (ref 22–32)
Calcium: 9.5 mg/dL (ref 8.9–10.3)
Chloride: 103 mmol/L (ref 98–111)
Creatinine, Ser: 1.2 mg/dL (ref 0.61–1.24)
GFR, Estimated: >60 mL/min (ref >60)
Glucose, Bld: 92 mg/dL (ref 70–99)
Potassium: 4.4 mmol/L (ref 3.5–5.1)
Sodium: 135 mmol/L (ref 135–145)

## 2021-03-05 LAB — CBC
HCT: 45.4 % (ref 39.0–52.0)
Hemoglobin: 13.9 g/dL (ref 13.0–17.0)
MCH: 25 pg — ABNORMAL LOW (ref 26.0–34.0)
MCHC: 30.6 g/dL (ref 30.0–36.0)
MCV: 81.8 fL (ref 80.0–100.0)
Platelets: 240 10*3/uL (ref 150–400)
RBC: 5.55 MIL/uL (ref 4.22–5.81)
RDW: 14 % (ref 11.5–15.5)
WBC: 4.5 10*3/uL (ref 4.0–10.5)
nRBC: 0 % (ref 0.0–0.2)

## 2021-03-05 LAB — BRAIN NATRIURETIC PEPTIDE: B Natriuretic Peptide: 68.4 pg/mL (ref 0.0–100.0)

## 2021-03-05 NOTE — Patient Instructions (Addendum)
Thank you for coming in today  You have been given a prescription for compression hose. Please wear your compression hose daily, place them on as soon as you get up in the morning and remove before you go to bed at night.  Labs were done today, if any labs are abnormal the clinic will call you  Your physician recommends that you schedule a follow-up appointment in: 4 months with Dr. Gala Romney with echocardiogram  Your physician has requested that you have an echocardiogram. Echocardiography is a painless test that uses sound waves to create images of your heart. It provides your doctor with information about the size and shape of your heart and how well your hearts chambers and valves are working. This procedure takes approximately one hour. There are no restrictions for this procedure.   At the [facility], you and your health needs are our priority. As part of our continuing mission to provide you with exceptional heart care, we have created designated Provider Care Teams. These Care Teams include your primary Cardiologist (physician) and Advanced Practice Providers (APPs- Physician Assistants and Nurse Practitioners) who all work together to provide you with the care you need, when you need it.   You may see any of the following providers on your designated Care Team at your next follow up: Dr Arvilla Meres Dr Carron Curie, NP Robbie Lis, [name] [facility] [place], [name] Karle Plumber, PharmD   Please be sure to bring in all your medications bottles to every appointment.   If you have any questions or concerns before your next appointment please send [place] a message through [facility] or call our office at [phone].    TO LEAVE A MESSAGE FOR THE NURSE SELECT OPTION 2, PLEASE LEAVE A MESSAGE INCLUDING: YOUR NAME DATE OF BIRTH CALL BACK NUMBER REASON FOR CALL**this is important as we prioritize the call backs  YOU WILL RECEIVE A CALL BACK  THE SAME DAY AS LONG AS YOU CALL BEFORE 4:00 PM

## 2021-03-05 NOTE — Progress Notes (Signed)
ReDS Vest / Clip - 03/05/21 1200       ReDS Vest / Clip   Station Marker C    Ruler Value 29    ReDS Value Range High volume overload    ReDS Actual Value 42

## 2021-03-17 ENCOUNTER — Encounter (HOSPITAL_COMMUNITY): Payer: Self-pay | Admitting: Emergency Medicine

## 2021-03-17 ENCOUNTER — Other Ambulatory Visit: Payer: Self-pay

## 2021-03-17 ENCOUNTER — Emergency Department (HOSPITAL_COMMUNITY)
Admission: EM | Admit: 2021-03-17 | Discharge: 2021-03-17 | Disposition: A | Discharge status: Skilled Nursing Facility | Payer: Medicaid Other | Attending: Emergency Medicine | Admitting: Emergency Medicine

## 2021-03-17 ENCOUNTER — Other Ambulatory Visit (HOSPITAL_COMMUNITY): Payer: Self-pay

## 2021-03-17 ENCOUNTER — Emergency Department (HOSPITAL_COMMUNITY): Payer: Medicaid Other

## 2021-03-17 DIAGNOSIS — Z794 Long term (current) use of insulin: Secondary | ICD-10-CM | POA: Diagnosis not present

## 2021-03-17 DIAGNOSIS — Z7901 Long term (current) use of anticoagulants: Secondary | ICD-10-CM | POA: Insufficient documentation

## 2021-03-17 DIAGNOSIS — I509 Heart failure, unspecified: Secondary | ICD-10-CM | POA: Insufficient documentation

## 2021-03-17 DIAGNOSIS — Z20822 Contact with and (suspected) exposure to covid-19: Secondary | ICD-10-CM | POA: Diagnosis not present

## 2021-03-17 DIAGNOSIS — R079 Chest pain, unspecified: Secondary | ICD-10-CM

## 2021-03-17 DIAGNOSIS — E119 Type 2 diabetes mellitus without complications: Secondary | ICD-10-CM | POA: Insufficient documentation

## 2021-03-17 DIAGNOSIS — J069 Acute upper respiratory infection, unspecified: Secondary | ICD-10-CM | POA: Diagnosis not present

## 2021-03-17 DIAGNOSIS — Z79899 Other long term (current) drug therapy: Secondary | ICD-10-CM | POA: Diagnosis not present

## 2021-03-17 DIAGNOSIS — Z7984 Long term (current) use of oral hypoglycemic drugs: Secondary | ICD-10-CM | POA: Diagnosis not present

## 2021-03-17 DIAGNOSIS — R059 Cough, unspecified: Secondary | ICD-10-CM | POA: Diagnosis present

## 2021-03-17 LAB — COMPREHENSIVE METABOLIC PANEL
ALT: 15 U/L (ref 0–44)
AST: 15 U/L (ref 15–41)
Albumin: 3.8 g/dL (ref 3.5–5.0)
Alkaline Phosphatase: 76 U/L (ref 38–126)
Anion gap: 6 (ref 5–15)
BUN: 16 mg/dL (ref 6–20)
CO2: 24 mmol/L (ref 22–32)
Calcium: 8.7 mg/dL — ABNORMAL LOW (ref 8.9–10.3)
Chloride: 106 mmol/L (ref 98–111)
Creatinine, Ser: 1.1 mg/dL (ref 0.61–1.24)
GFR, Estimated: >60 mL/min (ref >60)
Glucose, Bld: 170 mg/dL — ABNORMAL HIGH (ref 70–99)
Potassium: 4 mmol/L (ref 3.5–5.1)
Sodium: 136 mmol/L (ref 135–145)
Total Bilirubin: 0.6 mg/dL (ref 0.3–1.2)
Total Protein: 6.9 g/dL (ref 6.5–8.1)

## 2021-03-17 LAB — CBC WITH DIFFERENTIAL/PLATELET
Abs Immature Granulocytes: 0.02 10*3/uL (ref 0.00–0.07)
Basophils Absolute: 0 10*3/uL (ref 0.0–0.1)
Basophils Relative: 0 %
Eosinophils Absolute: 0.1 10*3/uL (ref 0.0–0.5)
Eosinophils Relative: 1 %
HCT: 41.5 % (ref 39.0–52.0)
Hemoglobin: 13.2 g/dL (ref 13.0–17.0)
Immature Granulocytes: 0 %
Lymphocytes Relative: 17 %
Lymphs Abs: 1.2 10*3/uL (ref 0.7–4.0)
MCH: 26.6 pg (ref 26.0–34.0)
MCHC: 31.8 g/dL (ref 30.0–36.0)
MCV: 83.5 fL (ref 80.0–100.0)
Monocytes Absolute: 0.6 10*3/uL (ref 0.1–1.0)
Monocytes Relative: 9 %
Neutro Abs: 5.1 10*3/uL (ref 1.7–7.7)
Neutrophils Relative %: 73 %
Platelets: 206 10*3/uL (ref 150–400)
RBC: 4.97 MIL/uL (ref 4.22–5.81)
RDW: 13.9 % (ref 11.5–15.5)
WBC: 6.9 10*3/uL (ref 4.0–10.5)
nRBC: 0 % (ref 0.0–0.2)

## 2021-03-17 LAB — URINALYSIS, ROUTINE W REFLEX MICROSCOPIC
Bacteria, UA: NONE SEEN
Bilirubin Urine: NEGATIVE
Glucose, UA: >=500 mg/dL — AB
Hgb urine dipstick: NEGATIVE
Ketones, ur: NEGATIVE mg/dL
Leukocytes,Ua: NEGATIVE
Nitrite: NEGATIVE
Protein, ur: NEGATIVE mg/dL
Specific Gravity, Urine: 1.021 (ref 1.005–1.030)
pH: 5 (ref 5.0–8.0)

## 2021-03-17 LAB — RESP PANEL BY RT-PCR (FLU A&B, COVID) ARPGX2
Influenza A by PCR: NEGATIVE
Influenza B by PCR: NEGATIVE
SARS Coronavirus 2 by RT PCR: NEGATIVE

## 2021-03-17 LAB — PROTIME-INR
INR: 1 (ref 0.8–1.2)
Prothrombin Time: 13.1 seconds (ref 11.4–15.2)

## 2021-03-17 LAB — BRAIN NATRIURETIC PEPTIDE: B Natriuretic Peptide: 108 pg/mL — ABNORMAL HIGH (ref 0.0–100.0)

## 2021-03-17 LAB — TROPONIN I (HIGH SENSITIVITY)
Troponin I (High Sensitivity): 10 ng/L (ref <18)
Troponin I (High Sensitivity): 10 ng/L (ref <18)

## 2021-03-17 MED ORDER — BENZONATATE 100 MG PO CAPS
100.0000 mg | ORAL_CAPSULE | Freq: Two times a day (BID) | ORAL | 0 refills | Status: DC | PRN
  Filled 2021-03-17: qty 21, 11d supply, fill #0

## 2021-03-17 MED ORDER — GUAIFENESIN ER 600 MG PO TB12: 600.0000 mg | ORAL_TABLET | Freq: Two times a day (BID) | ORAL | 0 refills | Status: DC | PRN

## 2021-03-17 MED ORDER — BENZONATATE 100 MG PO CAPS: 100.0000 mg | ORAL_CAPSULE | Freq: Two times a day (BID) | ORAL | 0 refills | Status: DC | PRN

## 2021-03-17 MED ORDER — GUAIFENESIN ER 600 MG PO TB12
600.0000 mg | ORAL_TABLET | Freq: Two times a day (BID) | ORAL | 0 refills | Status: DC | PRN
  Filled 2021-03-17: qty 30, 15d supply, fill #0

## 2021-03-17 NOTE — ED Notes (Signed)
[facility] made aware the pt is ready to be picked up for discharge. AVS faxed to [facility] at [facility].

## 2021-03-17 NOTE — ED Provider Notes (Signed)
[facility] EMERGENCY DEPARTMENT Provider Note   CSN: [id number] Arrival date & time: 03/17/21  1053     History  Chief Complaint  Patient presents with   Chest Pain    Kerry Hamilton is a 56 y.o. male.  Presents the emergency department with cough and chest pain.   56 year old male with past medical history of previous CVA, DM, CHF anticoagulated on Eliquis and stated to be compliant presents to the emergency department with chest congestion, productive cough and chest pain with cough.  Patient has baseline slurred speech from CVA.  Patient states that symptoms of been going on for the last 1 to 2 days.  He states his cough is productive of yellow/green phlegm, sometimes small specks of blood.  Denies any chest pain and absence of coughing.  No other significant shortness of breath.  No swelling of his lower extremities, no fever.  Denies any back or flank pain.  Home Medications Prior to Admission medications   Medication Sig Start Date End Date Taking? Authorizing Provider  apixaban (ELIQUIS) 5 MG TABS tablet Take 1 tablet (5 mg total) by mouth 2 (two) times daily. 11/01/19  Yes Welton Flakes, Mohammad Z, DO  atorvastatin (LIPITOR) 10 MG tablet Take 10 mg by mouth daily.   Yes [provider]  benzonatate (TESSALON) 100 MG capsule Take 1 capsule (100 mg total) by mouth 2 (two) times daily as needed for cough. 03/17/21  Yes Demarco Bacci, Clabe Seal, DO  busPIRone (BUSPAR) 10 MG tablet Take 15 mg by mouth 2 (two) times daily. 02/02/21  Yes [provider]  carvedilol (COREG) 3.125 MG tablet Take 3.125 mg by mouth 2 (two) times daily with a meal.   Yes [provider]  FLUoxetine (PROZAC) 20 MG capsule Take 1 capsule (20 mg total) by mouth daily. 10/10/18  Yes Aldean Baker, NP  guaiFENesin (MUCINEX) 600 MG 12 hr tablet Take 1 tablet (600 mg total) by mouth 2 (two) times daily as needed. 03/17/21  Yes Elisea Khader M, DO  Insulin Aspart (NOVOLOG FLEXPEN ) Inject 0-10 Units into  the skin See admin instructions. Per sliding scale. 71-150= 0u, 151-200= 2u, 201-250= 4u, 251-300= 6u, 301-350= 8u, 351-400= 10u   Yes [provider]  Insulin Glargine (BASAGLAR KWIKPEN) 100 UNIT/ML Inject 14 Units into the skin at bedtime.   Yes [provider]  metFORMIN (GLUCOPHAGE) 500 MG tablet Take 500 mg by mouth 2 (two) times daily with a meal.   Yes [provider]  pantoprazole (PROTONIX) 40 MG tablet TAKE 1 TABLET(40 MG) BY MOUTH DAILY Patient taking differently: Take 40 mg by mouth daily. 07/05/19  Yes Sharol Harness, Brittainy M, PA-C  polyethylene glycol (MIRALAX / GLYCOLAX) 17 g packet Take 17 g by mouth daily as needed for mild constipation, moderate constipation or severe constipation.   Yes [provider]  sacubitril-valsartan (ENTRESTO) 24-26 MG Take 1 tablet by mouth 2 (two) times daily. 06/19/20  Yes Bensimhon, Bevelyn Buckles, MD  spironolactone (ALDACTONE) 25 MG tablet Take 1 tablet (25 mg total) by mouth daily. 09/30/20  Yes Laurey Morale, MD  furosemide (LASIX) 20 MG tablet Take 0.5 tablets (10 mg total) by mouth daily. Patient not taking: Reported on 03/17/2021 09/30/20   Bensimhon, Bevelyn Buckles, MD  thiamine 100 MG tablet Take 1 tablet (100 mg total) by mouth daily. Patient not taking: Reported on 03/17/2021 11/02/19   Thalia Party, DO      Allergies    Patient has no known  allergies.    Review of Systems   Review of Systems  Constitutional:  Positive for fatigue. Negative for chills and fever.  Respiratory:  Positive for cough. Negative for shortness of breath.   Cardiovascular:  Positive for chest pain. Negative for palpitations.  Gastrointestinal:  Negative for abdominal pain, diarrhea and vomiting.  Musculoskeletal:  Negative for back pain.  Skin:  Negative for rash.  Neurological:  Negative for headaches.   Physical Exam Updated Vital Signs BP 139/80    Pulse 64    Temp 99 F (37.2 C) (Oral)    Resp 20    Ht 6' (1.829 m)    Wt 93.6 kg     SpO2 99%    BMI 27.99 kg/m  Physical Exam Vitals and nursing note reviewed.  Constitutional:      General: He is not in acute distress.    Appearance: Normal appearance. He is not diaphoretic.  HENT:     Head: Normocephalic.     Mouth/Throat:     Mouth: Mucous membranes are moist.  Cardiovascular:     Rate and Rhythm: Normal rate.  Pulmonary:     Effort: Pulmonary effort is normal. No respiratory distress.     Breath sounds: No decreased breath sounds or rales.  Abdominal:     Palpations: Abdomen is soft.     Tenderness: There is no abdominal tenderness.  Musculoskeletal:     Right lower leg: No edema.     Left lower leg: No edema.  Skin:    General: Skin is warm.  Neurological:     Mental Status: He is alert and oriented to person, place, and time. Mental status is at baseline.  Psychiatric:        Mood and Affect: Mood normal.    ED Results / Procedures / Treatments   Labs (all labs ordered are listed, but only abnormal results are displayed) Labs Reviewed  COMPREHENSIVE METABOLIC PANEL - Abnormal; Notable for the following components:      Result Value   Glucose, Bld 170 (*)    Calcium 8.7 (*)    All other components within normal limits  BRAIN NATRIURETIC PEPTIDE - Abnormal; Notable for the following components:   B Natriuretic Peptide 108.0 (*)    All other components within normal limits  URINALYSIS, ROUTINE W REFLEX MICROSCOPIC - Abnormal; Notable for the following components:   Glucose, UA >=500 (*)    All other components within normal limits  RESP PANEL BY RT-PCR (FLU A&B, COVID) ARPGX2  CBC WITH DIFFERENTIAL/PLATELET  PROTIME-INR  TROPONIN I (HIGH SENSITIVITY)  TROPONIN I (HIGH SENSITIVITY)    EKG EKG Interpretation  Date/Time:  Tuesday March 17 2021 11:03:01 EST Ventricular Rate:  79 PR Interval:  160 QRS Duration: 167 QT Interval:  382 QTC Calculation: 438 R Axis:   69 Text Interpretation: Sinus rhythm Right bundle branch block Baseline  wander in lead(s) II no change Confirmed by Coralee Pesa [phone]) on 03/17/2021 2:39:37 PM  Radiology DG Chest Port 1 View  Result Date: 03/17/2021 CLINICAL DATA:  Cough and chest pain EXAM: PORTABLE CHEST 1 VIEW COMPARISON:  10/11/2019 FINDINGS: Cardiac enlargement. Normal vascularity. Negative for heart failure. Lungs clear without infiltrate or effusion. IMPRESSION: No active disease. Electronically Signed   By: Marlan Palau M.D.   On: 03/17/2021 11:51    Procedures Procedures    Medications Ordered in ED Medications - No data to display  ED Course/ Medical Decision Making/ A&P  Medical Decision Making Amount and/or Complexity of Data Reviewed Labs: ordered.  Risk Prescription drug management.   56 year old male presents emergency department with cough induced chest pain.  States that a productive cough for the past 2 days.  Intermittent blood specks in the cough.  Compliant with his Eliquis.  No swelling of the lower extremity or other acute findings of CHF.  Vitals are stable on arrival.  Patient denies any back/flank pain.  There is no hypoxia or respiratory distress on exam.  Lung sounds are clear.  Blood work is baseline, chest x-ray shows no focal pneumonia, findings of CHF.  Low suspicion for PE at this time given no hypoxia and the fact that he is compliant with Eliquis.  This is most likely a viral upper respiratory infection, will treat him symptomatically.  Flu and COVID swab negative.  Patient at this time appears safe and stable for discharge and close outpatient follow up. Discharge plan and strict return to ED precautions discussed, patient verbalizes understanding and agreement.        Final Clinical Impression(s) / ED Diagnoses Final diagnoses:  Upper respiratory tract infection, unspecified type    Rx / DC Orders ED Discharge Orders          Ordered    guaiFENesin (MUCINEX) 600 MG 12 hr tablet  2 times daily PRN         03/17/21 1440    benzonatate (TESSALON) 100 MG capsule  2 times daily PRN        03/17/21 1440              Yitta Gongaware, Clabe Seal, DO 03/17/21 1506

## 2021-03-17 NOTE — ED Notes (Signed)
Pt taken to the bathroom via wheelchair 

## 2021-03-17 NOTE — ED Triage Notes (Signed)
Pt to the ED with [facility] from [facility] with complaints of a cough and chest pain.  Pt has a hx of CHF and stroke with base line slurred speech.  Cbg 180

## 2021-03-17 NOTE — ED Notes (Signed)
Made pt aware the we need a urine sample and gave pt urinal

## 2021-03-17 NOTE — Discharge Instructions (Signed)
You have been seen and discharged from the emergency department.  You seem to be suffering from a viral upper respiratory infection.  Takes medication for symptom control.  No antibiotics indicated.  Your work-up otherwise was normal from a cardiac standpoint.  Follow-up with your primary provider for further evaluation and further care. Take home medications as prescribed. If you have any worsening symptoms or further concerns for your health please return to an emergency department for further evaluation.

## 2021-04-16 ENCOUNTER — Ambulatory Visit: Payer: Medicaid Other | Admitting: Adult Health

## 2021-05-27 ENCOUNTER — Ambulatory Visit: Payer: Medicaid Other | Admitting: Adult Health

## 2021-07-07 ENCOUNTER — Ambulatory Visit (HOSPITAL_COMMUNITY)
Admission: RE | Admit: 2021-07-07 | Discharge: 2021-07-07 | Disposition: A | Discharge status: Home / Self Care | Payer: Medicaid Other | Source: Ambulatory Visit | Attending: Internal Medicine | Admitting: Internal Medicine

## 2021-07-07 ENCOUNTER — Encounter (HOSPITAL_COMMUNITY): Payer: Self-pay | Admitting: Internal Medicine

## 2021-07-07 ENCOUNTER — Ambulatory Visit (HOSPITAL_BASED_OUTPATIENT_CLINIC_OR_DEPARTMENT_OTHER)
Admission: RE | Admit: 2021-07-07 | Discharge: 2021-07-07 | Disposition: A | Discharge status: Home / Self Care | Payer: Medicaid Other | Source: Ambulatory Visit

## 2021-07-07 VITALS — BP 156/110 | HR 61 | Wt 218.8 lb

## 2021-07-07 DIAGNOSIS — R42 Dizziness and giddiness: Secondary | ICD-10-CM | POA: Diagnosis not present

## 2021-07-07 DIAGNOSIS — Z79899 Other long term (current) drug therapy: Secondary | ICD-10-CM | POA: Diagnosis not present

## 2021-07-07 DIAGNOSIS — Z8673 Personal history of transient ischemic attack (TIA), and cerebral infarction without residual deficits: Secondary | ICD-10-CM | POA: Insufficient documentation

## 2021-07-07 DIAGNOSIS — Z7901 Long term (current) use of anticoagulants: Secondary | ICD-10-CM | POA: Insufficient documentation

## 2021-07-07 DIAGNOSIS — I10 Essential (primary) hypertension: Secondary | ICD-10-CM

## 2021-07-07 DIAGNOSIS — I5022 Chronic systolic (congestive) heart failure: Secondary | ICD-10-CM

## 2021-07-07 DIAGNOSIS — Z91148 Patient's other noncompliance with medication regimen for other reason: Secondary | ICD-10-CM | POA: Insufficient documentation

## 2021-07-07 DIAGNOSIS — Z8249 Family history of ischemic heart disease and other diseases of the circulatory system: Secondary | ICD-10-CM | POA: Insufficient documentation

## 2021-07-07 DIAGNOSIS — Z7984 Long term (current) use of oral hypoglycemic drugs: Secondary | ICD-10-CM | POA: Diagnosis not present

## 2021-07-07 DIAGNOSIS — I428 Other cardiomyopathies: Secondary | ICD-10-CM | POA: Insufficient documentation

## 2021-07-07 DIAGNOSIS — I6340 Cerebral infarction due to embolism of unspecified cerebral artery: Secondary | ICD-10-CM | POA: Diagnosis not present

## 2021-07-07 DIAGNOSIS — E119 Type 2 diabetes mellitus without complications: Secondary | ICD-10-CM | POA: Insufficient documentation

## 2021-07-07 DIAGNOSIS — F209 Schizophrenia, unspecified: Secondary | ICD-10-CM | POA: Diagnosis not present

## 2021-07-07 DIAGNOSIS — I110 Hypertensive heart disease with heart failure: Secondary | ICD-10-CM | POA: Diagnosis not present

## 2021-07-07 LAB — CBC
HCT: 46.3 % (ref 39.0–52.0)
Hemoglobin: 14.5 g/dL (ref 13.0–17.0)
MCH: 25.5 pg — ABNORMAL LOW (ref 26.0–34.0)
MCHC: 31.3 g/dL (ref 30.0–36.0)
MCV: 81.5 fL (ref 80.0–100.0)
Platelets: 236 10*3/uL (ref 150–400)
RBC: 5.68 MIL/uL (ref 4.22–5.81)
RDW: 13.7 % (ref 11.5–15.5)
WBC: 4.5 10*3/uL (ref 4.0–10.5)
nRBC: 0 % (ref 0.0–0.2)

## 2021-07-07 LAB — COMPREHENSIVE METABOLIC PANEL
ALT: 17 U/L (ref 0–44)
AST: 14 U/L — ABNORMAL LOW (ref 15–41)
Albumin: 4.3 g/dL (ref 3.5–5.0)
Alkaline Phosphatase: 78 U/L (ref 38–126)
Anion gap: 7 (ref 5–15)
BUN: 13 mg/dL (ref 6–20)
CO2: 23 mmol/L (ref 22–32)
Calcium: 9.4 mg/dL (ref 8.9–10.3)
Chloride: 106 mmol/L (ref 98–111)
Creatinine, Ser: 1.42 mg/dL — ABNORMAL HIGH (ref 0.61–1.24)
GFR, Estimated: 59 mL/min — ABNORMAL LOW (ref >60)
Glucose, Bld: 192 mg/dL — ABNORMAL HIGH (ref 70–99)
Potassium: 4.4 mmol/L (ref 3.5–5.1)
Sodium: 136 mmol/L (ref 135–145)
Total Bilirubin: 0.8 mg/dL (ref 0.3–1.2)
Total Protein: 7.8 g/dL (ref 6.5–8.1)

## 2021-07-07 LAB — BRAIN NATRIURETIC PEPTIDE: B Natriuretic Peptide: 37.3 pg/mL (ref 0.0–100.0)

## 2021-07-07 LAB — ECHOCARDIOGRAM COMPLETE
Area-P 1/2: 1.43 cm2
MV M vel: 4.68 m/s
MV Peak grad: 87.6 mmHg
Radius: 0.4 cm
S' Lateral: 5.5 cm

## 2021-07-07 MED ORDER — ENTRESTO 49-51 MG PO TABS: 1.0000 | ORAL_TABLET | Freq: Two times a day (BID) | ORAL | 3 refills | Status: DC

## 2021-07-07 NOTE — Patient Instructions (Signed)
Increase Entresto to 49/51 Twice daily   Labs done today, your results will be available in MyChart, we will contact you for abnormal readings.  Your physician recommends that you schedule a follow-up appointment in: 3 months.  If you have any questions or concerns before your next appointment please send [place] a message through [place] or call our office at [phone].    TO LEAVE A MESSAGE FOR THE NURSE SELECT OPTION 2, PLEASE LEAVE A MESSAGE INCLUDING: YOUR NAME DATE OF BIRTH CALL BACK NUMBER REASON FOR CALL**this is important as we prioritize the call backs  YOU WILL RECEIVE A CALL BACK THE SAME DAY AS LONG AS YOU CALL BEFORE 4:00 PM  At the [facility], you and your health needs are our priority. As part of our continuing mission to provide you with exceptional heart care, we have created designated Provider Care Teams. These Care Teams include your primary Cardiologist (physician) and Advanced Practice Providers (APPs- Physician Assistants and Nurse Practitioners) who all work together to provide you with the care you need, when you need it.   You may see any of the following providers on your designated Care Team at your next follow up: Dr Arvilla Meres Dr Carron Curie, NP Robbie Lis, [name] [facility] [place], [name] Karle Plumber, PharmD   Please be sure to bring in all your medications bottles to every appointment.

## 2021-07-07 NOTE — Progress Notes (Signed)
[facility] PROGRESS NOTE Primary Care: [place] Primary Cardiologist: [place] AHF: Dr. Haroldine Laws    HPI: Kerry Hamilton is a 56 y.o. male with a past medial history significant for schizophrenia, tobacco/ETOH abuse, diabetes and systolic HF due to NICM with EF 15%.  Hospitalized in 5/20 with acute HF. Echo LVEF 15%  R/L cath - No CAD mildly elevated LV filling pressures. CI 2.2    Seen in ED 3/21 with recurrent HF in setting of poor compliance w/ home lasix and cocaine and ETOH use. He was treated w/ dose of IV Lasix in the ED w/ improvement in symptoms and did not require admission.  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 No Afib detected.  Echo w/ EF 15-20%. No thrombus identified. Carotid duplex negative. His cardiac meds were continued spiro, losartan, dig and lasix. Neurology started him on Eliquis. He was discharged to SNF at [facility]. Again, he was lost to f/u in the [facility].    Seen 3/22 by neurology for routine f/u and referred back to our office for ongoing cardiac care. Of note, neurology has recommended continuation of Eliquis until EF is > 30%.   Last seen for f/u 10/22. Volume looked okay/mildly elevated. PO lasix continued. Reported dizziness. Felt to be likely neurogenic d/t prior CVA.  Now residing in SNF [facility]  Here for f/u. Says he is doing ok. No CP or SOB. Stil gets dizzy/off balance when he stands. BP running high. Eating a lot of food. No edema, orthopnea or PND   Echo today 07/07/21: EF 15-20% moderate RV dysfunction. Moderate MR Personally reviewed    Cardiac Studies: - Echo 5/20 EF 15%. - LHC (5/20): no CAD.  - Echo (9/21): EF 15-20%, RV severely reduced (this was in setting of poor med compliance and cocaine use.   Past Medical History:  Diagnosis Date    Cocaine abuse ([place])    CVA (cerebral vascular accident) ([place]) 09/2019   Depression    Diabetes mellitus without complication (HCC)    Systolic congestive heart failure (HCC)    Tobacco abuse    Current Outpatient Medications  Medication Sig Dispense Refill   apixaban (ELIQUIS) 5 MG TABS tablet Take 1 tablet (5 mg total) by mouth 2 (two) times daily. 60 tablet 0   atorvastatin (LIPITOR) 10 MG tablet Take 10 mg by mouth daily.     busPIRone (BUSPAR) 10 MG tablet Take 15 mg by mouth 2 (two) times daily.     carvedilol (COREG) 3.125 MG tablet Take 3.125 mg by mouth 2 (two) times daily with a meal.     [employer] SENNA PLUS 8.6-50 MG tablet Take 1 tablet by mouth 2 (two) times daily.     FLUoxetine (PROZAC) 20 MG capsule Take 1 capsule (20 mg total) by mouth daily. 30 capsule 0   furosemide (LASIX) 20 MG tablet Take 0.5 tablets (10 mg total) by mouth daily. 45 tablet 3   Insulin Aspart (NOVOLOG FLEXPEN ) Inject 0-10 Units into the skin See admin instructions. Per sliding scale. 71-150= 0u, 151-200= 2u, 201-250= 4u, 251-300= 6u, 301-350= 8u, 351-400= 10u     Insulin Glargine (BASAGLAR KWIKPEN) 100 UNIT/ML Inject 14 Units into the skin at bedtime.     JARDIANCE 25 MG TABS tablet Take 25 mg by mouth daily.  meclizine (ANTIVERT) 25 MG tablet Take 25 mg by mouth 3 (three) times daily.     metFORMIN (GLUCOPHAGE) 500 MG tablet Take 500 mg by mouth 2 (two) times daily with a meal.     pantoprazole (PROTONIX) 40 MG tablet TAKE 1 TABLET(40 MG) BY MOUTH DAILY (Patient taking differently: Take 40 mg by mouth daily.) 90 tablet 0   risperiDONE (RISPERDAL) 1 MG tablet Take 1 mg by mouth at bedtime.     sacubitril-valsartan (ENTRESTO) 24-26 MG Take 1 tablet by mouth 2 (two) times daily. 60 tablet 3   spironolactone (ALDACTONE) 25 MG tablet Take 1 tablet (25 mg total) by mouth daily. 90 tablet 3   thiamine 100 MG tablet Take 1 tablet (100 mg total) by mouth daily. 30 tablet 0   No current facility-administered  medications for this encounter.   No Known Allergies  Social History   Socioeconomic History   Marital status: Single    Spouse name: Not on file   Number of children: Not on file   Years of education: Not on file   Highest education level: Not on file  Occupational History   Not on file  Tobacco Use   Smoking status: Former    Packs/day: 1.00    Types: Cigarettes, Cigars   Smokeless tobacco: Never  Vaping Use   Vaping Use: Never used  Substance and Sexual Activity   Alcohol use: Yes    Alcohol/week: 6.0 standard drinks of alcohol    Types: 6 Cans of beer per week    Comment: daily   Drug use: Yes    Types: "Crack" cocaine   Sexual activity: Not Currently  Other Topics Concern   Not on file  Social History Narrative   Not on file   Social Determinants of Health   Financial Resource Strain: Not on file  Food Insecurity: Not on file  Transportation Needs: Not on file  Physical Activity: Not on file  Stress: Not on file  Social Connections: Not on file  Intimate Partner Violence: Not on file   Family History  Problem Relation Age of Onset   CAD Paternal Grandmother    Diabetes Mellitus II Neg Hx    BP (!) 156/110   Pulse 61   Wt 99.2 kg (218 lb 12.8 oz)   SpO2 98%   BMI 29.67 kg/m     Wt Readings from Last 3 Encounters:  07/07/21 99.2 kg (218 lb 12.8 oz)  03/17/21 93.6 kg (206 lb 6.4 oz)  03/05/21 93.6 kg (206 lb 6.4 oz)   PHYSICAL EXAM: General:  Well appearing. No resp difficulty. Sitting in wheelchair. Caregiver present. General:  Well appearing. No resp difficulty HEENT: normal Neck: supple. no JVD. Carotids 2+ bilat; no bruits. No lymphadenopathy or thryomegaly appreciated. Cor: PMI nondisplaced. Regular rate & rhythm. 2/6 MR Lungs: clear Abdomen: soft, nontender, nondistended. No hepatosplenomegaly. No bruits or masses. Good bowel sounds. Extremities: no cyanosis, clubbing, rash, edema Neuro: alert & orientedx3, cranial nerves grossly intact.  moves all 4 extremities w/o difficulty. Affect pleasant   ASSESSMENT & PLAN:  1. Chronic systolic HF due to NICM - Echo 5/20 EF 15% - LHC (5/20): no CAD.  - Echo (9/21): EF 15-20%, RV severely reduced (this was in setting of poor med compliance and cocaine use.  - Echo 11/06/20 EF 20-25% - Echo today 07/07/21: EF 15-20% moderate RV dysfunction. Moderate MR Personally reviewed - Functional status difficult as he primarily uses WC. But overall stable - Volume  status okay despite weight gain. Continue 10 mg furosemide daily - Continue Jardiance 10 mg daily - Increase Entresto to 49/51 mg bid. (Doubt dizziness is due to BP.) - Continue carvedilol 3.125 bid    - Continue Spiro 25 mg daily  - Labs today - Not currently a good candidate for ICD  2. 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  - Neuro. Much imprioved - Per neuro continue Eliquis for secondary prevention of cardiac thrombus formation, as long as EF remains < 30%. - Continue Eliquis. No bleeding. Check CBC  3. DM2 - Continue Jardiance  4. Former Polysubstance abuse  - No further use now residing at [facility].  5. HTN - BP high today  6. Dizziness - Orthostatics negative at multiple visits - suspect neurogenic due to previous CVA.    Glori Bickers, MD  07/07/21

## 2021-07-13 NOTE — Progress Notes (Unsigned)
[facility] [address] [place]. [place] [address] [phone]       STROKE FOLLOW UP NOTE  [name] Date of Birth:  May 06, 1965 Medical Record Number:  [id number]   Reason for Referral:  stroke follow up    SUBJECTIVE:   CHIEF COMPLAINT:  No chief complaint on file.   HPI:   Update 07/13/2021 JM: Patient returns for stroke follow-up after prior visit 9 months ago.  Continues to reside at [facility].  Stable from stroke standpoint without new stroke/TIA symptoms.  Continued dizziness, unchanged since prior visit.  Per SNF MAR review, continues on Eliquis and ***, denies side effects.  Blood pressure today ***. Recent 2D echo by cardiology showed EF 15 to 20% -medication adjustments made with plans on follow-up visit in September.  Remains abstinent from polysubstance abuse.  No new concerns at this time.       History provided for reference purposes only Update 10/16/2020 JM: Kerry Hamilton returns for 69-month stroke follow-up unaccompanied.  He continues to reside at [facility]. Continues to experience dizziness when ambulating or with position changes. Per patient, seen by cards who felt dizziness likely due to prior stroke. Occurs off/on and doesn't last long. No longer working with therapies.  Ambulation remains limited due to dizziness.  Continued aphasia which has been stable. He does good when he speaks slower and concentrates on pronunciation.  Denies new stroke/TIA symptoms. He reports being on eliquis without side effects. He is unsure if atrovastatin - no MAR list from facility. Blood pressure today 122/82. Routinely monitors at SNF and has been stable per patient.  No further concerns at this time  Update 04/10/2020 JM: Kerry Hamilton returns for 28-month stroke follow-up accompanied by his sister  Continues to reside at [facility]  Reports residual dizziness and speech difficulty Continues to work with speech therapy reporting  continued improvement Dizziness typically occurs upon standing from a seated position and quickly subsides - due to dizziness, he limits ambulation and mainly transfers via wheelchair Denies new stroke/TIA symptoms  Compliant on Eliquis and atorvastatin -denies side effects Blood pressure today 150/86  He has not had follow-up with cardiology since hospital discharge  No new concerns at this time  Initial visit 12/06/2019 JM: Kerry Hamilton is being seen for hospital follow-up accompanied by his sister. He continues to reside at [facility] currently receiving PT/OT/SLP. He reports improvement since discharge. He continues to complain of dizziness, imbalance, headache, and speech difficulty. Dizziness and headaches occur with standing and ambulation which was reported during inpatient therapy sessions. He tranfers via w/c due to dizziness but ambulates with RW with therapy. Denies new or worseing stroke/TIA symptoms. Remains on eliquis without bleeding or bruising. Remains on atrovastatin without myalgias. Blood pressure today 143/81.  Obviously abstinence from EtOH, tobacco and cocaine.  No further concerns at this time.  Stroke admission 10/12/2019 Kerry Hamilton is a 56 y.o. male with history of  depression, DM2, CHF with EF of 15%, EtOH use, cocaine use who is admitted 10/12/2019 with SOB and chest pain. On Ativan PRN per CIWA protocol. On 10/16/2019 noted to have difficulty with language (garbled speech and not following commands).  Personally reviewed pertinent hospitalization progress notes, lab work and imaging with summary provided.  Evaluated by Dr. Roda Shutters with stroke work-up revealing right MCA infarct embolic most likely secondary to cardiomyopathy with low EF 15 to 20% in setting of EtOH and cocaine use.  Recommend initiate  anticoagulation 7 to 10 days post stroke given severe cardiomyopathy with stroke and Eliquis initiated on 9/28.  Advise AC can be discontinued once EF > 30 to 35%.  Once  off AC, recommend 30-day cardiac event monitor or loop recorder to rule out A. fib.  Discussed EtOH, tobacco and cocaine cessation with counseling provided.  LDL 102 and initiate atorvastatin 40 mg daily.  Elevated ALT and advised monitor LFTs outpatient.  Controlled DM with A1c 6.3.  Evaluated by therapy and recommended discharge to CIR but eventually discharged to SNF for extensive rehab for residual aphasia, cognitive impairment, dizziness and gait impairment.  Stroke:   R MCA infarct embolic most likely secondary to cardiomyopathy w/ low EF in setting of alcohol and cocaine use CT head acute/subacute R poster MCA infarct w/ mild local mass effect. MRI  Moderate R frontoparietal, posterior R MCA infarct MRA  Unremarkable  Carotid Doppler  B ICA 1-39% stenosis, VAs antegrade  2D Echo EF 15-20%. No source of embolus. LA severely dilated LDL 102 HgbA1c 6.3 UDS neg VTE prophylaxis - Lovenox 40 mg sq daily  No antithrombotic prior to admission, now on aspirin 81 mg daily. From neuro standpoint, recommend anticoagulation in 7-10 days post stroke (9/27-9/30) given severe cardiomyopathy with stroke.  Therapy recommendations:  CIR -->SNF Disposition:  SNF      ROS:   14 system review of systems performed and negative with exception of those listed in HPI  PMH:  Past Medical History:  Diagnosis Date   Cocaine abuse (HCC)    CVA (cerebral vascular accident) (HCC) 09/2019   Depression    Diabetes mellitus without complication (HCC)    Systolic congestive heart failure (HCC)    Tobacco abuse     PSH:  Past Surgical History:  Procedure Laterality Date   RIGHT/LEFT HEART CATH AND CORONARY ANGIOGRAPHY N/A 06/13/2018   Procedure: RIGHT/LEFT HEART CATH AND CORONARY ANGIOGRAPHY;  Surgeon: [place], Peter M, MD;  Location: [facility] INVASIVE CV LAB;  Service: Cardiovascular;  Laterality: N/A;    Social History:  Social History   Socioeconomic History   Marital status: Single    Spouse name: Not on  file   Number of children: Not on file   Years of education: Not on file   Highest education level: Not on file  Occupational History   Not on file  Tobacco Use   Smoking status: Former    Packs/day: 1.00    Types: Cigarettes, Cigars   Smokeless tobacco: Never  Vaping Use   Vaping Use: Never used  Substance and Sexual Activity   Alcohol use: Yes    Alcohol/week: 6.0 standard drinks of alcohol    Types: 6 Cans of beer per week    Comment: daily   Drug use: Yes    Types: "Crack" cocaine   Sexual activity: Not Currently  Other Topics Concern   Not on file  Social History Narrative   Not on file   Social Determinants of Health   Financial Resource Strain: Not on file  Food Insecurity: Not on file  Transportation Needs: Not on file  Physical Activity: Not on file  Stress: Not on file  Social Connections: Not on file  Intimate Partner Violence: Not on file    Family History:  Family History  Problem Relation Age of Onset   CAD Paternal Grandmother    Diabetes Mellitus II Neg Hx     Medications:   Current Outpatient Medications on File Prior to Visit  Medication  Sig Dispense Refill   apixaban (ELIQUIS) 5 MG TABS tablet Take 1 tablet (5 mg total) by mouth 2 (two) times daily. 60 tablet 0   atorvastatin (LIPITOR) 10 MG tablet Take 10 mg by mouth daily.     busPIRone (BUSPAR) 10 MG tablet Take 15 mg by mouth 2 (two) times daily.     carvedilol (COREG) 3.125 MG tablet Take 3.125 mg by mouth 2 (two) times daily with a meal.     [employer]S SENNA PLUS 8.6-50 MG tablet Take 1 tablet by mouth 2 (two) times daily.     FLUoxetine (PROZAC) 20 MG capsule Take 1 capsule (20 mg total) by mouth daily. 30 capsule 0   furosemide (LASIX) 20 MG tablet Take 0.5 tablets (10 mg total) by mouth daily. 45 tablet 3   Insulin Aspart (NOVOLOG FLEXPEN [place]) Inject 0-10 Units into the skin See admin instructions. Per sliding scale. 71-150= 0u, 151-200= 2u, 201-250= 4u, 251-300= 6u, 301-350= 8u, 351-400= 10u      Insulin Glargine (BASAGLAR KWIKPEN) 100 UNIT/ML Inject 14 Units into the skin at bedtime.     JARDIANCE 25 MG TABS tablet Take 25 mg by mouth daily.     meclizine (ANTIVERT) 25 MG tablet Take 25 mg by mouth 3 (three) times daily.     metFORMIN (GLUCOPHAGE) 500 MG tablet Take 500 mg by mouth 2 (two) times daily with a meal.     pantoprazole (PROTONIX) 40 MG tablet TAKE 1 TABLET(40 MG) BY MOUTH DAILY (Patient taking differently: Take 40 mg by mouth daily.) 90 tablet 0   risperiDONE (RISPERDAL) 1 MG tablet Take 1 mg by mouth at bedtime.     sacubitril-valsartan (ENTRESTO) 49-51 MG Take 1 tablet by mouth 2 (two) times daily. 180 tablet 3   spironolactone (ALDACTONE) 25 MG tablet Take 1 tablet (25 mg total) by mouth daily. 90 tablet 3   thiamine 100 MG tablet Take 1 tablet (100 mg total) by mouth daily. 30 tablet 0   No current facility-administered medications on file prior to visit.    Allergies:  No Known Allergies    OBJECTIVE:  Physical Exam  There were no vitals filed for this visit.  There is no height or weight on file to calculate BMI. No results found.  General: well developed, well nourished,  very [name], seated, in no evident distress Head: head normocephalic and atraumatic.   Neck: supple with no carotid or supraclavicular bruits Cardiovascular: regular rate and rhythm, no murmurs Musculoskeletal: no deformity Skin:  no rash/petichiae Vascular:  Normal pulses all extremities   Neurologic Exam Mental Status: Awake and fully alert.   Mild to moderate expressive > mild receptive aphasia.  Mild dysarthria.  Able to follow simple step commands.  Oriented to place and time. Recent and remote memory appears intact. Attention span, concentration and fund of knowledge appropriate during visit. Mood and affect appropriate and cooperative with exam.  Cranial Nerves: Pupils equal, briskly reactive to light. Extraocular movements full without  nystagmus.  Not consistent with blinking to visual threat bilaterally. Hearing intact. Facial sensation intact. Face, tongue, palate moves normally and symmetrically.  Motor: Normal bulk and tone. Normal strength in all tested extremity muscles. Sensory.:  Intact to light touch, vibratory and pinprick sensation throughout Coordination: Rapid alternating movements normal in all extremities. Finger-to-nose and heel-to-shin performed accurately bilaterally. Gait and Station: Deferred  Reflexes: 1+ and symmetric. Toes downgoing.         ASSESSMENT: Kerry Hamilton is a  56 y.o. year old male presented initially on 10/12/2019 with SOB an chest pain developing difficulty with language and not following commands on 10/16/2019 with stroke work-up revealing right MCA infarct embolic most likely secondary to cardiomyopathy with low EF 15 to 20% in setting of EtOH and cocaine use. Vascular risk factors include substance abuse, tobacco use, medication noncompliance, DM, HTN, HLD, and CHF with a EF 15 to 20%.      PLAN:  R MCA stroke :  Residual deficit: Expressive> receptive aphasia, dysarthria, and dizziness  dizziness limiting ambulation and activity therefore recommend trialing meclizine 12.5 mg 3 times daily.  Request SNF monitor and increase dose if needed Continue Eliquis (apixaban) daily  and atorvastatin for secondary stroke prevention and for cardiomyopathy Discussed secondary stroke prevention measures and importance of close PCP follow up for aggressive stroke risk factor management including BP goal<130/90, HLD with LDL goal<70 and DM with A1c.<7   CHF 2/2 cardiomyopathy: Due to alcohol and cocaine use 2D echo 06/2021 EF 15 to 20% 2D echo 10/2020 EF 20 to 25% Per Dr. Pearlean Brownie, recommend continuation of Eliquis until EF > 30%.  If Eliquis discontinued in the future, Dr. Pearlean Brownie recommends cardiac monitor or loop recorder to assess for atrial fibrillation  Substance abuse: Reports complete abstinence  currently residing in SNF.  Discussed importance of continued abstinence in setting of CHF with cardiomyopathy and recent stroke     Follow up in 6 months or call earlier if needed   CC:  [name], FNP    I spent 32 minutes of face-to-face and non-face-to-face time with patient.  This included previsit chart review, lab review, study review, order entry, electronic health record documentation, patient education regarding prior stroke and etiology, residual deficits, importance of managing stroke risk factors and continued follow-up with cardiology and answered all other questions to patient satisfaction  Ihor Austin, [facility]  [facility] [address] [place], [place] [address]  Phone [phone] Fax [phone] Note: This document was prepared with digital dictation and possible smart phrase technology. Any transcriptional errors that result from this process are unintentional.

## 2021-07-14 ENCOUNTER — Encounter: Payer: Self-pay | Admitting: Adult Health

## 2021-07-14 ENCOUNTER — Ambulatory Visit: Payer: Medicaid Other | Admitting: Adult Health

## 2021-07-14 VITALS — BP 142/85 | HR 61 | Ht 72.0 in

## 2021-07-14 DIAGNOSIS — I63411 Cerebral infarction due to embolism of right middle cerebral artery: Secondary | ICD-10-CM | POA: Diagnosis not present

## 2021-07-14 NOTE — Patient Instructions (Addendum)
Overall stable from stroke standpoint and recommend following up on an as-needed basis at this time.  Please call with any questions or concerns in the future   Recommendations:  Consider changing meclizine to as needed or decreasing dose - he feels like this worsens his dizziness but unsure how accurate this is - please ensure this is being evaluated routinely   Consider PT evaluation to see if he would benefit from use of a walker as he feels unsteady with standing and short distance ambulation  Would consider evaluation with ophthalmologist for eye movement abnormalities   Continue Eliquis (apixaban) daily  and atorvastatin  for secondary stroke prevention and for cardiomyopathy   Continue to follow up with PCP regarding cholesterol, diabetes and blood pressure management  Maintain strict control of hypertension with blood pressure goal below 130/90, diabetes with hemoglobin A1c goal below 7.0 % and cholesterol with LDL cholesterol (bad cholesterol) goal below 70 mg/dL.   Signs of a Stroke? Follow the BEFAST method:  Balance Watch for a sudden loss of balance, trouble with coordination or vertigo Eyes Is there a sudden loss of vision in one or both eyes? Or double vision?  Face: Ask the person to smile. Does one side of the face droop or is it numb?  Arms: Ask the person to raise both arms. Does one arm drift downward? Is there weakness or numbness of a leg? Speech: Ask the person to repeat a simple phrase. Does the speech sound slurred/strange? Is the person confused ? Time: If you observe any of these signs, call 911.        Thank you for coming to see [place] at [facility]. I hope we have been able to provide you high quality care today.  You may receive a patient satisfaction survey over the next few weeks. We would appreciate your feedback and comments so that we may continue to improve ourselves and the health of our patients.     Stroke Prevention Some  medical conditions and lifestyle choices can lead to a higher risk for a stroke. You can help to prevent a stroke by eating healthy foods and exercising. It also helps to not smoke and to manage any health problems you may have. How can this condition affect me? A stroke is an emergency. It should be treated right away. A stroke can lead to brain damage or threaten your life. There is a better chance of surviving and getting better after a stroke if you get medical help right away. What can increase my risk? The following medical conditions may increase your risk of a stroke: Diseases of the heart and blood vessels (cardiovascular disease). High blood pressure (hypertension). Diabetes. High cholesterol. Sickle cell disease. Problems with blood clotting. Being very overweight. Sleeping problems (obstructivesleep apnea). Other risk factors include: Being older than age 6. A history of blood clots, stroke, or mini-stroke (TIA). Race, ethnic background, or a family history of stroke. Smoking or using tobacco products. Taking birth control pills, especially if you smoke. Heavy alcohol and drug use. Not being active. What actions can I take to prevent this? Manage your health conditions High cholesterol. Eat a healthy diet. If this is not enough to manage your cholesterol, you may need to take medicines. Take medicines as told by your doctor. High blood pressure. Try to keep your blood pressure below 130/80. If your blood pressure cannot be managed through a healthy diet and regular exercise, you may need to take medicines. Take medicines  as told by your doctor. Ask your doctor if you should check your blood pressure at home. Have your blood pressure checked every year. Diabetes. Eat a healthy diet and get regular exercise. If your blood sugar (glucose) cannot be managed through diet and exercise, you may need to take medicines. Take medicines as told by your doctor. Talk to your doctor  about getting checked for sleeping problems. Signs of a problem can include: Snoring a lot. Feeling very tired. Make sure that you manage any other conditions you have. Nutrition  Follow instructions from your doctor about what to eat or drink. You may be told to: Eat and drink fewer calories each day. Limit how much salt (sodium) you use to 1,500 milligrams (mg) each day. Use only healthy fats for cooking, such as olive oil, canola oil, and sunflower oil. Eat healthy foods. To do this: Choose foods that are high in fiber. These include whole grains, and fresh fruits and vegetables. Eat at least 5 servings of fruits and vegetables a day. Try to fill one-half of your plate with fruits and vegetables at each meal. Choose low-fat (lean) proteins. These include low-fat cuts of meat, chicken without skin, fish, tofu, beans, and nuts. Eat low-fat dairy products. Avoid foods that: Are high in salt. Have saturated fat. Have trans fat. Have cholesterol. Are processed or pre-made. Count how many carbohydrates you eat and drink each day. Lifestyle If you drink alcohol: Limit how much you have to: 0-1 drink a day for women who are not pregnant. 0-2 drinks a day for men. Know how much alcohol is in your drink. In the [place] one drink equals one 12 oz bottle of beer ( ), one 5 oz glass of wine ( ), or one 1 oz glass of hard liquor (60mL). Do not smoke or use any products that have nicotine or tobacco. If you need help quitting, ask your doctor. Avoid secondhand smoke. Do not use drugs. Activity  Try to stay at a healthy weight. Get at least 30 minutes of exercise on most days, such as: Fast walking. Biking. Swimming. Medicines Take over-the-counter and prescription medicines only as told by your doctor. Avoid taking birth control pills. Talk to your doctor about the risks of taking birth control pills if: You are over 31 years old. You smoke. You get very bad headaches. You have  had a blood clot. Where to find more information American Stroke Association: www.strokeassociation.org Get help right away if: You or a loved one has any signs of a stroke. "BE FAST" is an easy way to remember the warning signs: B - Balance. Dizziness, sudden trouble walking, or loss of balance. E - Eyes. Trouble seeing or a change in how you see. F - Face. Sudden weakness or loss of feeling of the face. The face or eyelid may droop on one side. A - Arms. Weakness or loss of feeling in an arm. This happens all of a sudden and most often on one side of the body. S - Speech. Sudden trouble speaking, slurred speech, or trouble understanding what people say. T - Time. Time to call emergency services. Write down what time symptoms started. You or a loved one has other signs of a stroke, such as: A sudden, very bad headache with no known cause. Feeling like you may vomit (nausea). Vomiting. A seizure. These symptoms may be an emergency. Get help right away. Call your local emergency services (911 in the [place] Do not wait to see if the symptoms  will go away. Do not drive yourself to the hospital. Summary You can help to prevent a stroke by eating healthy, exercising, and not smoking. It also helps to manage any health problems you have. Do not smoke or use any products that contain nicotine or tobacco. Get help right away if you or a loved one has any signs of a stroke. This information is not intended to replace advice given to you by your health care provider. Make sure you discuss any questions you have with your health care provider. Document Revised: 08/13/2019 Document Reviewed: 08/13/2019 Elsevier Patient Education  2023 [employer].

## 2021-08-06 IMAGING — CT CT HEAD W/O CM
4 series · 17 of 47 positions shown, 19 images · non-contrast
Comparison: None.

CLINICAL DATA: 53-year-old male with head trauma.

EXAM:
CT HEAD WITHOUT CONTRAST
TECHNIQUE: Contiguous axial images were obtained from the base of the skull
through the vertex without intravenous contrast.

[Series 3: head without · axial · non-contrast · 0.44mm/px · z∈[-51,+74]mm · 7 of 35 slices shown, 9 images]
[im 5/35  brain]
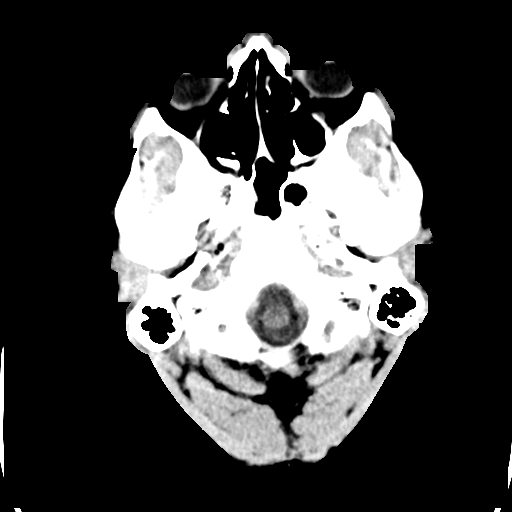
[im 5/35  bone]
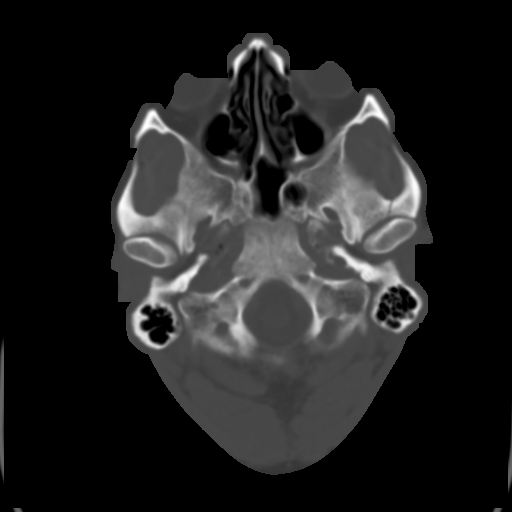
[im 9/35  brain]
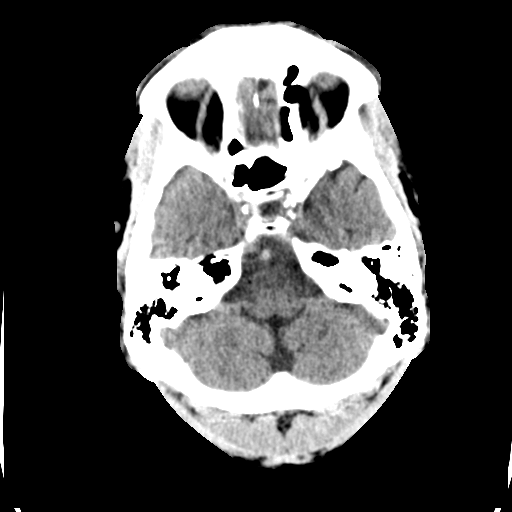
[im 13/35  brain]
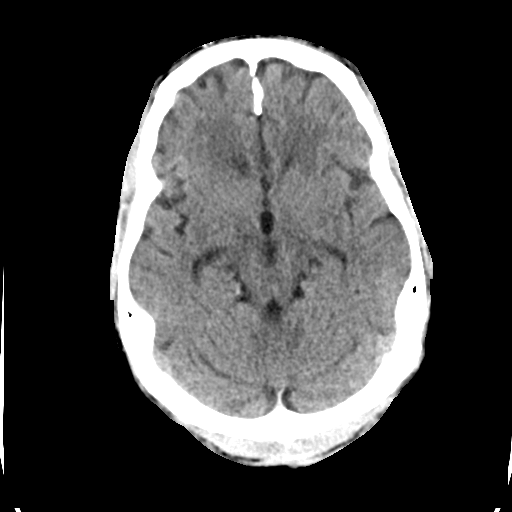
[im 18/35  brain]
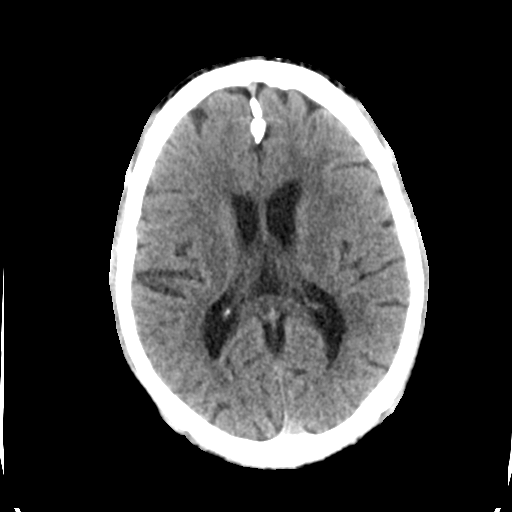
[im 22/35  brain]
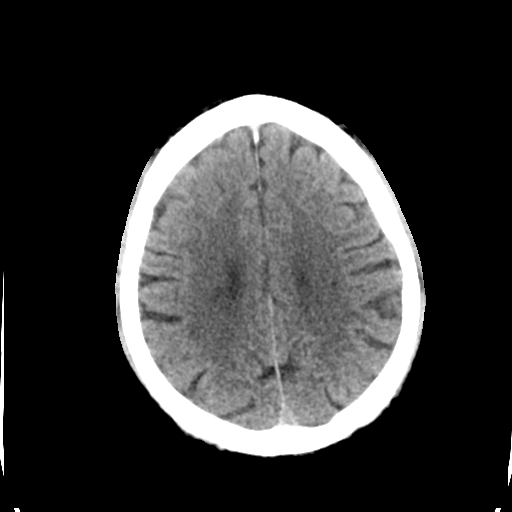
[im 22/35  bone]
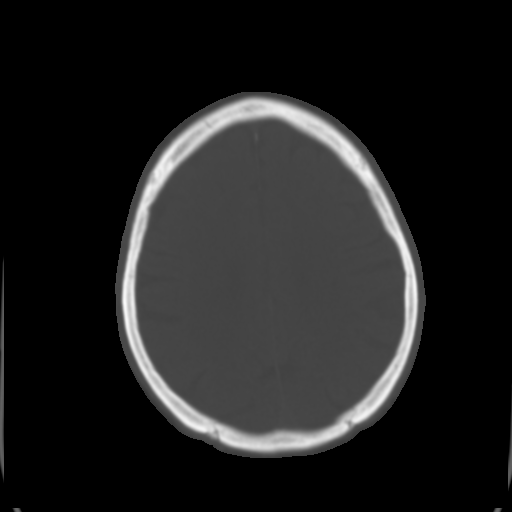
[im 26/35  brain]
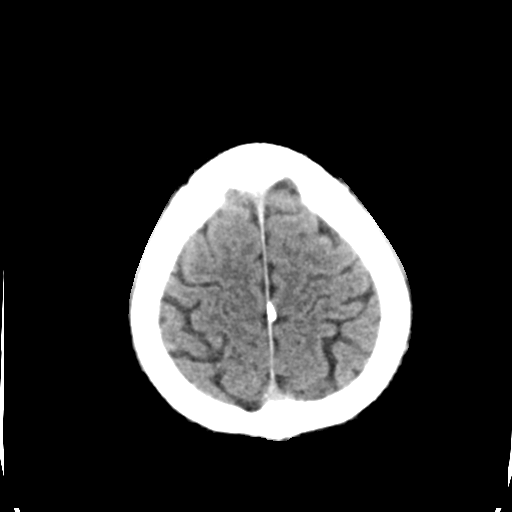
[im 30/35  brain]
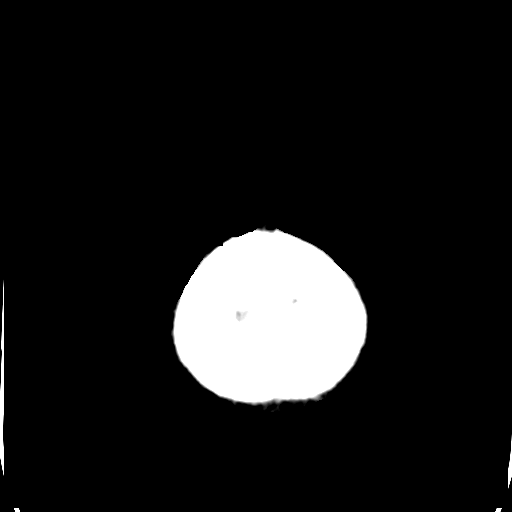

[Series 4: head bone · axial · 0.44mm/px · z∈[-55,+5]mm · 4 of 87 slices shown]
[im 9/87  bone]
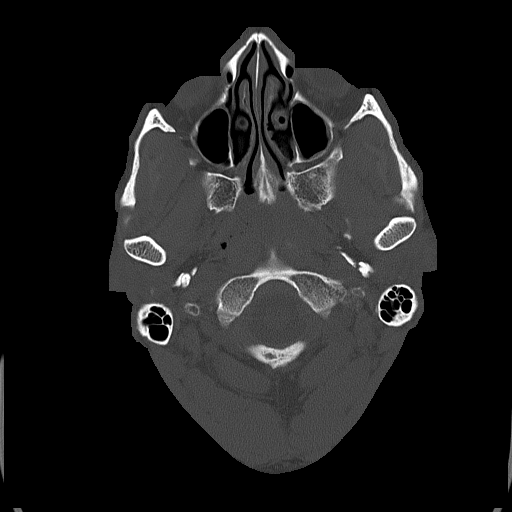
[im 18/87  bone]
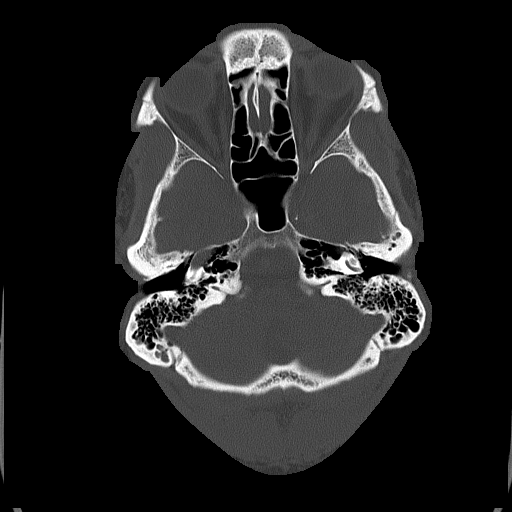
[im 26/87  bone]
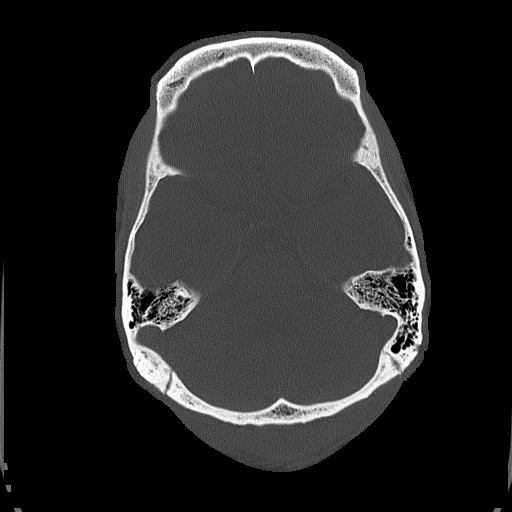
[im 39/87  bone]
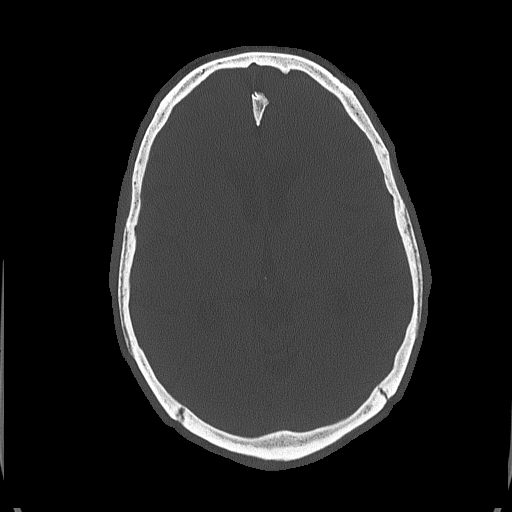

[Series 5: head without cor · coronal · non-contrast · 0.34mm/px · 3 of 73 slices shown]
[im 25/73  brain]
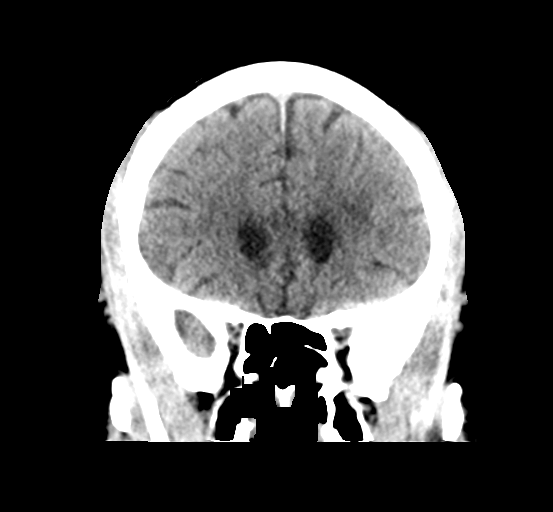
[im 33/73  brain]
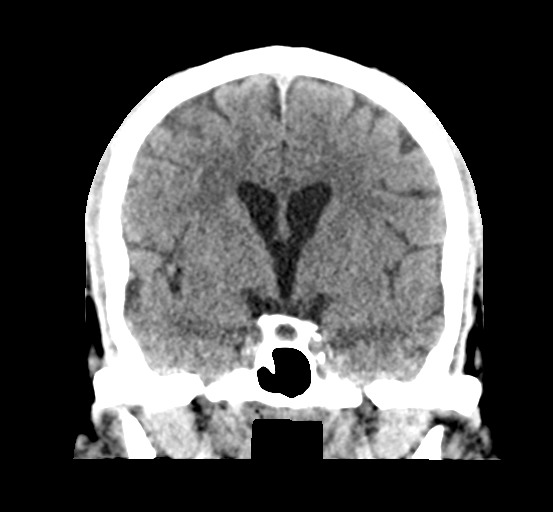
[im 41/73  brain]
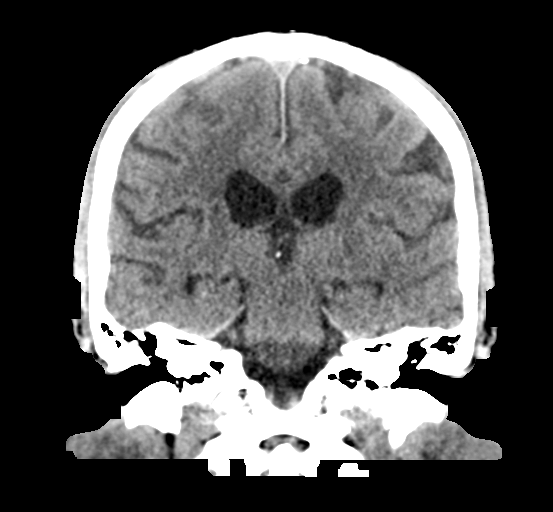

[Series 6: head without sag · sagittal · non-contrast · 0.36mm/px · 3 of 67 slices shown]
[im 23/67  brain]
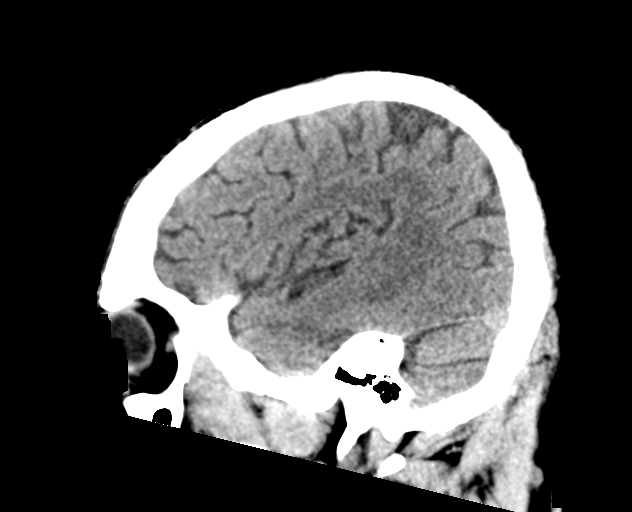
[im 34/67  brain]
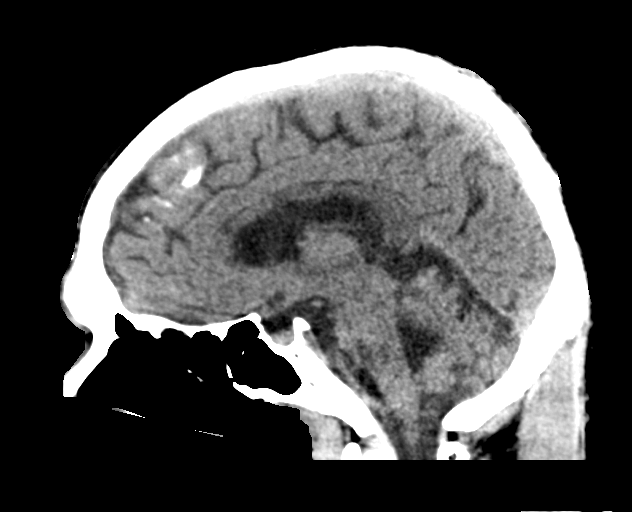
[im 45/67  brain]
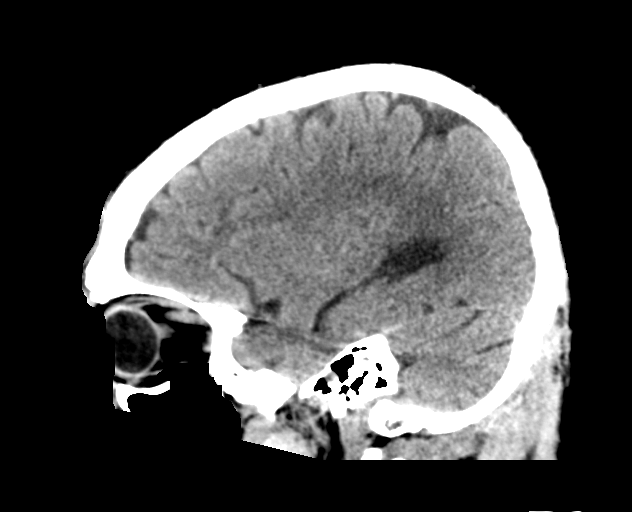

[17 of 47 positions shown; findings below may reference images not displayed]

FINDINGS: Brain: Delayed there is mild age-related atrophy and chronic
microvascular ischemic changes. There is no acute intracranial
hemorrhage. No mass effect or midline shift. No extra-axial fluid
collection.

Vascular: No hyperdense vessel or unexpected calcification.

Skull: Normal. Negative for fracture or focal lesion.

Sinuses/Orbits: No acute finding.

Other: None
IMPRESSION: 1. No acute intracranial hemorrhage.
2. Mild age-related atrophy and chronic microvascular ischemic
changes.

## 2021-10-06 ENCOUNTER — Telehealth (HOSPITAL_COMMUNITY): Payer: Self-pay

## 2021-10-06 NOTE — Telephone Encounter (Signed)
Called to confirm/remind patient of their appointment [facility] on 10[facility]07[facility].   Patient reminded to bring all medications and/or complete list.  Confirmed patient has transportation. Gave directions, instructed to utilize valet parking.  Confirmed appointment prior to ending call.   

## 2021-10-07 ENCOUNTER — Encounter (HOSPITAL_COMMUNITY): Payer: Self-pay

## 2021-10-07 ENCOUNTER — Ambulatory Visit (HOSPITAL_COMMUNITY)
Admission: RE | Admit: 2021-10-07 | Discharge: 2021-10-07 | Disposition: A | Discharge status: Home / Self Care | Payer: Medicaid Other | Source: Ambulatory Visit | Attending: Family Medicine | Admitting: Family Medicine

## 2021-10-07 VITALS — BP 110/68 | HR 86 | Wt 226.0 lb

## 2021-10-07 DIAGNOSIS — Z7901 Long term (current) use of anticoagulants: Secondary | ICD-10-CM | POA: Insufficient documentation

## 2021-10-07 DIAGNOSIS — I428 Other cardiomyopathies: Secondary | ICD-10-CM | POA: Insufficient documentation

## 2021-10-07 DIAGNOSIS — Z7984 Long term (current) use of oral hypoglycemic drugs: Secondary | ICD-10-CM | POA: Insufficient documentation

## 2021-10-07 DIAGNOSIS — I6340 Cerebral infarction due to embolism of unspecified cerebral artery: Secondary | ICD-10-CM | POA: Diagnosis not present

## 2021-10-07 DIAGNOSIS — Z794 Long term (current) use of insulin: Secondary | ICD-10-CM | POA: Diagnosis not present

## 2021-10-07 DIAGNOSIS — Z8673 Personal history of transient ischemic attack (TIA), and cerebral infarction without residual deficits: Secondary | ICD-10-CM | POA: Diagnosis not present

## 2021-10-07 DIAGNOSIS — Z79899 Other long term (current) drug therapy: Secondary | ICD-10-CM | POA: Diagnosis not present

## 2021-10-07 DIAGNOSIS — Z87891 Personal history of nicotine dependence: Secondary | ICD-10-CM | POA: Diagnosis not present

## 2021-10-07 DIAGNOSIS — I110 Hypertensive heart disease with heart failure: Secondary | ICD-10-CM | POA: Insufficient documentation

## 2021-10-07 DIAGNOSIS — R42 Dizziness and giddiness: Secondary | ICD-10-CM

## 2021-10-07 DIAGNOSIS — I5022 Chronic systolic (congestive) heart failure: Secondary | ICD-10-CM

## 2021-10-07 DIAGNOSIS — I10 Essential (primary) hypertension: Secondary | ICD-10-CM

## 2021-10-07 DIAGNOSIS — F1910 Other psychoactive substance abuse, uncomplicated: Secondary | ICD-10-CM

## 2021-10-07 DIAGNOSIS — E119 Type 2 diabetes mellitus without complications: Secondary | ICD-10-CM | POA: Diagnosis not present

## 2021-10-07 LAB — BRAIN NATRIURETIC PEPTIDE: B Natriuretic Peptide: 11.5 pg/mL (ref 0.0–100.0)

## 2021-10-07 LAB — CBC
HCT: 42.3 % (ref 39.0–52.0)
Hemoglobin: 13.4 g/dL (ref 13.0–17.0)
MCH: 26 pg (ref 26.0–34.0)
MCHC: 31.7 g/dL (ref 30.0–36.0)
MCV: 82.1 fL (ref 80.0–100.0)
Platelets: 199 10*3/uL (ref 150–400)
RBC: 5.15 MIL/uL (ref 4.22–5.81)
RDW: 13.7 % (ref 11.5–15.5)
WBC: 4.7 10*3/uL (ref 4.0–10.5)
nRBC: 0 % (ref 0.0–0.2)

## 2021-10-07 LAB — BASIC METABOLIC PANEL
Anion gap: 16 — ABNORMAL HIGH (ref 5–15)
BUN: 20 mg/dL (ref 6–20)
CO2: 18 mmol/L — ABNORMAL LOW (ref 22–32)
Calcium: 9.5 mg/dL (ref 8.9–10.3)
Chloride: 100 mmol/L (ref 98–111)
Creatinine, Ser: 1.41 mg/dL — ABNORMAL HIGH (ref 0.61–1.24)
GFR, Estimated: 59 mL/min — ABNORMAL LOW (ref >60)
Glucose, Bld: 275 mg/dL — ABNORMAL HIGH (ref 70–99)
Potassium: 4.2 mmol/L (ref 3.5–5.1)
Sodium: 134 mmol/L — ABNORMAL LOW (ref 135–145)

## 2021-10-07 NOTE — Progress Notes (Signed)
ADVANCED HF CLINIC NOTE PCP: [name], FNP Primary Cardiologist: Dr. Rennis Golden HF Cardiologist: Dr. Gala Romney    HPI: Kerry Hamilton is a 56 y.o. male with a past medial history significant for schizophrenia, tobacco/ETOH abuse, diabetes and systolic HF due to NICM with EF 15%.  Hospitalized in 5/20 with acute HF. Echo LVEF 15%  R/L cath - No CAD mildly elevated LV filling pressures. CI 2.2    Seen in ED 3/21 with recurrent HF in setting of poor compliance w/ home lasix and cocaine and ETOH use. He was treated w/ dose of IV Lasix in the ED w/ improvement in symptoms and did not require admission.  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 No Afib detected.  Echo w/ EF 15-20%. No thrombus identified. Carotid duplex negative. His cardiac meds were continued spiro, losartan, dig and lasix. Neurology started him on Eliquis. He was discharged to SNF at [facility]. Again, he was lost to f/u in the [facility].    Seen 3/22 by neurology for routine f/u and referred back to our office for ongoing cardiac care. Of note, neurology has recommended continuation of Eliquis until EF is > 30%.   Echo 07/07/21: EF 15-20% moderate RV dysfunction. Moderate MR   Today he returns for HF follow up. He lives at [facility] (SNF). Overall feeling fine. Works with PT at facility and is able to walk short distances with his cane without dyspnea. No further dizziness. Denies palpitations, CP, abnormal bleeding, edema, or PND/Orthopnea. Appetite ok. No fever or chills. Taking all medications provided by facility.   Cardiac Studies: - Echo (6/23): EF 15-20%, moderate RV dysfunction, moderate MR  - Echo (9/21): EF 15-20%, RV severely reduced (this was in setting of poor med compliance and cocaine use.   - Echo (5/20): EF 15%.  - LHC (5/20): no  CAD.   Past Medical History:  Diagnosis Date   Cocaine abuse (HCC)    CVA (cerebral vascular accident) (HCC) 09/2019   Depression    Diabetes mellitus without complication (HCC)    Systolic congestive heart failure (HCC)    Tobacco abuse    Current Outpatient Medications  Medication Sig Dispense Refill   apixaban (ELIQUIS) 5 MG TABS tablet Take 1 tablet (5 mg total) by mouth 2 (two) times daily. 60 tablet 0   atorvastatin (LIPITOR) 10 MG tablet Take 10 mg by mouth daily.     busPIRone (BUSPAR) 10 MG tablet Take 15 mg by mouth 2 (two) times daily.     carvedilol (COREG) 3.125 MG tablet Take 3.125 mg by mouth 2 (two) times daily with a meal.     [employer] SENNA PLUS 8.6-50 MG tablet Take 1 tablet by mouth 2 (two) times daily.     FLUoxetine (PROZAC) 20 MG capsule Take 1 capsule (20 mg total) by mouth daily. 30 capsule 0   furosemide (LASIX) 20 MG tablet Take 0.5 tablets (10 mg total) by mouth daily. 45 tablet 3   Insulin Aspart (NOVOLOG FLEXPEN [place]) Inject 0-10 Units into the skin See admin instructions. Per sliding scale. 71-150= 0u, 151-200= 2u, 201-250= 4u, 251-300= 6u, 301-350= 8u, 351-400= 10u     Insulin Glargine (BASAGLAR KWIKPEN) 100 UNIT/ML Inject 14 Units into the skin at bedtime.     JARDIANCE 25 MG TABS tablet Take  25 mg by mouth daily.     metFORMIN (GLUCOPHAGE) 500 MG tablet Take 500 mg by mouth 2 (two) times daily with a meal.     pantoprazole (PROTONIX) 40 MG tablet TAKE 1 TABLET(40 MG) BY MOUTH DAILY (Patient taking differently: Take 40 mg by mouth daily.) 90 tablet 0   risperiDONE (RISPERDAL) 1 MG tablet Take 1 mg by mouth at bedtime.     sacubitril-valsartan (ENTRESTO) 49-51 MG Take 1 tablet by mouth 2 (two) times daily. 180 tablet 3   spironolactone (ALDACTONE) 25 MG tablet Take 1 tablet (25 mg total) by mouth daily. 90 tablet 3   thiamine 100 MG tablet Take 1 tablet (100 mg total) by mouth daily. 30 tablet 0   meclizine (ANTIVERT) 25 MG tablet Take 25 mg by mouth 3 (three)  times daily. (Patient not taking: Reported on 10/07/2021)     No current facility-administered medications for this encounter.   No Known Allergies  Social History   Socioeconomic History   Marital status: Single    Spouse name: Not on file   Number of children: Not on file   Years of education: Not on file   Highest education level: Not on file  Occupational History   Not on file  Tobacco Use   Smoking status: Former    Packs/day: 1.00    Types: Cigarettes, Cigars   Smokeless tobacco: Never  Vaping Use   Vaping Use: Never used  Substance and Sexual Activity   Alcohol use: Yes    Alcohol/week: 6.0 standard drinks of alcohol    Types: 6 Cans of beer per week    Comment: daily   Drug use: Yes    Types: "Crack" cocaine   Sexual activity: Not Currently  Other Topics Concern   Not on file  Social History Narrative   Not on file   Social Determinants of Health   Financial Resource Strain: Not on file  Food Insecurity: Not on file  Transportation Needs: Not on file  Physical Activity: Not on file  Stress: Not on file  Social Connections: Not on file  Intimate Partner Violence: Not on file   Family History  Problem Relation Age of Onset   CAD Paternal Grandmother    Diabetes Mellitus II Neg Hx    BP 110/68   Pulse 86   Wt 102.5 kg (226 lb)   SpO2 93%   BMI 30.65 kg/m   Wt Readings from Last 3 Encounters:  10/07/21 102.5 kg (226 lb)  07/07/21 99.2 kg (218 lb 12.8 oz)  03/17/21 93.6 kg (206 lb 6.4 oz)   PHYSICAL EXAM: General:  NAD. No resp difficulty, arrived in [facility] HEENT: Normal Neck: Supple. No JVD. Carotids 2+ bilat; no bruits. No lymphadenopathy or thryomegaly appreciated. Cor: PMI nondisplaced. Regular rate & rhythm. No rubs, gallops, 2/6 MR Lungs: Clear Abdomen: Soft, nontender, nondistended. No hepatosplenomegaly. No bruits or masses. Good bowel sounds. Extremities: No cyanosis, clubbing, rash, edema Neuro: Alert & oriented x 3, cranial nerves  grossly intact. Moves all 4 extremities w/o difficulty. Affect pleasant. +expressive aphasia.  ECG (personally reviewed): NSR w/ inferolateral TWI, RBBB, 79 bpm  ASSESSMENT & PLAN: 1. Chronic Systolic HF due to NICM - Echo (5/20): EF 15% - LHC (5/20): no CAD.  - Echo (9/21): EF 15-20%, RV severely reduced (this was in setting of poor med compliance and cocaine use.  - Echo (11/06/20): EF 20-25% - Echo (6/23): EF 15-20% moderate RV dysfunction. Moderate MR  - Functional  status difficult as he primarily uses WC. But overall stable. Volume looks OK despite weight gain. - Continue Lasix 10 mg daily - Continue Jardiance. - Continue Entresto 49/51 mg bid. - Continue carvedilol 3.125 mg bid.    - Continue spiro 25 mg daily  - Not currently a good candidate for ICD - Labs today.  2. 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  - Neuro. Much imprioved. - Per neuro continue Eliquis for secondary prevention of cardiac thrombus formation, as long as EF remains < 30%. - Continue Eliquis. No bleeding.  - Check CBC  3. DM2 - Continue Jardiance  4. Former Polysubstance abuse  - No further use now residing at [facility].  5. HTN - Well-controlled. - GDMT as above.  6. Dizziness - Orthostatics negative at multiple visits - Suspect neurogenic due to previous CVA.  - Resolved.  Follow up in 4 months with Dr. Mickle Plumb, FNP  10/07/21

## 2021-10-07 NOTE — Patient Instructions (Signed)
EKG done today.  Labs done today. We will contact you only if your labs are abnormal.  No medication changes were made. Please continue all current medications as prescribed.  Your physician recommends that you schedule a follow-up appointment in: 4 months. Please contact our office in December to schedule a January 2024 appointment.   If you have any questions or concerns before your next appointment please send [place] a message through [place] or call our office at [phone].    TO LEAVE A MESSAGE FOR THE NURSE SELECT OPTION 2, PLEASE LEAVE A MESSAGE INCLUDING: YOUR NAME DATE OF BIRTH CALL BACK NUMBER REASON FOR CALL**this is important as we prioritize the call backs  YOU WILL RECEIVE A CALL BACK THE SAME DAY AS LONG AS YOU CALL BEFORE 4:00 PM   Do the following things EVERYDAY: Weigh yourself in the morning before breakfast. Write it down and keep it in a log. Take your medicines as prescribed Eat low salt foods--Limit salt (sodium) to 2000 mg per day.  Stay as active as you can everyday Limit all fluids for the day to less than 2 liters   At the [facility], you and your health needs are our priority. As part of our continuing mission to provide you with exceptional heart care, we have created designated Provider Care Teams. These Care Teams include your primary Cardiologist (physician) and Advanced Practice Providers (APPs- Physician Assistants and Nurse Practitioners) who all work together to provide you with the care you need, when you need it.   You may see any of the following providers on your designated Care Team at your next follow up: Dr Arvilla Meres Dr Carron Curie, NP Robbie Lis, [name] Karle Plumber, PharmD   Please be sure to bring in all your medications bottles to every appointment. '

## 2022-02-12 ENCOUNTER — Other Ambulatory Visit (HOSPITAL_COMMUNITY): Payer: Self-pay

## 2022-02-16 ENCOUNTER — Telehealth (HOSPITAL_COMMUNITY): Payer: Self-pay

## 2022-02-16 NOTE — Telephone Encounter (Signed)
Advanced Heart Failure Patient Advocate Encounter  Prior authorization is required for Entresto. PA submitted and APPROVED on 02/12/22.  [place] [employer] Lorin Glass [id number] W Effective: 02/12/22 - 02/12/23  Clista Bernhardt, CPhT Rx Patient Advocate Phone: [phone]

## 2022-07-16 ENCOUNTER — Other Ambulatory Visit (HOSPITAL_COMMUNITY): Payer: Self-pay

## 2022-07-16 MED ORDER — ENTRESTO 49-51 MG PO TABS
1.0000 | ORAL_TABLET | Freq: Two times a day (BID) | ORAL | 0 refills | Status: DC
Start: 2022-07-16 — End: 2023-06-08

## 2022-07-16 NOTE — Telephone Encounter (Signed)
Meds ordered this encounter  Medications   sacubitril-valsartan (ENTRESTO) 49-51 MG    Sig: Take 1 tablet by mouth 2 (two) times daily. Needs appt for further refills    Dispense:  180 tablet    Refill:  0

## 2023-01-10 NOTE — Patient Instructions (Signed)

## 2023-01-11 ENCOUNTER — Ambulatory Visit (INDEPENDENT_AMBULATORY_CARE_PROVIDER_SITE_OTHER): Payer: MEDICAID | Admitting: Nurse Practitioner

## 2023-01-11 ENCOUNTER — Encounter: Payer: Self-pay | Admitting: Nurse Practitioner

## 2023-01-11 VITALS — BP 113/69 | HR 69 | Ht 72.0 in | Wt 220.8 lb

## 2023-01-11 DIAGNOSIS — E1165 Type 2 diabetes mellitus with hyperglycemia: Secondary | ICD-10-CM

## 2023-01-11 DIAGNOSIS — E782 Mixed hyperlipidemia: Secondary | ICD-10-CM

## 2023-01-11 DIAGNOSIS — Z794 Long term (current) use of insulin: Secondary | ICD-10-CM

## 2023-01-11 DIAGNOSIS — Z7984 Long term (current) use of oral hypoglycemic drugs: Secondary | ICD-10-CM | POA: Diagnosis not present

## 2023-01-11 DIAGNOSIS — I10 Essential (primary) hypertension: Secondary | ICD-10-CM | POA: Diagnosis not present

## 2023-01-11 LAB — POCT GLYCOSYLATED HEMOGLOBIN (HGB A1C): Hemoglobin A1C: 10.4 % — AB (ref 4.0–5.6)

## 2023-01-11 MED ORDER — JARDIANCE 25 MG PO TABS: 25.0000 mg | ORAL_TABLET | Freq: Every day | ORAL | 3 refills | Status: DC

## 2023-01-11 MED ORDER — NOVOLOG FLEXPEN 100 UNIT/ML ~~LOC~~ SOPN: 5.0000 [IU] | PEN_INJECTOR | Freq: Three times a day (TID) | SUBCUTANEOUS | 3 refills | Status: DC

## 2023-01-11 MED ORDER — BASAGLAR KWIKPEN 100 UNIT/ML ~~LOC~~ SOPN: 30.0000 [IU] | PEN_INJECTOR | Freq: Every day | SUBCUTANEOUS | 3 refills | Status: DC

## 2023-01-11 MED ORDER — METFORMIN HCL 500 MG PO TABS: 500.0000 mg | ORAL_TABLET | Freq: Two times a day (BID) | ORAL | 3 refills | Status: DC

## 2023-01-11 NOTE — Progress Notes (Unsigned)
Endocrinology Consult Note       01/13/2023, 8:25 AM   Subjective:    Patient ID: Kerry Hamilton, male    DOB: [birth year].  Jeffry Riss is being seen in consultation for management of currently uncontrolled symptomatic diabetes requested by  [name], FNP.   Past Medical History:  Diagnosis Date   Cocaine abuse (HCC)    CVA (cerebral vascular accident) (HCC) 09/2019   Depression    Diabetes mellitus without complication (HCC)    Systolic congestive heart failure (HCC)    Tobacco abuse     Past Surgical History:  Procedure Laterality Date   RIGHT/LEFT HEART CATH AND CORONARY ANGIOGRAPHY N/A 06/13/2018   Procedure: RIGHT/LEFT HEART CATH AND CORONARY ANGIOGRAPHY;  Surgeon: [place], Peter M, MD;  Location: [facility] INVASIVE CV LAB;  Service: Cardiovascular;  Laterality: N/A;    Social History   Socioeconomic History   Marital status: Single    Spouse name: Not on file   Number of children: Not on file   Years of education: Not on file   Highest education level: Not on file  Occupational History   Not on file  Tobacco Use   Smoking status: Former    Current packs/day: 1.00    Types: Cigarettes, Cigars   Smokeless tobacco: Never  Vaping Use   Vaping status: Never Used  Substance and Sexual Activity   Alcohol use: Yes    Alcohol/week: 6.0 standard drinks of alcohol    Types: 6 Cans of beer per week    Comment: daily   Drug use: Yes    Types: "Crack" cocaine   Sexual activity: Not Currently  Other Topics Concern   Not on file  Social History Narrative   Not on file   Social Drivers of Health   Financial Resource Strain: Not on File (05/14/2021)   Received from [employer], [place]   [employer]    Financial Resource Strain: 0  Food Insecurity: Not on File (10/21/2022)   Received from [employer]    Food: 0  Transportation Needs: Not on File (05/14/2021)   Received from [employer],  [employer] Needs    Transportation: 0  Physical Activity: Not on File (05/14/2021)   Received from [place], [place]   Physical Activity    Physical Activity: 0  Stress: Not on File (05/14/2021)   Received from [facility], [place]   Stress    Stress: 0  Social Connections: Not on File (10/09/2022)   Received from [facility]   Social Connections    Connectedness: 0    Family History  Problem Relation Age of Onset   CAD Paternal Grandmother    Diabetes Mellitus II Neg Hx     Outpatient Encounter Medications as of 01/11/2023  Medication Sig   apixaban (ELIQUIS) 5 MG TABS tablet Take 1 tablet (5 mg total) by mouth 2 (two) times daily.   atorvastatin (LIPITOR) 10 MG tablet Take 10 mg by mouth daily.   busPIRone (BUSPAR) 10 MG tablet Take 15 mg by mouth 2 (two) times daily.   carvedilol (COREG) 3.125 MG tablet Take 3.125 mg by mouth 2 (two) times daily with a meal.  [employer] SENNA PLUS 8.6-50 MG tablet Take 1 tablet by mouth 2 (two) times daily.   FLUoxetine (PROZAC) 20 MG capsule Take 1 capsule (20 mg total) by mouth daily.   furosemide (LASIX) 20 MG tablet Take 0.5 tablets (10 mg total) by mouth daily.   latanoprost (XALATAN) 0.005 % ophthalmic solution 1 drop at bedtime.   meclizine (ANTIVERT) 25 MG tablet Take 25 mg by mouth 3 (three) times daily.   pantoprazole (PROTONIX) 40 MG tablet TAKE 1 TABLET(40 MG) BY MOUTH DAILY (Patient taking differently: Take 40 mg by mouth daily.)   risperiDONE (RISPERDAL) 1 MG tablet Take 1 mg by mouth at bedtime.   sacubitril-valsartan (ENTRESTO) 49-51 MG Take 1 tablet by mouth 2 (two) times daily. Needs appt for further refills   spironolactone (ALDACTONE) 25 MG tablet Take 1 tablet (25 mg total) by mouth daily.   thiamine 100 MG tablet Take 1 tablet (100 mg total) by mouth daily.   Timolol Maleate PF 0.5 % SOLN Apply to eye 2 (two) times daily.   [DISCONTINUED] Insulin Aspart (NOVOLOG FLEXPEN [place]) Inject 5-11 Units into the skin See admin instructions. Per  sliding scale. 90-150= 5u, 151-200= 6u, 201-250= 7u, 251-300= 8u, 301-350= 9u, 351-400= 10u, 400 or above 11 u   [DISCONTINUED] Insulin Glargine (BASAGLAR KWIKPEN) 100 UNIT/ML Inject 30 Units into the skin at bedtime.   [DISCONTINUED] JARDIANCE 25 MG TABS tablet Take 25 mg by mouth daily.   [DISCONTINUED] metFORMIN (GLUCOPHAGE) 500 MG tablet Take 500 mg by mouth 2 (two) times daily with a meal.   insulin aspart (NOVOLOG FLEXPEN) 100 UNIT/ML FlexPen Inject 5-11 Units into the skin 3 (three) times daily with meals. Per sliding scale. 90-150= 5u, 151-200= 6u, 201-250= 7u, 251-300= 8u, 301-350= 9u, 351-400= 10u, 400 or above 11 u   Insulin Glargine (BASAGLAR KWIKPEN) 100 UNIT/ML Inject 30 Units into the skin at bedtime.   JARDIANCE 25 MG TABS tablet Take 1 tablet (25 mg total) by mouth daily.   metFORMIN (GLUCOPHAGE) 500 MG tablet Take 1 tablet (500 mg total) by mouth 2 (two) times daily with a meal.   No facility-administered encounter medications on file as of 01/11/2023.    ALLERGIES: No Known Allergies  VACCINATION STATUS: Immunization History  Administered Date(s) Administered   Pneumococcal Polysaccharide-23 07/14/2018   Tdap 10/04/2018    Diabetes He presents for his initial diabetic visit. He has type 2 diabetes mellitus. Onset time: diagnosed at approx age of 41. His disease course has been worsening. Hypoglycemia symptoms include nervousness/anxiousness, sweats and tremors. Associated symptoms include fatigue, polydipsia and polyuria. There are no hypoglycemic complications. Diabetic complications include a CVA, heart disease (CHF), nephropathy and retinopathy. Risk factors for coronary artery disease include diabetes mellitus, male sex and sedentary lifestyle. Current diabetic treatment includes intensive insulin program and oral agent (dual therapy). He is compliant with treatment all of the time. His weight is fluctuating minimally. He is following a generally unhealthy diet. When  asked about meal planning, he reported none. He has not had a previous visit with a dietitian. He participates in exercise intermittently. His breakfast blood glucose range is generally 140-180 mg/dl. His lunch blood glucose range is generally 180-200 mg/dl. His dinner blood glucose range is generally >200 mg/dl. His bedtime blood glucose range is generally >200 mg/dl. His overall blood glucose range is >200 mg/dl. (He presents today for his consultation, accompanied by care assistant from the nursing home for which he resides, with his logs showing gross hyperglycemia overall.  His POCT A1c today is 10.4%, increasing from last A1c of 10% in August.  The nursing home is checking glucose 4 times daily, has him on a carb modified diet but he admits to snacking between meals and also consumes a lot of sweetened beverages like diet pepsi.  He does engage in routine physical activity by walking (is now walking without any assistive devices).  He is UTD on eye exam, sees podiatry routinely at the nursing home.) An ACE inhibitor/angiotensin II receptor blocker is being taken. He sees a podiatrist.Eye exam is current.     Review of systems  Constitutional: + Minimally fluctuating body weight, current Body mass index is 29.95 kg/m., no fatigue, no subjective hyperthermia, no subjective hypothermia Eyes: no blurry vision, no xerophthalmia ENT: no sore throat, no nodules palpated in throat, no dysphagia/odynophagia, no hoarseness Cardiovascular: no chest pain, no shortness of breath, no palpitations, no leg swelling Respiratory: no cough, no shortness of breath Gastrointestinal: no nausea/vomiting/diarrhea Musculoskeletal: no muscle/joint aches Skin: no rashes, no hyperemia Neurological: no tremors, no numbness, no tingling, no dizziness Psychiatric: no depression, no anxiety  Objective:     BP 113/69 (BP Location: Left Arm, Patient Position: Sitting, Cuff Size: Large)   Pulse 69   Ht 6' (1.829 m)   Wt  220 lb 12.8 oz (100.2 kg)   BMI 29.95 kg/m   Wt Readings from Last 3 Encounters:  01/11/23 220 lb 12.8 oz (100.2 kg)  10/07/21 226 lb (102.5 kg)  07/07/21 218 lb 12.8 oz (99.2 kg)     BP Readings from Last 3 Encounters:  01/11/23 113/69  10/07/21 110/68  07/14/21 (!) 142/85     Physical Exam- Limited  Constitutional:  Body mass index is 29.95 kg/m. , not in acute distress, normal state of mind Eyes:  EOMI, no exophthalmos Neck: Supple Cardiovascular: RRR, no murmurs, rubs, or gallops, no edema Respiratory: Adequate breathing efforts, no crackles, rales, rhonchi, or wheezing Musculoskeletal: no gross deformities, strength intact in all four extremities, no gross restriction of joint movements Skin:  no rashes, no hyperemia Neurological: no tremor with outstretched hands   Diabetic Foot Exam - Simple   No data filed      CMP ( most recent) CMP     Component Value Date/Time   NA 134 (L) 10/07/2021 1444   NA 138 08/07/2018 1557   K 4.2 10/07/2021 1444   CL 100 10/07/2021 1444   CO2 18 (L) 10/07/2021 1444   GLUCOSE 275 (H) 10/07/2021 1444   BUN 20 10/07/2021 1444   BUN 22 08/07/2018 1557   CREATININE 1.41 (H) 10/07/2021 1444   CALCIUM 9.5 10/07/2021 1444   PROT 7.8 07/07/2021 1442   ALBUMIN 4.3 07/07/2021 1442   AST 14 (L) 07/07/2021 1442   ALT 17 07/07/2021 1442   ALKPHOS 78 07/07/2021 1442   BILITOT 0.8 07/07/2021 1442   GFRNONAA 59 (L) 10/07/2021 1444     Diabetic Labs (most recent): Lab Results  Component Value Date   HGBA1C 10.4 (A) 01/11/2023   HGBA1C 6.3 (H) 10/17/2019   HGBA1C 6.5 (H) 10/12/2019     Lipid Panel ( most recent) Lipid Panel     Component Value Date/Time   CHOL 170 10/17/2019 0435   TRIG 88 10/17/2019 0435   HDL 50 10/17/2019 0435   CHOLHDL 3.4 10/17/2019 0435   VLDL 18 10/17/2019 0435   LDLCALC 102 (H) 10/17/2019 0435      Lab Results  Component Value Date   TSH  1.166 10/06/2018   TSH 1.403 07/13/2018   TSH 1.739  06/11/2018           Assessment & Plan:   1) Type 2 diabetes mellitus with hyperglycemia, with long-term current use of insulin (HCC) (Primary)  He presents today for his consultation, accompanied by care assistant from the nursing home for which he resides, with his logs showing gross hyperglycemia overall.  His POCT A1c today is 10.4%, increasing from last A1c of 10% in August.  The nursing home is checking glucose 4 times daily, has him on a carb modified diet but he admits to snacking between meals and also consumes a lot of sweetened beverages like diet pepsi.  He does engage in routine physical activity by walking (is now walking without any assistive devices).  He is UTD on eye exam, sees podiatry routinely at the nursing home.  - Creston Canuto has currently uncontrolled symptomatic type 2 DM since 57 years of age, with most recent A1c of 10.4 %.   -Recent labs reviewed.  - I had a long discussion with him about the progressive nature of diabetes and the pathology behind its complications. -his diabetes is complicated by CHF, CVA, retinopathy, CKD stage 3a and he remains at a high risk for more acute and chronic complications which include CAD, CVA, CKD, retinopathy, and neuropathy. These are all discussed in detail with him.  The following Lifestyle Medicine recommendations according to American College of Lifestyle Medicine [facility]) were discussed and offered to patient and he agrees to start the journey:  A. Whole Foods, Plant-based plate comprising of fruits and vegetables, plant-based proteins, whole-grain carbohydrates was discussed in detail with the patient.   A list for source of those nutrients were also provided to the patient.  Patient will use only water or unsweetened tea for hydration. B.  The need to stay away from risky substances including alcohol, smoking; obtaining 7 to 9 hours of restorative sleep, at least 150 minutes of moderate intensity exercise weekly, the importance  of healthy social connections,  and stress reduction techniques were discussed. C.  A full color page of  Calorie density of various food groups per pound showing examples of each food groups was provided to the patient.  - I have counseled him on diet and weight management by adopting a carbohydrate restricted/protein rich diet. Patient is encouraged to switch to unprocessed or minimally processed complex starch and increased protein intake (animal or plant source), fruits, and vegetables. -  he is advised to stick to a routine mealtimes to eat 3 meals a day and avoid unnecessary snacks (to snack only to correct hypoglycemia).   - he acknowledges that there is a room for improvement in his food and drink choices. - Suggestion is made for him to avoid simple carbohydrates from his diet including Cakes, Sweet Desserts, Ice Cream, Soda (diet and regular), Sweet Tea, Candies, Chips, Cookies, Store Bought Juices, Alcohol in Excess of 1-2 drinks a day, Artificial Sweeteners, Coffee Creamer, and "Sugar-free" Products. This will help patient to have more stable blood glucose profile and potentially avoid unintended weight gain.  - I have approached him with the following individualized plan to manage his diabetes and patient agrees:   -Will adjust his Basaglar to 30 units SQ nightly (instead of splitting into BID dosing) and adjust his Novolog to 5-11 units TID with meals if glucose is above 90 and he is eating (Specific instructions on how to titrate insulin dosage based on glucose readings  given to patient in writing).  -he is encouraged to start/continue monitoring glucose 4 times daily, before meals and before bed, to log their readings on the clinic sheets provided, and bring them to review at follow up appointment in 3 months.  - he is warned not to take insulin without proper monitoring per orders. - Adjustment parameters are given to him for hypo and hyperglycemia in writing. - he is encouraged to  call clinic for blood glucose levels less than 70 or above 300 mg /dl. - he is advised to continue Jardiance 25 mg po daily and Metformin 500 mg po twice daily, therapeutically suitable for patient.  If kidney function declines, will stop the Metformin in the future (will recheck CMP prior to next visit to assess).  - he will be considered for incretin therapy as appropriate next visit.  - Specific targets for  A1c; LDL, HDL, and Triglycerides were discussed with the patient.  2) Blood Pressure /Hypertension:  his blood pressure is controlled to target.   he is advised to continue his current medications as prescribed by his PCP.  3) Lipids/Hyperlipidemia:    There is no recent lipid panel available to review.  he is advised to continue Lipitor 10 mg daily at bedtime.  Side effects and precautions discussed with him.  4)  Weight/Diet:  his Body mass index is 29.95 kg/m.  -  clearly complicating his diabetes care.   he is a candidate for mild weight loss. I discussed with him the fact that loss of 5 - 10% of his  current body weight will have the most impact on his diabetes management.  Exercise, and detailed carbohydrates information provided  -  detailed on discharge instructions.  5) Chronic Care/Health Maintenance: -he is on ACEI/ARB and Statin medications and is encouraged to initiate and continue to follow up with Ophthalmology, Dentist, Podiatrist at least yearly or according to recommendations, and advised to stay away from smoking. I have recommended yearly flu vaccine and pneumonia vaccine at least every 5 years; moderate intensity exercise for up to 150 minutes weekly; and sleep for at least 7 hours a day.  - he is advised to maintain close follow up with [name], FNP for primary care needs, as well as his other providers for optimal and coordinated care.   - Time spent in this patient care: 60 min, of which > 50% was spent in counseling him about his diabetes and the rest  reviewing his blood glucose logs, discussing his hypoglycemia and hyperglycemia episodes, reviewing his current and previous labs/studies (including abstraction from other facilities) and medications doses and developing a long term treatment plan based on the latest standards of care/guidelines; and documenting his care.    Please refer to Patient Instructions for Blood Glucose Monitoring and Insulin/Medications Dosing Guide" in media tab for additional information. Please also refer to "Patient Self Inventory" in the Media tab for reviewed elements of pertinent patient history.  Joline Salt participated in the discussions, expressed understanding, and voiced agreement with the above plans.  All questions were answered to his satisfaction. he is encouraged to contact clinic should he have any questions or concerns prior to his return visit.     Follow up plan: - Return in about 3 months (around 04/11/2023) for Diabetes F/U with A1c in office, Previsit labs, Bring meter and logs.    Ronny Bacon, [facility] [address] [place], [place] [address] Phone: [phone] Fax: [phone]  01/13/2023, 8:25 AM

## 2023-04-13 LAB — COMPREHENSIVE METABOLIC PANEL
ALT: 16 IU/L (ref 0–44)
AST: 12 IU/L (ref 0–40)
Albumin: 4.5 g/dL (ref 3.8–4.9)
Alkaline Phosphatase: 139 IU/L — ABNORMAL HIGH (ref 44–121)
BUN/Creatinine Ratio: 13 (ref 10–24)
BUN: 19 mg/dL (ref 8–27)
Bilirubin Total: 0.2 mg/dL (ref 0.0–1.2)
CO2: 21 mmol/L (ref 20–29)
Calcium: 9.5 mg/dL (ref 8.6–10.2)
Chloride: 98 mmol/L (ref 96–106)
Creatinine, Ser: 1.41 mg/dL — ABNORMAL HIGH (ref 0.76–1.27)
Globulin, Total: 2.9 g/dL (ref 1.5–4.5)
Glucose: 329 mg/dL — ABNORMAL HIGH (ref 70–99)
Potassium: 4.9 mmol/L (ref 3.5–5.2)
Sodium: 136 mmol/L (ref 134–144)
Total Protein: 7.4 g/dL (ref 6.0–8.5)
eGFR: 57 mL/min/{1.73_m2} — ABNORMAL LOW (ref >59)

## 2023-04-19 ENCOUNTER — Ambulatory Visit (INDEPENDENT_AMBULATORY_CARE_PROVIDER_SITE_OTHER): Payer: MEDICAID | Admitting: Nurse Practitioner

## 2023-04-19 ENCOUNTER — Encounter: Payer: Self-pay | Admitting: Nurse Practitioner

## 2023-04-19 VITALS — BP 116/60 | HR 70 | Ht 72.0 in | Wt 212.2 lb

## 2023-04-19 DIAGNOSIS — Z7984 Long term (current) use of oral hypoglycemic drugs: Secondary | ICD-10-CM

## 2023-04-19 DIAGNOSIS — E782 Mixed hyperlipidemia: Secondary | ICD-10-CM

## 2023-04-19 DIAGNOSIS — Z794 Long term (current) use of insulin: Secondary | ICD-10-CM | POA: Diagnosis not present

## 2023-04-19 DIAGNOSIS — E1165 Type 2 diabetes mellitus with hyperglycemia: Secondary | ICD-10-CM

## 2023-04-19 DIAGNOSIS — I10 Essential (primary) hypertension: Secondary | ICD-10-CM

## 2023-04-19 LAB — POCT GLYCOSYLATED HEMOGLOBIN (HGB A1C): Hemoglobin A1C: 9.4 % — AB (ref 4.0–5.6)

## 2023-04-19 MED ORDER — BASAGLAR KWIKPEN 100 UNIT/ML ~~LOC~~ SOPN: 40.0000 [IU] | PEN_INJECTOR | Freq: Every day | SUBCUTANEOUS | 3 refills | Status: DC

## 2023-04-19 NOTE — Progress Notes (Signed)
 Endocrinology Follow Up Note       04/19/2023, 3:35 PM   Subjective:    Patient ID: Kerry Hamilton, male    DOB: 1965/05/15.  Kerry Hamilton is being seen in follow up after being seen in consultation for management of currently uncontrolled symptomatic diabetes requested by  Kerry Lye, FNP.   Past Medical History:  Diagnosis Date   Cocaine abuse (HCC)    CVA (cerebral vascular accident) (HCC) 09/2019   Depression    Diabetes mellitus without complication (HCC)    Systolic congestive heart failure (HCC)    Tobacco abuse     Past Surgical History:  Procedure Laterality Date   RIGHT/LEFT HEART CATH AND CORONARY ANGIOGRAPHY N/A 06/13/2018   Procedure: RIGHT/LEFT HEART CATH AND CORONARY ANGIOGRAPHY;  Surgeon: [place]az[place], Peter M, MD;  Location: [facility] INVASIVE CV LAB;  Service: Cardiovascular;  Laterality: N/A;    Social History   Socioeconomic History   Marital status: Single    Spouse name: Not on file   Number of children: Not on file   Years of education: Not on file   Highest education level: Not on file  Occupational History   Not on file  Tobacco Use   Smoking status: Former    Current packs/day: 1.00    Types: Cigarettes, Cigars   Smokeless tobacco: Never  Vaping Use   Vaping status: Never Used  Substance and Sexual Activity   Alcohol use: Yes    Alcohol/week: 6.0 standard drinks of alcohol    Types: 6 Cans of beer per week    Comment: daily   Drug use: Yes    Types: "Crack" cocaine   Sexual activity: Not Currently  Other Topics Concern   Not on file  Social History Narrative   Not on file   Social Drivers of Health   Financial Resource Strain: Not on File (05/14/2021)   Received from [employer], [place]   [employer]    Financial Resource Strain: 0  Food Insecurity: Not on File (10/21/2022)   Received from [employer]    Food: 0  Transportation Needs: Not on File  (05/14/2021)   Received from [employer], [employer] Needs    Transportation: 0  Physical Activity: Not on File (05/14/2021)   Received from [place], [place]   Physical Activity    Physical Activity: 0  Stress: Not on File (05/14/2021)   Received from [facility], [place]   Stress    Stress: 0  Social Connections: Not on File (10/09/2022)   Received from [facility]   Social Connections    Connectedness: 0    Family History  Problem Relation Age of Onset   CAD Paternal Grandmother    Diabetes Mellitus II Neg Hx     Outpatient Encounter Medications as of 04/19/2023  Medication Sig   apixaban (ELIQUIS) 5 MG TABS tablet Take 1 tablet (5 mg total) by mouth 2 (two) times daily.   atorvastatin (LIPITOR) 10 MG tablet Take 10 mg by mouth daily.   busPIRone (BUSPAR) 10 MG tablet Take 15 mg by mouth 2 (two) times daily.   carvedilol (COREG) 3.125 MG tablet Take 3.125 mg by mouth 2 (  two) times daily with a meal.   [employer] SENNA PLUS 8.6-50 MG tablet Take 1 tablet by mouth 2 (two) times daily.   FLUoxetine (PROZAC) 20 MG capsule Take 1 capsule (20 mg total) by mouth daily.   furosemide (LASIX) 20 MG tablet Take 0.5 tablets (10 mg total) by mouth daily.   insulin aspart (NOVOLOG FLEXPEN) 100 UNIT/ML FlexPen Inject 5-11 Units into the skin 3 (three) times daily with meals. Per sliding scale. 90-150= 5u, 151-200= 6u, 201-250= 7u, 251-300= 8u, 301-350= 9u, 351-400= 10u, 400 or above 11 u   JARDIANCE 25 MG TABS tablet Take 1 tablet (25 mg total) by mouth daily.   latanoprost (XALATAN) 0.005 % ophthalmic solution 1 drop at bedtime.   meclizine (ANTIVERT) 25 MG tablet Take 25 mg by mouth 3 (three) times daily.   metFORMIN (GLUCOPHAGE) 500 MG tablet Take 1 tablet (500 mg total) by mouth 2 (two) times daily with a meal.   pantoprazole (PROTONIX) 40 MG tablet TAKE 1 TABLET(40 MG) BY MOUTH DAILY (Patient taking differently: Take 40 mg by mouth daily.)   risperiDONE (RISPERDAL) 1 MG tablet Take 1 mg by mouth at bedtime.    sacubitril-valsartan (ENTRESTO) 49-51 MG Take 1 tablet by mouth 2 (two) times daily. Needs appt for further refills   spironolactone (ALDACTONE) 25 MG tablet Take 1 tablet (25 mg total) by mouth daily.   thiamine 100 MG tablet Take 1 tablet (100 mg total) by mouth daily.   Timolol Maleate PF 0.5 % SOLN Apply to eye 2 (two) times daily.   [DISCONTINUED] Insulin Glargine (BASAGLAR KWIKPEN) 100 UNIT/ML Inject 30 Units into the skin at bedtime.   Insulin Glargine (BASAGLAR KWIKPEN) 100 UNIT/ML Inject 40 Units into the skin at bedtime.   No facility-administered encounter medications on file as of 04/19/2023.    ALLERGIES: No Known Allergies  VACCINATION STATUS: Immunization History  Administered Date(s) Administered   Pneumococcal Polysaccharide-23 07/14/2018   Tdap 10/04/2018    Diabetes He presents for his follow-up diabetic visit. He has type 2 diabetes mellitus. Onset time: diagnosed at approx age of 9. His disease course has been improving. There are no hypoglycemic associated symptoms. Associated symptoms include fatigue, polydipsia and polyuria. There are no hypoglycemic complications. Symptoms are improving. Diabetic complications include a CVA, heart disease (CHF), nephropathy and retinopathy. Risk factors for coronary artery disease include diabetes mellitus, male sex and sedentary lifestyle. Current diabetic treatment includes intensive insulin program and oral agent (dual therapy). He is compliant with treatment all of the time. His weight is fluctuating minimally. He is following a generally unhealthy diet. When asked about meal planning, he reported none. He has not had a previous visit with a dietitian. He participates in exercise intermittently. His home blood glucose trend is decreasing steadily. His breakfast blood glucose range is generally 140-180 mg/dl. His lunch blood glucose range is generally 140-180 mg/dl. His dinner blood glucose range is generally >200 mg/dl. His  bedtime blood glucose range is generally >200 mg/dl. His overall blood glucose range is 180-200 mg/dl. (He presents today, accompanied by the care attendant from the nursing home, with his logs showing improved glycemic profile, yet still above target.  His POCT A1c today is 9.4%, improving from last visit of 10.4%.  He denies any hypoglycemia.  He notes he has been eating less at meal times.  ) An ACE inhibitor/angiotensin II receptor blocker is being taken. He sees a podiatrist.Eye exam is current.    Review of systems  Constitutional: +  decreasing body weight,  current Body mass index is 28.78 kg/m. , no fatigue, no subjective hyperthermia, no subjective hypothermia Eyes: no blurry vision, no xerophthalmia ENT: no sore throat, no nodules palpated in throat, no dysphagia/odynophagia, no hoarseness Cardiovascular: no chest pain, no shortness of breath, no palpitations, no leg swelling Respiratory: no cough, no shortness of breath Gastrointestinal: no nausea/vomiting/diarrhea Musculoskeletal: no muscle/joint aches Skin: no rashes, no hyperemia Neurological: no tremors, no numbness, no tingling, no dizziness Psychiatric: no depression, no anxiety  Objective:     BP 116/60 (BP Location: Left Arm, Patient Position: Sitting, Cuff Size: Large)   Pulse 70   Ht 6' (1.829 m)   Wt 212 lb 3.2 oz (96.3 kg)   BMI 28.78 kg/m   Wt Readings from Last 3 Encounters:  04/19/23 212 lb 3.2 oz (96.3 kg)  01/11/23 220 lb 12.8 oz (100.2 kg)  10/07/21 226 lb (102.5 kg)     BP Readings from Last 3 Encounters:  04/19/23 116/60  01/11/23 113/69  10/07/21 110/68      Physical Exam- Limited  Constitutional:  Body mass index is 28.78 kg/m. , not in acute distress, normal state of mind Eyes:  EOMI, no exophthalmos Musculoskeletal: no gross deformities, strength intact in all four extremities, no gross restriction of joint movements Skin:  no rashes, no hyperemia Neurological: no tremor with  outstretched hands   Diabetic Foot Exam - Simple   No data filed      CMP ( most recent) CMP     Component Value Date/Time   NA 136 04/12/2023 1429   K 4.9 04/12/2023 1429   CL 98 04/12/2023 1429   CO2 21 04/12/2023 1429   GLUCOSE 329 (H) 04/12/2023 1429   GLUCOSE 275 (H) 10/07/2021 1444   BUN 19 04/12/2023 1429   CREATININE 1.41 (H) 04/12/2023 1429   CALCIUM 9.5 04/12/2023 1429   PROT 7.4 04/12/2023 1429   ALBUMIN 4.5 04/12/2023 1429   AST 12 04/12/2023 1429   ALT 16 04/12/2023 1429   ALKPHOS 139 (H) 04/12/2023 1429   BILITOT 0.2 04/12/2023 1429   EGFR 57 (L) 04/12/2023 1429   GFRNONAA 59 (L) 10/07/2021 1444     Diabetic Labs (most recent): Lab Results  Component Value Date   HGBA1C 9.4 (A) 04/19/2023   HGBA1C 10.4 (A) 01/11/2023   HGBA1C 6.3 (H) 10/17/2019     Lipid Panel ( most recent) Lipid Panel     Component Value Date/Time   CHOL 170 10/17/2019 0435   TRIG 88 10/17/2019 0435   HDL 50 10/17/2019 0435   CHOLHDL 3.4 10/17/2019 0435   VLDL 18 10/17/2019 0435   LDLCALC 102 (H) 10/17/2019 0435      Lab Results  Component Value Date   TSH 1.166 10/06/2018   TSH 1.403 07/13/2018   TSH 1.739 06/11/2018           Assessment & Plan:   1) Type 2 diabetes mellitus with hyperglycemia, with long-term current use of insulin (HCC) (Primary)  He presents today, accompanied by the care attendant from the nursing home, with his logs showing improved glycemic profile, yet still above target.  His POCT A1c today is 9.4%, improving from last visit of 10.4%.  He denies any hypoglycemia.  He notes he has been eating less at meal times.    - Kerry Hamilton has currently uncontrolled symptomatic type 2 DM since 58 years of age.   -Recent labs reviewed.  Kidney function is stable.  - I  had a long discussion with him about the progressive nature of diabetes and the pathology behind its complications. -his diabetes is complicated by CHF, CVA, retinopathy, CKD stage 3a  and he remains at a high risk for more acute and chronic complications which include CAD, CVA, CKD, retinopathy, and neuropathy. These are all discussed in detail with him.  The following Lifestyle Medicine recommendations according to American College of Lifestyle Medicine [facility]) were discussed and offered to patient and he agrees to start the journey:  A. Whole Foods, Plant-based plate comprising of fruits and vegetables, plant-based proteins, whole-grain carbohydrates was discussed in detail with the patient.   A list for source of those nutrients were also provided to the patient.  Patient will use only water or unsweetened tea for hydration. B.  The need to stay away from risky substances including alcohol, smoking; obtaining 7 to 9 hours of restorative sleep, at least 150 minutes of moderate intensity exercise weekly, the importance of healthy social connections,  and stress reduction techniques were discussed. C.  A full color page of  Calorie density of various food groups per pound showing examples of each food groups was provided to the patient.  - Nutritional counseling repeated at each appointment due to patients tendency to fall back in to old habits.  - The patient admits there is a room for improvement in their diet and drink choices. -  Suggestion is made for the patient to avoid simple carbohydrates from their diet including Cakes, Sweet Desserts / Pastries, Ice Cream, Soda (diet and regular), Sweet Tea, Candies, Chips, Cookies, Sweet Pastries, Store Bought Juices, Alcohol in Excess of 1-2 drinks a day, Artificial Sweeteners, Coffee Creamer, and "Sugar-free" Products. This will help patient to have stable blood glucose profile and potentially avoid unintended weight gain.   - I encouraged the patient to switch to unprocessed or minimally processed complex starch and increased protein intake (animal or plant source), fruits, and vegetables.   - Patient is advised to stick to a routine  mealtimes to eat 3 meals a day and avoid unnecessary snacks (to snack only to correct hypoglycemia).  - I have approached him with the following individualized plan to manage his diabetes and patient agrees:   -Will adjust his Basaglar to 40 units SQ nightly and continue his Novolog to 5-11 units TID with meals if glucose is above 90 and he is eating (Specific instructions on how to titrate insulin dosage based on glucose readings given to patient in writing).  He can also continue Jardiance 25 mg po daily and Metformin 500 mg po twice daily for now (since kidneys are stable).  -he is encouraged to start/continue monitoring glucose 4 times daily, before meals and before bed, and to call the clinic if he has readings less than 70 or above 300 for 3 tests in a row.  - he is warned not to take insulin without proper monitoring per orders. - Adjustment parameters are given to him for hypo and hyperglycemia in writing.  - he will be considered for incretin therapy as appropriate next visit.  - Specific targets for  A1c; LDL, HDL, and Triglycerides were discussed with the patient.  2) Blood Pressure /Hypertension:  his blood pressure is controlled to target.   he is advised to continue his current medications as prescribed by his PCP.  3) Lipids/Hyperlipidemia:    There is no recent lipid panel available to review.  he is advised to continue Lipitor 10 mg daily at bedtime.  Side effects and precautions discussed with him.  4)  Weight/Diet:  his Body mass index is 28.78 kg/m.  -  clearly complicating his diabetes care.   he is a candidate for mild weight loss. I discussed with him the fact that loss of 5 - 10% of his  current body weight will have the most impact on his diabetes management.  Exercise, and detailed carbohydrates information provided  -  detailed on discharge instructions.  5) Chronic Care/Health Maintenance: -he is on ACEI/ARB and Statin medications and is encouraged to initiate  and continue to follow up with Ophthalmology, Dentist, Podiatrist at least yearly or according to recommendations, and advised to stay away from smoking. I have recommended yearly flu vaccine and pneumonia vaccine at least every 5 years; moderate intensity exercise for up to 150 minutes weekly; and sleep for at least 7 hours a day.  - he is advised to maintain close follow up with Kerry Lye, FNP for primary care needs, as well as his other providers for optimal and coordinated care.     I spent  33  minutes in the care of the patient today including review of labs from CMP, Lipids, Thyroid Function, Hematology (current and previous including abstractions from other facilities); face-to-face time discussing  his blood glucose readings/logs, discussing hypoglycemia and hyperglycemia episodes and symptoms, medications doses, his options of short and long term treatment based on the latest standards of care / guidelines;  discussion about incorporating lifestyle medicine;  and documenting the encounter. Risk reduction counseling performed per USPSTF guidelines to reduce obesity and cardiovascular risk factors.     Please refer to Patient Instructions for Blood Glucose Monitoring and Insulin/Medications Dosing Guide"  in media tab for additional information. Please  also refer to " Patient Self Inventory" in the Media  tab for reviewed elements of pertinent patient history.  Kerry Hamilton participated in the discussions, expressed understanding, and voiced agreement with the above plans.  All questions were answered to his satisfaction. he is encouraged to contact clinic should he have any questions or concerns prior to his return visit.     Follow up plan: - Return in about 3 months (around 07/20/2023) for Diabetes F/U with A1c in office, No previsit labs, Bring meter and logs.   Ronny Bacon, [facility] [address] [place], [place] [address] Phone:  [phone] Fax: [phone]  04/19/2023, 3:35 PM

## 2023-06-03 ENCOUNTER — Other Ambulatory Visit (HOSPITAL_COMMUNITY): Payer: Self-pay | Admitting: Internal Medicine

## 2023-07-11 ENCOUNTER — Ambulatory Visit (HOSPITAL_COMMUNITY): Payer: Self-pay | Admitting: Cardiology

## 2023-07-11 ENCOUNTER — Encounter (HOSPITAL_COMMUNITY): Payer: Self-pay

## 2023-07-11 ENCOUNTER — Ambulatory Visit (HOSPITAL_COMMUNITY)
Admission: RE | Admit: 2023-07-11 | Discharge: 2023-07-11 | Disposition: A | Discharge status: Home / Self Care | Payer: MEDICAID | Source: Ambulatory Visit | Attending: Cardiology | Admitting: Cardiology

## 2023-07-11 VITALS — BP 124/78 | HR 80 | Ht 72.0 in | Wt 215.8 lb

## 2023-07-11 DIAGNOSIS — Z794 Long term (current) use of insulin: Secondary | ICD-10-CM | POA: Diagnosis not present

## 2023-07-11 DIAGNOSIS — Z79899 Other long term (current) drug therapy: Secondary | ICD-10-CM | POA: Insufficient documentation

## 2023-07-11 DIAGNOSIS — I5022 Chronic systolic (congestive) heart failure: Secondary | ICD-10-CM | POA: Insufficient documentation

## 2023-07-11 DIAGNOSIS — E119 Type 2 diabetes mellitus without complications: Secondary | ICD-10-CM | POA: Diagnosis not present

## 2023-07-11 DIAGNOSIS — Z87891 Personal history of nicotine dependence: Secondary | ICD-10-CM | POA: Insufficient documentation

## 2023-07-11 DIAGNOSIS — Z7901 Long term (current) use of anticoagulants: Secondary | ICD-10-CM | POA: Insufficient documentation

## 2023-07-11 DIAGNOSIS — F209 Schizophrenia, unspecified: Secondary | ICD-10-CM | POA: Diagnosis not present

## 2023-07-11 DIAGNOSIS — Z8673 Personal history of transient ischemic attack (TIA), and cerebral infarction without residual deficits: Secondary | ICD-10-CM | POA: Diagnosis not present

## 2023-07-11 DIAGNOSIS — I428 Other cardiomyopathies: Secondary | ICD-10-CM | POA: Diagnosis not present

## 2023-07-11 DIAGNOSIS — I110 Hypertensive heart disease with heart failure: Secondary | ICD-10-CM | POA: Insufficient documentation

## 2023-07-11 DIAGNOSIS — I4510 Unspecified right bundle-branch block: Secondary | ICD-10-CM | POA: Diagnosis not present

## 2023-07-11 DIAGNOSIS — F1011 Alcohol abuse, in remission: Secondary | ICD-10-CM | POA: Insufficient documentation

## 2023-07-11 DIAGNOSIS — Z7984 Long term (current) use of oral hypoglycemic drugs: Secondary | ICD-10-CM | POA: Diagnosis not present

## 2023-07-11 LAB — BASIC METABOLIC PANEL WITH GFR
Anion gap: 12 (ref 5–15)
BUN: 16 mg/dL (ref 6–20)
CO2: 21 mmol/L — ABNORMAL LOW (ref 22–32)
Calcium: 9.2 mg/dL (ref 8.9–10.3)
Chloride: 102 mmol/L (ref 98–111)
Creatinine, Ser: 1.37 mg/dL — ABNORMAL HIGH (ref 0.61–1.24)
GFR, Estimated: >60 mL/min (ref >60)
Glucose, Bld: 345 mg/dL — ABNORMAL HIGH (ref 70–99)
Potassium: 4.2 mmol/L (ref 3.5–5.1)
Sodium: 135 mmol/L (ref 135–145)

## 2023-07-11 LAB — CBC
HCT: 41.5 % (ref 39.0–52.0)
Hemoglobin: 13 g/dL (ref 13.0–17.0)
MCH: 25.3 pg — ABNORMAL LOW (ref 26.0–34.0)
MCHC: 31.3 g/dL (ref 30.0–36.0)
MCV: 80.7 fL (ref 80.0–100.0)
Platelets: 232 10*3/uL (ref 150–400)
RBC: 5.14 MIL/uL (ref 4.22–5.81)
RDW: 14.2 % (ref 11.5–15.5)
WBC: 4.5 10*3/uL (ref 4.0–10.5)
nRBC: 0 % (ref 0.0–0.2)

## 2023-07-11 LAB — BRAIN NATRIURETIC PEPTIDE: B Natriuretic Peptide: 15.2 pg/mL (ref 0.0–100.0)

## 2023-07-11 NOTE — Patient Instructions (Addendum)
 Good to see you today!  Your physician has requested that you have an echocardiogram. Echocardiography is a painless test that uses sound waves to create images of your heart. It provides your doctor with information about the size and shape of your heart and how well your heart's chambers and valves are working. This procedure takes approximately one hour. There are no restrictions for this procedure. Please do NOT wear cologne, perfume, aftershave, or lotions (deodorant is allowed). Please arrive 15 minutes prior to your appointment time.  Please note: We ask at that you not bring children with you during ultrasound (echo/ vascular) testing. Due to room size and safety concerns, children are not allowed in the ultrasound rooms during exams. Our front office staff cannot provide observation of children in our lobby area while testing is being conducted. An adult accompanying a patient to their appointment will only be allowed in the ultrasound room at the discretion of the ultrasound technician under special circumstances. We apologize for any inconvenience.  Labs done today, your results will be available in MyChart, we will contact you for abnormal readings.  Your physician recommends that you schedule a follow-up appointment  4 months (October)  with echocardiogram Call office in August to schedule an appointment  If you have any questions or concerns before your next appointment please send us  a message through [place] or call our office at [phone].    TO LEAVE A MESSAGE FOR THE NURSE SELECT OPTION 2, PLEASE LEAVE A MESSAGE INCLUDING: YOUR NAME DATE OF BIRTH CALL BACK NUMBER REASON FOR CALL**this is important as we prioritize the call backs  YOU WILL RECEIVE A CALL BACK THE SAME DAY AS LONG AS YOU CALL BEFORE 4:00 PM

## 2023-07-11 NOTE — Addendum Note (Signed)
 Encounter addended by: Lanitra Battaglini M, RN on: 07/11/2023 3:09 PM  Actions taken: Charge Capture section accepted

## 2023-07-11 NOTE — Progress Notes (Addendum)
 ADVANCED HF CLINIC NOTE PCP: Heron Lord, FNP Primary Cardiologist: Dr. Maximo Spar HF Cardiologist: Dr. Julane Ny    Reason for visit: f/u for chronic systolic heart failure   HPI: Kerry Hamilton is a 58 y.o. male with a past medial history significant for schizophrenia, tobacco/ETOH abuse, diabetes and systolic HF due to NICM with EF 15%.  Hospitalized in 5/20 with acute HF. Echo LVEF 15%  R/L cath - No CAD mildly elevated LV filling pressures. CI 2.2    Seen in ED 3/21 with recurrent HF in setting of poor compliance w/ home lasix  and cocaine and ETOH use. He was treated w/ dose of IV Lasix  in the ED w/ improvement in symptoms and did not require admission.  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 No Afib detected.  Echo w/ EF 15-20%. No thrombus identified. Carotid duplex negative. His cardiac meds were continued spiro, losartan , dig and lasix . Neurology started him on Eliquis . He was discharged to SNF at [facility]. Again, he was lost to f/u in the [facility].    Seen 3/22 by neurology for routine f/u and referred back to our office for ongoing cardiac care. Of note, neurology has recommended continuation of Eliquis  until EF is > 30%.   Echo 07/07/21: EF 15-20% moderate RV dysfunction. Moderate MR   Today he returns for HF follow up. Hasn't been seen since 2023. Currently residing at [facility]. Today is his 58th birthday.  Getting around ok, still w/ residual weakness from stroke and speech difficulty but able to ambulate w/o any assistive devices, though moves slowly. No dyspnea w/ ambulation. Denies orthopnea/PND. No Tajuan. Per review of ALF MAR, he is still getting all of the previously prescribed cardiac meds. Remains on Eliquis  + HF GDMT.  He notes some occasional positional dizziness but no syncope/ near  syncope. No falls. Denies abnormal bleeding w/ Eliquis . No GU symptoms w/ Jardiance . BP is well controlled, 124/78. EKG shows NSR 80 bpm.                                          Cardiac Studies: - Echo (6/23): EF 15-20%, moderate RV dysfunction, moderate MR  - Echo (9/21): EF 15-20%, RV severely reduced (this was in setting of poor med compliance and cocaine use.   - Echo (5/20): EF 15%.  - LHC (5/20): no CAD.   Past Medical History:  Diagnosis Date   Cocaine abuse (HCC)    CVA (cerebral vascular accident) (HCC) 09/2019   Depression    Diabetes mellitus without complication (HCC)    Systolic congestive heart failure (HCC)    Tobacco abuse    Current Outpatient Medications  Medication Sig Dispense Refill   atorvastatin  (LIPITOR) 10 MG tablet Take 10 mg by mouth daily.     busPIRone (BUSPAR) 10 MG tablet Take 15 mg by mouth 2 (two) times daily.     carvedilol  (COREG ) 3.125 MG tablet Take 3.125 mg by mouth 2 (two) times daily with a meal.     [employer] SENNA PLUS 8.6-50 MG tablet Take 1 tablet by mouth 2 (two) times daily.     ENTRESTO  49-51 MG TAKE 1 TABLET BY MOUTH TWICE DAILY 180 tablet 0  FLUoxetine  (PROZAC ) 20 MG capsule Take 1 capsule (20 mg total) by mouth daily. 30 capsule 0   furosemide  (LASIX ) 20 MG tablet Take 0.5 tablets (10 mg total) by mouth daily. 45 tablet 3   insulin  aspart (NOVOLOG  FLEXPEN) 100 UNIT/ML FlexPen Inject 5-11 Units into the skin 3 (three) times daily with meals. Per sliding scale. 90-150= 5u, 151-200= 6u, 201-250= 7u, 251-300= 8u, 301-350= 9u, 351-400= 10u, 400 or above 11 u 30 mL 3   Insulin  Glargine (BASAGLAR  KWIKPEN) 100 UNIT/ML Inject 40 Units into the skin at bedtime. 30 mL 3   JARDIANCE  25 MG TABS tablet Take 1 tablet (25 mg total) by mouth daily. 90 tablet 3   latanoprost (XALATAN) 0.005 % ophthalmic solution 1 drop at bedtime.     meclizine  (ANTIVERT ) 25 MG tablet Take 25 mg by mouth 3 (three) times daily.     metFORMIN  (GLUCOPHAGE ) 500 MG tablet  Take 1 tablet (500 mg total) by mouth 2 (two) times daily with a meal. 180 tablet 3   pantoprazole  (PROTONIX ) 40 MG tablet TAKE 1 TABLET(40 MG) BY MOUTH DAILY 90 tablet 0   risperiDONE  (RISPERDAL ) 1 MG tablet Take 1 mg by mouth at bedtime.     spironolactone  (ALDACTONE ) 25 MG tablet Take 1 tablet (25 mg total) by mouth daily. 90 tablet 3   thiamine  100 MG tablet Take 1 tablet (100 mg total) by mouth daily. 30 tablet 0   Timolol Maleate PF 0.5 % SOLN Apply to eye 2 (two) times daily.     apixaban  (ELIQUIS ) 5 MG TABS tablet Take 1 tablet (5 mg total) by mouth 2 (two) times daily. 60 tablet 0   No current facility-administered medications for this encounter.   No Known Allergies  Social History   Socioeconomic History   Marital status: Single    Spouse name: Not on file   Number of children: Not on file   Years of education: Not on file   Highest education level: Not on file  Occupational History   Not on file  Tobacco Use   Smoking status: Former    Current packs/day: 1.00    Types: Cigarettes, Cigars   Smokeless tobacco: Never  Vaping Use   Vaping status: Never Used  Substance and Sexual Activity   Alcohol use: Yes    Alcohol/week: 6.0 standard drinks of alcohol    Types: 6 Cans of beer per week    Comment: daily   Drug use: Yes    Types: Crack cocaine   Sexual activity: Not Currently  Other Topics Concern   Not on file  Social History Narrative   Not on file   Social Drivers of Health   Financial Resource Strain: Not on File (05/14/2021)   Received from [employer]    Financial Resource Strain: 0  Food Insecurity: Not on File (10/21/2022)   Received from [facility] Insecurity    Food: 0  Transportation Needs: Not on File (05/14/2021)   Received from [employer] Needs    Transportation: 0  Physical Activity: Not on File (05/14/2021)   Received from [facility]   Physical Activity    Physical Activity: 0  Stress: Not on File  (05/14/2021)   Received from [facility]   Stress    Stress: 0  Social Connections: Not on File (10/09/2022)   Received from [employer] [facility]   Social Connections    Connectedness: 0  Intimate Partner Violence: Not on file  Family History  Problem Relation Age of Onset   CAD Paternal Grandmother    Diabetes Mellitus II Neg Hx    BP 124/78   Pulse 80   Ht 6' (1.829 m)   Wt 97.9 kg (215 lb 12.8 oz)   SpO2 96%   BMI 29.27 kg/m   Wt Readings from Last 3 Encounters:  07/11/23 97.9 kg (215 lb 12.8 oz)  04/19/23 96.3 kg (212 lb 3.2 oz)  01/11/23 100.2 kg (220 lb 12.8 oz)   PHYSICAL EXAM: General:  Well appearing. No respiratory difficulty HEENT: normal Neck: supple. no JVD. Carotids 2+ bilat; no bruits. No lymphadenopathy or thyromegaly appreciated. Cor: PMI nondisplaced. Regular rate & rhythm. No rubs, gallops or murmurs. Lungs: clear Abdomen: soft, nontender, nondistended. No hepatosplenomegaly. No bruits or masses. Good bowel sounds. Extremities: no cyanosis, clubbing, rash, edema   ECG (personally reviewed):  NSR 80 bpm, RBBB   ASSESSMENT & PLAN: 1. Chronic Systolic HF due to NICM - Echo (5/20): EF 15% - LHC (5/20): no CAD.  - Echo (9/21): EF 15-20%, RV severely reduced (this was in setting of poor med compliance and cocaine use.  - Echo (11/06/20): EF 20-25% - Echo (6/23): EF 15-20% moderate RV dysfunction. Moderate MR  - Euvolemic on exam. No dyspnea w/ ALDs though not very active at baseline, moves very slowly given residual weakness post CVA - Continue Lasix  10 mg daily. Check BMP and BNP today  - Continue Jardiance  25 mg daily (DM dosing). Denies GU symptoms.  - Continue Entresto  49/51 mg bid. Will not increase given positional dizziness/ fall risk  - Continue Coreg  3.125 mg bid  - Continue spiro 25 mg daily  - Update Echo next visit  - previously felt to be poor ICD candidate. If EF remains < 30%, would be reasonable to consider. He is at ALF. No longer using drugs.   2. 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  - Per neuro continue Eliquis  for secondary prevention of cardiac thrombus formation, as long as EF remains < 30%. - Continue Eliquis  5 mg bid. Denies abnormal bleeding. Check CBC today   3. DM2 - followed by PCP  - Continue Jardiance  - on insulin  + metformin    4. Former Polysubstance abuse  - No further use now residing at ALF  5. HTN - controlled on current regimen - continue GDMT per above   F/u w/ Dr. Bensimhon in 3 months w/ repeat echo    Ruddy Corral, PA-C  07/11/23

## 2023-07-27 ENCOUNTER — Ambulatory Visit (INDEPENDENT_AMBULATORY_CARE_PROVIDER_SITE_OTHER): Payer: MEDICAID | Admitting: Nurse Practitioner

## 2023-07-27 ENCOUNTER — Encounter: Payer: Self-pay | Admitting: Nurse Practitioner

## 2023-07-27 VITALS — BP 108/74 | HR 64 | Ht 72.0 in | Wt 213.8 lb

## 2023-07-27 DIAGNOSIS — Z794 Long term (current) use of insulin: Secondary | ICD-10-CM

## 2023-07-27 DIAGNOSIS — E782 Mixed hyperlipidemia: Secondary | ICD-10-CM

## 2023-07-27 DIAGNOSIS — I10 Essential (primary) hypertension: Secondary | ICD-10-CM

## 2023-07-27 DIAGNOSIS — E1165 Type 2 diabetes mellitus with hyperglycemia: Secondary | ICD-10-CM | POA: Diagnosis not present

## 2023-07-27 DIAGNOSIS — Z7984 Long term (current) use of oral hypoglycemic drugs: Secondary | ICD-10-CM | POA: Diagnosis not present

## 2023-07-27 LAB — POCT GLYCOSYLATED HEMOGLOBIN (HGB A1C): Hemoglobin A1C: 8.9 % — AB (ref 4.0–5.6)

## 2023-07-27 MED ORDER — BASAGLAR KWIKPEN 100 UNIT/ML ~~LOC~~ SOPN: 45.0000 [IU] | PEN_INJECTOR | Freq: Every day | SUBCUTANEOUS | 3 refills | Status: DC

## 2023-07-27 NOTE — Progress Notes (Signed)
 Endocrinology Follow Up Note       07/27/2023, 3:55 PM   Subjective:    Patient ID: Kerry Hamilton, male    DOB: 1965-02-21.  Kerry Hamilton is being seen in follow up after being seen in consultation for management of currently uncontrolled symptomatic diabetes requested by  Barbaraann Harvey, FNP.   Past Medical History:  Diagnosis Date   Cocaine abuse (HCC)    CVA (cerebral vascular accident) (HCC) 09/2019   Depression    Diabetes mellitus without complication (HCC)    Systolic congestive heart failure (HCC)    Tobacco abuse     Past Surgical History:  Procedure Laterality Date   RIGHT/LEFT HEART CATH AND CORONARY ANGIOGRAPHY N/A 06/13/2018   Procedure: RIGHT/LEFT HEART CATH AND CORONARY ANGIOGRAPHY;  Surgeon: [place]azil[place], Peter M, MD;  Location: [facility] INVASIVE CV LAB;  Service: Cardiovascular;  Laterality: N/A;    Social History   Socioeconomic History   Marital status: Single    Spouse name: Not on file   Number of children: Not on file   Years of education: Not on file   Highest education level: Not on file  Occupational History   Not on file  Tobacco Use   Smoking status: Former    Current packs/day: 1.00    Types: Cigarettes, Cigars   Smokeless tobacco: Never  Vaping Use   Vaping status: Never Used  Substance and Sexual Activity   Alcohol use: Yes    Alcohol/week: 6.0 standard drinks of alcohol    Types: 6 Cans of beer per week    Comment: daily   Drug use: Yes    Types: Crack cocaine   Sexual activity: Not Currently  Other Topics Concern   Not on file  Social History Narrative   Not on file   Social Drivers of Health   Financial Resource Strain: Not on File (05/14/2021)   Received from [employer]    Financial Resource Strain: 0  Food Insecurity: Not on File (10/21/2022)   Received from [facility] Insecurity    Food: 0  Transportation Needs: Not on File (05/14/2021)    Received from [employer] Needs    Transportation: 0  Physical Activity: Not on File (05/14/2021)   Received from [facility]   Physical Activity    Physical Activity: 0  Stress: Not on File (05/14/2021)   Received from [facility]   Stress    Stress: 0  Social Connections: Not on File (10/09/2022)   Received from [facility]   Social Connections    Connectedness: 0    Family History  Problem Relation Age of Onset   CAD Paternal Grandmother    Diabetes Mellitus II Neg Hx     Outpatient Encounter Medications as of 07/27/2023  Medication Sig   apixaban  (ELIQUIS ) 5 MG TABS tablet Take 1 tablet (5 mg total) by mouth 2 (two) times daily.   atorvastatin  (LIPITOR) 10 MG tablet Take 10 mg by mouth daily.   benztropine (COGENTIN) 1 MG tablet Take 1 mg by mouth at bedtime.   busPIRone (BUSPAR) 10 MG tablet Take 15 mg by mouth 2 (two) times daily.   carvedilol  (  COREG ) 3.125 MG tablet Take 3.125 mg by mouth 2 (two) times daily with a meal.   [employer]S SENNA PLUS 8.6-50 MG tablet Take 1 tablet by mouth 2 (two) times daily.   ENTRESTO  49-51 MG TAKE 1 TABLET BY MOUTH TWICE DAILY   FLUoxetine  (PROZAC ) 20 MG capsule Take 1 capsule (20 mg total) by mouth daily.   furosemide  (LASIX ) 20 MG tablet Take 0.5 tablets (10 mg total) by mouth daily.   insulin  aspart (NOVOLOG  FLEXPEN) 100 UNIT/ML FlexPen Inject 5-11 Units into the skin 3 (three) times daily with meals. Per sliding scale. 90-150= 5u, 151-200= 6u, 201-250= 7u, 251-300= 8u, 301-350= 9u, 351-400= 10u, 400 or above 11 u   JARDIANCE  25 MG TABS tablet Take 1 tablet (25 mg total) by mouth daily.   latanoprost (XALATAN) 0.005 % ophthalmic solution 1 drop at bedtime.   meclizine  (ANTIVERT ) 25 MG tablet Take 25 mg by mouth 3 (three) times daily.   metFORMIN  (GLUCOPHAGE ) 500 MG tablet Take 1 tablet (500 mg total) by mouth 2 (two) times daily with a meal.   pantoprazole  (PROTONIX ) 40 MG tablet TAKE 1 TABLET(40 MG) BY MOUTH DAILY   Polyethylene Glycol 3350  POWD by Does  not apply route as needed.   risperiDONE  (RISPERDAL ) 1 MG tablet Take 1 mg by mouth at bedtime.   spironolactone  (ALDACTONE ) 25 MG tablet Take 1 tablet (25 mg total) by mouth daily.   thiamine  100 MG tablet Take 1 tablet (100 mg total) by mouth daily.   Timolol Maleate PF 0.5 % SOLN Apply to eye 2 (two) times daily.   [DISCONTINUED] Insulin  Glargine (BASAGLAR  KWIKPEN) 100 UNIT/ML Inject 40 Units into the skin at bedtime.   Insulin  Glargine (BASAGLAR  KWIKPEN) 100 UNIT/ML Inject 45 Units into the skin at bedtime.   No facility-administered encounter medications on file as of 07/27/2023.    ALLERGIES: No Known Allergies  VACCINATION STATUS: Immunization History  Administered Date(s) Administered   Pneumococcal Polysaccharide-23 07/14/2018   Tdap 10/04/2018    Diabetes He presents for his follow-up diabetic visit. He has type 2 diabetes mellitus. Onset time: diagnosed at approx age of 23. His disease course has been improving. There are no hypoglycemic associated symptoms. Associated symptoms include fatigue, polydipsia and polyuria. There are no hypoglycemic complications. Symptoms are improving. Diabetic complications include a CVA, heart disease (CHF), nephropathy and retinopathy. Risk factors for coronary artery disease include diabetes mellitus, male sex and sedentary lifestyle. Current diabetic treatment includes intensive insulin  program and oral agent (dual therapy). He is compliant with treatment all of the time. His weight is fluctuating minimally. He is following a generally unhealthy diet. When asked about meal planning, he reported none. He has not had a previous visit with a dietitian. He participates in exercise intermittently. His home blood glucose trend is decreasing steadily. His breakfast blood glucose range is generally 140-180 mg/dl. His lunch blood glucose range is generally 180-200 mg/dl. His dinner blood glucose range is generally >200 mg/dl. His bedtime blood glucose range  is generally >200 mg/dl. His overall blood glucose range is 180-200 mg/dl. (He presents today, accompanied by the care attendant from the nursing home, with his logs showing improved glycemic profile, yet still above target.  His POCT A1c today is 8.9%, improving from last visit of 9.4%.  He denies any hypoglycemia.  ) An ACE inhibitor/angiotensin II receptor blocker is being taken. He sees a podiatrist.Eye exam is current.    Review of systems  Constitutional: + decreasing body weight,  current  Body mass index is 29 kg/m. , no fatigue, no subjective hyperthermia, no subjective hypothermia Eyes: no blurry vision, no xerophthalmia ENT: no sore throat, no nodules palpated in throat, no dysphagia/odynophagia, no hoarseness Cardiovascular: no chest pain, no shortness of breath, no palpitations, no leg swelling Respiratory: no cough, no shortness of breath Gastrointestinal: no nausea/vomiting/diarrhea Musculoskeletal: no muscle/joint aches Skin: no rashes, no hyperemia Neurological: no tremors, no numbness, no tingling, no dizziness Psychiatric: no depression, no anxiety  Objective:     BP 108/74 (BP Location: Left Arm, Patient Position: Sitting, Cuff Size: Large)   Pulse 64   Ht 6' (1.829 m)   Wt 213 lb 12.8 oz (97 kg)   BMI 29.00 kg/m   Wt Readings from Last 3 Encounters:  07/27/23 213 lb 12.8 oz (97 kg)  07/11/23 215 lb 12.8 oz (97.9 kg)  04/19/23 212 lb 3.2 oz (96.3 kg)     BP Readings from Last 3 Encounters:  07/27/23 108/74  07/11/23 124/78  04/19/23 116/60      Physical Exam- Limited  Constitutional:  Body mass index is 29 kg/m. , not in acute distress, normal state of mind Eyes:  EOMI, no exophthalmos Musculoskeletal: no gross deformities, strength intact in all four extremities, no gross restriction of joint movements Skin:  no rashes, no hyperemia Neurological: no tremor with outstretched hands   Diabetic Foot Exam - Simple   No data filed      CMP ( most  recent) CMP     Component Value Date/Time   NA 135 07/11/2023 1510   NA 136 04/12/2023 1429   K 4.2 07/11/2023 1510   CL 102 07/11/2023 1510   CO2 21 (L) 07/11/2023 1510   GLUCOSE 345 (H) 07/11/2023 1510   BUN 16 07/11/2023 1510   BUN 19 04/12/2023 1429   CREATININE 1.37 (H) 07/11/2023 1510   CALCIUM  9.2 07/11/2023 1510   PROT 7.4 04/12/2023 1429   ALBUMIN 4.5 04/12/2023 1429   AST 12 04/12/2023 1429   ALT 16 04/12/2023 1429   ALKPHOS 139 (H) 04/12/2023 1429   BILITOT 0.2 04/12/2023 1429   EGFR 57 (L) 04/12/2023 1429   GFRNONAA >60 07/11/2023 1510     Diabetic Labs (most recent): Lab Results  Component Value Date   HGBA1C 8.9 (A) 07/27/2023   HGBA1C 9.4 (A) 04/19/2023   HGBA1C 10.4 (A) 01/11/2023     Lipid Panel ( most recent) Lipid Panel     Component Value Date/Time   CHOL 170 10/17/2019 0435   TRIG 88 10/17/2019 0435   HDL 50 10/17/2019 0435   CHOLHDL 3.4 10/17/2019 0435   VLDL 18 10/17/2019 0435   LDLCALC 102 (H) 10/17/2019 0435      Lab Results  Component Value Date   TSH 1.166 10/06/2018   TSH 1.403 07/13/2018   TSH 1.739 06/11/2018           Assessment & Plan:   1) Type 2 diabetes mellitus with hyperglycemia, with long-term current use of insulin  (HCC) (Primary)   He presents today, accompanied by the care attendant from the nursing home, with his logs showing improved glycemic profile, yet still above target.  His POCT A1c today is 8.9%, improving from last visit of 9.4%.  He denies any hypoglycemia.    - Kerry Hamilton has currently uncontrolled symptomatic type 2 DM since 58 years of age.   -Recent labs reviewed.    - I had a long discussion with him about the progressive nature of  diabetes and the pathology behind its complications. -his diabetes is complicated by CHF, CVA, retinopathy, CKD stage 3a and he remains at a high risk for more acute and chronic complications which include CAD, CVA, CKD, retinopathy, and neuropathy. These are all  discussed in detail with him.  The following Lifestyle Medicine recommendations according to American College of Lifestyle Medicine [facility]) were discussed and offered to patient and he agrees to start the journey:  A. Whole Foods, Plant-based plate comprising of fruits and vegetables, plant-based proteins, whole-grain carbohydrates was discussed in detail with the patient.   A list for source of those nutrients were also provided to the patient.  Patient will use only water or unsweetened tea for hydration. B.  The need to stay away from risky substances including alcohol, smoking; obtaining 7 to 9 hours of restorative sleep, at least 150 minutes of moderate intensity exercise weekly, the importance of healthy social connections,  and stress reduction techniques were discussed. C.  A full color page of  Calorie density of various food groups per pound showing examples of each food groups was provided to the patient.  - Nutritional counseling repeated at each appointment due to patients tendency to fall back in to old habits.  - The patient admits there is a room for improvement in their diet and drink choices. -  Suggestion is made for the patient to avoid simple carbohydrates from their diet including Cakes, Sweet Desserts / Pastries, Ice Cream, Soda (diet and regular), Sweet Tea, Candies, Chips, Cookies, Sweet Pastries, Store Bought Juices, Alcohol in Excess of 1-2 drinks a day, Artificial Sweeteners, Coffee Creamer, and Sugar-free Products. This will help patient to have stable blood glucose profile and potentially avoid unintended weight gain.   - I encouraged the patient to switch to unprocessed or minimally processed complex starch and increased protein intake (animal or plant source), fruits, and vegetables.   - Patient is advised to stick to a routine mealtimes to eat 3 meals a day and avoid unnecessary snacks (to snack only to correct hypoglycemia).  - I have approached him with the  following individualized plan to manage his diabetes and patient agrees:   -Will adjust his Basaglar  to 45 units SQ nightly and continue his Novolog  5-11 units TID with meals if glucose is above 90 and he is eating (Specific instructions on how to titrate insulin  dosage based on glucose readings given to patient in writing).  He can also continue Jardiance  25 mg po daily and Metformin  500 mg po twice daily for now (since kidneys are stable).  -he is encouraged to start/continue monitoring glucose 4 times daily, before meals and before bed, and to call the clinic if he has readings less than 70 or above 300 for 3 tests in a row.  - he is warned not to take insulin  without proper monitoring per orders. - Adjustment parameters are given to him for hypo and hyperglycemia in writing.  - he will be considered for incretin therapy as appropriate next visit.  - Specific targets for  A1c; LDL, HDL, and Triglycerides were discussed with the patient.  2) Blood Pressure /Hypertension:  his blood pressure is controlled to target.   he is advised to continue his current medications as prescribed by his PCP.  3) Lipids/Hyperlipidemia:    There is no recent lipid panel available to review.  he is advised to continue Lipitor 10 mg daily at bedtime.  Side effects and precautions discussed with him.  Will check lipid  panel prior to next visit.  4)  Weight/Diet:  his Body mass index is 29 kg/m.  -  clearly complicating his diabetes care.   he is a candidate for mild weight loss. I discussed with him the fact that loss of 5 - 10% of his  current body weight will have the most impact on his diabetes management.  Exercise, and detailed carbohydrates information provided  -  detailed on discharge instructions.  5) Chronic Care/Health Maintenance: -he is on ACEI/ARB and Statin medications and is encouraged to initiate and continue to follow up with Ophthalmology, Dentist, Podiatrist at least yearly or according to  recommendations, and advised to stay away from smoking. I have recommended yearly flu vaccine and pneumonia vaccine at least every 5 years; moderate intensity exercise for up to 150 minutes weekly; and sleep for at least 7 hours a day.  - he is advised to maintain close follow up with Barbaraann Harvey, FNP for primary care needs, as well as his other providers for optimal and coordinated care.     I spent  26  minutes in the care of the patient today including review of labs from CMP, Lipids, Thyroid Function, Hematology (current and previous including abstractions from other facilities); face-to-face time discussing  his blood glucose readings/logs, discussing hypoglycemia and hyperglycemia episodes and symptoms, medications doses, his options of short and long term treatment based on the latest standards of care / guidelines;  discussion about incorporating lifestyle medicine;  and documenting the encounter. Risk reduction counseling performed per USPSTF guidelines to reduce obesity and cardiovascular risk factors.     Please refer to Patient Instructions for Blood Glucose Monitoring and Insulin /Medications Dosing Guide  in media tab for additional information. Please  also refer to  Patient Self Inventory in the Media  tab for reviewed elements of pertinent patient history.  Kerry Hamilton participated in the discussions, expressed understanding, and voiced agreement with the above plans.  All questions were answered to his satisfaction. he is encouraged to contact clinic should he have any questions or concerns prior to his return visit.     Follow up plan: - Return in about 4 months (around 11/27/2023) for Diabetes F/U with A1c in office, Previsit labs, Bring meter and logs.   Kerry Hamilton, [facility] [address] [place], [place] [address] Phone: [phone] Fax: [phone]  07/27/2023, 3:55 PM

## 2023-11-24 LAB — LIPID PANEL
Chol/HDL Ratio: 4.8 ratio (ref 0.0–5.0)
Cholesterol, Total: 125 mg/dL (ref 100–199)
HDL: 26 mg/dL — ABNORMAL LOW (ref >39)
LDL Chol Calc (NIH): 58 mg/dL (ref 0–99)
Triglycerides: 254 mg/dL — ABNORMAL HIGH (ref 0–149)
VLDL Cholesterol Cal: 41 mg/dL — ABNORMAL HIGH (ref 5–40)

## 2023-11-24 LAB — COMPREHENSIVE METABOLIC PANEL WITH GFR
ALT: 16 IU/L (ref 0–44)
AST: 13 IU/L (ref 0–40)
Albumin: 4.3 g/dL (ref 3.8–4.9)
Alkaline Phosphatase: 110 IU/L (ref 47–123)
BUN/Creatinine Ratio: 11 (ref 9–20)
BUN: 15 mg/dL (ref 6–24)
Bilirubin Total: 0.4 mg/dL (ref 0.0–1.2)
CO2: 20 mmol/L (ref 20–29)
Calcium: 9.9 mg/dL (ref 8.7–10.2)
Chloride: 100 mmol/L (ref 96–106)
Creatinine, Ser: 1.35 mg/dL — ABNORMAL HIGH (ref 0.76–1.27)
Globulin, Total: 2.5 g/dL (ref 1.5–4.5)
Glucose: 267 mg/dL — ABNORMAL HIGH (ref 70–99)
Potassium: 4.6 mmol/L (ref 3.5–5.2)
Sodium: 137 mmol/L (ref 134–144)
Total Protein: 6.8 g/dL (ref 6.0–8.5)
eGFR: 61 mL/min/1.73 (ref >59)

## 2023-11-24 LAB — T4, FREE: Free T4: 1.08 ng/dL (ref 0.82–1.77)

## 2023-11-24 LAB — TSH: TSH: 0.645 u[IU]/mL (ref 0.450–4.500)

## 2023-11-28 ENCOUNTER — Ambulatory Visit (INDEPENDENT_AMBULATORY_CARE_PROVIDER_SITE_OTHER): Payer: MEDICAID | Admitting: Nurse Practitioner

## 2023-11-28 ENCOUNTER — Encounter: Payer: Self-pay | Admitting: Nurse Practitioner

## 2023-11-28 VITALS — BP 106/72 | HR 72 | Ht 72.0 in | Wt 219.4 lb

## 2023-11-28 DIAGNOSIS — E1165 Type 2 diabetes mellitus with hyperglycemia: Secondary | ICD-10-CM

## 2023-11-28 DIAGNOSIS — E782 Mixed hyperlipidemia: Secondary | ICD-10-CM

## 2023-11-28 DIAGNOSIS — I10 Essential (primary) hypertension: Secondary | ICD-10-CM

## 2023-11-28 DIAGNOSIS — Z794 Long term (current) use of insulin: Secondary | ICD-10-CM

## 2023-11-28 DIAGNOSIS — Z7984 Long term (current) use of oral hypoglycemic drugs: Secondary | ICD-10-CM

## 2023-11-28 LAB — POCT GLYCOSYLATED HEMOGLOBIN (HGB A1C): Hemoglobin A1C: 10.1 % — AB (ref 4.0–5.6)

## 2023-11-28 MED ORDER — METFORMIN HCL 500 MG PO TABS: 500.0000 mg | ORAL_TABLET | Freq: Two times a day (BID) | ORAL | 3 refills | Status: AC

## 2023-11-28 MED ORDER — NOVOLOG FLEXPEN 100 UNIT/ML ~~LOC~~ SOPN: 10.0000 [IU] | PEN_INJECTOR | Freq: Three times a day (TID) | SUBCUTANEOUS | 3 refills | Status: AC

## 2023-11-28 MED ORDER — BASAGLAR KWIKPEN 100 UNIT/ML ~~LOC~~ SOPN: 45.0000 [IU] | PEN_INJECTOR | Freq: Every day | SUBCUTANEOUS | 3 refills | Status: AC

## 2023-11-28 MED ORDER — JARDIANCE 25 MG PO TABS: 25.0000 mg | ORAL_TABLET | Freq: Every day | ORAL | 3 refills | Status: DC

## 2023-11-28 NOTE — Progress Notes (Addendum)
 Endocrinology Follow Up Note       11/28/2023, 3:21 PM   Subjective:    Patient ID: [name], male    DOB: 18-Feb-1965.  Kerry Hamilton is being seen in follow up after being seen in consultation for management of currently uncontrolled symptomatic diabetes requested by  Barbaraann Harvey, FNP.   Past Medical History:  Diagnosis Date   Cocaine abuse (HCC)    CVA (cerebral vascular accident) (HCC) 09/2019   Depression    Diabetes mellitus without complication (HCC)    Systolic congestive heart failure (HCC)    Tobacco abuse     Past Surgical History:  Procedure Laterality Date   RIGHT/LEFT HEART CATH AND CORONARY ANGIOGRAPHY N/A 06/13/2018   Procedure: RIGHT/LEFT HEART CATH AND CORONARY ANGIOGRAPHY;  Surgeon: Jordan, Peter M, MD;  Location: [facility] INVASIVE CV LAB;  Service: Cardiovascular;  Laterality: N/A;    Social History   Socioeconomic History   Marital status: Single    Spouse name: Not on file   Number of children: Not on file   Years of education: Not on file   Highest education level: Not on file  Occupational History   Not on file  Tobacco Use   Smoking status: Former    Current packs/day: 1.00    Types: Cigarettes, Cigars   Smokeless tobacco: Never  Vaping Use   Vaping status: Never Used  Substance and Sexual Activity   Alcohol use: Yes    Alcohol/week: 6.0 standard drinks of alcohol    Types: 6 Cans of beer per week    Comment: daily   Drug use: Yes    Types: Crack cocaine   Sexual activity: Not Currently  Other Topics Concern   Not on file  Social History Narrative   Not on file   Social Drivers of Health   Financial Resource Strain: Not on File (05/14/2021)   Received from [employer]    Financial Resource Strain: 0  Food Insecurity: Not on File (10/21/2022)   Received from [facility] Insecurity    Food: 0  Transportation Needs: Not on File (05/14/2021)    Received from [employer] Needs    Transportation: 0  Physical Activity: Not on File (05/14/2021)   Received from [facility]   Physical Activity    Physical Activity: 0  Stress: Not on File (05/14/2021)   Received from [facility]   Stress    Stress: 0  Social Connections: Not on File (10/09/2022)   Received from [facility]   Social Connections    Connectedness: 0    Family History  Problem Relation Age of Onset   CAD Paternal Grandmother    Diabetes Mellitus II Neg Hx     Outpatient Encounter Medications as of 11/28/2023  Medication Sig   apixaban  (ELIQUIS ) 5 MG TABS tablet Take 1 tablet (5 mg total) by mouth 2 (two) times daily.   atorvastatin  (LIPITOR) 10 MG tablet Take 10 mg by mouth daily.   benztropine (COGENTIN) 1 MG tablet Take 1 mg by mouth at bedtime.   busPIRone (BUSPAR) 10 MG tablet Take 15 mg by mouth 2 (two) times daily.   carvedilol  (  COREG ) 3.125 MG tablet Take 3.125 mg by mouth 2 (two) times daily with a meal.   [employer] SENNA PLUS 8.6-50 MG tablet Take 1 tablet by mouth 2 (two) times daily.   ENTRESTO  49-51 MG TAKE 1 TABLET BY MOUTH TWICE DAILY   FLUoxetine  (PROZAC ) 20 MG capsule Take 1 capsule (20 mg total) by mouth daily.   furosemide  (LASIX ) 20 MG tablet Take 0.5 tablets (10 mg total) by mouth daily.   latanoprost (XALATAN) 0.005 % ophthalmic solution 1 drop at bedtime.   meclizine  (ANTIVERT ) 25 MG tablet Take 25 mg by mouth 3 (three) times daily.   pantoprazole  (PROTONIX ) 40 MG tablet TAKE 1 TABLET(40 MG) BY MOUTH DAILY   Polyethylene Glycol 3350  POWD by Does not apply route as needed.   risperiDONE  (RISPERDAL ) 1 MG tablet Take 1 mg by mouth at bedtime.   spironolactone  (ALDACTONE ) 25 MG tablet Take 1 tablet (25 mg total) by mouth daily.   thiamine  100 MG tablet Take 1 tablet (100 mg total) by mouth daily.   Timolol Maleate PF 0.5 % SOLN Apply to eye 2 (two) times daily.   [DISCONTINUED] insulin  aspart (NOVOLOG  FLEXPEN) 100 UNIT/ML FlexPen Inject 5-11 Units into the  skin 3 (three) times daily with meals. Per sliding scale. 90-150= 5u, 151-200= 6u, 201-250= 7u, 251-300= 8u, 301-350= 9u, 351-400= 10u, 400 or above 11 u   [DISCONTINUED] Insulin  Glargine (BASAGLAR  KWIKPEN) 100 UNIT/ML Inject 45 Units into the skin at bedtime.   [DISCONTINUED] JARDIANCE  25 MG TABS tablet Take 1 tablet (25 mg total) by mouth daily.   [DISCONTINUED] metFORMIN  (GLUCOPHAGE ) 500 MG tablet Take 1 tablet (500 mg total) by mouth 2 (two) times daily with a meal.   insulin  aspart (NOVOLOG  FLEXPEN) 100 UNIT/ML FlexPen Inject 10-16 Units into the skin 3 (three) times daily with meals. Per sliding scale. 90-150= 10u, 151-200= 11u, 201-250= 12u, 251-300= 13u, 301-350= 14u, 351-400= 15u, 400 or above 16 u   Insulin  Glargine (BASAGLAR  KWIKPEN) 100 UNIT/ML Inject 45 Units into the skin at bedtime.   JARDIANCE  25 MG TABS tablet Take 1 tablet (25 mg total) by mouth daily.   metFORMIN  (GLUCOPHAGE ) 500 MG tablet Take 1 tablet (500 mg total) by mouth 2 (two) times daily with a meal.   No facility-administered encounter medications on file as of 11/28/2023.    ALLERGIES: No Known Allergies  VACCINATION STATUS: Immunization History  Administered Date(s) Administered   Pneumococcal Polysaccharide-23 07/14/2018   Tdap 10/04/2018    Diabetes He presents for his follow-up diabetic visit. He has type 2 diabetes mellitus. Onset time: diagnosed at approx age of 2. His disease course has been worsening. There are no hypoglycemic associated symptoms. Associated symptoms include fatigue, polydipsia and polyuria. There are no hypoglycemic complications. Symptoms are stable. Diabetic complications include a CVA, heart disease (CHF), nephropathy and retinopathy. Risk factors for coronary artery disease include diabetes mellitus, male sex and sedentary lifestyle. Current diabetic treatment includes intensive insulin  program and oral agent (dual therapy). He is compliant with treatment all of the time. His weight  is fluctuating minimally. He is following a generally unhealthy diet. When asked about meal planning, he reported none. He has not had a previous visit with a dietitian. He participates in exercise intermittently. His home blood glucose trend is fluctuating dramatically. His breakfast blood glucose range is generally 140-180 mg/dl. His lunch blood glucose range is generally >200 mg/dl. His dinner blood glucose range is generally >200 mg/dl. His bedtime blood glucose range is generally >200  mg/dl. His overall blood glucose range is >200 mg/dl. (He presents today, accompanied by the care attendant from the nursing home, with his logs showing mostly above target glycemic profile. His POCT A1c today is 10.1%, increasing from last visit of 8.9%.  There are no episodes of hypoglycemia documented.  His care attendant reports he has been drinking more sodas recently.  ) An ACE inhibitor/angiotensin II receptor blocker is being taken. He sees a podiatrist.Eye exam is current.    Review of systems  Constitutional: + Minimally fluctuating body weight,  current Body mass index is 29.76 kg/m. , no fatigue, no subjective hyperthermia, no subjective hypothermia Eyes: no blurry vision, no xerophthalmia ENT: no sore throat, no nodules palpated in throat, no dysphagia/odynophagia, no hoarseness Cardiovascular: no chest pain, no shortness of breath, no palpitations, no leg swelling Respiratory: no cough, no shortness of breath Gastrointestinal: no nausea/vomiting/diarrhea Musculoskeletal: no muscle/joint aches Skin: no rashes, no hyperemia Neurological: no tremors, no numbness, no tingling, no dizziness Psychiatric: no depression, no anxiety  Objective:     BP 106/72 (BP Location: Left Arm, Patient Position: Sitting, Cuff Size: Large)   Pulse 72   Ht 6' (1.829 m)   Wt 219 lb 6.4 oz (99.5 kg)   BMI 29.76 kg/m   Wt Readings from Last 3 Encounters:  11/28/23 219 lb 6.4 oz (99.5 kg)  07/27/23 213 lb 12.8 oz (97  kg)  07/11/23 215 lb 12.8 oz (97.9 kg)     BP Readings from Last 3 Encounters:  11/28/23 106/72  07/27/23 108/74  07/11/23 124/78      Physical Exam- Limited  Constitutional:  Body mass index is 29.76 kg/m. , not in acute distress, normal state of mind Eyes:  EOMI, no exophthalmos Musculoskeletal: no gross deformities, strength intact in all four extremities, no gross restriction of joint movements Skin:  no rashes, no hyperemia Neurological: no tremor with outstretched hands   Diabetic Foot Exam - Simple   No data filed      CMP ( most recent) CMP     Component Value Date/Time   NA 137 11/23/2023 0905   K 4.6 11/23/2023 0905   CL 100 11/23/2023 0905   CO2 20 11/23/2023 0905   GLUCOSE 267 (H) 11/23/2023 0905   GLUCOSE 345 (H) 07/11/2023 1510   BUN 15 11/23/2023 0905   CREATININE 1.35 (H) 11/23/2023 0905   CALCIUM  9.9 11/23/2023 0905   PROT 6.8 11/23/2023 0905   ALBUMIN 4.3 11/23/2023 0905   AST 13 11/23/2023 0905   ALT 16 11/23/2023 0905   ALKPHOS 110 11/23/2023 0905   BILITOT 0.4 11/23/2023 0905   EGFR 61 11/23/2023 0905   GFRNONAA >60 07/11/2023 1510     Diabetic Labs (most recent): Lab Results  Component Value Date   HGBA1C 10.1 (A) 11/28/2023   HGBA1C 8.9 (A) 07/27/2023   HGBA1C 9.4 (A) 04/19/2023     Lipid Panel ( most recent) Lipid Panel     Component Value Date/Time   CHOL 125 11/23/2023 0905   TRIG 254 (H) 11/23/2023 0905   HDL 26 (L) 11/23/2023 0905   CHOLHDL 4.8 11/23/2023 0905   CHOLHDL 3.4 10/17/2019 0435   VLDL 18 10/17/2019 0435   LDLCALC 58 11/23/2023 0905   LABVLDL 41 (H) 11/23/2023 0905      Lab Results  Component Value Date   TSH 0.645 11/23/2023   TSH 1.166 10/06/2018   TSH 1.403 07/13/2018   TSH 1.739 06/11/2018   FREET4 1.08 11/23/2023  Assessment & Plan:   1) Type 2 diabetes mellitus with hyperglycemia, with long-term current use of insulin  (HCC) (Primary)  He presents today, accompanied by the  care attendant from the nursing home, with his logs showing mostly above target glycemic profile. His POCT A1c today is 10.1%, increasing from last visit of 8.9%.  There are no episodes of hypoglycemia documented.  His care attendant reports he has been drinking more sodas recently.    - Jemarion Roycroft has currently uncontrolled symptomatic type 2 DM since 58 years of age.   -Recent labs reviewed.    - I had a long discussion with him about the progressive nature of diabetes and the pathology behind its complications. -his diabetes is complicated by CHF, CVA, retinopathy, CKD stage 3a and he remains at a high risk for more acute and chronic complications which include CAD, CVA, CKD, retinopathy, and neuropathy. These are all discussed in detail with him.  The following Lifestyle Medicine recommendations according to American College of Lifestyle Medicine [facility]) were discussed and offered to patient and he agrees to start the journey:  A. Whole Foods, Plant-based plate comprising of fruits and vegetables, plant-based proteins, whole-grain carbohydrates was discussed in detail with the patient.   A list for source of those nutrients were also provided to the patient.  Patient will use only water or unsweetened tea for hydration. B.  The need to stay away from risky substances including alcohol, smoking; obtaining 7 to 9 hours of restorative sleep, at least 150 minutes of moderate intensity exercise weekly, the importance of healthy social connections,  and stress reduction techniques were discussed. C.  A full color page of  Calorie density of various food groups per pound showing examples of each food groups was provided to the patient.  - Nutritional counseling repeated/built upon at each appointment.  - The patient admits there is a room for improvement in their diet and drink choices. -  Suggestion is made for the patient to avoid simple carbohydrates from their diet including Cakes, Sweet Desserts /  Pastries, Ice Cream, Soda (diet and regular), Sweet Tea, Candies, Chips, Cookies, Sweet Pastries, Store Bought Juices, Alcohol in Excess of 1-2 drinks a day, Artificial Sweeteners, Coffee Creamer, and Sugar-free Products. This will help patient to have stable blood glucose profile and potentially avoid unintended weight gain.   - I encouraged the patient to switch to unprocessed or minimally processed complex starch and increased protein intake (animal or plant source), fruits, and vegetables.   - Patient is advised to stick to a routine mealtimes to eat 3 meals a day and avoid unnecessary snacks (to snack only to correct hypoglycemia).  - I have approached him with the following individualized plan to manage his diabetes and patient agrees:   -Will adjust his Basaglar  to 45 units SQ nightly and continue his Novolog  5-11 units TID with meals if glucose is above 90 and he is eating (Specific instructions on how to titrate insulin  dosage based on glucose readings given to patient in writing).  He can also continue Jardiance  25 mg po daily and Metformin  500 mg po twice daily for now (since kidneys are stable).  -Patient can benefit from SGLT2i from a kidney perspective as well as diabetes management.  -he is encouraged to start/continue monitoring glucose 4 times daily, before meals and before bed, and to call the clinic if he has readings less than 70 or above 300 for 3 tests in a row.  - he is warned  not to take insulin  without proper monitoring per orders. - Adjustment parameters are given to him for hypo and hyperglycemia in writing.  - he will be considered for incretin therapy as appropriate next visit.  - Specific targets for  A1c; LDL, HDL, and Triglycerides were discussed with the patient.  2) Blood Pressure /Hypertension:  his blood pressure is controlled to target.   he is advised to continue his current medications as prescribed by his PCP.  3) Lipids/Hyperlipidemia:    His recent  lipid panel shows controlled LDL of 58 but elevated triglycerides of 254.  he is advised to continue Lipitor 10 mg daily at bedtime.  Side effects and precautions discussed with him.  He is advised to avoid fried fatty foods, when able.  4)  Weight/Diet:  his Body mass index is 29.76 kg/m.  -  clearly complicating his diabetes care.   he is a candidate for mild weight loss. I discussed with him the fact that loss of 5 - 10% of his  current body weight will have the most impact on his diabetes management.  Exercise, and detailed carbohydrates information provided  -  detailed on discharge instructions.  5) Chronic Care/Health Maintenance: -he is on ACEI/ARB and Statin medications and is encouraged to initiate and continue to follow up with Ophthalmology, Dentist, Podiatrist at least yearly or according to recommendations, and advised to stay away from smoking. I have recommended yearly flu vaccine and pneumonia vaccine at least every 5 years; moderate intensity exercise for up to 150 minutes weekly; and sleep for at least 7 hours a day.  - he is advised to maintain close follow up with Barbaraann Harvey, FNP for primary care needs, as well as his other providers for optimal and coordinated care.     I spent  44  minutes in the care of the patient today including review of labs from CMP, Lipids, Thyroid Function, Hematology (current and previous including abstractions from other facilities); face-to-face time discussing  his blood glucose readings/logs, discussing hypoglycemia and hyperglycemia episodes and symptoms, medications doses, his options of short and long term treatment based on the latest standards of care / guidelines;  discussion about incorporating lifestyle medicine;  and documenting the encounter. Risk reduction counseling performed per USPSTF guidelines to reduce obesity and cardiovascular risk factors.     Please refer to Patient Instructions for Blood Glucose Monitoring and  Insulin /Medications Dosing Guide  in media tab for additional information. Please  also refer to  Patient Self Inventory in the Media  tab for reviewed elements of pertinent patient history.  Jama Gosling participated in the discussions, expressed understanding, and voiced agreement with the above plans.  All questions were answered to his satisfaction. he is encouraged to contact clinic should he have any questions or concerns prior to his return visit.     Follow up plan: - Return in about 3 months (around 02/28/2024) for Diabetes F/U with A1c in office, No previsit labs, Bring meter and logs.   Benton Rio, [facility] [address] [place], [place] [address] Phone: [phone] Fax: [phone]  11/28/2023, 3:21 PM

## 2023-12-12 ENCOUNTER — Telehealth: Payer: Self-pay

## 2023-12-12 ENCOUNTER — Telehealth: Payer: Self-pay | Admitting: Nurse Practitioner

## 2023-12-12 ENCOUNTER — Other Ambulatory Visit (HOSPITAL_COMMUNITY): Payer: Self-pay

## 2023-12-12 NOTE — Telephone Encounter (Signed)
 Pharmacy Patient Advocate Encounter   Received notification from Pt Calls Messages that prior authorization for Jardiance  25mg  is required/requested.   Insurance verification completed.   The patient is insured through [place] MEDICA[employer].   Per test claim: PA required; PA submitted to above mentioned insurance via Latent Key/confirmation #/EOC [id number] Status is pending

## 2023-12-12 NOTE — Telephone Encounter (Signed)
 Pharmacy left a VM stating they are still waiting on a PA to be completed for patient's jardiance . COVER MY MEDS KEY [id number]

## 2023-12-14 NOTE — Telephone Encounter (Signed)
 Pharmacy Patient Advocate Encounter  Received notification from [employer] [place] [employer] that Prior Authorization for Jardiance  25mg  has been DENIED.  Full denial letter will be uploaded to the media tab. See denial reason below.    I believe they are basing this on his last A1C being higher than the previous one.

## 2023-12-15 NOTE — Telephone Encounter (Signed)
 We have sent in last office note to the insurance , asking that they review and understand the uses of this medication. We ask for a reconsideration.

## 2023-12-21 ENCOUNTER — Other Ambulatory Visit: Payer: Self-pay | Admitting: Nurse Practitioner

## 2023-12-21 ENCOUNTER — Telehealth: Payer: Self-pay | Admitting: *Deleted

## 2023-12-21 NOTE — Telephone Encounter (Signed)
 [facility] was called,([facility]) and made aware that the patient's Jardiance  had been discontinued. Called and faxed order to [facility]) making them aware that the Jardiance  25 mg po daily had been discontinued. Provider will continue other diabetic medications and adjustments will be made accordingly.

## 2023-12-21 NOTE — Progress Notes (Signed)
 My office has been back and forth with patients insurance regarding denial for Jardiance  25 mg daily.  They state it has not been beneficial for diabetes management as his A1c went up.  This medication was being used to help prevent future kidney decline as a result of uncontrolled diabetes AND to help manage diabetes.  He is on multiple other medications to manage this.  The insurance agent my staff talked to was very rude.  At this time, we have decided to discontinue this medication.

## 2024-03-07 ENCOUNTER — Ambulatory Visit: Payer: MEDICAID | Admitting: Nurse Practitioner
